# Patient Record
Sex: Female | Born: 1950
Health system: Southern US, Community
[De-identification: ages and names within clinical notes are randomized; demographics above are authoritative.]

## PROBLEM LIST (undated history)

## (undated) DIAGNOSIS — F329 Major depressive disorder, single episode, unspecified: Secondary | ICD-10-CM

## (undated) DIAGNOSIS — F32A Depression, unspecified: Secondary | ICD-10-CM

## (undated) DIAGNOSIS — M797 Fibromyalgia: Secondary | ICD-10-CM

## (undated) DIAGNOSIS — M1990 Unspecified osteoarthritis, unspecified site: Secondary | ICD-10-CM

## (undated) DIAGNOSIS — G479 Sleep disorder, unspecified: Secondary | ICD-10-CM

## (undated) DIAGNOSIS — G5600 Carpal tunnel syndrome, unspecified upper limb: Secondary | ICD-10-CM

## (undated) DIAGNOSIS — E785 Hyperlipidemia, unspecified: Secondary | ICD-10-CM

## (undated) DIAGNOSIS — B0229 Other postherpetic nervous system involvement: Secondary | ICD-10-CM

## (undated) DIAGNOSIS — Z720 Tobacco use: Secondary | ICD-10-CM

## (undated) HISTORY — PX: COLONOSCOPY: SHX174

## (undated) HISTORY — DX: Tobacco use: Z72.0

## (undated) HISTORY — PX: OTHER SURGICAL HISTORY: SHX169

## (undated) HISTORY — DX: Unspecified osteoarthritis, unspecified site: M19.90

## (undated) HISTORY — DX: Sleep disorder, unspecified: G47.9

## (undated) HISTORY — DX: Carpal tunnel syndrome, unspecified upper limb: G56.00

## (undated) HISTORY — DX: Fibromyalgia: M79.7

## (undated) HISTORY — DX: Other postherpetic nervous system involvement: B02.29

---

## 1990-03-03 HISTORY — PX: ABDOMINAL HYSTERECTOMY: SHX81

## 1990-03-03 HISTORY — PX: APPENDECTOMY: SHX54

## 1990-03-03 HISTORY — PX: OTHER SURGICAL HISTORY: SHX169

## 1999-06-02 ENCOUNTER — Emergency Department (HOSPITAL_COMMUNITY): Admission: EM | Admit: 1999-06-02 | Discharge: 1999-06-02 | Payer: Self-pay | Admitting: Emergency Medicine

## 1999-06-02 ENCOUNTER — Encounter: Payer: Self-pay | Admitting: Emergency Medicine

## 2001-11-25 ENCOUNTER — Encounter: Payer: Self-pay | Admitting: Family Medicine

## 2001-11-25 ENCOUNTER — Encounter: Admission: RE | Admit: 2001-11-25 | Discharge: 2001-11-25 | Payer: Self-pay | Admitting: Family Medicine

## 2002-07-06 ENCOUNTER — Other Ambulatory Visit: Admission: RE | Admit: 2002-07-06 | Discharge: 2002-07-06 | Payer: Self-pay | Admitting: Family Medicine

## 2002-07-06 ENCOUNTER — Encounter: Payer: Self-pay | Admitting: Family Medicine

## 2002-08-05 ENCOUNTER — Ambulatory Visit (HOSPITAL_COMMUNITY): Admission: RE | Admit: 2002-08-05 | Discharge: 2002-08-05 | Payer: Self-pay | Admitting: Gastroenterology

## 2004-02-22 ENCOUNTER — Ambulatory Visit: Payer: Self-pay | Admitting: Family Medicine

## 2004-06-03 ENCOUNTER — Ambulatory Visit: Payer: Self-pay | Admitting: Family Medicine

## 2004-11-26 ENCOUNTER — Ambulatory Visit: Payer: Self-pay | Admitting: Family Medicine

## 2005-01-27 ENCOUNTER — Ambulatory Visit: Payer: Self-pay | Admitting: Family Medicine

## 2005-08-01 ENCOUNTER — Ambulatory Visit: Payer: Self-pay | Admitting: Family Medicine

## 2006-01-27 ENCOUNTER — Ambulatory Visit: Payer: Self-pay | Admitting: Family Medicine

## 2006-08-24 ENCOUNTER — Ambulatory Visit: Payer: Self-pay | Admitting: Family Medicine

## 2006-12-09 ENCOUNTER — Telehealth: Payer: Self-pay | Admitting: Family Medicine

## 2006-12-15 ENCOUNTER — Encounter: Payer: Self-pay | Admitting: Family Medicine

## 2006-12-15 DIAGNOSIS — N809 Endometriosis, unspecified: Secondary | ICD-10-CM | POA: Insufficient documentation

## 2006-12-15 DIAGNOSIS — G5600 Carpal tunnel syndrome, unspecified upper limb: Secondary | ICD-10-CM | POA: Insufficient documentation

## 2006-12-15 DIAGNOSIS — F17200 Nicotine dependence, unspecified, uncomplicated: Secondary | ICD-10-CM | POA: Insufficient documentation

## 2006-12-15 DIAGNOSIS — M797 Fibromyalgia: Secondary | ICD-10-CM | POA: Insufficient documentation

## 2006-12-15 DIAGNOSIS — B0229 Other postherpetic nervous system involvement: Secondary | ICD-10-CM | POA: Insufficient documentation

## 2006-12-15 DIAGNOSIS — Z720 Tobacco use: Secondary | ICD-10-CM | POA: Insufficient documentation

## 2006-12-15 DIAGNOSIS — M1990 Unspecified osteoarthritis, unspecified site: Secondary | ICD-10-CM | POA: Insufficient documentation

## 2007-01-13 ENCOUNTER — Telehealth: Payer: Self-pay | Admitting: Family Medicine

## 2007-01-18 ENCOUNTER — Ambulatory Visit: Payer: Self-pay | Admitting: Family Medicine

## 2007-01-18 DIAGNOSIS — G479 Sleep disorder, unspecified: Secondary | ICD-10-CM | POA: Insufficient documentation

## 2007-01-29 ENCOUNTER — Ambulatory Visit: Payer: Self-pay | Admitting: Family Medicine

## 2007-02-01 ENCOUNTER — Ambulatory Visit: Payer: Self-pay | Admitting: Family Medicine

## 2007-02-02 LAB — CONVERTED CEMR LAB
ALT: 14 units/L (ref 0–35)
BUN: 11 mg/dL (ref 6–23)
Basophils Relative: 0.9 % (ref 0.0–1.0)
Bilirubin, Direct: 0.1 mg/dL (ref 0.0–0.3)
Creatinine, Ser: 0.9 mg/dL (ref 0.4–1.2)
Direct LDL: 130 mg/dL
Eosinophils Relative: 4.6 % (ref 0.0–5.0)
GFR calc Af Amer: 83 mL/min
HCT: 42.6 % (ref 36.0–46.0)
HDL: 77.3 mg/dL (ref >39.0)
Hemoglobin: 14.6 g/dL (ref 12.0–15.0)
Lymphocytes Relative: 40.5 % (ref 12.0–46.0)
MCHC: 34.2 g/dL (ref 30.0–36.0)
MCV: 89.4 fL (ref 78.0–100.0)
Neutrophils Relative %: 47.2 % (ref 43.0–77.0)
RBC: 4.76 M/uL (ref 3.87–5.11)
RDW: 13.1 % (ref 11.5–14.6)
VLDL: 12 mg/dL (ref 0–40)
WBC: 6.2 10*3/uL (ref 4.5–10.5)

## 2007-02-08 ENCOUNTER — Ambulatory Visit: Payer: Self-pay | Admitting: Family Medicine

## 2007-03-19 ENCOUNTER — Telehealth: Payer: Self-pay | Admitting: Family Medicine

## 2007-05-11 ENCOUNTER — Ambulatory Visit: Payer: Self-pay | Admitting: Family Medicine

## 2007-05-11 LAB — CONVERTED CEMR LAB
Nitrite: NEGATIVE
pH: 6.5

## 2007-05-12 ENCOUNTER — Telehealth: Payer: Self-pay | Admitting: Family Medicine

## 2007-05-18 ENCOUNTER — Telehealth: Payer: Self-pay | Admitting: Family Medicine

## 2007-07-13 ENCOUNTER — Ambulatory Visit: Payer: Self-pay | Admitting: Family Medicine

## 2007-07-13 DIAGNOSIS — L723 Sebaceous cyst: Secondary | ICD-10-CM | POA: Insufficient documentation

## 2007-09-10 ENCOUNTER — Telehealth (INDEPENDENT_AMBULATORY_CARE_PROVIDER_SITE_OTHER): Payer: Self-pay | Admitting: *Deleted

## 2007-10-01 ENCOUNTER — Telehealth (INDEPENDENT_AMBULATORY_CARE_PROVIDER_SITE_OTHER): Payer: Self-pay | Admitting: *Deleted

## 2007-12-24 ENCOUNTER — Telehealth: Payer: Self-pay | Admitting: Family Medicine

## 2008-01-25 ENCOUNTER — Telehealth: Payer: Self-pay | Admitting: Family Medicine

## 2008-04-19 ENCOUNTER — Ambulatory Visit: Payer: Self-pay | Admitting: Family Medicine

## 2008-05-30 ENCOUNTER — Telehealth: Payer: Self-pay | Admitting: Family Medicine

## 2008-07-07 ENCOUNTER — Telehealth: Payer: Self-pay | Admitting: Family Medicine

## 2008-12-08 ENCOUNTER — Telehealth: Payer: Self-pay | Admitting: Family Medicine

## 2009-01-05 ENCOUNTER — Telehealth: Payer: Self-pay | Admitting: Family Medicine

## 2009-05-03 ENCOUNTER — Telehealth: Payer: Self-pay | Admitting: Family Medicine

## 2009-07-03 ENCOUNTER — Encounter: Payer: Self-pay | Admitting: Family Medicine

## 2009-07-06 ENCOUNTER — Telehealth: Payer: Self-pay | Admitting: Family Medicine

## 2009-07-06 ENCOUNTER — Ambulatory Visit: Payer: Self-pay | Admitting: Family Medicine

## 2009-07-06 ENCOUNTER — Encounter: Admission: RE | Admit: 2009-07-06 | Discharge: 2009-07-06 | Payer: Self-pay | Admitting: Family Medicine

## 2009-07-06 DIAGNOSIS — IMO0002 Reserved for concepts with insufficient information to code with codable children: Secondary | ICD-10-CM | POA: Insufficient documentation

## 2009-07-06 DIAGNOSIS — M25539 Pain in unspecified wrist: Secondary | ICD-10-CM | POA: Insufficient documentation

## 2009-07-06 DIAGNOSIS — M159 Polyosteoarthritis, unspecified: Secondary | ICD-10-CM | POA: Insufficient documentation

## 2009-07-06 DIAGNOSIS — M25519 Pain in unspecified shoulder: Secondary | ICD-10-CM | POA: Insufficient documentation

## 2009-07-06 IMAGING — CR DG WRIST COMPLETE 3+V*L*
3 series · 3 of 3 positions shown · non-contrast
Comparison: [HOSPITAL] at [REDACTED] [HOSPITAL] left hand
radiographs [DATE].

CLINICAL DATA: Fall injury [DATE] with medial left wrist pain
and swelling.

LEFT WRIST - COMPLETE 3+ VIEW

[view not recorded (1 of 3)]
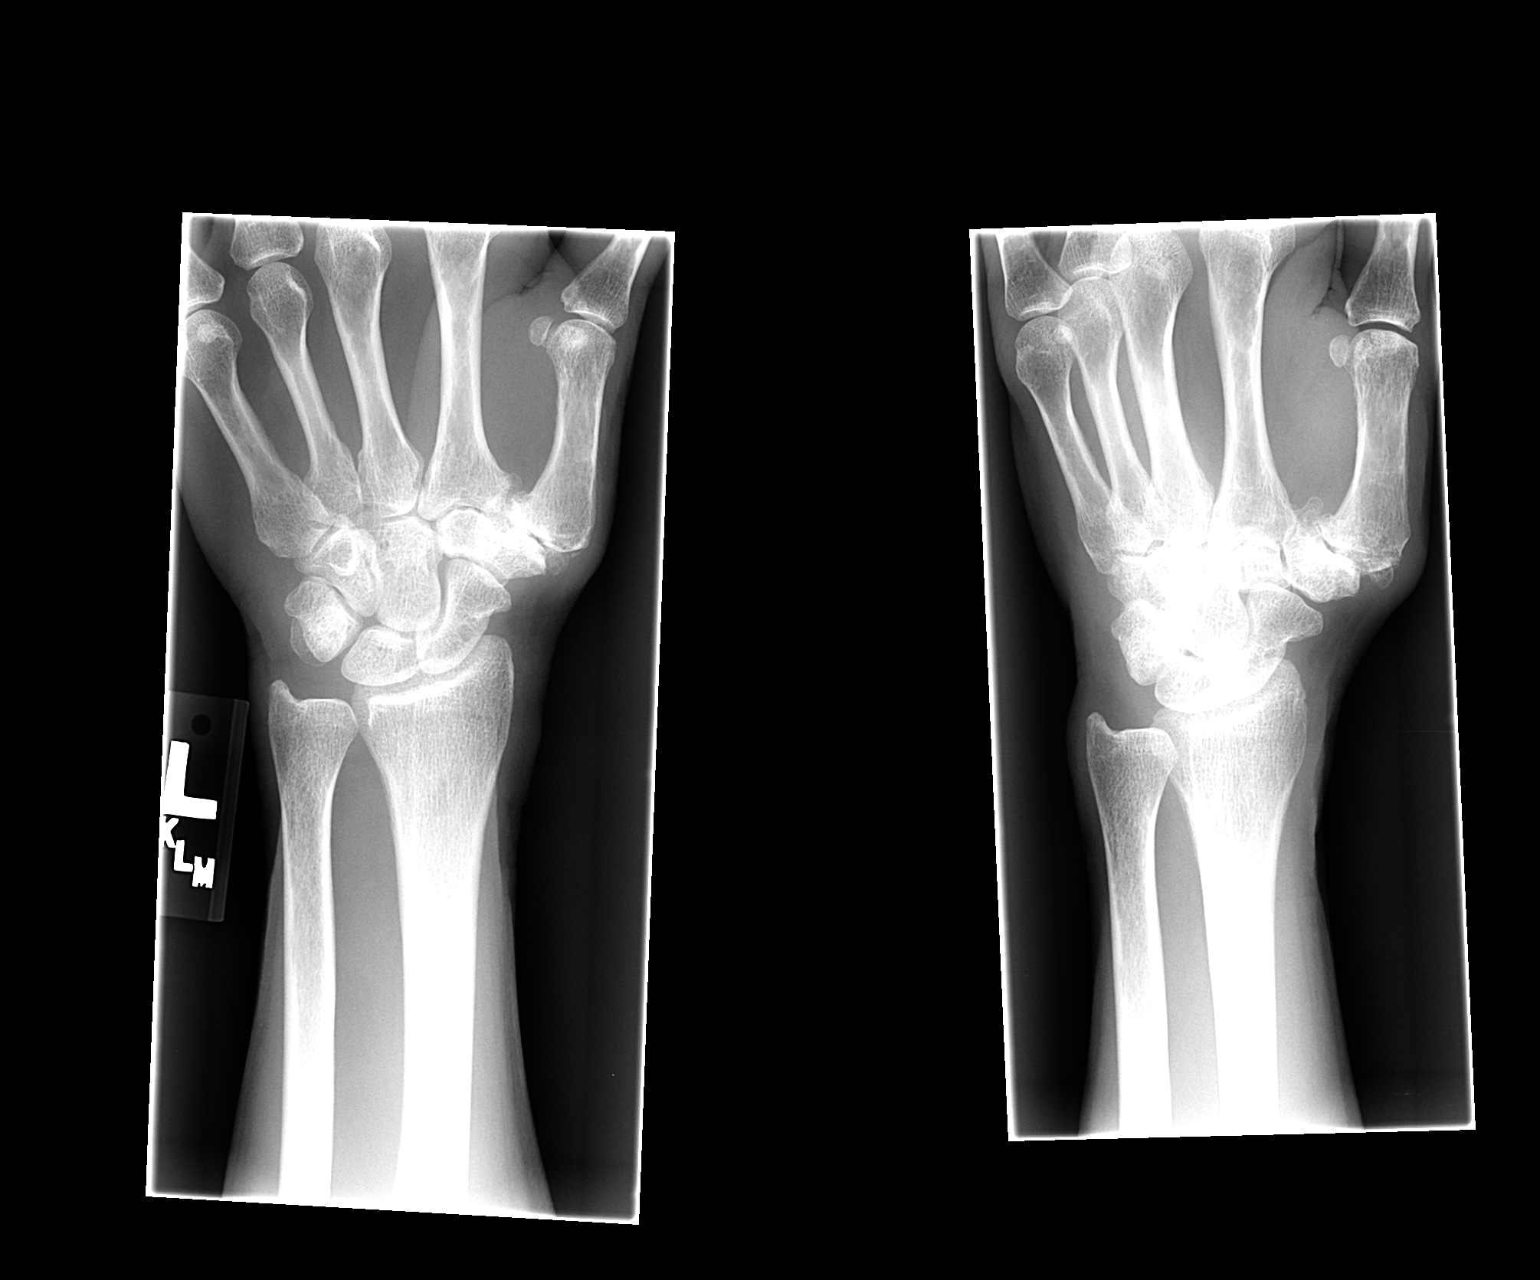

[view not recorded (2 of 3)]
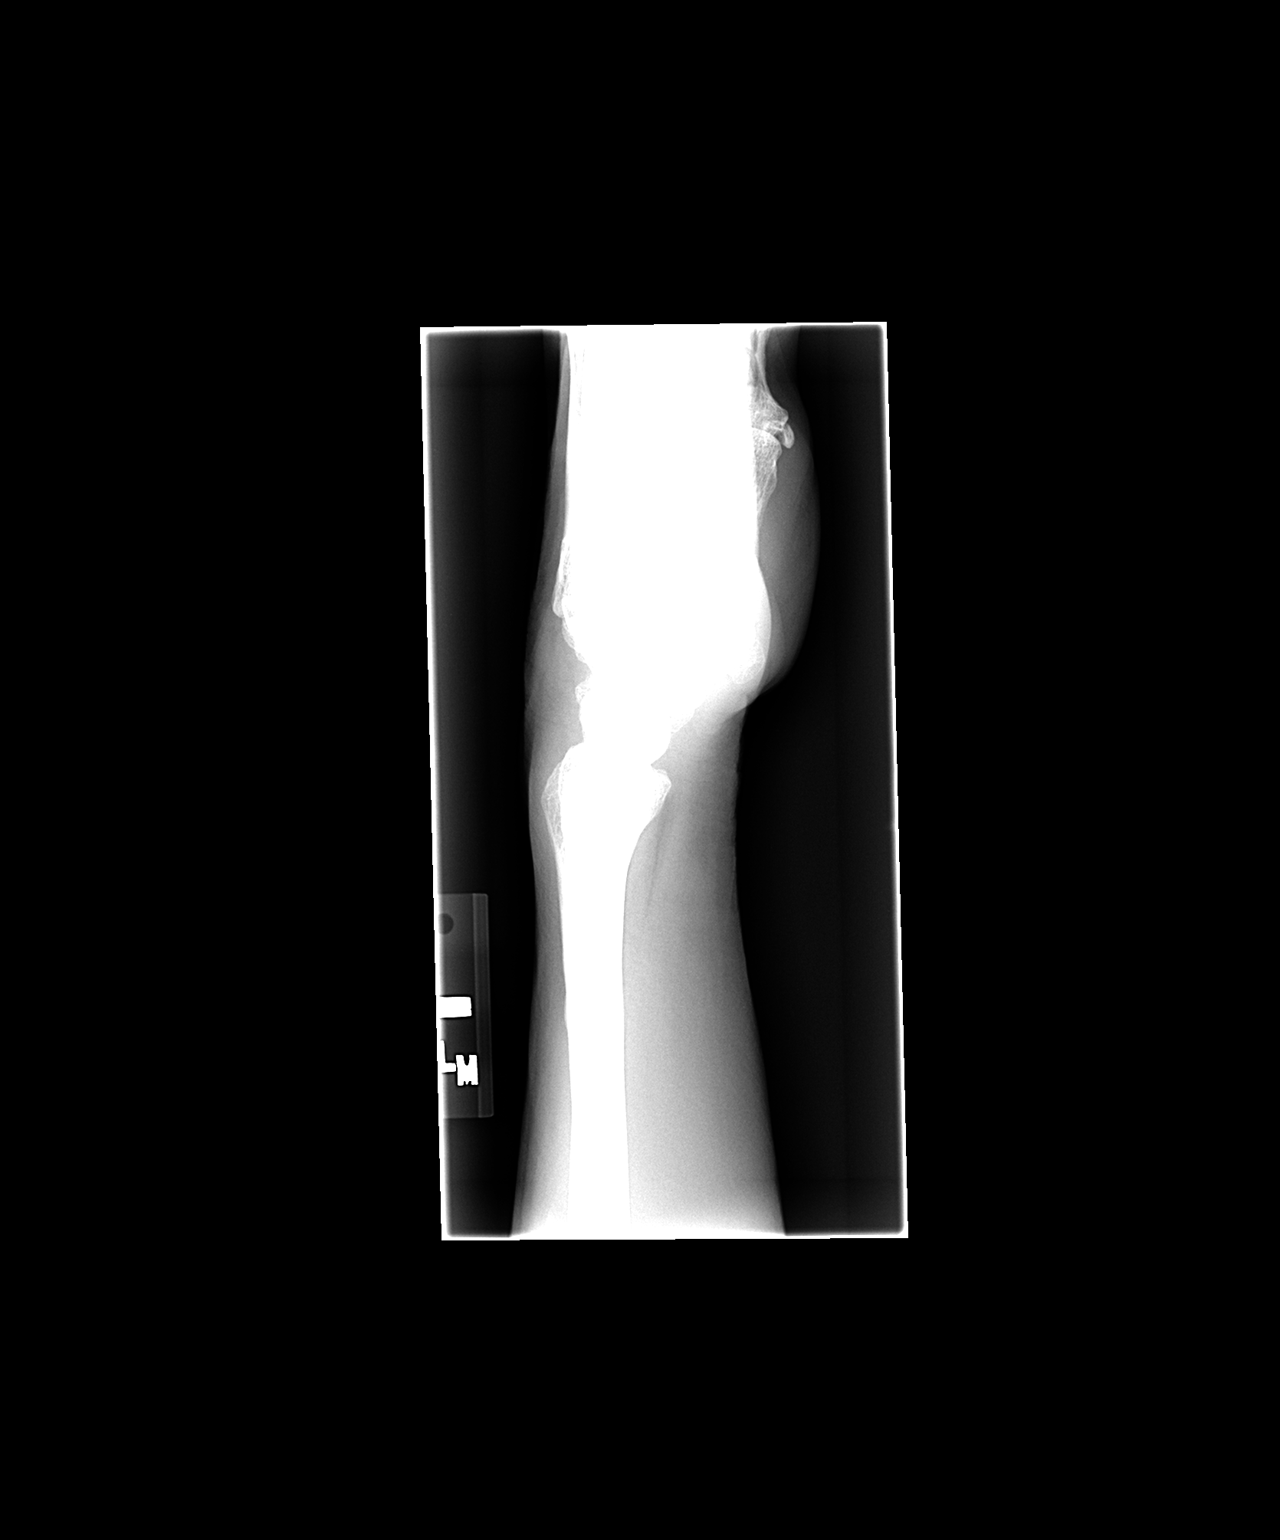

[view not recorded (3 of 3)]
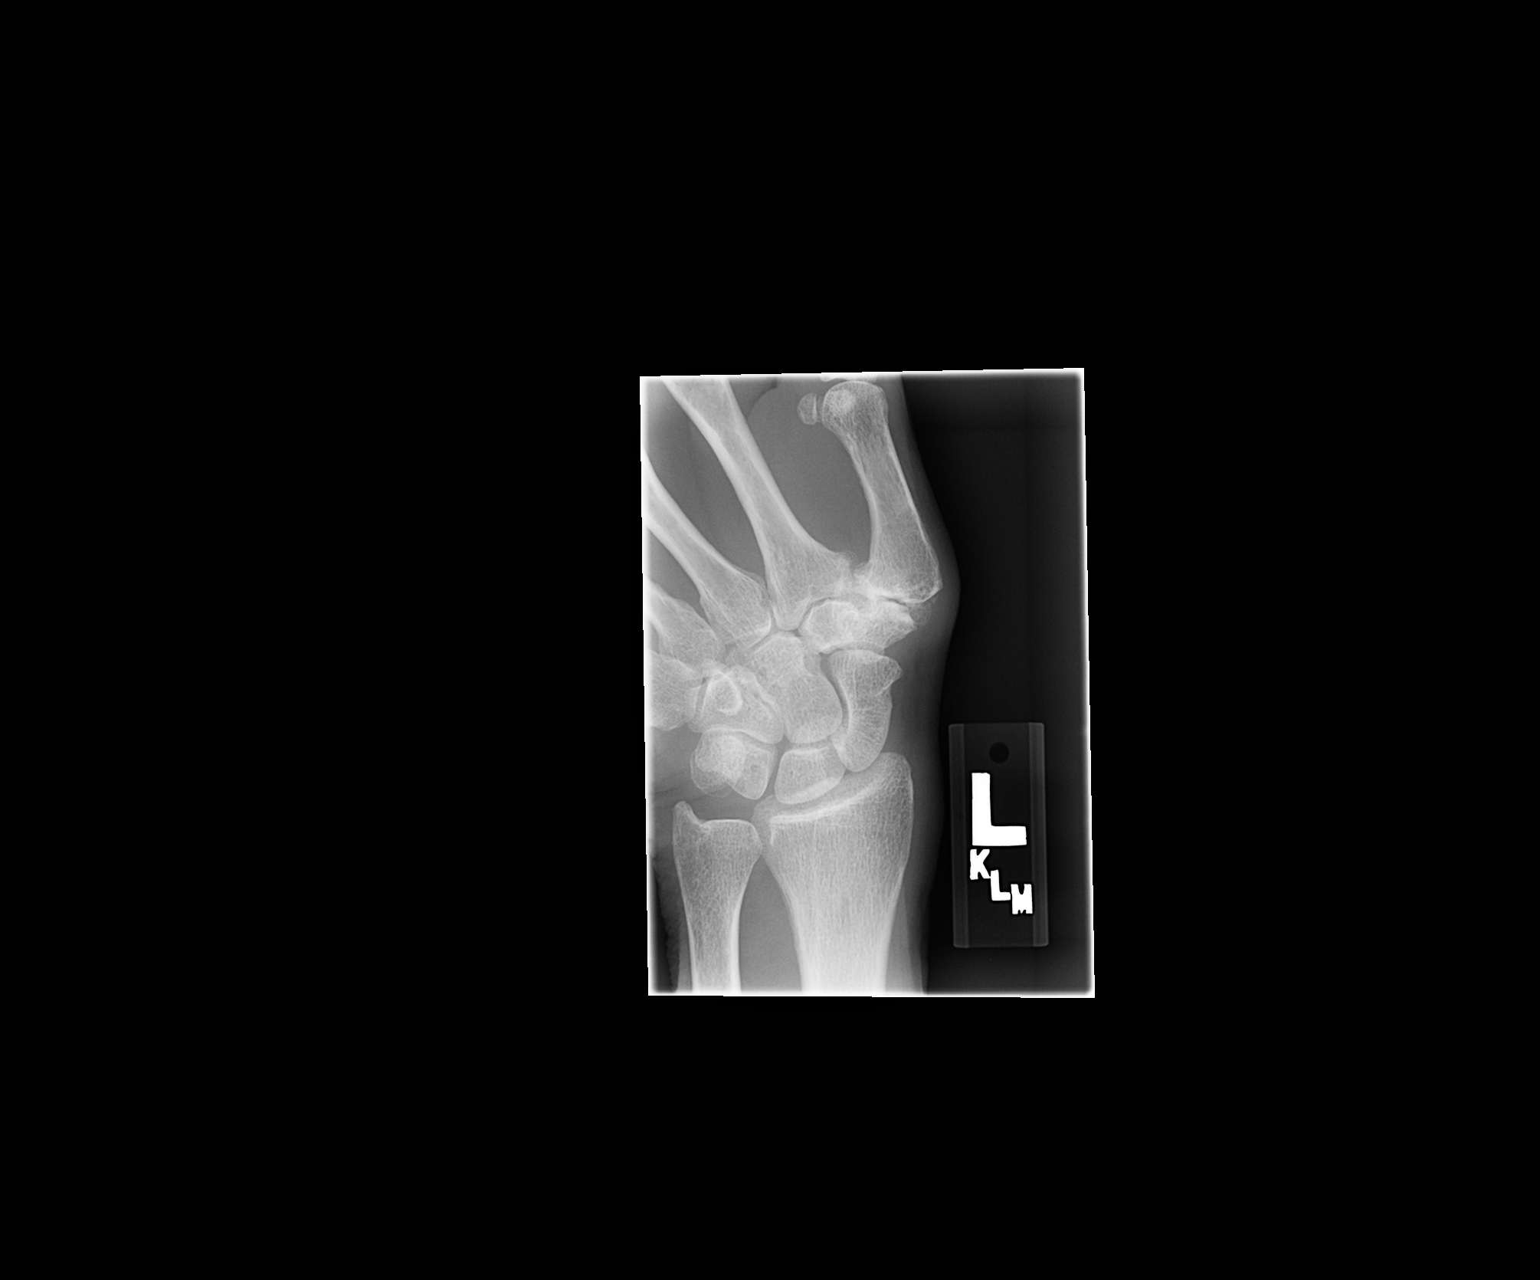

[3 of 3 positions shown; findings below may reference images not displayed]

FINDINGS: No acute fracture, subluxation, dislocation or radiopaque
foreign bodies seen.  Advanced left first metacarpal carpal
osteoarthritis findings noted.
IMPRESSION: 1.  No acute findings.
2.  Advanced left first metacarpal carpal osteoarthritis.

## 2009-08-06 ENCOUNTER — Telehealth: Payer: Self-pay | Admitting: Family Medicine

## 2009-11-21 ENCOUNTER — Ambulatory Visit: Payer: Self-pay | Admitting: Family Medicine

## 2009-11-21 DIAGNOSIS — R634 Abnormal weight loss: Secondary | ICD-10-CM | POA: Insufficient documentation

## 2010-01-18 ENCOUNTER — Telehealth: Payer: Self-pay | Admitting: Family Medicine

## 2010-02-18 ENCOUNTER — Telehealth: Payer: Self-pay | Admitting: Family Medicine

## 2010-03-12 ENCOUNTER — Telehealth: Payer: Self-pay | Admitting: Family Medicine

## 2010-03-22 ENCOUNTER — Ambulatory Visit: Admit: 2010-03-22 | Payer: Self-pay | Admitting: Family Medicine

## 2010-04-02 NOTE — Progress Notes (Signed)
Summary: refill request for ultram  Phone Note Refill Request Message from:  Fax from Pharmacy  Refills Requested: Medication #1:  ULTRAM ER 300 MG  TB24 Take 1 tablet by mouth once a day prn   Last Refilled: 12/20/2009 Faxed request from [facility].  Initial call taken by: Lowella Petties CMA, AAMA,  January 18, 2010 12:32 PM  Follow-up for Phone Call        px written on EMR for call in  Follow-up by: Judith Part MD,  January 18, 2010 1:02 PM  Additional Follow-up for Phone Call Additional follow up Details #1::        Medication phoned to [facility] pharmacy as instructed. [name] LPN  January 18, 2010 2:32 PM     New/Updated Medications: ULTRAM ER 300 MG  TB24 (TRAMADOL HCL) Take 1 tablet by mouth once a day prn Prescriptions: ULTRAM ER 300 MG  TB24 (TRAMADOL HCL) Take 1 tablet by mouth once a day prn  #30 x 5   Entered and Authorized by:   Judith Part MD   Signed by:   [name] LPN on 16/12/9602   Method used:   Telephoned to ...       [facility]* (retail)       [address]       [place], [place]  [address]       Ph: [phone]       Fax: [phone]   RxID:   [id number]

## 2010-04-02 NOTE — Assessment & Plan Note (Signed)
Summary: COLD OR ALLERGY??? CHEST IS HURTING??? COUGHING UP YELLOW MUC...   Vital Signs:  Patient profile:   60 year old female Height:      65 inches Weight:      126.50 pounds BMI:     21.13 Temp:     98.5 degrees F oral Pulse rate:   72 / minute Pulse rhythm:   regular BP sitting:   110 / 80  (left arm) Cuff size:   regular  Vitals Entered By: [name] CMA Duncan Dull) (November 21, 2009 2:01 PM) CC: ? cold, allergies, congestion   History of Present Illness: sick for about 2 weeks or more  started with flu like symptoms - but without fever (just achey)  then started coughing yellow phlegm up -- and now chest burns and hurts  taking mucinex day/ mucinex DM at night -- for 2 weeks some wheeze at times  real junky sounding cough   some stuffy nose throat is sore  ears are ok    is still smoking - but less than she used to 1/2 ppd    ? fibro kicking up - is sore all over   some nausea no vom or diarrhea   occ gets a sharp pain in R hand and it goes numb for a few seconds   lost 10 lb since june - thinks it is from stress - not eating as much  husb has prostate cancer - this is stable now in remission it had spread to his bladder   Allergies: 1)  ! Tylenol  Past History:  Past Medical History: Last updated: 07/06/2009 Osteoarthritis fibromyalgia endometriosis tab abuse  post herpetic neuralgia  sleep disorder carpal tunnel  Past Surgical History: Last updated: December 18, 2006 Hysterectomy- total, endometriosis (1992) Appendectomy (1992) Bladder tack Colonoscopy- anal polyps, melanosis coli  Family History: Last updated: 12-18-2006 Father: deceased- MI age 87 Mother: deceased- MI age 73 Siblings: 6 brothers- 1 with crippling arthritis, 4 sisters- 1 with crippling arthritis  Social History: Last updated: 11/21/2009 Marital Status: Married Children:  Occupation: [employer]--caf[employer]ia  3 and 1/2 hrs/d husb has prostate ca in remision    smokes 1/2ppd   Risk Factors: Smoking Status: current (18-Dec-2006) Packs/Day: 1 pack every 2-3d (04/19/2008)  Social History: Marital Status: Married Children:  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 Eyes:  Denies blurring and eye irritation. ENT:  Complains of nasal congestion, sinus pressure, and sore throat; denies earache. CV:  Denies chest pain or discomfort and palpitations. Resp:  Complains of cough, sputum productive, and wheezing. GI:  Denies diarrhea, nausea, and vomiting. Derm:  Denies itching, lesion(s), poor wound healing, and rash. Heme:  Denies abnormal bruising and bleeding.  Physical Exam  General:  Well-developed,well-nourished,in no acute distress; alert,appropriate and cooperative throughout examination Head:  normocephalic, atraumatic, and no abnormalities observed.  no sinus tenderness Eyes:  vision grossly intact, pupils equal, pupils round, pupils reactive to light, and no injection.   Ears:  R ear normal and L ear normal.   Nose:  nares are injected and congested bilaterally  Mouth:  pharynx pink and moist, no erythema, and no exudates.   Neck:  No deformities, masses, or tenderness noted. Chest Wall:  some mild ant chest wall tenderness  Lungs:  CTA with distant bs that are harsh at bases  scant occas rhonchi no wheeze  except on forced exp Heart:  Normal rate and regular rhythm. S1 and S2 normal without gallop, murmur, click, rub or other extra sounds. Abdomen:  Bowel sounds positive,abdomen soft and non-tender without masses, organomegaly or hernias noted. Skin:  Intact without suspicious lesions or rashes Cervical Nodes:  No lymphadenopathy noted Psych:  normal affect, talkative and pleasant    Impression & Recommendations:  Problem # 1:  BRONCHITIS, ACUTE (ICD-466.0) Assessment New for over 2 wk  with prod cough in smoker thankfully - reactive airways are mild cover with levaquin and get cxr  recommend sympt care- see pt instructions   Her updated medication list for this problem includes:    Levaquin 500 Mg Tabs (Levofloxacin) .Marland Kitchen... 1 by mouth once daily for 10 days    Guaifenesin-codeine 100-10 Mg/26ml Soln (Guaifenesin-codeine) .Marland Kitchen... 1-2 teaspoons by mouth up to every 4-6 hours as needed cough  watch for sedation    Ventolin Hfa 108 (90 Base) Mcg/act Aers (Albuterol sulfate) .Marland Kitchen... 2 puffs up to every 4 hours as needed wheeze  Orders: T-2 View CXR (71020TC)  Problem # 2:  WEIGHT LOSS, RECENT (ICD-783.21) per pt from stress in light of this and smoking- cxr today Orders: T-2 View CXR (71020TC)  Complete Medication List: 1)  Lyrica 75 Mg Caps (Pregabalin) .... Take 1 capsule by mouth once a day 2)  Remifemin  .... Take 1 tablet by mouth once a day 3)  Cymbalta 60 Mg Cpep (Duloxetine hcl) .... Take 1 tablet by mouth once a day 4)  Ultram Er 300 Mg Tb24 (Tramadol hcl) .... Take 1 tablet by mouth once a day prn 5)  Adult Aspirin Low Strength 81 Mg Tbdp (Aspirin) .... Take 1 tablet by mouth once a day 6)  Levaquin 500 Mg Tabs (Levofloxacin) .Marland Kitchen.. 1 by mouth once daily for 10 days 7)  Guaifenesin-codeine 100-10 Mg/91ml Soln (Guaifenesin-codeine) .Marland Kitchen.. 1-2 teaspoons by mouth up to every 4-6 hours as needed cough  watch for sedation 8)  Ventolin Hfa 108 (90 Base) Mcg/act Aers (Albuterol sulfate) .... 2 puffs up to every 4 hours as needed wheeze  Patient Instructions: 1)  chest x ray today  2)  work on quitting smoking  3)  take the levaquin for bronchitis 4)  cough syrup is sedating - use caution  5)  inhaler as needed 6)  please update me if worse or not improved next week  Prescriptions: VENTOLIN HFA 108 (90 BASE) MCG/ACT AERS (ALBUTEROL SULFATE) 2 puffs up to every 4 hours as needed wheeze  #1 mdi x 1   Entered and Authorized by:   Judith Part MD   Signed by:   Judith Part MD on 11/21/2009   Method used:   Print then Give to Patient   RxID:   [id number] GUAIFENESIN-CODEINE 100-10 MG/5ML SOLN (GUAIFENESIN-CODEINE) 1-2 teaspoons by mouth up to every 4-6 hours as needed cough  watch for sedation  #120cc x 0   Entered and Authorized by:   Judith Part MD   Signed by:   Judith Part MD on 11/21/2009   Method used:   Print then Give to Patient   RxID:   [id number] LEVAQUIN 500 MG TABS (LEVOFLOXACIN) 1 by mouth once daily for 10 days  #10 x 0   Entered and Authorized by:   Judith Part MD   Signed by:   Judith Part MD on 11/21/2009   Method used:   Print then Give to Patient  RxID:   [id number]   Current Allergies (reviewed today): ! TYLENOL

## 2010-04-02 NOTE — Progress Notes (Signed)
Summary: refill requests for lyrica, ultram  Phone Note Refill Request Message from:  Fax from Pharmacy  Refills Requested: Medication #1:  ULTRAM ER 300 MG  TB24 Take 1 tablet by mouth once a day prn   Last Refilled: 07/06/2009  Medication #2:  LYRICA 75 MG CAPS Take 1 capsule by mouth once a day   Last Refilled: 07/06/2009 Faxed requests from [facility], [phone].  Initial call taken by: Lowella Petties CMA,  August 06, 2009 2:20 PM  Follow-up for Phone Call        controlled substance. Dr. Milinda Antis back in a few days.  per our current plan of care with controlled substances, will forward to Dr. Milinda Antis.  Follow-up by: Hannah Beat MD,  August 06, 2009 2:29 PM  Additional Follow-up for Phone Call Additional follow up Details #1::        px written on EMR for call in  Medication phoned to pharmacy.   Additional Follow-up by: Delilah Shan CMA (AAMA),  August 07, 2009 11:11 AM    New/Updated Medications: LYRICA 75 MG CAPS (PREGABALIN) Take 1 capsule by mouth once a day ULTRAM ER 300 MG  TB24 (TRAMADOL HCL) Take 1 tablet by mouth once a day prn Prescriptions: ULTRAM ER 300 MG  TB24 (TRAMADOL HCL) Take 1 tablet by mouth once a day prn  #30 x 5   Entered by:   Delilah Shan CMA (AAMA)   Authorized by:   Judith Part MD   Signed by:   Delilah Shan CMA (AAMA) on 08/07/2009   Method used:   Telephoned to ...       [facility]* (retail)       [address]       [place], [place]  [address]       Ph: [phone]       Fax: [phone]   RxID:   [id number] LYRICA 75 MG CAPS (PREGABALIN) Take 1 capsule by mouth once a day  #30 x 5   Entered by:   Delilah Shan CMA (AAMA)   Authorized by:   Judith Part MD   Signed by:   Delilah Shan CMA (AAMA) on 08/07/2009   Method used:   Telephoned to ...       [facility]* (retail)       [address]       [place], [place]  [address]       Ph: [phone]       Fax: [phone]   RxID:   [id number] ULTRAM ER 300 MG  TB24  (TRAMADOL HCL) Take 1 tablet by mouth once a day prn  #30 x 5   Entered and Authorized by:   Judith Part MD   Signed by:   Judith Part MD on 08/07/2009   Method used:   Telephoned to ...       [facility]* (retail)       [address]       [place], [place]  [address]       Ph: [phone]       Fax: [phone]   RxID:   [id number] LYRICA 75 MG CAPS (PREGABALIN) Take 1 capsule by mouth once a day  #30 x 5   Entered and Authorized by:   Judith Part MD   Signed by:   Judith Part MD on 08/07/2009   Method used:   Telephoned to ...       [facility]* (retail)  [address] [place], [place]  [address]       Ph: [phone]       Fax: [phone]   RxID:   [id number]

## 2010-04-02 NOTE — Progress Notes (Signed)
Summary: Rx Ultram & Lyrica  Phone Note Refill Request Call back at [phone] Message from:  [facility] on May 03, 2009 2:46 PM  Refills Requested: Medication #1:  LYRICA 75 MG CAPS Take 1 capsule by mouth once a day   Last Refilled: 03/15/2009  Medication #2:  Janean Sark ER 300 MG  TB24 Take 1 tablet by mouth once a day prn   Last Refilled: 01/05/2009 Received faxed refill request, please advise   Method Requested: Telephone to Pharmacy Initial call taken by: [name] CMA Duncan Dull),  May 03, 2009 2:46 PM  Follow-up for Phone Call        please sched appt with me to check in this spring Follow-up by: Judith Part MD,  May 03, 2009 3:45 PM  Additional Follow-up for Phone Call Additional follow up Details #1::        Unable to reach pt by phone to schedule appt. Left message at [facility] for pt to call for appt.Medication phoned to [facility]  pharmacy as instructed. [name] LPN  May 04, 4538 3:53 PM     New/Updated Medications: LYRICA 75 MG CAPS (PREGABALIN) Take 1 capsule by mouth once a day Prescriptions: ULTRAM ER 300 MG  TB24 (TRAMADOL HCL) Take 1 tablet by mouth once a day prn  #30 x 2   Entered and Authorized by:   Judith Part MD   Signed by:   [name] LPN on 98/01/9146   Method used:   Telephoned to ...       [facility]* (retail)       [address]       [place], [place]  [address]       Ph: [phone]       Fax: [phone]   RxID:   [id number] LYRICA 75 MG CAPS (PREGABALIN) Take 1 capsule by mouth once a day  #30 x 2   Entered and Authorized by:   Judith Part MD   Signed by:   [name] LPN on 03/47/4259   Method used:   Telephoned to ...       [facility]* (retail)       [address]       [place], [place]  [address]       Ph: [phone]       Fax: [phone]   RxID:   [id number]   Appended Document: Rx Ultram & Lyrica [name] from [facility] called and said pt had been getting Lyrica #60 taking one capsule two times a day.   Our records indicate all refills have been #30 taking one daily. I spoke with pt and she said she takes one daily. I called [name] and she said it looked like she had been getting #60 and filling every 2 months. Rx was left as #30 taking one daily. This message is for your info and approval. Thank you.

## 2010-04-02 NOTE — Progress Notes (Signed)
Summary: cell phone number  Phone Note Call from Patient Call back at Home Phone [phone]   Caller: Patient Call For: Judith Part MD Summary of Call: Patient called asking about the x-rays that were done this morning. Please advise. Initial call taken by: Melody Comas,  Jul 06, 2009 11:39 AM  Follow-up for Phone Call        thankfully - no fractures seen on x rays but she does have some arthritis use some ice as we discussed over the weekend and update me next week if pain and swelling is not improved further  worst arthritis is in L wrist and hand -- so if stiffness starts to worsen may want to have her see a specialist in the future   Follow-up by: Judith Part MD,  Jul 06, 2009 4:07 PM  Additional Follow-up for Phone Call Additional follow up Details #1::        Patient Advised.  Additional Follow-up by: Delilah Shan CMA Duncan Dull),  Jul 06, 2009 5:05 PM

## 2010-04-02 NOTE — Assessment & Plan Note (Signed)
Summary: FELL AT WORK, CUT ON NOSE AND WRIST HURT/DLO   Vital Signs:  Patient profile:   60 year old female Height:      65 inches Weight:      136.25 pounds BMI:     22.76 Temp:     98.2 degrees F oral Pulse rate:   64 / minute Pulse rhythm:   regular BP sitting:   132 / 76  (left arm) Cuff size:   regular  Vitals Entered By: [name] LPN (Jul 06, 1608 9:39 AM) CC: fell at work on 07/03/09 Seen at urgent care on [address],abrasion to rt shoulder, nose and left wrist hurts and rt hand hurts and h/a since fall   History of Present Illness: was at work - taking recyclables out -- missed back step was holding something and could not catch herself well  waist up took the biggest bit of the fall   pain is around R 1st mcp  also L wrist - ulnar side   also R shoulder  abrasion on nose with pain too  went to UC immediately -- on [address]  that was [employer] comp  doctor looked her over  x rayed thenose -- and -- that was read by radiologist  no other x rays  no stiches  gave her a muscle relaxer -- flexeril and that did not help   is achey all over  took ES excedrin and that helps some  has a mild headache ever since (hitting bridge of nose )  pressure on her nose to bend over  no nosebleed    Allergies: 1)  ! 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 Eyes:  Denies blurring and eye irritation. CV:  Denies chest pain or discomfort, palpitations, and shortness of breath with exertion. Resp:  Denies cough. GI:  Denies abdominal pain, bloody stools, and nausea. GU:  Denies dysuria and hematuria. MS:  Complains of joint pain, joint swelling, and stiffness; denies joint redness and muscle weakness. Neuro:  Complains of falling down and headaches; denies numbness and tingling; headache is mild and improving. Psych:  mood is ok. Endo:  Denies cold intolerance, excessive thirst, excessive urination, and heat intolerance. Heme:  Denies bleeding.  Physical Exam  General:  well appearing with abrasions Head:  abrasions over nose - with tenderness no other bony or sinus tenderness Eyes:  vision grossly intact, pupils equal, pupils round, and pupils reactive to light.   Nose:  no nasal discharge.  - good air exch Mouth:  pharynx pink and moist.   tougue is not bitten Neck:  No deformities, masses, or tenderness noted. nl rom without bony tenderness Chest Wall:  No deformities, masses, or tenderness noted. Lungs:  diffusely distant bs  Heart:  Normal rate and regular rhythm. S1 and S2 normal without gallop, murmur,  click, rub or other extra sounds. Abdomen:  Bowel sounds positive,abdomen soft and non-tender without masses, organomegaly or hernias noted. Msk:  R shoulder - abrasion without swelling  pain to abd over 90deg  no apprehension  minimal acromion tenderness   R hand - tenderness and swelling over 1st mcp and lateral/ dorsal hand  nl rom of fingers   L wrist - tender over distal ulna without swelling  pain on movement of wrist in all directions -but rom is full  nl grip Pulses:  R and L carotid,radial,femoral,dorsalis pedis and  posterior tibial pulses are full and equal bilaterally Neurologic:  strength normal in all extremities, sensation intact to light touch, and DTRs symmetrical and normal.   Skin:  abrasion healing R shoulder with granulation tissue and scab 2 cm oval and no s/s/ of infection  well healing scabs on bridge of nose  Cervical Nodes:  No lymphadenopathy noted Inguinal Nodes:  No significant adenopathy Psych:  normal affect, talkative and pleasant - in good spirits despite injuries   Impression & Recommendations:  Problem # 1:  SHOULDER PAIN, RIGHT (ICD-719.41) Assessment New after a fall with limited abduction and abrasion  x ray  disc pain control ice and relative rest  update with x ray The following medications were removed from the medication list:    Vicodin 5-500 Mg Tabs (Hydrocodone-acetaminophen) .Marland Kitchen... 1 at bedtime as needed cough by mouth Her updated medication list for this problem includes:    Ultram Er 300 Mg Tb24 (Tramadol hcl) .Marland Kitchen... Take 1 tablet by mouth once a day prn    Adult Aspirin Low Strength 81 Mg Tbdp (Aspirin) .Marland Kitchen... Take 1 tablet by mouth once a day  Orders: Radiology Referral (Radiology)  Problem # 2:  HAND PAIN, BILATERAL (ICD-729.5) Assessment: New after trauma- some swelling and pain with grip  xrays and update  ice and relative rest  Orders: Radiology Referral (Radiology)  Problem # 3:  WRIST PAIN, BILATERAL (WJX-914.78) Assessment: New after fall - with swelling and somewhat limited rom/ some lat pt tenderness x rays and update  pain control is adequate disc ice and elevation Orders: Radiology Referral (Radiology)  Problem # 4:  ABRASION, FACE (ICD-910.0) Assessment: New healing/ as well as one on shoulder recommend sympt care- see pt instructions   update if signs of infx/ reviewed  Td updated   Complete Medication List: 1)  Lyrica 75 Mg Caps (Pregabalin) .... Take 1 capsule by mouth once a day 2)  Remifemin  .... Take 1 tablet by mouth  once a day 3)  Cymbalta 60 Mg Cpep (Duloxetine hcl) .... Take 1 tablet by mouth once a day 4)  Ultram Er 300 Mg Tb24 (Tramadol hcl) .... Take 1 tablet by mouth once a day prn 5)  Adult Aspirin Low Strength 81 Mg Tbdp (Aspirin) .... Take 1 tablet by mouth once a day  Patient Instructions: 1)  use triple antibiotic ointment or polysporin on abrasions- they look good  2)  use antibacterial soap and water for abrasions  3)  we will send you for x rays when you check out  4)  use ice on the sore areas  5)  continue current medicines 6)  let me know if headache worsens or vomiting or dizziness -- if that occurs -- go to ER over weekend  7)  will make plan when x rays return   Current Allergies (reviewed today): ! TYLENOL   Tetanus/Td Immunization History:    Tetanus/Td # 1:  Td (03/04/2007)

## 2010-04-02 NOTE — Progress Notes (Signed)
Summary: lyrica refill  Phone Note From Pharmacy   Caller: [facility] Call For: dr Lennon Boutwell  Summary of Call: faxed refill request for lyrica Initial call taken by: Lowella Petties,  May 18, 2007 8:17 AM  Follow-up for Phone Call        px written on EMR for call in  Follow-up by: Judith Part MD,  May 18, 2007 8:26 AM  Additional Follow-up for Phone Call Additional follow up Details #1::        called to [facility] Additional Follow-up by: Lowella Petties,  May 18, 2007 10:15 AM      Prescriptions: LYRICA 75 MG CAPS (PREGABALIN) Take 1 capsule by mouth once a day  #30 x 5   Entered and Authorized by:   Judith Part MD   Signed by:   Judith Part MD on 05/18/2007   Method used:   Telephoned to ...         RxID:   [id number]

## 2010-04-04 NOTE — Progress Notes (Signed)
Summary: Cymbalta 60mg   Phone Note Refill Request Call back at [phone] Message from:  [facility] on March 12, 2010 9:37 AM  Refills Requested: Medication #1:  CYMBALTA 60 MG  CPEP Take 1 tablet by mouth once a day   Last Refilled: 02/09/2010 [facility] faxed refill for Cymbalta. Last refill 02/09/10. I did not know if pt needed an appt. i did not see labs since 2008 unless I am in error. Please advise.    Method Requested: Electronic Initial call taken by: [name] LPN,  March 12, 2010 9:39 AM  Follow-up for Phone Call        please have her sched appt for fibromyalgia check this winter or spring- thanks px written on EMR for call in  Follow-up by: Judith Part MD,  March 12, 2010 10:34 AM  Additional Follow-up for Phone Call Additional follow up Details #1::        Patient notified as instructed by telephone. Pt scheduled appt with Dr Milinda Antis 03/22/10 at 9am. Med sent electronically to [facility] (unable to get thru via phone ; line remained busy.)[name] LPN  March 12, 2010 4:59 PM     New/Updated Medications: CYMBALTA 60 MG  CPEP (DULOXETINE HCL) Take 1 tablet by mouth once a day Prescriptions: CYMBALTA 60 MG  CPEP (DULOXETINE HCL) Take 1 tablet by mouth once a day  #30 x 5   Entered by:   [name] LPN   Authorized by:   Judith Part MD   Signed by:   [name] LPN on 96/29/5284   Method used:   Electronically to        [facility]* (retail)       [address]       [place], [place]  [address]       Ph: [phone]       Fax: [phone]   RxID:   [id number] CYMBALTA 60 MG  CPEP (DULOXETINE HCL) Take 1 tablet by mouth once a day  #30 x 5   Entered and Authorized by:   Judith Part MD   Signed by:   Judith Part MD on 03/12/2010   Method used:   Electronically to        [facility]* (retail)       [address]       [place], [place]  [address]       Ph: [phone]       Fax: [phone]   RxID:   [id number]

## 2010-04-04 NOTE — Progress Notes (Signed)
Summary: lyrica   Phone Note Refill Request Message from:  Fax from Pharmacy on February 18, 2010 9:51 AM  Refills Requested: Medication #1:  LYRICA 75 MG CAPS Take 1 capsule by mouth once a day   Last Refilled: 12/20/2009 Refill request from [facility]. [phone].   Initial call taken by: Melody Comas,  February 18, 2010 9:51 AM  Follow-up for Phone Call        px written on EMR for call in  Follow-up by: Judith Part MD,  February 18, 2010 1:19 PM  Additional Follow-up for Phone Call Additional follow up Details #1::        Rx called to pharmacy Additional Follow-up by: [name] CMA Duncan Dull),  February 18, 2010 2:25 PM    New/Updated Medications: LYRICA 75 MG CAPS (PREGABALIN) Take 1 capsule by mouth once a day Prescriptions: LYRICA 75 MG CAPS (PREGABALIN) Take 1 capsule by mouth once a day  #30 x 11   Entered and Authorized by:   Judith Part MD   Signed by:   [name] CMA (AAMA) on 02/18/2010   Method used:   Telephoned to ...       [facility]* (retail)       [address]       [place], [place]  [address]       Ph: [phone]       Fax: [phone]   RxID:   [id number]

## 2010-04-23 ENCOUNTER — Encounter: Payer: Self-pay | Admitting: Family Medicine

## 2010-04-23 ENCOUNTER — Ambulatory Visit (INDEPENDENT_AMBULATORY_CARE_PROVIDER_SITE_OTHER): Payer: Self-pay | Admitting: Family Medicine

## 2010-04-23 DIAGNOSIS — J209 Acute bronchitis, unspecified: Secondary | ICD-10-CM

## 2010-04-24 ENCOUNTER — Ambulatory Visit: Payer: Self-pay | Admitting: Family Medicine

## 2010-04-26 ENCOUNTER — Telehealth: Payer: Self-pay | Admitting: Family Medicine

## 2010-04-30 NOTE — Assessment & Plan Note (Signed)
Summary: COUGH, CHEST PAIN   Vital Signs:  Patient profile:   60 year old female Weight:      128 pounds O2 Sat:      95 % on Room air Temp:     98.3 degrees F oral Pulse rate:   86 / minute Pulse rhythm:   regular BP sitting:   136 / 82  (left arm) Cuff size:   regular  Vitals Entered By: Selena Batten Dance CMA (AAMA) (April 23, 2010 10:17 AM)  O2 Flow:  Room air CC: Cough and chest is painful from coughing Comments OTC meds no help   History of Present Illness: CC: cough  6d h/o coughing productive of yellow phlegm as well as chest soreness from coughing.  Also with HA, body aches, feeling poorly.  + ST.  Has tried nyquil, delsym, excedrin and mucinex.  No sinus congestion, nasal congestion, abd pain, n/v/d, rashes, myalgia/arthralgia, fevers/chills.  no ear pain, tooth pain.  h/o fibromyalgia.  No sick contacts at home.    Pt smoking about 1/3 ppd but down since feeling ill.  Current Medications (verified): 1)  Lyrica 75 Mg Caps (Pregabalin) .... Take 1 Capsule By Mouth Once A Day 2)  Remifemin .... Take 1 Tablet By Mouth Once A Day 3)  Cymbalta 60 Mg  Cpep (Duloxetine Hcl) .... Take 1 Tablet By Mouth Once A Day 4)  Ultram Er 300 Mg  Tb24 (Tramadol Hcl) .... Take 1 Tablet By Mouth Once A Day Prn 5)  Adult Aspirin Low Strength 81 Mg  Tbdp (Aspirin) .... Take 1 Tablet By Mouth Once A Day 6)  Ventolin Hfa 108 (90 Base) Mcg/act Aers (Albuterol Sulfate) .... 2 Puffs Up To Every 4 Hours As Needed Wheeze  Allergies: 1)  ! Tylenol  Past History:  Past Medical History: Last updated: 07/06/2009 Osteoarthritis fibromyalgia endometriosis tab abuse  post herpetic neuralgia  sleep disorder carpal tunnel  Social History: Last updated: 11/21/2009 Marital Status: Married Children:  Occupation: Theatre manager  3 and 1/2 hrs/d husb has prostate ca in remision  smokes 1/2ppd   Review of Systems       per HPI  Physical Exam  General:   Well-developed,well-nourished,in no acute distress; alert,appropriate and cooperative throughout examination Head:  normocephalic, atraumatic, and no abnormalities observed.  + frontal sinus tenderness Eyes:  vision grossly intact, pupils equal, pupils round, pupils reactive to light, and no injection.   Ears:  R ear normal and L ear normal.   Nose:  nares are injected and congested bilaterally  Mouth:  pharynx pink and moist, no erythema, and no exudates.   Neck:  No deformities, masses, or tenderness noted.  no LAD Lungs:  CTA with distant bs that are harsh at bases  scant occas rhonchi Heart:  Normal rate and regular rhythm. S1 and S2 normal without gallop, murmur, click, rub or other extra sounds. Pulses:  2+ rad pulses Extremities:  no pedal edema   Impression & Recommendations:  Problem # 1:  BRONCHITIS, ACUTE (ICD-466.0) early on.  discussed if not improving or worsening to call us for abx course.  cheratussin for cough at night.  discussed expected improvement as likely viral.  advised push fluids and plenty of rest.  rec cut back smoking, rec continued use albuterol.  The following medications were removed from the medication list:    Levaquin 500 Mg Tabs (Levofloxacin) .Marland Kitchen... 1 by mouth once daily for 10 days    Guaifenesin-codeine 100-10 Mg/47ml Soln (Guaifenesin-codeine) .Marland KitchenMarland KitchenMarland KitchenMarland Kitchen  1-2 teaspoons by mouth up to every 4-6 hours as needed cough  watch for sedation Her updated medication list for this problem includes:    Ventolin Hfa 108 (90 Base) Mcg/act Aers (Albuterol sulfate) .Marland Kitchen... 2 puffs up to every 4 hours as needed wheeze    Cheratussin Ac 100-10 Mg/83ml Syrp (Guaifenesin-codeine) .Marland Kitchen... Take one teaspoon three times a day prn cough, sedation precautions  Complete Medication List: 1)  Lyrica 75 Mg Caps (Pregabalin) .... Take 1 capsule by mouth once a day 2)  Remifemin  .... Take 1 tablet by mouth once a day 3)  Cymbalta 60 Mg Cpep (Duloxetine hcl) .... Take 1 tablet by mouth once  a day 4)  Ultram Er 300 Mg Tb24 (Tramadol hcl) .... Take 1 tablet by mouth once a day prn 5)  Adult Aspirin Low Strength 81 Mg Tbdp (Aspirin) .... Take 1 tablet by mouth once a day 6)  Ventolin Hfa 108 (90 Base) Mcg/act Aers (Albuterol sulfate) .... 2 puffs up to every 4 hours as needed wheeze 7)  Cheratussin Ac 100-10 Mg/58ml Syrp (Guaifenesin-codeine) .... Take one teaspoon three times a day prn cough, sedation precautions  Patient Instructions: 1)  Sounds like you have a bronchitis.  Likely viral. 2)  Antibiotics are not needed for this.  Viral infections usually take 7-10 days to resolve.  The cough can last 4 weeks to go away. 3)  Use medication as prescribed: cheratussin for cough at night.   4)  Push fluids and plenty of rest. 5)  Keep taking mucinex during the day. 6)  Please return if you are not improving as expected, or if you have high fevers (>101.5) or worsening breathing or coughing. 7)  Call us at end of week if not feeling better. 8)  Call clinic with questions.  Pleasure to see you today Prescriptions: CHERATUSSIN AC 100-10 MG/5ML SYRP (GUAIFENESIN-CODEINE) take one teaspoon three times a day prn cough, sedation precautions  #100cc x 0   Entered and Authorized by:   Eustaquio Boyden  MD   Signed by:   Eustaquio Boyden  MD on 04/23/2010   Method used:   Print then Give to Patient   RxID:   [id number]    Orders Added: 1)  Est. Patient Level III [14782]    Current Allergies (reviewed today): ! TYLENOL

## 2010-04-30 NOTE — Progress Notes (Signed)
Summary: wants antibiotic  Phone Note Call from Patient Call back at Home Phone [phone] [facility]     Caller: Patient Summary of Call: Pt was seen on 2/21 for bronchitis.  She is still coughing up yellow green mucous.  She is asking if she can have a z-pack called to [place].  No fever. Initial call taken by: Lowella Petties CMA, AAMA,  April 26, 2010 2:35 PM  Follow-up for Phone Call        may call in.  going on 10+ days, in smoker.  cover infection with zpack.  notify pt. Follow-up by: Eustaquio Boyden  MD,  April 26, 2010 2:45 PM  Additional Follow-up for Phone Call Additional follow up Details #1::        Advised pt that medicine has been called in. Additional Follow-up by: Lowella Petties CMA, AAMA,  April 26, 2010 3:01 PM    New/Updated Medications: ZITHROMAX Z-PAK 250 MG TABS (AZITHROMYCIN) take as directed Prescriptions: ZITHROMAX Z-PAK 250 MG TABS (AZITHROMYCIN) take as directed  #1 x 0   Entered and Authorized by:   Eustaquio Boyden  MD   Signed by:   Eustaquio Boyden  MD on 04/26/2010   Method used:   Electronically to        [facility]* (retail)       [address]       [place], [place]  [address]       Ph: [phone]       Fax: [phone]   RxID:   [id number]

## 2010-04-30 NOTE — Letter (Signed)
Summary: Out of Work  [employer] [employer] at [facility]  [address] [place], [place] [address]   Phone: [phone]  Fax: [phone]    April 23, 2010   Employee:  JINAN BIGGINS    To Whom It May Concern:   For Medical reasons, please excuse the above named employee from work for the following dates:  Start:  April 23, 2010   End:  April 23, 2010  If you need additional information, please feel free to contact our office.         Sincerely,    Eustaquio Boyden  MD

## 2010-06-14 ENCOUNTER — Emergency Department: Payer: Self-pay | Admitting: Emergency Medicine

## 2010-07-19 NOTE — Op Note (Signed)
   NAME:  [name] [name], [name] [name]                        ACCOUNT NO.:  [id number]   MEDICAL RECORD NO.:  [id number]                   PATIENT TYPE:  AMB   LOCATION:  ENDO                                 FACILITY:  [facility]   PHYSICIAN:  James L. Malon Kindle., M.D.          DATE OF BIRTH:  [birth year]   DATE OF PROCEDURE:  08/05/2002  DATE OF DISCHARGE:                                 OPERATIVE REPORT   PROCEDURE:  Colonoscopy.   MEDICATIONS:  Fentanyl 100 mcg, Versed 10 mg IV.   INSTRUMENT USED:  Olympus pediatric colonoscope.   INDICATIONS FOR PROCEDURE:  Heme positive stool in a 60 year old.   DESCRIPTION OF PROCEDURE:  The procedure had been explained to the patient  and consent obtained.  With the patient in the left lateral decubitus  position, the scope was inserted and advanced.  The patient had diffuse  melanosis coli with basically darkened colon throughout the entire colon.  Using abdominal pressure and position changes, we were finally able to reach  the cecum with the patient in the right lateral decubitus position. The  ileocecal valve was seen.  The appendiceal orifice was seen in the distance.  I could not intubate the tip of the cecum.  We got down to the level of the  ileocecal valve. The scope withdrawn and the ascending colon, transverse  colon, descending and sigmoid colon were seen well.  No diverticular disease  and diffuse melanosis coli throughout. The scope was withdrawn in the rectum  and the rectum was free of polyps.  In the retroflexed view the patient was  seen to have a 1 cm anal polyp in the proximal anal canal.  The scope was  withdrawn and the colon decompressed.   ASSESSMENT:  1. Heme positive stool.  2. Anal polyp may need removal.  3. Diffuse melanosis coli.   PLAN:  Will see back in the office in six weeks.  Will discuss further with  her whether or not to proceed with removal of this anal polyp.                                               James  L. Malon Kindle., M.D.    Waldron Session  D:  08/05/2002  T:  08/06/2002  Job:  [id number]   cc:   Marne A. Milinda Antis, M.D. [facility]

## 2010-08-30 ENCOUNTER — Other Ambulatory Visit: Payer: Self-pay | Admitting: *Deleted

## 2010-08-30 NOTE — Telephone Encounter (Signed)
Faxed requests from [facility], pt has not been in in a year.

## 2010-08-31 MED ORDER — PREGABALIN 75 MG PO CAPS: 75.0000 mg | ORAL_CAPSULE | Freq: Every day | ORAL | Status: DC

## 2010-08-31 MED ORDER — TRAMADOL HCL (ER BIPHASIC) 300 MG PO CP24: 300.0000 mg | ORAL_CAPSULE | Freq: Every day | ORAL | Status: DC

## 2010-08-31 NOTE — Telephone Encounter (Signed)
Please sched appt with me when able- thanks for the heads up  Will send px electronically

## 2010-09-02 NOTE — Telephone Encounter (Signed)
Unable to reach pt by phone. Medication Lyrica 75mg  phoned to [facility] pharmacy as instructed. April at [facility] will also add note to rx to please call office for appt.

## 2010-11-15 ENCOUNTER — Other Ambulatory Visit: Payer: Self-pay | Admitting: *Deleted

## 2010-11-15 MED ORDER — TRAMADOL HCL (ER BIPHASIC) 300 MG PO CP24: 300.0000 mg | ORAL_CAPSULE | Freq: Every day | ORAL | Status: DC

## 2010-11-15 MED ORDER — PREGABALIN 75 MG PO CAPS: 75.0000 mg | ORAL_CAPSULE | Freq: Every day | ORAL | Status: DC

## 2010-11-15 NOTE — Telephone Encounter (Signed)
Medication phoned to [facility] pharmacy as instructed.  

## 2010-11-15 NOTE — Telephone Encounter (Signed)
Px written for call in   

## 2010-11-25 ENCOUNTER — Other Ambulatory Visit: Payer: Self-pay | Admitting: *Deleted

## 2010-11-25 MED ORDER — PREGABALIN 75 MG PO CAPS: 75.0000 mg | ORAL_CAPSULE | Freq: Every day | ORAL | Status: DC

## 2010-11-25 MED ORDER — DULOXETINE HCL 60 MG PO CPEP: 60.0000 mg | ORAL_CAPSULE | Freq: Every day | ORAL | Status: DC

## 2010-11-25 MED ORDER — TRAMADOL HCL (ER BIPHASIC) 300 MG PO CP24: 300.0000 mg | ORAL_CAPSULE | Freq: Every day | ORAL | Status: DC

## 2010-11-25 NOTE — Telephone Encounter (Signed)
Thanks Px written for call in   

## 2010-11-25 NOTE — Telephone Encounter (Signed)
Pt was told she couldn't get these meds refilled unless she made appt.  Appointment made for 11/21, for physical

## 2010-11-26 ENCOUNTER — Telehealth: Payer: Self-pay | Admitting: *Deleted

## 2010-11-26 NOTE — Telephone Encounter (Signed)
Patient notified as instructed by telephone med had been called in to [facility].Medication phoned to [facility] pharmacy as instructed.

## 2010-11-26 NOTE — Telephone Encounter (Signed)
Call from [place] at [facility]. She called to verify scripts that were sent in yesterday for tramadol and lyrica.  Refills for these meds were sent in on 9/14.  I told Beth to disregard those refills, only fill for what was called in yesterday, since pt has appt on 11/21.

## 2010-12-04 NOTE — Telephone Encounter (Signed)
Yes, thank you - it does not look like I needed to take action on it

## 2010-12-04 NOTE — Telephone Encounter (Signed)
Dr. Tower, did you see this note? 

## 2011-01-21 ENCOUNTER — Encounter: Payer: Self-pay | Admitting: Family Medicine

## 2011-01-22 ENCOUNTER — Encounter: Payer: Self-pay | Admitting: Family Medicine

## 2011-01-22 ENCOUNTER — Ambulatory Visit (INDEPENDENT_AMBULATORY_CARE_PROVIDER_SITE_OTHER): Payer: PRIVATE HEALTH INSURANCE | Admitting: Family Medicine

## 2011-01-22 DIAGNOSIS — M25529 Pain in unspecified elbow: Secondary | ICD-10-CM

## 2011-01-22 DIAGNOSIS — M25522 Pain in left elbow: Secondary | ICD-10-CM | POA: Insufficient documentation

## 2011-01-22 DIAGNOSIS — Z23 Encounter for immunization: Secondary | ICD-10-CM

## 2011-01-22 DIAGNOSIS — IMO0001 Reserved for inherently not codable concepts without codable children: Secondary | ICD-10-CM

## 2011-01-22 DIAGNOSIS — F17200 Nicotine dependence, unspecified, uncomplicated: Secondary | ICD-10-CM

## 2011-01-22 DIAGNOSIS — Z0000 Encounter for general adult medical examination without abnormal findings: Secondary | ICD-10-CM

## 2011-01-22 LAB — CBC WITH DIFFERENTIAL/PLATELET
Basophils Absolute: 0 10*3/uL (ref 0.0–0.1)
Eosinophils Absolute: 0.3 10*3/uL (ref 0.0–0.7)
HCT: 40.3 % (ref 36.0–46.0)
Hemoglobin: 13.6 g/dL (ref 12.0–15.0)
Lymphocytes Relative: 42.6 % (ref 12.0–46.0)
MCHC: 33.9 g/dL (ref 30.0–36.0)
MCV: 91.5 fl (ref 78.0–100.0)
Monocytes Absolute: 0.4 10*3/uL (ref 0.1–1.0)
Neutro Abs: 2.5 10*3/uL (ref 1.4–7.7)
Neutrophils Relative %: 44.8 % (ref 43.0–77.0)
Platelets: 251 10*3/uL (ref 150.0–400.0)
RBC: 4.4 Mil/uL (ref 3.87–5.11)

## 2011-01-22 LAB — LIPID PANEL
Cholesterol: 216 mg/dL — ABNORMAL HIGH (ref 0–200)
HDL: 85.7 mg/dL (ref >39.00)
VLDL: 8.4 mg/dL (ref 0.0–40.0)

## 2011-01-22 LAB — COMPREHENSIVE METABOLIC PANEL
BUN: 9 mg/dL (ref 6–23)
Calcium: 9.6 mg/dL (ref 8.4–10.5)
Glucose, Bld: 90 mg/dL (ref 70–99)
Potassium: 4.6 mEq/L (ref 3.5–5.1)
Total Protein: 7.1 g/dL (ref 6.0–8.3)

## 2011-01-22 MED ORDER — TRAMADOL HCL (ER BIPHASIC) 300 MG PO CP24: 300.0000 mg | ORAL_CAPSULE | Freq: Every day | ORAL | Status: DC

## 2011-01-22 MED ORDER — PREGABALIN 75 MG PO CAPS: 75.0000 mg | ORAL_CAPSULE | Freq: Two times a day (BID) | ORAL | Status: DC

## 2011-01-22 MED ORDER — DULOXETINE HCL 60 MG PO CPEP: 60.0000 mg | ORAL_CAPSULE | Freq: Every day | ORAL | Status: DC

## 2011-01-22 NOTE — Progress Notes (Signed)
Subjective:    Patient ID: [name] [name], female    DOB: 1950/12/31, 60 y.o.   MRN: [id number]  HPI Here for wellness exam and to review chronic problems   bp is 122/80   Still has a lot of fibro symptoms  Pain is a lot to deal with  L elbow is bothering her -- very sensisive - ? Tendonitis or bursitis   Wt is up 2 lb with bmi of 21   Smoker- no interest in quitting or any couseling for that  No breathing problems or cough   hyst in 92 for endometriosis No longer sees gyn  Never had abn paps  No new partners  2 years ago last gyn exam - will wait one more year  No symptoms at all   Mammogram-- long time ago , has not done them as a rule  Will wait until next year - declines currently  Does want breast exam   Had her colonoscopy about 2 years ago -- with 31 y f/u   Has not had a flu shot - will get today  Will also get pneumovax  Td 09   Has had shingles  Interested in shot  Has to check with insurance   Patient Active Problem List  Diagnoses  . POSTHERPETIC NEURALGIA  . TOBACCO ABUSE  . CARPAL TUNNEL SYNDROME  . OSTEOARTHRITIS  . FIBROMYALGIA  . HAND PAIN, BILATERAL  . SLEEP DISORDER  . Routine general medical examination at a health care facility  . Left elbow pain   Past Medical History  Diagnosis Date  . Arthritis     OA  . Fibromyalgia   . Endometriosis   . Tobacco abuse   . Post herpetic neuralgia   . Sleep disorder   . Carpal tunnel syndrome    Past Surgical History  Procedure Date  . Abdominal hysterectomy 1992    total endometriosis  . Appendectomy 1992  . Bladder tack    History  Substance Use Topics  . Smoking status: Current Everyday Smoker -- 0.5 packs/day  . Smokeless tobacco: Not on file  . Alcohol Use:    Family History  Problem Relation Age of Onset  . Heart disease Mother     MI  . Heart disease Father     MI  . Arthritis Sister     crippling arthritis  . Arthritis Brother     crippling arthritis   Allergies    Allergen Reactions  . Acetaminophen     REACTION: gi upset   Current Outpatient Prescriptions on File Prior to Visit  Medication Sig Dispense Refill  . aspirin 81 MG tablet Take 81 mg by mouth daily.        Vedia Coffer Cohosh (REMIFEMIN PO) Take 1 tablet by mouth daily.             Review of Systems Review of Systems  Constitutional: Negative for fever, appetite change, fatigue and unexpected weight change.  Eyes: Negative for pain and visual disturbance.  Respiratory: Negative for cough and shortness of breath.   Cardiovascular: Negative for cp or palpitations    Gastrointestinal: Negative for nausea, diarrhea and constipation.  Genitourinary: Negative for urgency and frequency.  Skin: Negative for pallor or rash   MSK pos for chronic muscle and joint pain  Neurological: Negative for weakness, light-headedness, numbness and headaches.  Hematological: Negative for adenopathy. Does not bruise/bleed easily.  Psychiatric/Behavioral: Negative for dysphoric mood. The patient is not nervous/anxious.  Objective:   Physical Exam  Constitutional: She appears well-developed and well-nourished. No distress.  HENT:  Head: Normocephalic and atraumatic.  Right Ear: External ear normal.  Left Ear: External ear normal.  Nose: Nose normal.  Mouth/Throat: Oropharynx is clear and moist.  Eyes: Conjunctivae and EOM are normal. Pupils are equal, round, and reactive to light. No scleral icterus.  Neck: Normal range of motion. Neck supple. No JVD present. Carotid bruit is not present. No thyromegaly present.  Cardiovascular: Normal rate, regular rhythm, normal heart sounds and intact distal pulses.  Exam reveals no gallop.   Pulmonary/Chest: Effort normal and breath sounds normal. No respiratory distress. She has no wheezes. She exhibits no tenderness.  Abdominal: Soft. Bowel sounds are normal. She exhibits no distension, no abdominal bruit and no mass. There is no tenderness.   Genitourinary: No breast swelling, tenderness, discharge or bleeding.       No breast masses   Musculoskeletal: Normal range of motion. She exhibits no edema and no tenderness.       Myofascial trigger points noted   L elbow- full rom with pain on resisted flexion  Medial more than lateral epicondyle tenderness No swelling/ redness/ crepitice  Lymphadenopathy:    She has no cervical adenopathy.  Neurological: She is alert. She has normal reflexes. No cranial nerve deficit. She exhibits normal muscle tone. Coordination normal.  Skin: Skin is warm and dry. No rash noted. No erythema. No pallor.  Psychiatric: She has a normal mood and affect.          Assessment & Plan:

## 2011-01-22 NOTE — Assessment & Plan Note (Signed)
Suspect tendonitis Recommend ice and relative rest  Will f/u (make appt with Dr Patsy Lager) if not improved in the near future or if swollen or worse

## 2011-01-22 NOTE — Assessment & Plan Note (Signed)
Refilled meds for fibro/ chronic pain  Trigger points present  Recommend inc lyrica to bid if tolerated to see if this would better control pain  meds refilled for the year  Urged to consider water exercise for stiffness

## 2011-01-22 NOTE — Patient Instructions (Addendum)
Please send for last colonoscopy  Flu shot and pneumonia shot today  If you are interested in a shingles/zoster vaccine - call your insurance to check on coverage,( you should not get it within 1 month of other vaccines) , then call us for a prescription  for it to take to a pharmacy that gives the shot  Labs today  Stay as active as you can  If elbow gets worse call and schedule appt with Dr Patsy Lager here  Try to get 1200-1500 mg of calcium per day with at least 1000 iu of vitamin D - for bone health  Increase lyrica to twice daily - to see if this helps pain more

## 2011-01-22 NOTE — Assessment & Plan Note (Signed)
Reviewed health habits including diet and exercise and skin cancer prevention Also reviewed health mt list, fam hx and immunizations   Flu vaccine today  Pneumovax today  Recommended smoking cessation  Breast exam done - pt declined mammogram  Will do pelvic exam next year  Labs today for wellness Urged to stay as active as she can

## 2011-01-24 LAB — TSH: TSH: 2.78 u[IU]/mL (ref 0.35–5.50)

## 2011-01-27 NOTE — Assessment & Plan Note (Signed)
Disc in detail risks of smoking and possible outcomes including copd, vascular/ heart disease, cancer , respiratory and sinus infections  Pt voices understanding  Pt not interested in counseling or quitting today

## 2011-09-12 ENCOUNTER — Other Ambulatory Visit: Payer: Self-pay | Admitting: Family Medicine

## 2011-09-12 NOTE — Telephone Encounter (Signed)
Last OV was 01/13/11. Ok to refill?

## 2011-09-12 NOTE — Telephone Encounter (Signed)
Rx called in as directed.   

## 2011-09-12 NOTE — Telephone Encounter (Signed)
Px written for call in   

## 2011-10-20 ENCOUNTER — Encounter: Payer: Self-pay | Admitting: Family Medicine

## 2011-10-20 ENCOUNTER — Ambulatory Visit (INDEPENDENT_AMBULATORY_CARE_PROVIDER_SITE_OTHER): Payer: PRIVATE HEALTH INSURANCE | Admitting: Family Medicine

## 2011-10-20 VITALS — BP 132/74 | HR 71 | Temp 98.2°F | Ht 65.0 in | Wt 132.8 lb

## 2011-10-20 DIAGNOSIS — J019 Acute sinusitis, unspecified: Secondary | ICD-10-CM

## 2011-10-20 MED ORDER — AMOXICILLIN-POT CLAVULANATE 875-125 MG PO TABS: 1.0000 | ORAL_TABLET | Freq: Two times a day (BID) | ORAL | Status: AC

## 2011-10-20 NOTE — Progress Notes (Signed)
Subjective:    Patient ID: [name] [name], female    DOB: 29-Oct-1950, 61 y.o.   MRN: [id number]  HPI Here with upper resp symptoms This started on Saturday , then sneezed all day on Sunday, just did not feel well Has to sleep in the recliner  Throat is very sore  Headache Extremely congested at night  Took allegra 60 on sat and Sunday Today used zyrtec - a little better   Mucous is white to clear   Is still smoking - about the same   No fever   Coughing - makes her throat raw-non prod Chest is sore when she coughs too  Patient Active Problem List  Diagnosis  . POSTHERPETIC NEURALGIA  . TOBACCO ABUSE  . CARPAL TUNNEL SYNDROME  . OSTEOARTHRITIS  . FIBROMYALGIA  . HAND PAIN, BILATERAL  . SLEEP DISORDER  . Routine general medical examination at a health care facility  . Left elbow pain   Past Medical History  Diagnosis Date  . Arthritis     OA  . Fibromyalgia   . Endometriosis   . Tobacco abuse   . Post herpetic neuralgia   . Sleep disorder   . Carpal tunnel syndrome    Past Surgical History  Procedure Date  . Abdominal hysterectomy 1992    total endometriosis  . Appendectomy 1992  . Bladder tack    History  Substance Use Topics  . Smoking status: Current Everyday Smoker -- 0.5 packs/day  . Smokeless tobacco: Not on file  . Alcohol Use: No   Family History  Problem Relation Age of Onset  . Heart disease Mother     MI  . Heart disease Father     MI  . Arthritis Sister     crippling arthritis  . Arthritis Brother     crippling arthritis   Allergies  Allergen Reactions  . Acetaminophen     REACTION: gi upset   Current Outpatient Prescriptions on File Prior to Visit  Medication Sig Dispense Refill  . aspirin 81 MG tablet Take 81 mg by mouth daily.        . DULoxetine (CYMBALTA) 60 MG capsule Take 1 capsule (60 mg total) by mouth daily.  30 capsule  11  . LYRICA 75 MG capsule TAKE ONE (1) CAPSULE BY MOUTH 2 TIMES   DAILY  60 each  11  .  TraMADol HCl 300 MG CP24 Take 300 mg by mouth daily.  30 capsule  11  . Black Cohosh (REMIFEMIN PO) Take 1 tablet by mouth daily.             Review of Systems Review of Systems  Constitutional: Negative for fever, appetite change,  and unexpected weight change.  Eyes: Negative for pain and visual disturbance.  ENT pos for cong/ sinus pain/ st Respiratory: Negative for sob and wheeze  Cardiovascular: Negative for cp or palpitations    Gastrointestinal: Negative for nausea, diarrhea and constipation.  Genitourinary: Negative for urgency and frequency.  Skin: Negative for pallor or rash   Neurological: Negative for weakness, light-headedness, numbness and headaches.  Hematological: Negative for adenopathy. Does not bruise/bleed easily.  Psychiatric/Behavioral: Negative for dysphoric mood. The patient is not nervous/anxious.         Objective:   Physical Exam  Constitutional: She appears well-developed and well-nourished. No distress.  HENT:  Head: Normocephalic and atraumatic.  Right Ear: External ear normal.  Left Ear: External ear normal.  Mouth/Throat: Oropharynx is clear and moist.  Nares are injected and congested  bilat maxillary and frontal sinus tenderness  Eyes: Conjunctivae and EOM are normal. Pupils are equal, round, and reactive to light. No scleral icterus.  Neck: Normal range of motion. Neck supple. No JVD present. Carotid bruit is not present. No thyromegaly present.  Cardiovascular: Normal rate and regular rhythm.   Pulmonary/Chest: Effort normal and breath sounds normal. No respiratory distress. She has no wheezes. She has no rales.       Harsh bs at bases Diffusely distant bs   Lymphadenopathy:    She has no cervical adenopathy.  Skin: Skin is warm and dry. No rash noted.  Psychiatric: She has a normal mood and affect.          Assessment & Plan:

## 2011-10-20 NOTE — Patient Instructions (Addendum)
Drink lots of fluids and rest The augmentin should take care of the sinus infection  The cold (head and chest) needs to run its course Continue zyrtec Try aleve 1-2 pills with food up to twice daily for sinus pain and congestion You can also use mucinex if needed to thin out mucous I also like nasal saline spray

## 2011-10-20 NOTE — Assessment & Plan Note (Signed)
In smoker with uri symptoms  Disc symptomatic care - see instructions on AVS  Will cover with augmentin  Update if not starting to improve in a week or if worsening   Did counsel to quit smoking and she is not ready yet

## 2012-02-16 ENCOUNTER — Other Ambulatory Visit: Payer: Self-pay | Admitting: Family Medicine

## 2012-02-16 NOTE — Telephone Encounter (Signed)
Please schedule f/u in spring and refil until then, thanks

## 2012-02-16 NOTE — Telephone Encounter (Signed)
appt scheduled for April and meds refilled until then

## 2012-02-16 NOTE — Telephone Encounter (Signed)
Ok to refill 

## 2012-03-16 ENCOUNTER — Other Ambulatory Visit: Payer: Self-pay | Admitting: Family Medicine

## 2012-03-16 NOTE — Telephone Encounter (Signed)
Ok to refill 

## 2012-03-16 NOTE — Telephone Encounter (Signed)
Rx called in as prescribed 

## 2012-03-16 NOTE — Telephone Encounter (Signed)
Px written for call in   

## 2012-03-24 ENCOUNTER — Ambulatory Visit (INDEPENDENT_AMBULATORY_CARE_PROVIDER_SITE_OTHER): Payer: PRIVATE HEALTH INSURANCE | Admitting: Family Medicine

## 2012-03-24 ENCOUNTER — Encounter: Payer: Self-pay | Admitting: Family Medicine

## 2012-03-24 VITALS — BP 146/80 | HR 64 | Temp 97.8°F | Wt 136.0 lb

## 2012-03-24 DIAGNOSIS — Z23 Encounter for immunization: Secondary | ICD-10-CM

## 2012-03-24 DIAGNOSIS — F17200 Nicotine dependence, unspecified, uncomplicated: Secondary | ICD-10-CM

## 2012-03-24 DIAGNOSIS — M1990 Unspecified osteoarthritis, unspecified site: Secondary | ICD-10-CM

## 2012-03-24 DIAGNOSIS — T50905A Adverse effect of unspecified drugs, medicaments and biological substances, initial encounter: Secondary | ICD-10-CM | POA: Insufficient documentation

## 2012-03-24 DIAGNOSIS — T887XXA Unspecified adverse effect of drug or medicament, initial encounter: Secondary | ICD-10-CM

## 2012-03-24 DIAGNOSIS — IMO0001 Reserved for inherently not codable concepts without codable children: Secondary | ICD-10-CM

## 2012-03-24 LAB — COMPREHENSIVE METABOLIC PANEL
ALT: 17 U/L (ref 0–35)
AST: 20 U/L (ref 0–37)
Alkaline Phosphatase: 68 U/L (ref 39–117)
BUN: 7 mg/dL (ref 6–23)
Calcium: 9.2 mg/dL (ref 8.4–10.5)
Chloride: 102 mEq/L (ref 96–112)
Creatinine, Ser: 0.6 mg/dL (ref 0.4–1.2)
GFR: 111.63 mL/min (ref >60.00)
Potassium: 4.1 mEq/L (ref 3.5–5.1)
Total Protein: 6.7 g/dL (ref 6.0–8.3)

## 2012-03-24 MED ORDER — PREGABALIN 75 MG PO CAPS: 75.0000 mg | ORAL_CAPSULE | Freq: Two times a day (BID) | ORAL | Status: DC

## 2012-03-24 MED ORDER — DULOXETINE HCL 60 MG PO CPEP: 60.0000 mg | ORAL_CAPSULE | Freq: Every day | ORAL | Status: DC

## 2012-03-24 NOTE — Assessment & Plan Note (Signed)
Labs today -on cymbalta and ultram ER and lyrica

## 2012-03-24 NOTE — Progress Notes (Signed)
Subjective:    Patient ID: [name] [name], female    DOB: [birth year], 62 y.o.   MRN: [id number]  HPI Here for f/u of fibromyalgia   Her hands are giving her more problems - lifts heavy things at work all day- and they throb all the time  Easy to drop things due to pain  Also joints are getting bigger   Medicines are still working well overall  Is still going to work daily and fxn is intact Mood is great- happy go lucky in nature  Takes occasional walk when it is warm outside   Has not had a flu shot   Is a smoker -not ready to quit yet   Patient Active Problem List  Diagnosis  . POSTHERPETIC NEURALGIA  . TOBACCO ABUSE  . CARPAL TUNNEL SYNDROME  . OSTEOARTHRITIS  . FIBROMYALGIA  . HAND PAIN, BILATERAL  . SLEEP DISORDER  . Routine general medical examination at a health care facility  . Left elbow pain  . Acute bacterial sinusitis   Past Medical History  Diagnosis Date  . Arthritis     OA  . Fibromyalgia   . Endometriosis   . Tobacco abuse   . Post herpetic neuralgia   . Sleep disorder   . Carpal tunnel syndrome    Past Surgical History  Procedure Date  . Abdominal hysterectomy 1992    total endometriosis  . Appendectomy 1992  . Bladder tack    History  Substance Use Topics  . Smoking status: Current Every Day Smoker -- 0.3 packs/day  . Smokeless tobacco: Not on file  . Alcohol Use: No   Family History  Problem Relation Age of Onset  . Heart disease Mother     MI  . Heart disease Father     MI  . Arthritis Sister     crippling arthritis  . Arthritis Brother     crippling arthritis   Allergies  Allergen Reactions  . Acetaminophen     REACTION: gi upset   Current Outpatient Prescriptions on File Prior to Visit  Medication Sig Dispense Refill  . aspirin 81 MG tablet Take 81 mg by mouth daily.        . CYMBALTA 60 MG capsule TAKE ONE CAPSULE BY MOUTH DAILY  30 capsule  3  . LYRICA 75 MG capsule TAKE ONE (1) CAPSULE BY MOUTH 2 TIMES DAILY  60  each  5  . traMADol (ULTRAM-ER) 300 MG 24 hr tablet TAKE ONE (1) TABLET BY MOUTH EVERY      DAY  30 tablet  3      Review of Systems Review of Systems  Constitutional: Negative for fever, appetite change, and unexpected weight change. pos for intermittent fatigue Eyes: Negative for pain and visual disturbance.  Respiratory: Negative for cough and shortness of breath.   Cardiovascular: Negative for cp or palpitations    Gastrointestinal: Negative for nausea, diarrhea and constipation.  Genitourinary: Negative for urgency and frequency.  Skin: Negative for pallor or rash   MSK pos for joint pain and swelling in hands/ pos for myofascial pain intermittently Neurological: Negative for weakness, light-headedness, numbness and headaches.  Hematological: Negative for adenopathy. Does not bruise/bleed easily.  Psychiatric/Behavioral: Negative for dysphoric mood. The patient is not nervous/anxious.         Objective:   Physical Exam  Constitutional: She appears well-developed and well-nourished. No distress.  HENT:  Head: Normocephalic and atraumatic.  Mouth/Throat: Oropharynx is clear and moist.  Eyes: Conjunctivae  normal and EOM are normal. Pupils are equal, round, and reactive to light. No scleral icterus.  Neck: Normal range of motion. Neck supple. Carotid bruit is not present. No thyromegaly present.  Cardiovascular: Normal rate, regular rhythm, normal heart sounds and intact distal pulses.  Exam reveals no gallop.   Pulmonary/Chest: Effort normal and breath sounds normal. No respiratory distress. She has no wheezes. She has no rales.  Abdominal: Soft. Bowel sounds are normal. She exhibits no distension and no mass. There is no tenderness. There is no rebound.  Musculoskeletal: She exhibits tenderness. She exhibits no edema.       Tenderness in MCP joints bilat with some enlargement No myofacial trigger points today- pt is feeling quite well  Lymphadenopathy:    She has no cervical  adenopathy.  Neurological: She is alert. She has normal reflexes. No cranial nerve deficit. She exhibits normal muscle tone. Coordination normal.  Skin: Skin is warm and dry. No rash noted. No erythema. No pallor.          Assessment & Plan:

## 2012-03-24 NOTE — Assessment & Plan Note (Signed)
Doing well overall stable Disc plan for low impact exercise in light of studies showing effectiveness Flu vaccine today  Lab today

## 2012-03-24 NOTE — Assessment & Plan Note (Signed)
Worse in hands - but still working  Disc heat therapy and keeping hands moving Will continue to monitor

## 2012-03-24 NOTE — Assessment & Plan Note (Signed)
Disc in detail risks of smoking and possible outcomes including copd, vascular/ heart disease, cancer , respiratory and sinus infections  Pt voices understanding  

## 2012-03-24 NOTE — Patient Instructions (Addendum)
Stay active  Try parafin wax treatments for hands and keep hands moving  Lab today I sent px to the pharmacy Let us know when you change pharmacy

## 2012-03-25 ENCOUNTER — Encounter: Payer: Self-pay | Admitting: *Deleted

## 2012-06-07 ENCOUNTER — Ambulatory Visit: Payer: PRIVATE HEALTH INSURANCE | Admitting: Family Medicine

## 2012-06-24 ENCOUNTER — Other Ambulatory Visit: Payer: Self-pay | Admitting: Family Medicine

## 2012-06-24 NOTE — Telephone Encounter (Signed)
done

## 2012-06-24 NOTE — Telephone Encounter (Signed)
Ok to refill 

## 2012-06-24 NOTE — Telephone Encounter (Signed)
She can have 12 mo of refils

## 2012-07-12 ENCOUNTER — Other Ambulatory Visit: Payer: Self-pay | Admitting: Family Medicine

## 2012-07-13 NOTE — Telephone Encounter (Signed)
Ok to refill 

## 2012-07-13 NOTE — Telephone Encounter (Signed)
Please refil for 6 months  

## 2012-07-13 NOTE — Telephone Encounter (Signed)
done

## 2012-07-30 ENCOUNTER — Encounter: Payer: Self-pay | Admitting: Family Medicine

## 2012-07-30 ENCOUNTER — Ambulatory Visit (INDEPENDENT_AMBULATORY_CARE_PROVIDER_SITE_OTHER): Payer: BC Managed Care – PPO | Admitting: Family Medicine

## 2012-07-30 VITALS — BP 120/84 | HR 70 | Temp 98.3°F | Ht 65.0 in | Wt 133.0 lb

## 2012-07-30 DIAGNOSIS — S161XXA Strain of muscle, fascia and tendon at neck level, initial encounter: Secondary | ICD-10-CM | POA: Insufficient documentation

## 2012-07-30 DIAGNOSIS — S139XXA Sprain of joints and ligaments of unspecified parts of neck, initial encounter: Secondary | ICD-10-CM

## 2012-07-30 MED ORDER — DICLOFENAC SODIUM 75 MG PO TBEC: 75.0000 mg | DELAYED_RELEASE_TABLET | Freq: Two times a day (BID) | ORAL | Status: DC

## 2012-07-30 MED ORDER — CYCLOBENZAPRINE HCL 5 MG PO TABS: 5.0000 mg | ORAL_TABLET | Freq: Every evening | ORAL | Status: DC | PRN

## 2012-07-30 NOTE — Assessment & Plan Note (Signed)
Trapezius strain. Treat with heat, home pt, massage, nsaid and muscle relaxant.

## 2012-07-30 NOTE — Patient Instructions (Signed)
Start stretching exercises, heat on neck, massage. Anti-inflammatory diclofenac twice daily, muscle relaxant at bedtime.  Call if not improving as expected.

## 2012-07-30 NOTE — Progress Notes (Signed)
  Subjective:    Patient ID: [name] [name], female    DOB: 1950/10/25, 62 y.o.   MRN: [id number]  HPI  62 year old grade pt of Dr. Milinda Antis presents with new pain in left shoulder.. 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 Since then she has noted gradually worsening pain... worse at night. No specific injury. Pain in left posterior shoulder over trapezius. Radiates up and across neck. Increase in pain with abduction and ext rotation. Some pins and needle feeling in shoulder.  No numbness . No weakness in ext. No fever.  No history of neck or shoulder issues.  She took some advil, took hot showers.. Helped  Some temporarily until she starts using it again.  Has fibromyalgia.. Uses tramadol once daily, lyrica once daily, cymbalta once daily.  Review of Systems  Constitutional: Negative for fever and fatigue.  HENT: Negative for ear pain.   Eyes: Negative for pain.  Respiratory: Negative for chest tightness and shortness of breath.   Cardiovascular: Negative for chest pain, palpitations and leg swelling.  Gastrointestinal: Negative for abdominal pain.  Genitourinary: Negative for dysuria.       Objective:   Physical Exam  Constitutional: She appears well-developed and well-nourished.  HENT:  Head: Normocephalic.  Mouth/Throat: Oropharynx is clear and moist.  Neck: Normal range of motion. Neck supple. No thyromegaly present.  Cardiovascular: Normal rate and regular rhythm.   No murmur heard. Pulmonary/Chest: Effort normal and breath sounds normal. No respiratory distress. She has no wheezes. She has no rales. She exhibits no tenderness.  Musculoskeletal:       Left shoulder: She exhibits decreased range of motion, tenderness and pain. She exhibits no bony tenderness, no swelling and no effusion.       Cervical back: She exhibits decreased range of motion and tenderness. She exhibits no bony tenderness.           Assessment & Plan:

## 2012-10-05 ENCOUNTER — Ambulatory Visit (INDEPENDENT_AMBULATORY_CARE_PROVIDER_SITE_OTHER): Payer: BC Managed Care – PPO | Admitting: Family Medicine

## 2012-10-05 ENCOUNTER — Encounter: Payer: Self-pay | Admitting: Family Medicine

## 2012-10-05 VITALS — BP 110/70 | HR 64 | Temp 98.1°F | Ht 65.0 in | Wt 133.2 lb

## 2012-10-05 DIAGNOSIS — H3500 Unspecified background retinopathy: Secondary | ICD-10-CM | POA: Insufficient documentation

## 2012-10-05 DIAGNOSIS — E785 Hyperlipidemia, unspecified: Secondary | ICD-10-CM | POA: Insufficient documentation

## 2012-10-05 DIAGNOSIS — F17200 Nicotine dependence, unspecified, uncomplicated: Secondary | ICD-10-CM

## 2012-10-05 MED ORDER — ATORVASTATIN CALCIUM 10 MG PO TABS: 10.0000 mg | ORAL_TABLET | Freq: Every day | ORAL | Status: DC

## 2012-10-05 NOTE — Assessment & Plan Note (Signed)
Will strive to get LDL under 100 Rev last profile Rev last lipid prof and goals Will start lipitor and update if any side effects  Lab in 4-6 wk

## 2012-10-05 NOTE — Assessment & Plan Note (Signed)
Disc in detail risks of smoking and possible outcomes including copd, vascular/ heart disease, cancer , respiratory and sinus infections  Pt voices understanding Stressed importance of this given recent dx of retinal atherosclerotic change  Disc options- she may consider E cigarette

## 2012-10-05 NOTE — Patient Instructions (Addendum)
Give serious thought to quitting smoking - you have atherosclerosis in your eyes and have risk for it in other places as well Eat a healthy diet and stay active Start lipitor to cholesterol as low as possible Schedule fasting lab in 6-8 weeks for cholesterol and then follow up with me

## 2012-10-05 NOTE — Assessment & Plan Note (Signed)
Rev her opth note - and disc risk factors for atherosclerosis  Main one is smoking- counseled on cessation  Will also strive to get chol as low as possible- start low dose lipitor

## 2012-10-05 NOTE — Progress Notes (Signed)
  Subjective:    Patient ID: [name] [name], female    DOB: 10-Mar-1950, 62 y.o.   MRN: [id number]  HPI Here for f/u of retinopathy -suspected atherosclerotic (Dr Julaine Fusi OD at [facility])  No vision symptoms at all    Hx of smoking She is unsure if she can quit  She has tried to quit in the past  She tried patches in the past- and she could not do with nicotine alone -- she really needed the hit of having cig in hand    Wt is stable with bmi of 22  No HTN  BP Readings from Last 3 Encounters:  10/05/12 110/70  07/30/12 120/84  03/24/12 146/80    No hx of hyperlipidemia Lab Results  Component Value Date   CHOL 216* 01/22/2011   HDL 85.70 01/22/2011   LDLDIRECT 127.3 01/22/2011   TRIG 42.0 01/22/2011   CHOLHDL 3 01/22/2011      Review of Systems Review of Systems  Constitutional: Negative for fever, appetite change, fatigue and unexpected weight change.  Eyes: Negative for pain and visual disturbance.  Respiratory: Negative for cough and shortness of breath.   Cardiovascular: Negative for cp or palpitations    Gastrointestinal: Negative for nausea, diarrhea and constipation.  Genitourinary: Negative for urgency and frequency.  Skin: Negative for pallor or rash   Neurological: Negative for weakness, light-headedness, numbness and headaches.  Hematological: Negative for adenopathy. Does not bruise/bleed easily.  Psychiatric/Behavioral: Negative for dysphoric mood. The patient is not nervous/anxious.         Objective:   Physical Exam  Constitutional: She appears well-developed and well-nourished. No distress.  HENT:  Head: Normocephalic and atraumatic.  Mouth/Throat: Oropharynx is clear and moist.  Eyes: Conjunctivae and EOM are normal. Pupils are equal, round, and reactive to light. Right eye exhibits no discharge. Left eye exhibits no discharge. No scleral icterus.  Neck: Normal range of motion. Neck supple. No JVD present. Carotid bruit is not  present. No thyromegaly present.  Cardiovascular: Normal rate, regular rhythm, normal heart sounds and intact distal pulses.  Exam reveals no gallop.   Pulmonary/Chest: Effort normal and breath sounds normal. No respiratory distress. She has no wheezes. She has no rales.  Diffusely distant bs   Abdominal: Soft. Bowel sounds are normal. She exhibits no distension, no abdominal bruit and no mass. There is no tenderness.  Musculoskeletal: She exhibits no edema.  Lymphadenopathy:    She has no cervical adenopathy.  Neurological: She is alert. She has normal reflexes. No cranial nerve deficit. She exhibits normal muscle tone. Coordination normal.  Skin: Skin is warm and dry.  Psychiatric: She has a normal mood and affect.          Assessment & Plan:

## 2012-11-23 ENCOUNTER — Other Ambulatory Visit (INDEPENDENT_AMBULATORY_CARE_PROVIDER_SITE_OTHER): Payer: BC Managed Care – PPO

## 2012-11-23 DIAGNOSIS — E785 Hyperlipidemia, unspecified: Secondary | ICD-10-CM

## 2012-11-23 LAB — COMPREHENSIVE METABOLIC PANEL
Albumin: 3.7 g/dL (ref 3.5–5.2)
Alkaline Phosphatase: 71 U/L (ref 39–117)
GFR: 95.96 mL/min (ref >60.00)
Glucose, Bld: 82 mg/dL (ref 70–99)
Potassium: 5.2 mEq/L — ABNORMAL HIGH (ref 3.5–5.1)
Total Bilirubin: 0.7 mg/dL (ref 0.3–1.2)

## 2012-11-23 LAB — LIPID PANEL
LDL Cholesterol: 72 mg/dL (ref 0–99)
Total CHOL/HDL Ratio: 2
VLDL: 6.6 mg/dL (ref 0.0–40.0)

## 2012-11-30 ENCOUNTER — Ambulatory Visit (INDEPENDENT_AMBULATORY_CARE_PROVIDER_SITE_OTHER): Payer: BC Managed Care – PPO | Admitting: Family Medicine

## 2012-11-30 ENCOUNTER — Encounter: Payer: Self-pay | Admitting: Family Medicine

## 2012-11-30 VITALS — BP 104/68 | HR 66 | Temp 98.8°F | Ht 65.0 in | Wt 133.5 lb

## 2012-11-30 DIAGNOSIS — H3500 Unspecified background retinopathy: Secondary | ICD-10-CM

## 2012-11-30 DIAGNOSIS — F17200 Nicotine dependence, unspecified, uncomplicated: Secondary | ICD-10-CM

## 2012-11-30 DIAGNOSIS — E875 Hyperkalemia: Secondary | ICD-10-CM | POA: Insufficient documentation

## 2012-11-30 DIAGNOSIS — E785 Hyperlipidemia, unspecified: Secondary | ICD-10-CM

## 2012-11-30 DIAGNOSIS — Z23 Encounter for immunization: Secondary | ICD-10-CM

## 2012-11-30 NOTE — Progress Notes (Signed)
Subjective:    Patient ID: [name] [name], female    DOB: June 05, 1950, 62 y.o.   MRN: [id number]  HPI  Here for f/u of retinopathy and chronic health problems  Doing well   Last visit disc retinop found by opthy- and disc poss atherosclerosis Counseled strongly to quit smoking  Started on statin to lower lipids   bp good today  Lab Results  Component Value Date   CHOL 162 11/23/2012   CHOL 216* 01/22/2011   CHOL 225* 01/29/2007   Lab Results  Component Value Date   HDL 83.70 11/23/2012   HDL 78.29 01/22/2011   HDL 77.3 01/29/2007   Lab Results  Component Value Date   LDLCALC 72 11/23/2012   Lab Results  Component Value Date   TRIG 33.0 11/23/2012   TRIG 42.0 01/22/2011   TRIG 62 01/29/2007   Lab Results  Component Value Date   CHOLHDL 2 11/23/2012   CHOLHDL 3 01/22/2011   CHOLHDL 2.9 CALC 01/29/2007   Lab Results  Component Value Date   LDLDIRECT 127.3 01/22/2011   LDLDIRECT 130.0 01/29/2007   much improved ! No side effects from statin at all  Also eating a healthy diet   Smoking - she has cut back  Now able to go 6 hours without a cigarette at work  She smokes at home - it takes 3 d to get through a pack    Stress - FIL moved in with them and he has alzheimers  Also had high K    Chemistry      Component Value Date/Time   NA 141 11/23/2012 0732   K 5.2* 11/23/2012 0732   CL 109 11/23/2012 0732   CO2 29 11/23/2012 0732   BUN 8 11/23/2012 0732   CREATININE 0.7 11/23/2012 0732      Component Value Date/Time   CALCIUM 9.3 11/23/2012 0732   ALKPHOS 71 11/23/2012 0732   AST 13 11/23/2012 0732   ALT 10 11/23/2012 0732   BILITOT 0.7 11/23/2012 0732     she eats a lot of bananas - tries to eat one per day - sometimes more if they have them at school   No meds that cause high K   Patient Active Problem List   Diagnosis Date Noted  . Retinopathy, bilateral 10/05/2012  . Hyperlipidemia, mild 10/05/2012  . Neck strain 07/30/2012  . Adverse effects of  medication 03/24/2012  . Acute bacterial sinusitis 10/20/2011  . Routine general medical examination at a health care facility 01/22/2011  . Left elbow pain 01/22/2011  . HAND PAIN, BILATERAL 07/06/2009  . SLEEP DISORDER 01/18/2007  . POSTHERPETIC NEURALGIA 12/15/2006  . TOBACCO ABUSE 12/15/2006  . CARPAL TUNNEL SYNDROME 12/15/2006  . OSTEOARTHRITIS 12/15/2006  . FIBROMYALGIA 12/15/2006   Past Medical History  Diagnosis Date  . Arthritis     OA  . Fibromyalgia   . Endometriosis   . Tobacco abuse   . Post herpetic neuralgia   . Sleep disorder   . Carpal tunnel syndrome    Past Surgical History  Procedure Laterality Date  . Abdominal hysterectomy  1992    total endometriosis  . Appendectomy  1992  . Bladder tack     History  Substance Use Topics  . Smoking status: Current Every Day Smoker -- 0.30 packs/day  . Smokeless tobacco: Not on file  . Alcohol Use: No   Family History  Problem Relation Age of Onset  . Heart disease Mother  MI  . Heart disease Father     MI  . Arthritis Sister     crippling arthritis  . Arthritis Brother     crippling arthritis   Allergies  Allergen Reactions  . Acetaminophen     REACTION: gi upset   Current Outpatient Prescriptions on File Prior to Visit  Medication Sig Dispense Refill  . aspirin 81 MG tablet Take 81 mg by mouth daily.        Marland Kitchen atorvastatin (LIPITOR) 10 MG tablet Take 1 tablet (10 mg total) by mouth daily.  30 tablet  11  . DULoxetine (CYMBALTA) 60 MG capsule TAKE ONE CAPSULE BY MOUTH EVERY DAY  30 capsule  11  . pregabalin (LYRICA) 75 MG capsule Take 1 capsule (75 mg total) by mouth 2 (two) times daily.  60 capsule  11  . traMADol (ULTRAM-ER) 300 MG 24 hr tablet TAKE 1 TABLET BY MOUTH EVERY DAY  30 tablet  5  . vitamin C (ASCORBIC ACID) 500 MG tablet Take 500 mg by mouth daily.       No current facility-administered medications on file prior to visit.      Review of Systems Review of Systems  Constitutional:  Negative for fever, appetite change, fatigue and unexpected weight change.  Eyes: Negative for pain and visual disturbance. neg for blurred vision Respiratory: Negative for cough and shortness of breath.  neg for wheeze Cardiovascular: Negative for cp or palpitations    Gastrointestinal: Negative for nausea, diarrhea and constipation.  Genitourinary: Negative for urgency and frequency.  Skin: Negative for pallor or rash   Neurological: Negative for weakness, light-headedness, numbness and headaches.  Hematological: Negative for adenopathy. Does not bruise/bleed easily.  Psychiatric/Behavioral: Negative for dysphoric mood. The patient is not nervous/anxious.         Objective:   Physical Exam  Constitutional: She appears well-developed and well-nourished. No distress.  HENT:  Head: Normocephalic.  Mouth/Throat: Oropharynx is clear and moist.  Eyes: Conjunctivae and EOM are normal. Pupils are equal, round, and reactive to light. No scleral icterus.  Neck: Normal range of motion. Neck supple. No JVD present. Carotid bruit is not present. No thyromegaly present.  Cardiovascular: Normal rate, regular rhythm, normal heart sounds and intact distal pulses.  Exam reveals no gallop.   Pulmonary/Chest: Effort normal and breath sounds normal. No respiratory distress. She has no wheezes. She has no rales.  Diffusely distant bs   Abdominal: She exhibits no abdominal bruit.  Lymphadenopathy:    She has no cervical adenopathy.  Neurological: She is alert. She has normal reflexes.  Skin: Skin is warm and dry. No pallor.  Psychiatric: She has a normal mood and affect.          Assessment & Plan:

## 2012-11-30 NOTE — Patient Instructions (Addendum)
Continue lipitor  Work hard on quitting smoking! Schedule annual exam with labs prior in about 6 months

## 2012-12-01 NOTE — Assessment & Plan Note (Signed)
Mild Pt will cut back on banana intake  Re check at next visit

## 2012-12-01 NOTE — Assessment & Plan Note (Signed)
Will try to dec risk factors for this - now on statin with very low cholesterol  Quitting smoking will be most important factor

## 2012-12-01 NOTE — Assessment & Plan Note (Signed)
Disc in detail risks of smoking and possible outcomes including copd, vascular/ heart disease, cancer , respiratory and sinus infections  Pt voices understanding Disc role of smoking in atherosclerosis that is likely causing her retinopathy Made plan to consider medication or smoke free cigarettes to begin quitting She has cut down but not quit yet

## 2012-12-01 NOTE — Assessment & Plan Note (Signed)
Much improved with low dose lipitor  No side eff Disc goals for lipids and reasons to control them Rev labs with pt Rev low sat fat diet in detail  Will continue it

## 2012-12-21 ENCOUNTER — Telehealth: Payer: Self-pay | Admitting: Family Medicine

## 2012-12-21 MED ORDER — TRAMADOL HCL 50 MG PO TABS: 50.0000 mg | ORAL_TABLET | Freq: Three times a day (TID) | ORAL | Status: DC | PRN

## 2012-12-21 NOTE — Telephone Encounter (Signed)
Will try tramadol 50 mg and take up to tid as needed  Please call in  Careful of sedation and take only as much as she needs

## 2012-12-21 NOTE — Telephone Encounter (Signed)
Amy from [facility] called.  Pt will not have insurance coverage until the first of the year.  Her RX for Tramadol ER would cost $160 and pt cannot afford it.  Pharmacy wants to know if Dr. Milinda Antis will write new RX for Tramadol regular release and dose more frequently as this would only cost pt $26/month.  RX can be faxed to [phone].

## 2012-12-22 NOTE — Telephone Encounter (Signed)
Rx called in as prescribed and advise pharmacy to cancel Tramadol ER Rx

## 2013-02-21 ENCOUNTER — Other Ambulatory Visit: Payer: Self-pay | Admitting: Family Medicine

## 2013-02-21 NOTE — Telephone Encounter (Signed)
Px written for call in   

## 2013-02-21 NOTE — Telephone Encounter (Signed)
Electronic refill request, please advise  

## 2013-02-22 NOTE — Telephone Encounter (Signed)
Rx called in as prescribed 

## 2013-04-22 ENCOUNTER — Other Ambulatory Visit: Payer: Self-pay | Admitting: Family Medicine

## 2013-04-22 NOTE — Telephone Encounter (Signed)
Electronic refill request, please advise  

## 2013-04-22 NOTE — Telephone Encounter (Signed)
She has an upcoming appt  Please refill for 3 months

## 2013-04-22 NOTE — Telephone Encounter (Signed)
done

## 2013-05-23 ENCOUNTER — Telehealth: Payer: Self-pay | Admitting: Family Medicine

## 2013-05-23 DIAGNOSIS — Z0000 Encounter for general adult medical examination without abnormal findings: Secondary | ICD-10-CM

## 2013-05-23 NOTE — Telephone Encounter (Signed)
Message copied by Abner Greenspan on Mon May 23, 2013  6:01 PM ------      Message from: Ellamae Sia      Created: Mon May 16, 2013 11:40 AM      Regarding: Lab orders for Tuesday, 3.24.15       Patient is scheduled for CPX labs, please order future labs, Thanks , Terri       ------

## 2013-05-24 ENCOUNTER — Other Ambulatory Visit: Payer: BC Managed Care – PPO

## 2013-05-27 ENCOUNTER — Other Ambulatory Visit (INDEPENDENT_AMBULATORY_CARE_PROVIDER_SITE_OTHER): Payer: BC Managed Care – PPO

## 2013-05-27 DIAGNOSIS — Z0000 Encounter for general adult medical examination without abnormal findings: Secondary | ICD-10-CM

## 2013-05-27 LAB — COMPREHENSIVE METABOLIC PANEL
ALK PHOS: 75 U/L (ref 39–117)
ALT: 13 U/L (ref 0–35)
AST: 16 U/L (ref 0–37)
Albumin: 4.1 g/dL (ref 3.5–5.2)
BILIRUBIN TOTAL: 0.5 mg/dL (ref 0.3–1.2)
BUN: 12 mg/dL (ref 6–23)
CO2: 28 mEq/L (ref 19–32)
CREATININE: 0.8 mg/dL (ref 0.4–1.2)
Calcium: 9.8 mg/dL (ref 8.4–10.5)
Chloride: 105 mEq/L (ref 96–112)
GFR: 81.41 mL/min (ref >60.00)
Glucose, Bld: 93 mg/dL (ref 70–99)
Potassium: 4.9 mEq/L (ref 3.5–5.1)
Sodium: 139 mEq/L (ref 135–145)
Total Protein: 6.8 g/dL (ref 6.0–8.3)

## 2013-05-27 LAB — CBC WITH DIFFERENTIAL/PLATELET
BASOS PCT: 0.2 % (ref 0.0–3.0)
Basophils Absolute: 0 10*3/uL (ref 0.0–0.1)
EOS ABS: 0.3 10*3/uL (ref 0.0–0.7)
Eosinophils Relative: 5.4 % — ABNORMAL HIGH (ref 0.0–5.0)
HEMATOCRIT: 40.5 % (ref 36.0–46.0)
HEMOGLOBIN: 13.7 g/dL (ref 12.0–15.0)
LYMPHS ABS: 2.3 10*3/uL (ref 0.7–4.0)
Lymphocytes Relative: 45.7 % (ref 12.0–46.0)
MCHC: 33.9 g/dL (ref 30.0–36.0)
MCV: 91 fl (ref 78.0–100.0)
MONO ABS: 0.3 10*3/uL (ref 0.1–1.0)
Monocytes Relative: 6.8 % (ref 3.0–12.0)
NEUTROS ABS: 2.1 10*3/uL (ref 1.4–7.7)
NEUTROS PCT: 41.9 % — AB (ref 43.0–77.0)
Platelets: 265 10*3/uL (ref 150.0–400.0)
RBC: 4.45 Mil/uL (ref 3.87–5.11)
RDW: 13.7 % (ref 11.5–14.6)
WBC: 5.1 10*3/uL (ref 4.5–10.5)

## 2013-05-27 LAB — LIPID PANEL
CHOL/HDL RATIO: 2
CHOLESTEROL: 197 mg/dL (ref 0–200)
HDL: 98.9 mg/dL (ref >39.00)
LDL Cholesterol: 90 mg/dL (ref 0–99)
Triglycerides: 42 mg/dL (ref 0.0–149.0)
VLDL: 8.4 mg/dL (ref 0.0–40.0)

## 2013-05-27 LAB — TSH: TSH: 1.92 u[IU]/mL (ref 0.35–5.50)

## 2013-05-31 ENCOUNTER — Ambulatory Visit (INDEPENDENT_AMBULATORY_CARE_PROVIDER_SITE_OTHER): Payer: BC Managed Care – PPO | Admitting: Family Medicine

## 2013-05-31 ENCOUNTER — Encounter: Payer: Self-pay | Admitting: Family Medicine

## 2013-05-31 ENCOUNTER — Telehealth: Payer: Self-pay | Admitting: Family Medicine

## 2013-05-31 VITALS — BP 138/86 | HR 71 | Temp 98.2°F | Ht 64.5 in | Wt 137.0 lb

## 2013-05-31 DIAGNOSIS — Z0000 Encounter for general adult medical examination without abnormal findings: Secondary | ICD-10-CM

## 2013-05-31 DIAGNOSIS — Z1211 Encounter for screening for malignant neoplasm of colon: Secondary | ICD-10-CM

## 2013-05-31 DIAGNOSIS — E785 Hyperlipidemia, unspecified: Secondary | ICD-10-CM

## 2013-05-31 DIAGNOSIS — F17200 Nicotine dependence, unspecified, uncomplicated: Secondary | ICD-10-CM

## 2013-05-31 MED ORDER — DULOXETINE HCL 60 MG PO CPEP: ORAL_CAPSULE | ORAL | Status: DC

## 2013-05-31 MED ORDER — PREGABALIN 75 MG PO CAPS
ORAL_CAPSULE | ORAL | Status: DC
Start: 2013-05-31 — End: 2014-01-11

## 2013-05-31 NOTE — Progress Notes (Signed)
Subjective:    Patient ID: [name] [name], female    DOB: 06/27/1950, 63 y.o.   MRN: [id number]  HPI Here for health maintenance exam and to review chronic medical problems    Doing pretty well overall  OA hands and fingers hurt more with time  Fibromyalgia bothers her a lot   Wt is up 4 lb with bmi of 23 Appetite is good   Colon cancer screen- declines a colonoscopy She wants to do the IFOB card   Zoster vaccine -is interested and will call her ins about coverage   Pap 11/10- had a hysterectomy No gyn problems or symptoms at all   Mammogram-declined - still declines them and understands risks  Self exam-no lumps or changes   Flu vaccine 9/14  Td 1/09  Smoking status-she has been working harder on cutting down to eventually quit Not ready to totally quit yet  1 pack per week maximum - doing much better   Hyperlipidemia On lipitor and diet  Lab Results  Component Value Date   CHOL 197 05/27/2013   CHOL 162 11/23/2012   CHOL 216* 01/22/2011   Lab Results  Component Value Date   HDL 98.90 05/27/2013   HDL 83.70 11/23/2012   HDL 85.70 01/22/2011   Lab Results  Component Value Date   LDLCALC 90 05/27/2013   LDLCALC 72 11/23/2012   Lab Results  Component Value Date   TRIG 42.0 05/27/2013   TRIG 33.0 11/23/2012   TRIG 42.0 01/22/2011   Lab Results  Component Value Date   CHOLHDL 2 05/27/2013   CHOLHDL 2 11/23/2012   CHOLHDL 3 01/22/2011   Lab Results  Component Value Date   LDLDIRECT 127.3 01/22/2011   LDLDIRECT 130.0 01/29/2007    Really good numbers Tolerates the statin  Also eating a healthy diet  Not as much exercise in winter- will start walking daily now in the evenings   Results for orders placed in visit on 05/27/13  CBC WITH DIFFERENTIAL      Result Value Ref Range   WBC 5.1  4.5 - 10.5 K/uL   RBC 4.45  3.87 - 5.11 Mil/uL   Hemoglobin 13.7  12.0 - 15.0 g/dL   HCT 40.5  36.0 - 46.0 %   MCV 91.0  78.0 - 100.0 fl   MCHC 33.9  30.0 - 36.0 g/dL    RDW 13.7  11.5 - 14.6 %   Platelets 265.0  150.0 - 400.0 K/uL   Neutrophils Relative % 41.9 (*) 43.0 - 77.0 %   Lymphocytes Relative 45.7  12.0 - 46.0 %   Monocytes Relative 6.8  3.0 - 12.0 %   Eosinophils Relative 5.4 (*) 0.0 - 5.0 %   Basophils Relative 0.2  0.0 - 3.0 %   Neutro Abs 2.1  1.4 - 7.7 K/uL   Lymphs Abs 2.3  0.7 - 4.0 K/uL   Monocytes Absolute 0.3  0.1 - 1.0 K/uL   Eosinophils Absolute 0.3  0.0 - 0.7 K/uL   Basophils Absolute 0.0  0.0 - 0.1 K/uL  COMPREHENSIVE METABOLIC PANEL      Result Value Ref Range   Sodium 139  135 - 145 mEq/L   Potassium 4.9  3.5 - 5.1 mEq/L   Chloride 105  96 - 112 mEq/L   CO2 28  19 - 32 mEq/L   Glucose, Bld 93  70 - 99 mg/dL   BUN 12  6 - 23 mg/dL   Creatinine, Ser 0.8  0.4 - 1.2 mg/dL   Total Bilirubin 0.5  0.3 - 1.2 mg/dL   Alkaline Phosphatase 75  39 - 117 U/L   AST 16  0 - 37 U/L   ALT 13  0 - 35 U/L   Total Protein 6.8  6.0 - 8.3 g/dL   Albumin 4.1  3.5 - 5.2 g/dL   Calcium 9.8  8.4 - 10.5 mg/dL   GFR 81.41  >60.00 mL/min  LIPID PANEL      Result Value Ref Range   Cholesterol 197  0 - 200 mg/dL   Triglycerides 42.0  0.0 - 149.0 mg/dL   HDL 98.90  >39.00 mg/dL   VLDL 8.4  0.0 - 40.0 mg/dL   LDL Cholesterol 90  0 - 99 mg/dL   Total CHOL/HDL Ratio 2    TSH      Result Value Ref Range   TSH 1.92  0.35 - 5.50 uIU/mL     Patient Active Problem List   Diagnosis Date Noted  . Hyperkalemia 11/30/2012  . Retinopathy, bilateral 10/05/2012  . Hyperlipidemia, mild 10/05/2012  . Neck strain 07/30/2012  . Adverse effects of medication 03/24/2012  . Routine general medical examination at a health care facility 01/22/2011  . Left elbow pain 01/22/2011  . HAND PAIN, BILATERAL 07/06/2009  . SLEEP DISORDER 01/18/2007  . POSTHERPETIC NEURALGIA 12/15/2006  . TOBACCO ABUSE 12/15/2006  . CARPAL TUNNEL SYNDROME 12/15/2006  . OSTEOARTHRITIS 12/15/2006  . FIBROMYALGIA 12/15/2006   Past Medical History  Diagnosis Date  . Arthritis      OA  . Fibromyalgia   . Endometriosis   . Tobacco abuse   . Post herpetic neuralgia   . Sleep disorder   . Carpal tunnel syndrome    Past Surgical History  Procedure Laterality Date  . Abdominal hysterectomy  1992    total endometriosis  . Appendectomy  1992  . Bladder tack     History  Substance Use Topics  . Smoking status: Current Every Day Smoker -- 0.30 packs/day  . Smokeless tobacco: Not on file  . Alcohol Use: No   Family History  Problem Relation Age of Onset  . Heart disease Mother     MI  . Heart disease Father     MI  . Arthritis Sister     crippling arthritis  . Arthritis Brother     crippling arthritis   Allergies  Allergen Reactions  . Acetaminophen     REACTION: gi upset   Current Outpatient Prescriptions on File Prior to Visit  Medication Sig Dispense Refill  . aspirin 81 MG tablet Take 81 mg by mouth daily.        Marland Kitchen atorvastatin (LIPITOR) 10 MG tablet Take 1 tablet (10 mg total) by mouth daily.  30 tablet  11  . DULoxetine (CYMBALTA) 60 MG capsule TAKE ONE CAPSULE BY MOUTH DAILY  30 capsule  2  . LYRICA 75 MG capsule TAKE ONE (1) CAPSULE BY MOUTH 2 TIMES DAILY  60 capsule  5  . traMADol (ULTRAM) 50 MG tablet Take 1 tablet (50 mg total) by mouth every 8 (eight) hours as needed for pain.  90 tablet  3  . vitamin C (ASCORBIC ACID) 500 MG tablet Take 500 mg by mouth daily.       No current facility-administered medications on file prior to visit.    Review of Systems Review of Systems  Constitutional: Negative for fever, appetite change, fatigue and unexpected weight change.  Eyes:  Negative for pain and visual disturbance. (no change in vision) Respiratory: Negative for cough and shortness of breath.   Cardiovascular: Negative for cp or palpitations    Gastrointestinal: Negative for nausea, diarrhea and constipation.  Genitourinary: Negative for urgency and frequency.  Skin: Negative for pallor or rash   MSK pos for arthritis pain in hands and  myofasical pain from fibro  Neurological: Negative for weakness, light-headedness, numbness and headaches.  Hematological: Negative for adenopathy. Does not bruise/bleed easily.  Psychiatric/Behavioral: Negative for dysphoric mood. The patient is not nervous/anxious.         Objective:   Physical Exam  Constitutional: She appears well-developed and well-nourished. No distress.  HENT:  Head: Normocephalic and atraumatic.  Right Ear: External ear normal.  Left Ear: External ear normal.  Mouth/Throat: Oropharynx is clear and moist.  Eyes: Conjunctivae and EOM are normal. Pupils are equal, round, and reactive to light. No scleral icterus.  Neck: Normal range of motion. Neck supple. No JVD present. Carotid bruit is not present. No thyromegaly present.  Cardiovascular: Normal rate, regular rhythm, normal heart sounds and intact distal pulses.  Exam reveals no gallop.   Pulmonary/Chest: Effort normal and breath sounds normal. No respiratory distress. She has no wheezes. She exhibits no tenderness.  Abdominal: Soft. Bowel sounds are normal. She exhibits no distension, no abdominal bruit and no mass. There is no tenderness.  Genitourinary: No breast swelling, tenderness, discharge or bleeding.  Breast exam: No mass, nodules, thickening, tenderness, bulging, retraction, inflamation, nipple discharge or skin changes noted.  No axillary or clavicular LA.      Musculoskeletal: Normal range of motion. She exhibits no edema and no tenderness.  Changes of OA in fingers   Lymphadenopathy:    She has no cervical adenopathy.  Neurological: She is alert. She has normal reflexes. No cranial nerve deficit. She exhibits normal muscle tone. Coordination normal.  Skin: Skin is warm and dry. No rash noted. No erythema. No pallor.  Psychiatric: She has a normal mood and affect.          Assessment & Plan:

## 2013-05-31 NOTE — Patient Instructions (Signed)
Please do stool card for colon cancer screening  If you are interested in a shingles/zoster vaccine - call your insurance to check on coverage,( you should not get it within 1 month of other vaccines) , then call us for a prescription  for it to take to a pharmacy that gives the shot , or make a nurse visit to get it here depending on your coverage   If you change your mind about a mammogram- you can schedule that yourself  Eat a healthy diet Get back to walking  Keep working on quitting smoking

## 2013-05-31 NOTE — Progress Notes (Signed)
Pre visit review using our clinic review tool, if applicable. No additional management support is needed unless otherwise documented below in the visit note. 

## 2013-05-31 NOTE — Telephone Encounter (Signed)
Relevant patient education mailed to patient.  

## 2013-06-01 NOTE — Assessment & Plan Note (Signed)
D/w patient [id number] for colon cancer screening, including IFOB vs. colonoscopy.  Risks and benefits of both were discussed and patient voiced understanding.  Pt elects for: IFOB Declines colonosc at this time Given ifob card

## 2013-06-01 NOTE — Assessment & Plan Note (Signed)
Disc in detail risks of smoking and possible outcomes including copd, vascular/ heart disease, cancer , respiratory and sinus infections  Pt voices understanding Urged her to continue cutting down  She is not ready to quit

## 2013-06-01 NOTE — Assessment & Plan Note (Signed)
Reviewed health habits including diet and exercise and skin cancer prevention Reviewed appropriate screening tests for age  Also reviewed health mt list, fam hx and immunization status , as well as social and family history    See HPI Lab rev Pt declines mammogram-disc risks of this and she voiced understanding She will check on coverage of shingles vaccine

## 2013-06-01 NOTE — Assessment & Plan Note (Signed)
Disc goals for lipids and reasons to control them Rev labs with pt Rev low sat fat diet in detail  Doing well with lipitor

## 2013-06-09 ENCOUNTER — Other Ambulatory Visit (INDEPENDENT_AMBULATORY_CARE_PROVIDER_SITE_OTHER): Payer: BC Managed Care – PPO

## 2013-06-09 DIAGNOSIS — Z1211 Encounter for screening for malignant neoplasm of colon: Secondary | ICD-10-CM

## 2013-06-09 LAB — FECAL OCCULT BLOOD, IMMUNOCHEMICAL: Fecal Occult Bld: NEGATIVE

## 2013-06-13 ENCOUNTER — Encounter: Payer: Self-pay | Admitting: *Deleted

## 2013-08-29 ENCOUNTER — Other Ambulatory Visit: Payer: Self-pay | Admitting: Family Medicine

## 2013-08-29 NOTE — Telephone Encounter (Signed)
Please call in.  Thanks.   

## 2013-08-29 NOTE — Telephone Encounter (Signed)
Last filled 12/21/12

## 2013-08-30 NOTE — Telephone Encounter (Signed)
Rx called in to pharmacy. 

## 2013-10-17 ENCOUNTER — Other Ambulatory Visit: Payer: Self-pay | Admitting: Family Medicine

## 2013-11-23 ENCOUNTER — Other Ambulatory Visit: Payer: Self-pay | Admitting: Family Medicine

## 2013-11-23 NOTE — Telephone Encounter (Signed)
Rx called in as prescribed 

## 2013-11-23 NOTE — Telephone Encounter (Signed)
Last filled 08/29/2013

## 2013-11-23 NOTE — Telephone Encounter (Signed)
Px written for call in   

## 2014-01-11 ENCOUNTER — Ambulatory Visit (INDEPENDENT_AMBULATORY_CARE_PROVIDER_SITE_OTHER): Payer: BC Managed Care – PPO | Admitting: Internal Medicine

## 2014-01-11 ENCOUNTER — Other Ambulatory Visit: Payer: Self-pay | Admitting: Family Medicine

## 2014-01-11 ENCOUNTER — Other Ambulatory Visit: Payer: Self-pay | Admitting: *Deleted

## 2014-01-11 ENCOUNTER — Encounter: Payer: Self-pay | Admitting: Internal Medicine

## 2014-01-11 VITALS — BP 134/80 | HR 88 | Temp 98.3°F | Wt 141.5 lb

## 2014-01-11 DIAGNOSIS — J069 Acute upper respiratory infection, unspecified: Secondary | ICD-10-CM

## 2014-01-11 MED ORDER — HYDROCODONE-HOMATROPINE 5-1.5 MG/5ML PO SYRP: 5.0000 mL | ORAL_SOLUTION | Freq: Three times a day (TID) | ORAL | Status: DC | PRN

## 2014-01-11 MED ORDER — AZITHROMYCIN 250 MG PO TABS: ORAL_TABLET | ORAL | Status: DC

## 2014-01-11 MED ORDER — PREGABALIN 75 MG PO CAPS: ORAL_CAPSULE | ORAL | Status: DC

## 2014-01-11 NOTE — Progress Notes (Signed)
Subjective:    Patient ID: [name] [name], female    DOB: 1950/08/28, 63 y.o.   MRN: [id number]  HPI Pt is a 63 yr old female that presents with a cough.  Pt states that she began having a cough 2 weeks ago.  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  Pt denies any facial pressure or runny nose and her cough is productive with yellow sputum.  Pt has tried OTC Mucinex with minimal relief.  Pt does have headaches associated with the coughing and is also having some left sided chest and back pain associated with the coughing.  PT denies fever, N/V or SOB.  Pt does smoke, 1 pack will last her 2-3 days.  Review of Systems  Past Medical History  Diagnosis Date  . Arthritis     OA  . Fibromyalgia   . Endometriosis   . Tobacco abuse   . Post herpetic neuralgia   . Sleep disorder   . Carpal tunnel syndrome     Current Outpatient Prescriptions  Medication Sig Dispense Refill  . aspirin 81 MG tablet Take 81 mg by mouth daily.      Marland Kitchen atorvastatin (LIPITOR) 10 MG tablet TAKE 1 TABLET BY MOUTH DAILY 30 tablet 2  . DULoxetine (CYMBALTA) 60 MG capsule TAKE ONE CAPSULE BY MOUTH DAILY 30 capsule 11  . pregabalin (LYRICA) 75 MG capsule TAKE ONE (1) CAPSULE BY MOUTH 2 TIMES DAILY 60 capsule 11  . traMADol (ULTRAM) 50 MG tablet TAKE 1 TABLET BY MOUTH EVERY 8 HOURS AS NEEDED FOR PAIN. 90 tablet 1  . vitamin C (ASCORBIC ACID) 500 MG tablet Take 500 mg by mouth daily.    Marland Kitchen azithromycin (ZITHROMAX) 250 MG tablet Take 2 tabs today, then 1 tab daily x 4 days 6 tablet 0  . HYDROcodone-homatropine (HYCODAN) 5-1.5 MG/5ML syrup Take 5 mLs by mouth every 8 (eight) hours as needed for cough. 120 mL 0   No current facility-administered medications for this visit.    Allergies  Allergen Reactions  . Acetaminophen     REACTION: gi upset    Family History  Problem Relation Age of Onset  . Heart disease Mother     MI  . Heart  disease Father     MI  . Arthritis Sister     crippling arthritis  . Arthritis Brother     crippling arthritis    History   Social History  . Marital Status: Married    Spouse Name: N/A    Number of Children: N/A  . Years of Education: N/A   Occupational History  . Not on file.   Social History Main Topics  . Smoking status: Current Every Day Smoker -- 0.30 packs/day  . Smokeless tobacco: Not on file  . Alcohol Use: No  . Drug Use: No  . Sexual Activity: Not on file   Other Topics Concern  . Not on file   Social History Narrative     Constitutional: Positive Headache.  Denies fever, malaise, fatigue or abrupt weight changes.  HEENT: Denies eye pain, eye redness, ear pain, ringing in the ears, wax buildup, runny nose, nasal congestion, bloody nose, or sore throat. Respiratory: Positive cough and sputum production.  Denies difficulty breathing, shortness of breath   Cardiovascular: Positive left sided chest pain associated with coughing.  Denies chest tightness, palpitations or swelling in the hands or feet.  Gastrointestinal: Denies  abdominal pain, bloating, constipation, diarrhea or blood in the stool.   No other specific complaints in a complete review of systems (except as listed in HPI above).     Objective:   Physical Exam BP 134/80 mmHg  Pulse 88  Temp(Src) 98.3 F (36.8 C) (Oral)  Wt 141 lb 8 oz (64.184 kg)  SpO2 97% Wt Readings from Last 3 Encounters:  01/11/14 141 lb 8 oz (64.184 kg)  05/31/13 137 lb (62.143 kg)  11/30/12 133 lb 8 oz (60.555 kg)    General: Appears her stated age, well developed, well nourished in NAD. Skin: Warm, dry and intact. No rashes, lesions or ulcerations noted. HEENT: Head: normal shape and size; Eyes: sclera white, no icterus, conjunctiva pink, PERRLA and EOMs intact; Ears: Tm's gray and intact, normal light reflex; Nose: mucosa pink and moist, septum midline; Throat/Mouth: Teeth present, mucosa pink and moist, no exudate,  lesions or ulcerations noted.  Neck: Normal range of motion. Neck supple, trachea midline. No massses, lumps or thyromegaly present. No JVD or cervical lymphadenopathy noted. Cardiovascular: Normal rate and rhythm. S1,S2 noted.  No murmur, rubs or gallops noted. Pulmonary/Chest: Normal effort with occasional expiratory wheeze and coarse breathe sounds throughout. No respiratory distress. No rales or ronchi noted.   EKG:  BMET    Component Value Date/Time   NA 139 05/27/2013 0900   K 4.9 05/27/2013 0900   CL 105 05/27/2013 0900   CO2 28 05/27/2013 0900   GLUCOSE 93 05/27/2013 0900   BUN 12 05/27/2013 0900   CREATININE 0.8 05/27/2013 0900   CALCIUM 9.8 05/27/2013 0900   GFRNONAA 69 01/29/2007 0930   GFRAA 83 01/29/2007 0930    Lipid Panel     Component Value Date/Time   CHOL 197 05/27/2013 0900   TRIG 42.0 05/27/2013 0900   HDL 98.90 05/27/2013 0900   CHOLHDL 2 05/27/2013 0900   VLDL 8.4 05/27/2013 0900   LDLCALC 90 05/27/2013 0900    CBC    Component Value Date/Time   WBC 5.1 05/27/2013 0900   RBC 4.45 05/27/2013 0900   HGB 13.7 05/27/2013 0900   HCT 40.5 05/27/2013 0900   PLT 265.0 05/27/2013 0900   MCV 91.0 05/27/2013 0900   MCHC 33.9 05/27/2013 0900   RDW 13.7 05/27/2013 0900   LYMPHSABS 2.3 05/27/2013 0900   MONOABS 0.3 05/27/2013 0900   EOSABS 0.3 05/27/2013 0900   BASOSABS 0.0 05/27/2013 0900    Hgb A1C No results found for: HGBA1C     Assessment & Plan:  Upper Respiratory Infection -Due to symptoms being present x 2 weeks, will prescribe z-pak -Hycodan for cough relief -Continue to rest and my use salt water gargles for relief of scratchy throat. -RTC if symptoms worsen or if not better in 7 days.

## 2014-01-11 NOTE — Progress Notes (Signed)
Pre visit review using our clinic review tool, if applicable. No additional management support is needed unless otherwise documented below in the visit note. 

## 2014-01-11 NOTE — Patient Instructions (Signed)
Cough, Adult  A cough is a reflex that helps clear your throat and airways. It can help heal the body or may be a reaction to an irritated airway. A cough may only last 2 or 3 weeks (acute) or may last more than 8 weeks (chronic).  CAUSES Acute cough:  Viral or bacterial infections. Chronic cough:  Infections.  Allergies.  Asthma.  Post-nasal drip.  Smoking.  Heartburn or acid reflux.  Some medicines.  Chronic lung problems (COPD).  Cancer. SYMPTOMS   Cough.  Fever.  Chest pain.  Increased breathing rate.  High-pitched whistling sound when breathing (wheezing).  Colored mucus that you cough up (sputum). TREATMENT   A bacterial cough may be treated with antibiotic medicine.  A viral cough must run its course and will not respond to antibiotics.  Your caregiver may recommend other treatments if you have a chronic cough. HOME CARE INSTRUCTIONS   Only take over-the-counter or prescription medicines for pain, discomfort, or fever as directed by your caregiver. Use cough suppressants only as directed by your caregiver.  Use a cold steam vaporizer or humidifier in your bedroom or home to help loosen secretions.  Sleep in a semi-upright position if your cough is worse at night.  Rest as needed.  Stop smoking if you smoke. SEEK IMMEDIATE MEDICAL CARE IF:   You have pus in your sputum.  Your cough starts to worsen.  You cannot control your cough with suppressants and are losing sleep.  You begin coughing up blood.  You have difficulty breathing.  You develop pain which is getting worse or is uncontrolled with medicine.  You have a fever. MAKE SURE YOU:   Understand these instructions.  Will watch your condition.  Will get help right away if you are not doing well or get worse. Document Released: 08/16/2010 Document Revised: 05/12/2011 Document Reviewed: 08/16/2010 [employer] Patient Information 2015 [employer]. This information is not intended  to replace advice given to you by your health care provider. Make sure you discuss any questions you have with your health care provider.  

## 2014-01-11 NOTE — Progress Notes (Signed)
HPI  Pt presents to the clinic today with c/o cough and chest congestion. She reports this started 2 weeks ago. The cough is productive of thick yellow mucous. She denies other URI symptoms. She has noticed some pain in her left upper back. She has tried Mucinex without much relief. She is a current smoker. She has had sick contacts with similar symptoms.  Review of Systems      Past Medical History  Diagnosis Date  . Arthritis     OA  . Fibromyalgia   . Endometriosis   . Tobacco abuse   . Post herpetic neuralgia   . Sleep disorder   . Carpal tunnel syndrome     Family History  Problem Relation Age of Onset  . Heart disease Mother     MI  . Heart disease Father     MI  . Arthritis Sister     crippling arthritis  . Arthritis Brother     crippling arthritis    History   Social History  . Marital Status: Married    Spouse Name: N/A    Number of Children: N/A  . Years of Education: N/A   Occupational History  . Not on file.   Social History Main Topics  . Smoking status: Current Every Day Smoker -- 0.30 packs/day  . Smokeless tobacco: Not on file  . Alcohol Use: No  . Drug Use: No  . Sexual Activity: Not on file   Other Topics Concern  . Not on file   Social History Narrative    Allergies  Allergen Reactions  . Acetaminophen     REACTION: gi upset     Constitutional: Positive headache. Denies fatigue, fever or abrupt weight changes.  HEENT:  Positive sore throat. Denies eye redness, eye pain, pressure behind the eyes, facial pain, nasal congestion, ear pain, ringing in the ears, wax buildup, runny nose or bloody nose. Respiratory: Positive cough. Denies difficulty breathing or shortness of breath.  Cardiovascular: Denies chest pain, chest tightness, palpitations or swelling in the hands or feet.   No other specific complaints in a complete review of systems (except as listed in HPI above).  Objective:  BP 134/80 mmHg  Pulse 88  Temp(Src) 98.3 F  (36.8 C) (Oral)  Wt 141 lb 8 oz (64.184 kg)  SpO2 97%  Wt Readings from Last 3 Encounters:  01/11/14 141 lb 8 oz (64.184 kg)  05/31/13 137 lb (62.143 kg)  11/30/12 133 lb 8 oz (60.555 kg)     General: Appears her stated age, well developed, well nourished in NAD. HEENT: Head: normal shape and size; Ears: Tm's gray and intact, normal light reflex; Nose: mucosa pink and moist, septum midline; Throat/Mouth: Teeth present, mucosa erythematous and moist, no exudate noted, no lesions or ulcerations noted.  Cardiovascular: Normal rate and rhythm. S1,S2 noted.  No murmur, rubs or gallops noted.  Pulmonary/Chest: Normal effort and intermittent expiratory wheeze noted. No respiratory distress. No rales or rhonchi noted.      Assessment & Plan:   Upper Respiratory Infection with cough and congestion:  Get some rest and drink plenty of water Do salt water gargles for the sore throat eRx for Azithromax x 5 days eRx for Hycodan cough syrup  RTC as needed or if symptoms persist.

## 2014-01-11 NOTE — Telephone Encounter (Signed)
Rob at [facility] advise me that Rx is only good for 6 months since it's a controlled sub so the remaining refills from march are voided

## 2014-01-11 NOTE — Telephone Encounter (Signed)
Called to [facility]. 

## 2014-01-11 NOTE — Telephone Encounter (Signed)
Px written for call in   

## 2014-01-30 ENCOUNTER — Encounter: Payer: Self-pay | Admitting: Internal Medicine

## 2014-01-30 ENCOUNTER — Ambulatory Visit (INDEPENDENT_AMBULATORY_CARE_PROVIDER_SITE_OTHER): Payer: BC Managed Care – PPO | Admitting: Internal Medicine

## 2014-01-30 VITALS — BP 122/84 | HR 73 | Temp 98.3°F | Wt 141.0 lb

## 2014-01-30 DIAGNOSIS — T148XXA Other injury of unspecified body region, initial encounter: Secondary | ICD-10-CM

## 2014-01-30 DIAGNOSIS — T148 Other injury of unspecified body region: Secondary | ICD-10-CM

## 2014-01-30 MED ORDER — KETOROLAC TROMETHAMINE 30 MG/ML IJ SOLN
30.0000 mg | Freq: Once | INTRAMUSCULAR | Status: AC
  Administered 2014-01-30: 30 mg via INTRAMUSCULAR

## 2014-01-30 NOTE — Progress Notes (Signed)
Subjective:    Patient ID: [name] [name], female    DOB: Jan 30, 1951, 63 y.o.   MRN: [id number]  HPI   Pt presents to the clinic today with c/o lower back pain. She was getting in her car on thanksgiving day and felt a "pulling" sensation.  She denies hearing any popping noise. The pain is in her b/l lower back with radiation into her buttocks. She is experiencing numbness with sudden movement but it quickly goes away after a few seconds. She has experienced a muscle strain before and states this feels similar.  Pt has tried heat and ice as well as ibuprofen and tramadol with minimal relief.  Pt denies any other injury, urinary or bowel incontinence.  Review of Systems  Hematological: Does not bruise/bleed easily.    Past Medical History  Diagnosis Date  . Arthritis     OA  . Fibromyalgia   . Endometriosis   . Tobacco abuse   . Post herpetic neuralgia   . Sleep disorder   . Carpal tunnel syndrome     Current Outpatient Prescriptions  Medication Sig Dispense Refill  . pregabalin (LYRICA) 75 MG capsule Take 75 mg by mouth 2 (two) times daily.    Marland Kitchen aspirin 81 MG tablet Take 81 mg by mouth daily.      Marland Kitchen atorvastatin (LIPITOR) 10 MG tablet TAKE 1 TABLET BY MOUTH DAILY 30 tablet 2  . DULoxetine (CYMBALTA) 60 MG capsule TAKE ONE CAPSULE BY MOUTH DAILY 30 capsule 11  . traMADol (ULTRAM) 50 MG tablet TAKE 1 TABLET BY MOUTH EVERY 8 HOURS AS NEEDED FOR PAIN. 90 tablet 1  . vitamin C (ASCORBIC ACID) 500 MG tablet Take 500 mg by mouth daily.     No current facility-administered medications for this visit.    Allergies  Allergen Reactions  . Acetaminophen     REACTION: gi upset    Family History  Problem Relation Age of Onset  . Heart disease Mother     MI  . Heart disease Father     MI  . Arthritis Sister     crippling arthritis  . Arthritis Brother     crippling arthritis    History   Social History  . Marital Status: Married    Spouse Name: N/A    Number of  Children: N/A  . Years of Education: N/A   Occupational History  . Not on file.   Social History Main Topics  . Smoking status: Current Every Day Smoker -- 0.30 packs/day  . Smokeless tobacco: Not on file  . Alcohol Use: No  . Drug Use: No  . Sexual Activity: Not on file   Other Topics Concern  . Not on file   Social History Narrative     GU: Denies urgency, frequency, pain with urination, burning sensation, blood in urine, odor or discharge. Musculoskeletal: Positive decrease in range of motion, difficulty with gait, muscle pain or joint pain.  Denies swelling  Neurological: Positive numbness in legs.  Denies dizziness, difficulty with memory, problems with balance and coordination.   No other specific complaints in a complete review of systems (except as listed in HPI above).     Objective:   Physical Exam   BP 122/84 mmHg  Pulse 73  Temp(Src) 98.3 F (36.8 C) (Oral)  Wt 141 lb (63.957 kg)  SpO2 98% Wt Readings from Last 3 Encounters:  01/30/14 141 lb (63.957 kg)  01/11/14 141 lb 8 oz (64.184 kg)  05/31/13  137 lb (62.143 kg)    General: Appears her stated age, well developed, well nourished in NAD. Musculoskeletal: Decreases flexion or extension of spine.  Tenderness noted over lumbar paraspinal muscles, bilaterally.  No tenderness over spinal processes noted. No signs of joint swelling. Strength 5/5 on lower left extremity and 4/5 on lower right extremity, limited by pain. Neurological: Alert and oriented.    BMET    Component Value Date/Time   NA 139 05/27/2013 0900   K 4.9 05/27/2013 0900   CL 105 05/27/2013 0900   CO2 28 05/27/2013 0900   GLUCOSE 93 05/27/2013 0900   BUN 12 05/27/2013 0900   CREATININE 0.8 05/27/2013 0900   CALCIUM 9.8 05/27/2013 0900   GFRNONAA 69 01/29/2007 0930   GFRAA 83 01/29/2007 0930    Lipid Panel     Component Value Date/Time   CHOL 197 05/27/2013 0900   TRIG 42.0 05/27/2013 0900   HDL 98.90 05/27/2013 0900   CHOLHDL  2 05/27/2013 0900   VLDL 8.4 05/27/2013 0900   LDLCALC 90 05/27/2013 0900    CBC    Component Value Date/Time   WBC 5.1 05/27/2013 0900   RBC 4.45 05/27/2013 0900   HGB 13.7 05/27/2013 0900   HCT 40.5 05/27/2013 0900   PLT 265.0 05/27/2013 0900   MCV 91.0 05/27/2013 0900   MCHC 33.9 05/27/2013 0900   RDW 13.7 05/27/2013 0900   LYMPHSABS 2.3 05/27/2013 0900   MONOABS 0.3 05/27/2013 0900   EOSABS 0.3 05/27/2013 0900   BASOSABS 0.0 05/27/2013 0900    Hgb A1C No results found for: HGBA1C    Assessment & Plan:   Muscle Strain of left lower back:  -Toradol 30 mg IM -Continue Tramadol at home -Told pt to continue to use heating pad and ice. -Gave pt stretches to complete at home  RTC if symptoms worsen or if pt has not have any relief of symptoms within 5 days.

## 2014-01-30 NOTE — Patient Instructions (Signed)
Back Exercises These exercises may help you when beginning to rehabilitate your injury. Your symptoms may resolve with or without further involvement from your physician, physical therapist or athletic trainer. While completing these exercises, remember:   Restoring tissue flexibility helps normal motion to return to the joints. This allows healthier, less painful movement and activity.  An effective stretch should be held for at least 30 seconds.  A stretch should never be painful. You should only feel a gentle lengthening or release in the stretched tissue. STRETCH - Extension, Prone on Elbows   Lie on your stomach on the floor, a bed will be too soft. Place your palms about shoulder width apart and at the height of your head.  Place your elbows under your shoulders. If this is too painful, stack pillows under your chest.  Allow your body to relax so that your hips drop lower and make contact more completely with the floor.  Hold this position for __________ seconds.  Slowly return to lying flat on the floor. Repeat __________ times. Complete this exercise __________ times per day.  RANGE OF MOTION - Extension, Prone Press Ups   Lie on your stomach on the floor, a bed will be too soft. Place your palms about shoulder width apart and at the height of your head.  Keeping your back as relaxed as possible, slowly straighten your elbows while keeping your hips on the floor. You may adjust the placement of your hands to maximize your comfort. As you gain motion, your hands will come more underneath your shoulders.  Hold this position __________ seconds.  Slowly return to lying flat on the floor. Repeat __________ times. Complete this exercise __________ times per day.  RANGE OF MOTION- Quadruped, Neutral Spine   Assume a hands and knees position on a firm surface. Keep your hands under your shoulders and your knees under your hips. You may place padding under your knees for  comfort.  Drop your head and point your tail bone toward the ground below you. This will round out your low back like an angry cat. Hold this position for __________ seconds.  Slowly lift your head and release your tail bone so that your back sags into a large arch, like an old horse.  Hold this position for __________ seconds.  Repeat this until you feel limber in your low back.  Now, find your "sweet spot." This will be the most comfortable position somewhere between the two previous positions. This is your neutral spine. Once you have found this position, tense your stomach muscles to support your low back.  Hold this position for __________ seconds. Repeat __________ times. Complete this exercise __________ times per day.  STRETCH - Flexion, Single Knee to Chest   Lie on a firm bed or floor with both legs extended in front of you.  Keeping one leg in contact with the floor, bring your opposite knee to your chest. Hold your leg in place by either grabbing behind your thigh or at your knee.  Pull until you feel a gentle stretch in your low back. Hold __________ seconds.  Slowly release your grasp and repeat the exercise with the opposite side. Repeat __________ times. Complete this exercise __________ times per day.  STRETCH - Hamstrings, Standing  Stand or sit and extend your right / left leg, placing your foot on a chair or foot stool  Keeping a slight arch in your low back and your hips straight forward.  Lead with your chest and   lean forward at the waist until you feel a gentle stretch in the back of your right / left knee or thigh. (When done correctly, this exercise requires leaning only a small distance.)  Hold this position for __________ seconds. Repeat __________ times. Complete this stretch __________ times per day. STRENGTHENING - Deep Abdominals, Pelvic Tilt   Lie on a firm bed or floor. Keeping your legs in front of you, bend your knees so they are both pointed  toward the ceiling and your feet are flat on the floor.  Tense your lower abdominal muscles to press your low back into the floor. This motion will rotate your pelvis so that your tail bone is scooping upwards rather than pointing at your feet or into the floor.  With a gentle tension and even breathing, hold this position for __________ seconds. Repeat __________ times. Complete this exercise __________ times per day.  STRENGTHENING - Abdominals, Crunches   Lie on a firm bed or floor. Keeping your legs in front of you, bend your knees so they are both pointed toward the ceiling and your feet are flat on the floor. Cross your arms over your chest.  Slightly tip your chin down without bending your neck.  Tense your abdominals and slowly lift your trunk high enough to just clear your shoulder blades. Lifting higher can put excessive stress on the low back and does not further strengthen your abdominal muscles.  Control your return to the starting position. Repeat __________ times. Complete this exercise __________ times per day.  STRENGTHENING - Quadruped, Opposite UE/LE Lift   Assume a hands and knees position on a firm surface. Keep your hands under your shoulders and your knees under your hips. You may place padding under your knees for comfort.  Find your neutral spine and gently tense your abdominal muscles so that you can maintain this position. Your shoulders and hips should form a rectangle that is parallel with the floor and is not twisted.  Keeping your trunk steady, lift your right hand no higher than your shoulder and then your left leg no higher than your hip. Make sure you are not holding your breath. Hold this position __________ seconds.  Continuing to keep your abdominal muscles tense and your back steady, slowly return to your starting position. Repeat with the opposite arm and leg. Repeat __________ times. Complete this exercise __________ times per day. Document Released:  03/07/2005 Document Revised: 05/12/2011 Document Reviewed: 06/01/2008 [employer] Patient Information 2015 [employer]. This information is not intended to replace advice given to you by your health care provider. Make sure you discuss any questions you have with your health care provider.  

## 2014-01-30 NOTE — Progress Notes (Signed)
Pre visit review using our clinic review tool, if applicable. No additional management support is needed unless otherwise documented below in the visit note. 

## 2014-02-01 ENCOUNTER — Other Ambulatory Visit: Payer: Self-pay | Admitting: Family Medicine

## 2014-02-01 ENCOUNTER — Telehealth: Payer: Self-pay

## 2014-02-01 ENCOUNTER — Other Ambulatory Visit: Payer: Self-pay | Admitting: Internal Medicine

## 2014-02-01 MED ORDER — HYDROCODONE-ACETAMINOPHEN 5-325 MG PO TABS: 1.0000 | ORAL_TABLET | Freq: Four times a day (QID) | ORAL | Status: DC | PRN

## 2014-02-01 NOTE — Telephone Encounter (Signed)
Work not and Rx left in front office for pt's husband to pick up

## 2014-02-01 NOTE — Telephone Encounter (Signed)
Electronic refill request, no recent/future CPE or f/u, pt has had 2 recent acute appts. with [name], NP, please advise

## 2014-02-01 NOTE — Telephone Encounter (Signed)
Pt left v/m; pt seen 01/30/14 with back pain. Pt has been doing back exercises and alternating heat and ice with no real improvement in pain. Tramadol is not helping the pain and pt said she was to cb for different med if pain did not improve. Pt request cb. [place].

## 2014-02-01 NOTE — Telephone Encounter (Signed)
Will give RX for Norco, but she will have to pick up RX

## 2014-02-01 NOTE — Telephone Encounter (Signed)
Pt is aware--however pt states she does not know if she would be ready to go back to work and is requesting you to have her return to work on Monday 02/06/2014--please advise

## 2014-02-01 NOTE — Telephone Encounter (Signed)
Please refill for 6 mo 

## 2014-02-01 NOTE — Telephone Encounter (Signed)
[place] for work note out til 12/7

## 2014-06-20 ENCOUNTER — Other Ambulatory Visit: Payer: Self-pay | Admitting: Family Medicine

## 2014-06-20 NOTE — Telephone Encounter (Signed)
Last OV 01/30/14 - acute.  Last refilled tramadol #90 x1 RF and Cymbalta #30 x11 RF.  No upcoming appointments.

## 2014-06-20 NOTE — Telephone Encounter (Addendum)
rx called into pharmacy Left message on VM to call for CPX appt

## 2014-06-20 NOTE — Telephone Encounter (Signed)
Please schedule f/u  Please call in tramadol I sent the other px

## 2014-06-22 ENCOUNTER — Telehealth: Payer: Self-pay | Admitting: Family Medicine

## 2014-06-22 NOTE — Telephone Encounter (Signed)
Pt scheduled cpe  For Nov 1.  Pt wants to know if you can refill her meds until then. Please call pt back on the home phone. Thanks.

## 2014-06-22 NOTE — Telephone Encounter (Signed)
Spoke with pt and she didn't need any new refills now, she just wanted to make sure when she runs out that we will refill it since she has a CPE scheduled, I advise pt that when she needs a refill just let the pharmacy know and they will send [place] the refill request, pt verbalized understanding

## 2014-08-01 ENCOUNTER — Other Ambulatory Visit: Payer: Self-pay | Admitting: Family Medicine

## 2014-08-01 NOTE — Telephone Encounter (Signed)
Please refill to get by until her PE Thanks

## 2014-08-01 NOTE — Telephone Encounter (Signed)
Rx called in as prescribed with enough refills until appt

## 2014-08-01 NOTE — Telephone Encounter (Signed)
Last office visit 01/30/2014 with [name].  CPE scheduled 01/02/2015.  Ok to refill?

## 2014-09-05 ENCOUNTER — Other Ambulatory Visit: Payer: Self-pay | Admitting: Family Medicine

## 2014-09-05 NOTE — Telephone Encounter (Signed)
Electronic refill request, pt has CPE scheduled on 01/02/15, last refilled 06/20/14 #30 with 1 additional refill, please advise

## 2014-09-05 NOTE — Telephone Encounter (Signed)
Please refill until next appt Thanks

## 2014-09-05 NOTE — Telephone Encounter (Signed)
done

## 2014-10-24 ENCOUNTER — Other Ambulatory Visit: Payer: Self-pay | Admitting: Family Medicine

## 2014-10-24 NOTE — Telephone Encounter (Signed)
rx called into pharmacy

## 2014-10-24 NOTE — Telephone Encounter (Signed)
Pt has not had any recent f/u. Acute only; CPE 05/2013

## 2014-10-24 NOTE — Telephone Encounter (Signed)
Please schedule PE for winter  Px written for call in

## 2014-12-06 ENCOUNTER — Ambulatory Visit (INDEPENDENT_AMBULATORY_CARE_PROVIDER_SITE_OTHER): Payer: Medicare Other

## 2014-12-06 DIAGNOSIS — Z23 Encounter for immunization: Secondary | ICD-10-CM

## 2014-12-25 ENCOUNTER — Telehealth: Payer: Self-pay | Admitting: Family Medicine

## 2014-12-25 DIAGNOSIS — E875 Hyperkalemia: Secondary | ICD-10-CM

## 2014-12-25 DIAGNOSIS — E785 Hyperlipidemia, unspecified: Secondary | ICD-10-CM

## 2014-12-25 DIAGNOSIS — Z0000 Encounter for general adult medical examination without abnormal findings: Secondary | ICD-10-CM

## 2014-12-25 NOTE — Telephone Encounter (Signed)
-----   Message from Ellamae Sia sent at 12/19/2014  3:07 PM EDT ----- Regarding: Lab orders for Tuesday, 10.25.16 Patient is scheduled for CPX labs, please order future labs, Thanks , Karna Christmas

## 2014-12-26 ENCOUNTER — Other Ambulatory Visit (INDEPENDENT_AMBULATORY_CARE_PROVIDER_SITE_OTHER): Payer: Medicare Other

## 2014-12-26 DIAGNOSIS — E785 Hyperlipidemia, unspecified: Secondary | ICD-10-CM

## 2014-12-26 DIAGNOSIS — Z0000 Encounter for general adult medical examination without abnormal findings: Secondary | ICD-10-CM

## 2014-12-26 LAB — CBC WITH DIFFERENTIAL/PLATELET
BASOS ABS: 0 10*3/uL (ref 0.0–0.1)
Basophils Relative: 0.5 % (ref 0.0–3.0)
Eosinophils Absolute: 0.4 10*3/uL (ref 0.0–0.7)
Eosinophils Relative: 5.5 % — ABNORMAL HIGH (ref 0.0–5.0)
HCT: 41.3 % (ref 36.0–46.0)
Hemoglobin: 13.9 g/dL (ref 12.0–15.0)
LYMPHS ABS: 3.2 10*3/uL (ref 0.7–4.0)
Lymphocytes Relative: 48.1 % — ABNORMAL HIGH (ref 12.0–46.0)
MCHC: 33.7 g/dL (ref 30.0–36.0)
MCV: 91.5 fl (ref 78.0–100.0)
MONOS PCT: 5.9 % (ref 3.0–12.0)
Monocytes Absolute: 0.4 10*3/uL (ref 0.1–1.0)
NEUTROS ABS: 2.7 10*3/uL (ref 1.4–7.7)
NEUTROS PCT: 40 % — AB (ref 43.0–77.0)
PLATELETS: 256 10*3/uL (ref 150.0–400.0)
RBC: 4.52 Mil/uL (ref 3.87–5.11)
RDW: 14 % (ref 11.5–15.5)
WBC: 6.6 10*3/uL (ref 4.0–10.5)

## 2014-12-26 LAB — COMPREHENSIVE METABOLIC PANEL
ALBUMIN: 4 g/dL (ref 3.5–5.2)
ALK PHOS: 77 U/L (ref 39–117)
ALT: 11 U/L (ref 0–35)
AST: 16 U/L (ref 0–37)
BILIRUBIN TOTAL: 0.5 mg/dL (ref 0.2–1.2)
BUN: 8 mg/dL (ref 6–23)
CALCIUM: 9.9 mg/dL (ref 8.4–10.5)
CHLORIDE: 103 meq/L (ref 96–112)
CO2: 29 mEq/L (ref 19–32)
CREATININE: 0.66 mg/dL (ref 0.40–1.20)
GFR: 95.33 mL/min (ref >60.00)
Glucose, Bld: 88 mg/dL (ref 70–99)
Potassium: 4.2 mEq/L (ref 3.5–5.1)
Sodium: 138 mEq/L (ref 135–145)
Total Protein: 6.8 g/dL (ref 6.0–8.3)

## 2014-12-26 LAB — LIPID PANEL
CHOLESTEROL: 187 mg/dL (ref 0–200)
HDL: 79.6 mg/dL (ref >39.00)
LDL Cholesterol: 93 mg/dL (ref 0–99)
NonHDL: 107.4
TRIGLYCERIDES: 74 mg/dL (ref 0.0–149.0)
Total CHOL/HDL Ratio: 2
VLDL: 14.8 mg/dL (ref 0.0–40.0)

## 2014-12-26 LAB — TSH: TSH: 5.59 u[IU]/mL — AB (ref 0.35–4.50)

## 2015-01-02 ENCOUNTER — Ambulatory Visit (INDEPENDENT_AMBULATORY_CARE_PROVIDER_SITE_OTHER): Payer: Medicare Other | Admitting: Family Medicine

## 2015-01-02 ENCOUNTER — Encounter: Payer: Self-pay | Admitting: Family Medicine

## 2015-01-02 ENCOUNTER — Encounter: Payer: Self-pay | Admitting: *Deleted

## 2015-01-02 VITALS — BP 138/86 | HR 62 | Temp 98.1°F | Ht 65.0 in | Wt 142.5 lb

## 2015-01-02 DIAGNOSIS — Z1211 Encounter for screening for malignant neoplasm of colon: Secondary | ICD-10-CM

## 2015-01-02 DIAGNOSIS — Z0000 Encounter for general adult medical examination without abnormal findings: Secondary | ICD-10-CM | POA: Insufficient documentation

## 2015-01-02 DIAGNOSIS — Z23 Encounter for immunization: Secondary | ICD-10-CM

## 2015-01-02 DIAGNOSIS — F17200 Nicotine dependence, unspecified, uncomplicated: Secondary | ICD-10-CM

## 2015-01-02 DIAGNOSIS — E785 Hyperlipidemia, unspecified: Secondary | ICD-10-CM

## 2015-01-02 DIAGNOSIS — R7989 Other specified abnormal findings of blood chemistry: Secondary | ICD-10-CM

## 2015-01-02 NOTE — Patient Instructions (Signed)
IFOB stool kit -please do for colon cancer screening  prevnar vaccine today  If you are interested in a shingles/zoster vaccine - call your insurance to check on coverage,( you should not get it within 1 month of other vaccines) , then call us for a prescription  for it to take to a pharmacy that gives the shot , or make a nurse visit to get it here depending on your coverage  Please think about quitting smoking   Take care of yourself

## 2015-01-02 NOTE — Progress Notes (Signed)
Subjective:    Patient ID: [name] [name], female    DOB: Nov 30, 1950, 64 y.o.   MRN: [id number]  HPI Here for annual medicare wellness visit as well as chronic/acute medical problems as well as annual preventative exam  I have personally reviewed the Medicare Annual Wellness questionnaire and have noted 1. The patient's medical and social history 2. Their use of alcohol, tobacco or illicit drugs 3. Their current medications and supplements 4. The patient's functional ability including ADL's, fall risks, home safety risks and hearing or visual             impairment. 5. Diet and physical activities 6. Evidence for depression or mood disorders  The patients weight, height, BMI have been recorded in the chart and visual acuity is per eye clinic.  I have made referrals, counseling and provided education to the patient based review of the above and I have provided the pt with a written personalized care plan for preventive services. Reviewed and updated provider list, see scanned forms.  Doing pretty well overall    See scanned forms.  Routine anticipatory guidance given to patient.  See health maintenance. Colon cancer screening - has not had a colonoscopy in years- declines, will do stool kit  Not interested in Hep C or HIV screen  Breast cancer screening - has not had one in years - wants to get one at [facility] (has gone there before) - she will schedule her own  Self breast exam-no lumps  Flu vaccine 10/16 Tetanus vaccine 1/09  Pneumovax - due for the prevnar  Zoster vaccine- ? If insurance pays for one -she will find out  dexa - has not had / is not interested , is a smoker, no falls or fractures  Advance directive- has a living will and POA  Cognitive function addressed- see scanned forms- and if abnormal then additional documentation follows.  No concerns about memory   PMH and SH reviewed  Meds, vitals, and allergies reviewed.   ROS: See HPI.  Otherwise negative.      smoker -about the same  No intention to quit  Not interested in lung cancer screening program for [place]   Cholesterol Lab Results  Component Value Date   CHOL 187 12/26/2014   HDL 79.60 12/26/2014   LDLCALC 93 12/26/2014   LDLDIRECT 127.3 01/22/2011   TRIG 74.0 12/26/2014   CHOLHDL 2 12/26/2014    Very good cholesterol profile   Results for orders placed or performed in visit on 12/26/14  CBC with Differential/Platelet  Result Value Ref Range   WBC 6.6 4.0 - 10.5 K/uL   RBC 4.52 3.87 - 5.11 Mil/uL   Hemoglobin 13.9 12.0 - 15.0 g/dL   HCT 41.3 36.0 - 46.0 %   MCV 91.5 78.0 - 100.0 fl   MCHC 33.7 30.0 - 36.0 g/dL   RDW 14.0 11.5 - 15.5 %   Platelets 256.0 150.0 - 400.0 K/uL   Neutrophils Relative % 40.0 (L) 43.0 - 77.0 %   Lymphocytes Relative 48.1 (H) 12.0 - 46.0 %   Monocytes Relative 5.9 3.0 - 12.0 %   Eosinophils Relative 5.5 (H) 0.0 - 5.0 %   Basophils Relative 0.5 0.0 - 3.0 %   Neutro Abs 2.7 1.4 - 7.7 K/uL   Lymphs Abs 3.2 0.7 - 4.0 K/uL   Monocytes Absolute 0.4 0.1 - 1.0 K/uL   Eosinophils Absolute 0.4 0.0 - 0.7 K/uL   Basophils Absolute 0.0 0.0 - 0.1 K/uL  Comprehensive metabolic panel  Result Value Ref Range   Sodium 138 135 - 145 mEq/L   Potassium 4.2 3.5 - 5.1 mEq/L   Chloride 103 96 - 112 mEq/L   CO2 29 19 - 32 mEq/L   Glucose, Bld 88 70 - 99 mg/dL   BUN 8 6 - 23 mg/dL   Creatinine, Ser 0.66 0.40 - 1.20 mg/dL   Total Bilirubin 0.5 0.2 - 1.2 mg/dL   Alkaline Phosphatase 77 39 - 117 U/L   AST 16 0 - 37 U/L   ALT 11 0 - 35 U/L   Total Protein 6.8 6.0 - 8.3 g/dL   Albumin 4.0 3.5 - 5.2 g/dL   Calcium 9.9 8.4 - 10.5 mg/dL   GFR 95.33 >60.00 mL/min  Lipid panel  Result Value Ref Range   Cholesterol 187 0 - 200 mg/dL   Triglycerides 74.0 0.0 - 149.0 mg/dL   HDL 79.60 >39.00 mg/dL   VLDL 14.8 0.0 - 40.0 mg/dL   LDL Cholesterol 93 0 - 99 mg/dL   Total CHOL/HDL Ratio 2    NonHDL 107.40   TSH  Result Value Ref Range   TSH 5.59 (H) 0.35 - 4.50 uIU/mL      tsh is high  Daughter has thyroid problems  She tends to stay cold  Also loosing hair - in the past year     Patient Active Problem List   Diagnosis Date Noted  . Medicare annual wellness visit, initial 01/02/2015  . Colon cancer screening 05/31/2013  . Hyperkalemia 11/30/2012  . Retinopathy, bilateral 10/05/2012  . Hyperlipidemia, mild 10/05/2012  . Adverse effects of medication 03/24/2012  . Routine general medical examination at a health care facility 01/22/2011  . Left elbow pain 01/22/2011  . HAND PAIN, BILATERAL 07/06/2009  . SLEEP DISORDER 01/18/2007  . POSTHERPETIC NEURALGIA 12/15/2006  . TOBACCO ABUSE 12/15/2006  . CARPAL TUNNEL SYNDROME 12/15/2006  . OSTEOARTHRITIS 12/15/2006  . FIBROMYALGIA 12/15/2006   Past Medical History  Diagnosis Date  . Arthritis     OA  . Fibromyalgia   . Endometriosis   . Tobacco abuse   . Post herpetic neuralgia   . Sleep disorder   . Carpal tunnel syndrome    Past Surgical History  Procedure Laterality Date  . Abdominal hysterectomy  1992    total endometriosis  . Appendectomy  1992  . Bladder tack     Social History  Substance Use Topics  . Smoking status: Current Every Day Smoker -- 0.30 packs/day  . Smokeless tobacco: None  . Alcohol Use: No   Family History  Problem Relation Age of Onset  . Heart disease Mother     MI  . Heart disease Father     MI  . Arthritis Sister     crippling arthritis  . Arthritis Brother     crippling arthritis   Allergies  Allergen Reactions  . Acetaminophen     REACTION: gi upset   Current Outpatient Prescriptions on File Prior to Visit  Medication Sig Dispense Refill  . aspirin 81 MG tablet Take 81 mg by mouth daily.      Marland Kitchen atorvastatin (LIPITOR) 10 MG tablet TAKE 1 TABLET BY MOUTH DAILY 30 tablet 3  . DULoxetine (CYMBALTA) 60 MG capsule TAKE ONE (1) CAPSULE BY MOUTH EACH DAY 30 capsule 3  . HYDROcodone-acetaminophen (NORCO/VICODIN) 5-325 MG per tablet Take 1 tablet by mouth  every 6 (six) hours as needed for moderate pain. 30 tablet 0  .  LYRICA 75 MG capsule TAKE ONE (1) CAPSULE BY MOUTH 2 TIMES DAILY 60 capsule 4  . traMADol (ULTRAM) 50 MG tablet TAKE 1 TABLET BY MOUTH EVERY 8 HOURS AS NEEDED FOR PAIN. 90 tablet 3  . vitamin C (ASCORBIC ACID) 500 MG tablet Take 500 mg by mouth daily.     No current facility-administered medications on file prior to visit.    Review of Systems Review of Systems  Constitutional: Negative for fever, appetite change, fatigue and unexpected weight change.  Eyes: Negative for pain and visual disturbance.  Respiratory: Negative for cough and shortness of breath.   Cardiovascular: Negative for cp or palpitations    Gastrointestinal: Negative for nausea, diarrhea and constipation.  Genitourinary: Negative for urgency and frequency.  Skin: Negative for pallor or rash   MSK pos for baseline myofascial pain  Neurological: Negative for weakness, light-headedness, numbness and headaches.  Hematological: Negative for adenopathy. Does not bruise/bleed easily.  Psychiatric/Behavioral: Negative for dysphoric mood. The patient is not nervous/anxious.         Objective:   Physical Exam  Constitutional: She appears well-developed and well-nourished. No distress.  Well appearing   HENT:  Head: Normocephalic and atraumatic.  Right Ear: External ear normal.  Left Ear: External ear normal.  Mouth/Throat: Oropharynx is clear and moist.  Eyes: Conjunctivae and EOM are normal. Pupils are equal, round, and reactive to light. No scleral icterus.  Neck: Normal range of motion. Neck supple. No JVD present. Carotid bruit is not present. No thyromegaly present.  Cardiovascular: Normal rate, regular rhythm, normal heart sounds and intact distal pulses.  Exam reveals no gallop.   Pulmonary/Chest: Effort normal and breath sounds normal. No respiratory distress. She has no wheezes. She exhibits no tenderness.  Diffusely distant bs   Abdominal: Soft.  Bowel sounds are normal. She exhibits no distension, no abdominal bruit and no mass. There is no tenderness.  Genitourinary: No breast swelling, tenderness, discharge or bleeding.  Breast exam: No mass, nodules, thickening, tenderness, bulging, retraction, inflamation, nipple discharge or skin changes noted.  No axillary or clavicular LA.      Musculoskeletal: Normal range of motion. She exhibits no edema or tenderness.  Lymphadenopathy:    She has no cervical adenopathy.  Neurological: She is alert. She has normal reflexes. No cranial nerve deficit. She exhibits normal muscle tone. Coordination normal.  Skin: Skin is warm and dry. No rash noted. No erythema. No pallor.  Psychiatric: She has a normal mood and affect.          Assessment & Plan:   Problem List Items Addressed This Visit      Other   Abnormal TSH    Mildly high  Some cold intolerance/not new Re check tsh and free T4 in 1 mo         Colon cancer screening    Pt declines colonosopy Given ifob stool kit for screening       Relevant Orders   Fecal occult blood, imunochemical   Hyperlipidemia, mild    Disc goals for lipids and reasons to control them Rev labs with pt Rev low sat fat diet in detail  Overall fairly well controlled       Medicare annual wellness visit, initial - Primary    Reviewed health habits including diet and exercise and skin cancer prevention Reviewed appropriate screening tests for age  Also reviewed health mt list, fam hx and immunization status , as well as social and family history  See HPI Labs reviewed IFOB stool kit -please do for colon cancer screening  prevnar vaccine today  If you are interested in a shingles/zoster vaccine - call your insurance to check on coverage,( you should not get it within 1 month of other vaccines) , then call us for a prescription  for it to take to a pharmacy that gives the shot , or make a nurse visit to get it here depending on your  coverage  Please think about quitting smoking   Take care of yourself       Routine general medical examination at a health care facility    Reviewed health habits including diet and exercise and skin cancer prevention Reviewed appropriate screening tests for age  Also reviewed health mt list, fam hx and immunization status , as well as social and family history   See HPI Labs reviewed IFOB stool kit -please do for colon cancer screening  prevnar vaccine today  If you are interested in a shingles/zoster vaccine - call your insurance to check on coverage,( you should not get it within 1 month of other vaccines) , then call us for a prescription  for it to take to a pharmacy that gives the shot , or make a nurse visit to get it here depending on your coverage  Please think about quitting smoking   Take care of yourself        TOBACCO ABUSE    Disc in detail risks of smoking and possible outcomes including copd, vascular/ heart disease, cancer , respiratory and sinus infections  Pt voices understanding Pt is not interested in quitting  She declines low dose CT for screening for lung cancer        Other Visit Diagnoses    Need for vaccination with 13-polyvalent pneumococcal conjugate vaccine        Relevant Orders    Pneumococcal conjugate vaccine 13-valent (Completed)

## 2015-01-02 NOTE — Progress Notes (Signed)
Pre visit review using our clinic review tool, if applicable. No additional management support is needed unless otherwise documented below in the visit note. 

## 2015-01-03 NOTE — Assessment & Plan Note (Signed)
Disc in detail risks of smoking and possible outcomes including copd, vascular/ heart disease, cancer , respiratory and sinus infections  Pt voices understanding Pt is not interested in quitting  She declines low dose CT for screening for lung cancer

## 2015-01-03 NOTE — Assessment & Plan Note (Signed)
Reviewed health habits including diet and exercise and skin cancer prevention Reviewed appropriate screening tests for age  Also reviewed health mt list, fam hx and immunization status , as well as social and family history   See HPI Labs reviewed IFOB stool kit -please do for colon cancer screening  prevnar vaccine today  If you are interested in a shingles/zoster vaccine - call your insurance to check on coverage,( you should not get it within 1 month of other vaccines) , then call us for a prescription  for it to take to a pharmacy that gives the shot , or make a nurse visit to get it here depending on your coverage  Please think about quitting smoking   Take care of yourself

## 2015-01-03 NOTE — Assessment & Plan Note (Addendum)
Mildly high  Some cold intolerance/not new Re check tsh and free T4 in 1 mo

## 2015-01-03 NOTE — Assessment & Plan Note (Signed)
Reviewed health habits including diet and exercise and skin cancer prevention Reviewed appropriate screening tests for age  Also reviewed health mt list, fam hx and immunization status , as well as social and family history   See HPI Labs reviewed IFOB stool kit -please do for colon cancer screening  prevnar vaccine today  If you are interested in a shingles/zoster vaccine - call your insurance to check on coverage,( you should not get it within 1 month of other vaccines) , then call us for a prescription  for it to take to a pharmacy that gives the shot , or make a nurse visit to get it here depending on your coverage  Please think about quitting smoking   Take care of yourself  

## 2015-01-03 NOTE — Assessment & Plan Note (Signed)
Pt declines colonosopy Given ifob stool kit for screening

## 2015-01-03 NOTE — Assessment & Plan Note (Signed)
Disc goals for lipids and reasons to control them Rev labs with pt Rev low sat fat diet in detail  Overall fairly well controlled

## 2015-01-18 ENCOUNTER — Encounter: Payer: Self-pay | Admitting: Family Medicine

## 2015-01-31 ENCOUNTER — Other Ambulatory Visit (INDEPENDENT_AMBULATORY_CARE_PROVIDER_SITE_OTHER): Payer: Medicare Other

## 2015-01-31 DIAGNOSIS — R7989 Other specified abnormal findings of blood chemistry: Secondary | ICD-10-CM | POA: Diagnosis not present

## 2015-01-31 LAB — T4, FREE: Free T4: 0.6 ng/dL (ref 0.60–1.60)

## 2015-01-31 LAB — TSH: TSH: 3.07 u[IU]/mL (ref 0.35–4.50)

## 2015-02-01 ENCOUNTER — Encounter: Payer: Self-pay | Admitting: *Deleted

## 2015-03-02 ENCOUNTER — Other Ambulatory Visit: Payer: Self-pay | Admitting: Family Medicine

## 2015-03-02 NOTE — Telephone Encounter (Signed)
Px written for call in   

## 2015-03-02 NOTE — Telephone Encounter (Signed)
Rx called in as prescribed 

## 2015-03-02 NOTE — Telephone Encounter (Signed)
Pt had CPE on 01/01/05, Tramadol last refilled on 10/24/14 #90 with 3 additional refills, Lyrica last refilled on 08/01/14 #60 with 4 additional refills, Cymbalta last refilled on 09/05/14 #30 with 3 refills, (lipitor is also due for refill), please advise

## 2015-06-11 DIAGNOSIS — M1009 Idiopathic gout, multiple sites: Secondary | ICD-10-CM | POA: Diagnosis not present

## 2015-08-01 ENCOUNTER — Telehealth: Payer: Self-pay | Admitting: Family Medicine

## 2015-08-01 NOTE — Telephone Encounter (Signed)
LM for pt to sch AWV, CPE, mn

## 2015-08-07 ENCOUNTER — Other Ambulatory Visit: Payer: Self-pay | Admitting: *Deleted

## 2015-08-07 MED ORDER — TRAMADOL HCL 50 MG PO TABS: ORAL_TABLET | ORAL | Status: DC

## 2015-08-07 MED ORDER — PREGABALIN 75 MG PO CAPS: ORAL_CAPSULE | ORAL | Status: DC

## 2015-08-07 NOTE — Telephone Encounter (Signed)
Fax refill request, tramadol last filled on 03/02/15 #90 with 3 additional refills, lyrica last filled on 03/02/15 #60 with 5 additional refills, please advise

## 2015-08-07 NOTE — Telephone Encounter (Signed)
Px written for call in   

## 2015-08-08 NOTE — Telephone Encounter (Signed)
Rxs called in as prescribed  

## 2015-11-22 ENCOUNTER — Ambulatory Visit (INDEPENDENT_AMBULATORY_CARE_PROVIDER_SITE_OTHER): Payer: PPO | Admitting: *Deleted

## 2015-11-22 DIAGNOSIS — Z23 Encounter for immunization: Secondary | ICD-10-CM

## 2016-03-03 ENCOUNTER — Other Ambulatory Visit: Payer: Self-pay | Admitting: Family Medicine

## 2016-03-04 NOTE — Telephone Encounter (Signed)
All meds are due for a refill, pt has CPE scheduled 04/28/16, please advise

## 2016-03-04 NOTE — Telephone Encounter (Signed)
Rxs called in as prescribed  

## 2016-03-04 NOTE — Telephone Encounter (Signed)
Px written for call in   

## 2016-03-07 ENCOUNTER — Ambulatory Visit (INDEPENDENT_AMBULATORY_CARE_PROVIDER_SITE_OTHER): Payer: PPO

## 2016-03-07 ENCOUNTER — Encounter: Payer: Self-pay | Admitting: Podiatry

## 2016-03-07 ENCOUNTER — Ambulatory Visit (INDEPENDENT_AMBULATORY_CARE_PROVIDER_SITE_OTHER): Payer: PPO | Admitting: Podiatry

## 2016-03-07 VITALS — Ht 65.0 in | Wt 140.0 lb

## 2016-03-07 DIAGNOSIS — M2011 Hallux valgus (acquired), right foot: Secondary | ICD-10-CM

## 2016-03-07 DIAGNOSIS — T8484XA Pain due to internal orthopedic prosthetic devices, implants and grafts, initial encounter: Secondary | ICD-10-CM

## 2016-03-07 DIAGNOSIS — M79671 Pain in right foot: Secondary | ICD-10-CM

## 2016-03-07 DIAGNOSIS — M2041 Other hammer toe(s) (acquired), right foot: Secondary | ICD-10-CM | POA: Diagnosis not present

## 2016-03-07 DIAGNOSIS — M21629 Bunionette of unspecified foot: Secondary | ICD-10-CM | POA: Diagnosis not present

## 2016-03-07 DIAGNOSIS — M21611 Bunion of right foot: Secondary | ICD-10-CM | POA: Diagnosis not present

## 2016-03-07 DIAGNOSIS — R52 Pain, unspecified: Secondary | ICD-10-CM

## 2016-03-07 NOTE — Patient Instructions (Signed)
Pre-Operative Instructions  Congratulations, you have decided to take an important step to improving your quality of life.  You can be assured that the doctors of [facility] will be with you every step of the way.  1. Plan to be at the surgery center/hospital at least 1 (one) hour prior to your scheduled time unless otherwise directed by the surgical center/hospital staff.  You must have a responsible adult accompany you, remain during the surgery and drive you home.  Make sure you have directions to the surgical center/hospital and know how to get there on time. 2. For hospital based surgery you will need to obtain a history and physical form from your family physician within 1 month prior to the date of surgery- we will give you a form for you primary physician.  3. We make every effort to accommodate the date you request for surgery.  There are however, times where surgery dates or times have to be moved.  We will contact you as soon as possible if a change in schedule is required.   4. No Aspirin/Ibuprofen for one week before surgery.  If you are on aspirin, any non-steroidal anti-inflammatory medications (Mobic, Aleve, Ibuprofen) you should stop taking it 7 days prior to your surgery.  You make take Tylenol  For pain prior to surgery.  5. Medications- If you are taking daily heart and blood pressure medications, seizure, reflux, allergy, asthma, anxiety, pain or diabetes medications, make sure the surgery center/hospital is aware before the day of surgery so they may notify you which medications to take or avoid the day of surgery. 6. No food or drink after midnight the night before surgery unless directed otherwise by surgical center/hospital staff. 7. No alcoholic beverages 24 hours prior to surgery.  No smoking 24 hours prior to or 24 hours after surgery. 8. Wear loose pants or shorts- loose enough to fit over bandages, boots, and casts. 9. No slip on shoes, sneakers are best. 10. Bring  your boot with you to the surgery center/hospital.  Also bring crutches or a walker if your physician has prescribed it for you.  If you do not have this equipment, it will be provided for you after surgery. 11. If you have not been contracted by the surgery center/hospital by the day before your surgery, call to confirm the date and time of your surgery. 12. Leave-time from work may vary depending on the type of surgery you have.  Appropriate arrangements should be made prior to surgery with your employer. 13. Prescriptions will be provided immediately following surgery by your doctor.  Have these filled as soon as possible after surgery and take the medication as directed. 14. Remove nail polish on the operative foot. 15. Wash the night before surgery.  The night before surgery wash the foot and leg well with the antibacterial soap provided and water paying special attention to beneath the toenails and in between the toes.  Rinse thoroughly with water and dry well with a towel.  Perform this wash unless told not to do so by your physician.  Enclosed: 1 Ice pack (please put in freezer the night before surgery)   1 Hibiclens skin cleaner   Pre-op Instructions  If you have any questions regarding the instructions, do not hesitate to call our office.  [place]: [address] [place], [place] [address] [phone]  [place]: [address] [place], [place] [address] [phone]  [place]: [address]  [place], [place] [address] [phone]   Dr.   Norman Regal DPM, Dr. Matthew Wagoner DPM, Dr. M. Todd Hyatt DPM, Dr. Titorya Stover DPM 

## 2016-03-07 NOTE — Progress Notes (Signed)
Subjective:  Patient presents today for complaint of pain and tenderness to the right foot. Patient states that she underwent bunionectomy surgery by Raliegh Ip approximately 15 years ago to the right foot. Patient states that over the past 6 months she is noticed a lesion developing over the first metatarsal the right foot. Patient states that is very painful with shoe gear and rubbing. Patient also complains of symptomatic hammertoe deformities and tailor's bunion deformities with bother her on occasion. Patient presents today for further treatment and evaluation    Objective/Physical Exam General: The patient is alert and oriented x3 in no acute distress.  Dermatology: Small localized discoloration with callus formation noted overlying the head of the first metatarsal of the right foot consistent with radiographic findings of a internal fixation K wire which is backing out. Skin is warm, dry and supple bilateral lower extremities. Negative for open lesions or macerations.  Vascular: Palpable pedal pulses bilaterally. No edema or erythema noted. Capillary refill within normal limits.  Neurological: Epicritic and protective threshold grossly intact bilaterally.   Musculoskeletal Exam: Range of motion within normal limits to all pedal and ankle joints bilateral. Muscle strength 5/5 in all groups bilateral.  Symptomatic, recurrent bunion deformity noted to the right foot. Rigid hammertoe contracture digits 2-3 right foot. Symptomatic tailor's bunionette deformity right foot with enlargement of the head of the fifth metatarsal.  Radiographic Exam:  Internal fixation K wire noted to the head of the first metatarsal right foot. The wire appears to be prominent at the dorsal aspect of the first metatarsal head consistent with the presenting symptoms of a painful palpable bump. Hallux abductovalgus recurrence deformity noted to the right lower extremity was an intermetatarsal angle greater than  15. Tailor's bunion deformity also noted with a prominent lateral aspect of the head of the fifth metatarsal and an increased intermetatarsal angle between the fourth interspace greater than 8 right foot.  Assessment: #1 painful orthopedic hardware right foot #2 hallux abductovalgus with bunion deformity right foot #3 rigid hammertoe contracture deformity digits 2-3 right foot #4 tailor's bunionette deformity right foot #5 pain in right foot   Plan of Care:  #1 Patient was evaluated. #2 today I did instructed the patient that she will likely need the K wire removed from her right foot since it is symptomatic and backing out.  #3 today we also discussed the conservative versus surgical management of the recurrent bunion deformity as well as hammertoe contractures and tailor's bunion all of the right foot. Since we are going to surgery to remove the painful K wire, the patient would like to go ahead and proceed with surgical correction of the recurrent bunion deformity as well as hammertoe contracture and tailor's bunion to the right foot. All details and possible complications of the surgery were explained. No guarantees were expressed or implied. #4 surgery will consist of: -Removal painful hardware 1 right foot -Bunionectomy first metatarsal osteotomy right foot -Hammertoe correction digits 2-3 right foot -MPJ capsulotomy 2-3 right foot -Tailor's bunionectomy right foot #5 authorization for surgery initiated today #6 return to clinic 1 week postop   Edrick Kins, DPM [facility]  Dr. Edrick Kins, [address]                                        [place], [place] [address]  Office [phone]  Fax [phone]

## 2016-03-13 ENCOUNTER — Telehealth: Payer: Self-pay | Admitting: *Deleted

## 2016-03-13 NOTE — Telephone Encounter (Signed)
"  I want to set up an appointment for my surgery."

## 2016-03-14 NOTE — Telephone Encounter (Signed)
"  I am calling to schedule my surgery."  When would you like to schedule?  "I would like to do it on January 25."  He does not have anything available on January 25 but he can do it February 1.  "That date will be fine."  I will get it scheduled.  You can go ahead and register with the surgical center on-line, instructions are in the brochure in your bag.  Someone from the surgical center will call you with the arrival time.

## 2016-03-21 NOTE — Telephone Encounter (Signed)
"  There's a Virga Haltiwanger that is supposed to be on the schedule.  Can you send me her stuff."

## 2016-04-03 HISTORY — PX: FOOT SURGERY: SHX648

## 2016-04-07 ENCOUNTER — Encounter: Payer: Medicare Other | Admitting: Family Medicine

## 2016-04-07 ENCOUNTER — Encounter: Payer: Self-pay | Admitting: Podiatry

## 2016-04-07 DIAGNOSIS — M2041 Other hammer toe(s) (acquired), right foot: Secondary | ICD-10-CM | POA: Diagnosis not present

## 2016-04-07 DIAGNOSIS — E7800 Pure hypercholesterolemia, unspecified: Secondary | ICD-10-CM | POA: Diagnosis not present

## 2016-04-07 DIAGNOSIS — M2011 Hallux valgus (acquired), right foot: Secondary | ICD-10-CM | POA: Diagnosis not present

## 2016-04-07 DIAGNOSIS — M21621 Bunionette of right foot: Secondary | ICD-10-CM | POA: Diagnosis not present

## 2016-04-07 DIAGNOSIS — M7751 Other enthesopathy of right foot: Secondary | ICD-10-CM | POA: Diagnosis not present

## 2016-04-07 DIAGNOSIS — M2021 Hallux rigidus, right foot: Secondary | ICD-10-CM | POA: Diagnosis not present

## 2016-04-07 DIAGNOSIS — M25571 Pain in right ankle and joints of right foot: Secondary | ICD-10-CM | POA: Diagnosis not present

## 2016-04-07 DIAGNOSIS — M21611 Bunion of right foot: Secondary | ICD-10-CM | POA: Diagnosis not present

## 2016-04-11 ENCOUNTER — Encounter: Payer: PPO | Admitting: Podiatry

## 2016-04-17 ENCOUNTER — Encounter: Payer: PPO | Admitting: Podiatry

## 2016-04-18 ENCOUNTER — Ambulatory Visit (INDEPENDENT_AMBULATORY_CARE_PROVIDER_SITE_OTHER): Payer: Self-pay | Admitting: Podiatry

## 2016-04-18 ENCOUNTER — Encounter: Payer: PPO | Admitting: Podiatry

## 2016-04-18 ENCOUNTER — Ambulatory Visit (INDEPENDENT_AMBULATORY_CARE_PROVIDER_SITE_OTHER): Payer: PPO

## 2016-04-18 VITALS — BP 107/61 | HR 67 | Temp 98.5°F | Resp 16

## 2016-04-18 DIAGNOSIS — M21611 Bunion of right foot: Secondary | ICD-10-CM | POA: Diagnosis not present

## 2016-04-18 DIAGNOSIS — M2011 Hallux valgus (acquired), right foot: Secondary | ICD-10-CM | POA: Diagnosis not present

## 2016-04-18 DIAGNOSIS — Z9889 Other specified postprocedural states: Secondary | ICD-10-CM

## 2016-04-18 DIAGNOSIS — M21629 Bunionette of unspecified foot: Secondary | ICD-10-CM | POA: Diagnosis not present

## 2016-04-18 DIAGNOSIS — M2041 Other hammer toe(s) (acquired), right foot: Secondary | ICD-10-CM

## 2016-04-18 NOTE — Progress Notes (Signed)
Subjective: Patient presents today status post surgery to the right foot. Patient is doing well. Patient denies fever chills or pain. Date of surgery 04/07/2016.  Objective: Skin incisions well coapted with sutures intact. Moderate edema noted. No signs of infectious process noted. Negative for significant drainage.  Assessment: Status post surgery right foot. Date of surgery 04/07/2016. Next  Plan of care: Today dressings were changed patient was evaluated. Return to clinic in 1 week. Keep dressings clean dry and intact. Weightbearing as tolerated in postoperative shoe.

## 2016-04-24 ENCOUNTER — Encounter: Payer: PPO | Admitting: Podiatry

## 2016-04-25 ENCOUNTER — Ambulatory Visit (INDEPENDENT_AMBULATORY_CARE_PROVIDER_SITE_OTHER): Payer: Self-pay | Admitting: Podiatry

## 2016-04-25 DIAGNOSIS — Z9889 Other specified postprocedural states: Secondary | ICD-10-CM

## 2016-04-25 MED ORDER — MUPIROCIN CALCIUM 2 % EX CREA: TOPICAL_CREAM | Freq: Two times a day (BID) | CUTANEOUS | Status: DC

## 2016-04-28 ENCOUNTER — Encounter: Payer: Self-pay | Admitting: Family Medicine

## 2016-04-28 ENCOUNTER — Ambulatory Visit (INDEPENDENT_AMBULATORY_CARE_PROVIDER_SITE_OTHER): Payer: PPO | Admitting: Family Medicine

## 2016-04-28 VITALS — BP 106/64 | HR 65 | Temp 97.9°F | Wt 144.0 lb

## 2016-04-28 DIAGNOSIS — E785 Hyperlipidemia, unspecified: Secondary | ICD-10-CM | POA: Diagnosis not present

## 2016-04-28 DIAGNOSIS — Z0000 Encounter for general adult medical examination without abnormal findings: Secondary | ICD-10-CM | POA: Diagnosis not present

## 2016-04-28 DIAGNOSIS — E2839 Other primary ovarian failure: Secondary | ICD-10-CM | POA: Diagnosis not present

## 2016-04-28 DIAGNOSIS — F17200 Nicotine dependence, unspecified, uncomplicated: Secondary | ICD-10-CM | POA: Diagnosis not present

## 2016-04-28 DIAGNOSIS — Z1231 Encounter for screening mammogram for malignant neoplasm of breast: Secondary | ICD-10-CM

## 2016-04-28 LAB — CBC WITH DIFFERENTIAL/PLATELET
BASOS ABS: 0 10*3/uL (ref 0.0–0.1)
Basophils Relative: 0.8 % (ref 0.0–3.0)
EOS PCT: 5.9 % — AB (ref 0.0–5.0)
Eosinophils Absolute: 0.3 10*3/uL (ref 0.0–0.7)
HEMATOCRIT: 41.8 % (ref 36.0–46.0)
Hemoglobin: 14 g/dL (ref 12.0–15.0)
LYMPHS PCT: 39.7 % (ref 12.0–46.0)
Lymphs Abs: 2.2 10*3/uL (ref 0.7–4.0)
MCHC: 33.6 g/dL (ref 30.0–36.0)
MCV: 91.5 fl (ref 78.0–100.0)
MONOS PCT: 6.7 % (ref 3.0–12.0)
Monocytes Absolute: 0.4 10*3/uL (ref 0.1–1.0)
Neutro Abs: 2.6 10*3/uL (ref 1.4–7.7)
Neutrophils Relative %: 46.9 % (ref 43.0–77.0)
Platelets: 288 10*3/uL (ref 150.0–400.0)
RBC: 4.57 Mil/uL (ref 3.87–5.11)
RDW: 14.3 % (ref 11.5–15.5)
WBC: 5.6 10*3/uL (ref 4.0–10.5)

## 2016-04-28 LAB — COMPREHENSIVE METABOLIC PANEL
ALBUMIN: 4 g/dL (ref 3.5–5.2)
ALK PHOS: 76 U/L (ref 39–117)
ALT: 13 U/L (ref 0–35)
AST: 15 U/L (ref 0–37)
BILIRUBIN TOTAL: 0.4 mg/dL (ref 0.2–1.2)
BUN: 10 mg/dL (ref 6–23)
CALCIUM: 9.8 mg/dL (ref 8.4–10.5)
CO2: 28 mEq/L (ref 19–32)
Chloride: 105 mEq/L (ref 96–112)
Creatinine, Ser: 0.67 mg/dL (ref 0.40–1.20)
GFR: 93.31 mL/min (ref >60.00)
Glucose, Bld: 99 mg/dL (ref 70–99)
Potassium: 4.8 mEq/L (ref 3.5–5.1)
Sodium: 136 mEq/L (ref 135–145)
TOTAL PROTEIN: 6.7 g/dL (ref 6.0–8.3)

## 2016-04-28 LAB — LIPID PANEL
CHOLESTEROL: 224 mg/dL — AB (ref 0–200)
HDL: 89.8 mg/dL (ref >39.00)
LDL Cholesterol: 123 mg/dL — ABNORMAL HIGH (ref 0–99)
NONHDL: 134.09
TRIGLYCERIDES: 55 mg/dL (ref 0.0–149.0)
Total CHOL/HDL Ratio: 2
VLDL: 11 mg/dL (ref 0.0–40.0)

## 2016-04-28 LAB — TSH: TSH: 3.77 u[IU]/mL (ref 0.35–4.50)

## 2016-04-28 MED ORDER — DULOXETINE HCL 60 MG PO CPEP: 60.0000 mg | ORAL_CAPSULE | Freq: Every day | ORAL | 3 refills | Status: DC

## 2016-04-28 MED ORDER — ATORVASTATIN CALCIUM 10 MG PO TABS: 10.0000 mg | ORAL_TABLET | Freq: Every day | ORAL | 3 refills | Status: DC

## 2016-04-28 MED ORDER — PREGABALIN 75 MG PO CAPS: ORAL_CAPSULE | ORAL | 3 refills | Status: DC

## 2016-04-28 NOTE — Progress Notes (Signed)
Subjective:    Patient ID: [name] [name], female    DOB: [birth year], 66 y.o.   MRN: [id number]  HPI  Here for health maintenance exam and to review chronic medical problems   Had foot surgery on the R - had to have a pin removed from big toe Still wearing a post op shoe  Also straightened 2 other toes and fixed a bunion  The new pins are in for at least 2 more weeks    Has been doing fine otherwise   Wt Readings from Last 3 Encounters:  04/28/16 144 lb (65.3 kg)  03/07/16 140 lb (63.5 kg)  01/02/15 142 lb 8 oz (64.6 kg)   bmi is 23.9  Hep C screen declined /low risk   Mammogram-wants to go ahead and do one this year  Self breast exam -no lumps   Pap/gyn care : nl pap 04 (tot hyst in the past)  No symptoms or problems at all   Zoster vaccine - would be interested if ins covers  Has had shingles once before  Flu vaccine 9/17 Tetanus vaccine 1/09 PNA due for ppsv23  Colonoscopy/ screening : IFOB 4/15 neg Not interested in a colonoscopy  Will do cologuard screen   dexa - is interested  No falls  No fractures Is a smoker   Smoking status -smokes 1/2 ppd  Not ready to quit at all   Hx of mild hyperlipidemia  Lab Results  Component Value Date   CHOL 187 12/26/2014   HDL 79.60 12/26/2014   Mitchell 93 12/26/2014   LDLDIRECT 127.3 01/22/2011   TRIG 74.0 12/26/2014   CHOLHDL 2 12/26/2014   On lipitor and diet  Due for labs  Watches her diet for cholesterol  Exercises as much as she can   Patient Active Problem List   Diagnosis Date Noted  . Screening mammogram, encounter for 04/28/2016  . Estrogen deficiency 04/28/2016  . Medicare annual wellness visit, initial 01/02/2015  . Colon cancer screening 05/31/2013  . Hyperkalemia 11/30/2012  . Retinopathy, bilateral 10/05/2012  . Hyperlipidemia, mild 10/05/2012  . Adverse effects of medication 03/24/2012  . Routine general medical examination at a health care facility 01/22/2011  . Left elbow pain  01/22/2011  . HAND PAIN, BILATERAL 07/06/2009  . SLEEP DISORDER 01/18/2007  . POSTHERPETIC NEURALGIA 12/15/2006  . TOBACCO ABUSE 12/15/2006  . CARPAL TUNNEL SYNDROME 12/15/2006  . OSTEOARTHRITIS 12/15/2006  . FIBROMYALGIA 12/15/2006   Past Medical History:  Diagnosis Date  . Arthritis    OA  . Carpal tunnel syndrome   . Endometriosis   . Fibromyalgia   . Post herpetic neuralgia   . Sleep disorder   . Tobacco abuse    Past Surgical History:  Procedure Laterality Date  . ABDOMINAL HYSTERECTOMY  1992   total endometriosis  . APPENDECTOMY  1992  . bladder tack     Social History  Substance Use Topics  . Smoking status: Current Every Day Smoker    Packs/day: 0.30  . Smokeless tobacco: Never Used  . Alcohol use No   Family History  Problem Relation Age of Onset  . Heart disease Mother     MI  . Heart disease Father     MI  . Arthritis Sister     crippling arthritis  . Arthritis Brother     crippling arthritis   Allergies  Allergen Reactions  . Acetaminophen     REACTION: gi upset   Current Outpatient Prescriptions on File Prior  to Visit  Medication Sig Dispense Refill  . aspirin 81 MG tablet Take 81 mg by mouth daily.      . traMADol (ULTRAM) 50 MG tablet TAKE ONE TABLET BY MOUTH EVERY EIGHT HOURS 90 tablet 1  . vitamin C (ASCORBIC ACID) 500 MG tablet Take 500 mg by mouth daily.     Current Facility-Administered Medications on File Prior to Visit  Medication Dose Route Frequency Provider Last Rate Last Dose  . mupirocin cream (BACTROBAN) 2 %   Topical BID Edrick Kins, DPM        Review of Systems    Review of Systems  Constitutional: Negative for fever, appetite change, fatigue and unexpected weight change.  Eyes: Negative for pain and visual disturbance.  Respiratory: Negative for cough and shortness of breath.   Cardiovascular: Negative for cp or palpitations    Gastrointestinal: Negative for nausea, diarrhea and constipation.  Genitourinary: Negative  for urgency and frequency.  Skin: Negative for pallor or rash   MSK pos for chronic myofascial pain , pos for r post op foot pain that is improving  Neurological: Negative for weakness, light-headedness, numbness and headaches.  Hematological: Negative for adenopathy. Does not bruise/bleed easily.  Psychiatric/Behavioral: Negative for dysphoric mood. The patient is not nervous/anxious.      Objective:   Physical Exam  Constitutional: She appears well-developed and well-nourished. No distress.  Well appearing   HENT:  Head: Normocephalic and atraumatic.  Right Ear: External ear normal.  Left Ear: External ear normal.  Mouth/Throat: Oropharynx is clear and moist.  Eyes: Conjunctivae and EOM are normal. Pupils are equal, round, and reactive to light. No scleral icterus.  Neck: Normal range of motion. Neck supple. No JVD present. Carotid bruit is not present. No thyromegaly present.  Cardiovascular: Normal rate, regular rhythm, normal heart sounds and intact distal pulses.  Exam reveals no gallop.   Pulmonary/Chest: Effort normal and breath sounds normal. No respiratory distress. She has no wheezes. She exhibits no tenderness.  Diffusely distant bs  No wheeze  Abdominal: Soft. Bowel sounds are normal. She exhibits no distension, no abdominal bruit and no mass. There is no tenderness.  Genitourinary: No breast swelling, tenderness, discharge or bleeding.  Genitourinary Comments: Breast exam: No mass, nodules, thickening, tenderness, bulging, retraction, inflamation, nipple discharge or skin changes noted.  No axillary or clavicular LA.      Musculoskeletal: Normal range of motion. She exhibits no edema or tenderness.  No kyphosis   Myofascial sensitivity  Lymphadenopathy:    She has no cervical adenopathy.  Neurological: She is alert. She has normal reflexes. No cranial nerve deficit. She exhibits normal muscle tone. Coordination normal.  Skin: Skin is warm and dry. No rash noted. No  erythema. No pallor.  Solar lentigines diffusely  Psychiatric: She has a normal mood and affect.          Assessment & Plan:   Problem List Items Addressed This Visit      Other   Estrogen deficiency    Ref for dexa       Relevant Orders   DG Bone Density   Hyperlipidemia, mild    Due for labs On atorvastatin and diet  Disc goals       Relevant Medications   atorvastatin (LIPITOR) 10 MG tablet   Routine general medical examination at a health care facility - Primary    Reviewed health habits including diet and exercise and skin cancer prevention Reviewed appropriate screening tests for age  Also reviewed health mt list, fam hx and immunization status , as well as social and family history   See HPI Labs ordered Ref for mammogram  No gyn c/o  Will look into zoster vaccine if she has ins coverage Due for pna 23 Declines colonoscopy -ordered cologuard screening program  dexa ordered-high risk of op due to smoking  Counseled on smoking cessation-not interested  Will schedule AMW visit         Relevant Orders   CBC with Differential/Platelet (Completed)   Comprehensive metabolic panel (Completed)   Lipid panel (Completed)   TSH (Completed)   Screening mammogram, encounter for    Scheduled annual screening mammogram Nl breast exam today  Encouraged monthly self exams        Relevant Orders   MM DIGITAL SCREENING BILATERAL   TOBACCO ABUSE    Disc in detail risks of smoking and possible outcomes including copd, vascular/ heart disease, cancer , respiratory and sinus infections  Pt voices understanding Pt is not interested in quitting No respiratory c/o

## 2016-04-28 NOTE — Assessment & Plan Note (Signed)
Disc in detail risks of smoking and possible outcomes including copd, vascular/ heart disease, cancer , respiratory and sinus infections  Pt voices understanding Pt is not interested in quitting No respiratory c/o

## 2016-04-28 NOTE — Progress Notes (Signed)
Pre visit review using our clinic review tool, if applicable. No additional management support is needed unless otherwise documented below in the visit note. 

## 2016-04-28 NOTE — Assessment & Plan Note (Signed)
Scheduled annual screening mammogram Nl breast exam today  Encouraged monthly self exams   

## 2016-04-28 NOTE — Patient Instructions (Addendum)
Stop at check out for ref for mammogram and bone density test  If you are interested in a shingles/zoster vaccine - call your insurance to check on coverage,( you should not get it within 1 month of other vaccines) , then call us for a prescription  for it to take to a pharmacy that gives the shot , or make a nurse visit to get it here depending on your coverage We will sign you up for the cologuard program  Try to get 1200-1500 mg of calcium per day with at least 1000 iu of vitamin D - for bone health  Labs today  Let us know when you are ready to quit smoking-think about it

## 2016-04-28 NOTE — Assessment & Plan Note (Signed)
Due for labs On atorvastatin and diet  Disc goals

## 2016-04-28 NOTE — Assessment & Plan Note (Signed)
Ref for dexa 

## 2016-04-28 NOTE — Assessment & Plan Note (Addendum)
Reviewed health habits including diet and exercise and skin cancer prevention Reviewed appropriate screening tests for age  Also reviewed health mt list, fam hx and immunization status , as well as social and family history   See HPI Labs ordered Ref for mammogram  No gyn c/o  Will look into zoster vaccine if she has ins coverage Due for pna 23 Declines colonoscopy -ordered cologuard screening program  dexa ordered-high risk of op due to smoking  Counseled on smoking cessation-not interested  Will schedule AMW visit

## 2016-04-29 NOTE — Progress Notes (Signed)
Subjective: Patient presents today status post surgery to the right foot. Patient is doing well. Patient denies fever chills or pain. Date of surgery 04/07/2016.  Objective: Skin incisions well coapted with sutures intact. Moderate edema noted. No signs of infectious process noted. Negative for significant drainage.  Assessment: Status post surgery right foot. Date of surgery 04/07/2016.   Plan of care: Today the patient was evaluated. Status still skin staples were removed. Dry sterile dressing applied. Return to clinic in 2 weeks for pin removal. We will initiate physical therapy after her cutaneous pin removal.

## 2016-04-30 NOTE — Progress Notes (Signed)
DOS 02.05.2018 Removal of painful hardware right foot. Bunionectomy right foot. Hammertoe repair digits two, three right . Tailors bunionectomy right.

## 2016-05-06 ENCOUNTER — Ambulatory Visit (INDEPENDENT_AMBULATORY_CARE_PROVIDER_SITE_OTHER): Payer: Self-pay | Admitting: Podiatry

## 2016-05-06 ENCOUNTER — Encounter: Payer: Self-pay | Admitting: Podiatry

## 2016-05-06 DIAGNOSIS — Z9889 Other specified postprocedural states: Secondary | ICD-10-CM

## 2016-05-20 NOTE — Progress Notes (Signed)
Subjective: Patient presents today status post surgery to the right foot. Patient is doing well. Patient denies fever chills or pain. Date of surgery 04/07/2016.  Objective: Skin incisions well coapted. Moderate edema noted. No signs of infectious process noted. Negative for significant drainage.  Assessment: Status post surgery right foot. Date of surgery 04/07/2016.   Plan of care: Today the patient was evaluated. Percutaneous fixation pins were removed today. Patient will initiate physical therapy at home to increase range of motion to the metatarsophalangeal joint. Return to clinic in 4 weeks

## 2016-06-03 ENCOUNTER — Ambulatory Visit (INDEPENDENT_AMBULATORY_CARE_PROVIDER_SITE_OTHER): Payer: PPO

## 2016-06-03 ENCOUNTER — Ambulatory Visit (INDEPENDENT_AMBULATORY_CARE_PROVIDER_SITE_OTHER): Payer: PPO | Admitting: Podiatry

## 2016-06-03 DIAGNOSIS — M2041 Other hammer toe(s) (acquired), right foot: Secondary | ICD-10-CM | POA: Diagnosis not present

## 2016-06-03 DIAGNOSIS — M21611 Bunion of right foot: Secondary | ICD-10-CM | POA: Diagnosis not present

## 2016-06-03 DIAGNOSIS — M21629 Bunionette of unspecified foot: Secondary | ICD-10-CM

## 2016-06-03 DIAGNOSIS — M2011 Hallux valgus (acquired), right foot: Secondary | ICD-10-CM

## 2016-06-03 DIAGNOSIS — Z9889 Other specified postprocedural states: Secondary | ICD-10-CM

## 2016-06-04 NOTE — Progress Notes (Signed)
Subjective: Patient presents today status post surgery to the right foot. Patient states that she's doing well with wearing shoes. She still has some slight pain and swelling to the foot. Date of surgery 04/07/2016.  Objective: Skin incisions well coapted. Moderate edema noted. No signs of infectious process noted. Negative for significant drainage.  Assessment: Status post surgery right foot. Date of surgery 04/07/2016.   Plan of care: Patient was evaluated today. We're going to consider the patient healed today. Compression anklet was dispensed today. Return to clinic when necessary  Edrick Kins, DPM [facility]  Dr. Edrick Kins, [address]                                        [place], [place] [address]                Office [phone]  Fax [phone]

## 2016-06-12 ENCOUNTER — Ambulatory Visit
Admission: RE | Admit: 2016-06-12 | Discharge: 2016-06-12 | Disposition: A | Discharge status: Home / Self Care | Payer: PPO | Source: Ambulatory Visit | Attending: Family Medicine | Admitting: Family Medicine

## 2016-06-12 DIAGNOSIS — E2839 Other primary ovarian failure: Secondary | ICD-10-CM | POA: Insufficient documentation

## 2016-06-12 DIAGNOSIS — Z1231 Encounter for screening mammogram for malignant neoplasm of breast: Secondary | ICD-10-CM | POA: Diagnosis not present

## 2016-06-12 DIAGNOSIS — M85852 Other specified disorders of bone density and structure, left thigh: Secondary | ICD-10-CM | POA: Insufficient documentation

## 2016-06-13 ENCOUNTER — Encounter: Payer: Self-pay | Admitting: *Deleted

## 2016-06-15 ENCOUNTER — Encounter: Payer: Self-pay | Admitting: Family Medicine

## 2016-06-15 DIAGNOSIS — M8580 Other specified disorders of bone density and structure, unspecified site: Secondary | ICD-10-CM | POA: Insufficient documentation

## 2016-08-27 ENCOUNTER — Other Ambulatory Visit: Payer: Self-pay | Admitting: Family Medicine

## 2016-08-28 NOTE — Telephone Encounter (Signed)
Px written for call in   

## 2016-08-28 NOTE — Telephone Encounter (Signed)
CPE was on 04/28/16, last filled on 04/28/16 #180 caps with 3 refills, but spoke to [place] at [facility] and pt does have refills on file but due to it being a controlled sub the Rx is only good for 6 months and then they can't use it anymore and need a new refill sent in, please advise

## 2016-08-29 ENCOUNTER — Other Ambulatory Visit: Payer: Self-pay | Admitting: Family Medicine

## 2016-08-29 NOTE — Telephone Encounter (Signed)
Rx called in as prescribed 

## 2016-09-04 NOTE — Progress Notes (Signed)
Subjective:   [name] [name] is a 66 y.o. female who presents for Medicare Annual (Subsequent) preventive examination.  Review of Systems:  No ROS.  Medicare Wellness Visit. Additional risk factors are reflected in the social history.  Cardiac Risk Factors include: advanced age (>56men, >53 women)     Objective:     Vitals: BP 110/64   Pulse 64   Ht 5' 4.5" (1.638 m)   Wt 145 lb 4 oz (65.9 kg)   SpO2 98%   BMI 24.55 kg/m   Body mass index is 24.55 kg/m.   Tobacco History  Smoking Status  . Current Every Day Smoker  . Packs/day: 0.30  Smokeless Tobacco  . Never Used     Ready to quit: No Counseling given: Not Answered   Past Medical History:  Diagnosis Date  . Arthritis    OA  . Carpal tunnel syndrome   . Endometriosis   . Fibromyalgia   . Post herpetic neuralgia   . Sleep disorder   . Tobacco abuse    Past Surgical History:  Procedure Laterality Date  . ABDOMINAL HYSTERECTOMY  1992   total endometriosis  . APPENDECTOMY  1992  . bladder tack    . FOOT SURGERY Right 04/2016   [facility] in [place]   Family History  Problem Relation Age of Onset  . Heart disease Mother        MI  . Heart disease Father        MI  . Arthritis Sister        crippling arthritis  . Arthritis Brother        crippling arthritis   History  Sexual Activity  . Sexual activity: Not Currently    Outpatient Encounter Prescriptions as of 09/12/2016  Medication Sig  . aspirin 81 MG tablet Take 81 mg by mouth daily.    Marland Kitchen atorvastatin (LIPITOR) 10 MG tablet Take 1 tablet (10 mg total) by mouth daily.  . DULoxetine (CYMBALTA) 60 MG capsule Take 1 capsule (60 mg total) by mouth daily.  Marland Kitchen LYRICA 75 MG capsule TAKE ONE CAPSULE BY MOUTH TWICE A DAY  . meloxicam (MOBIC) 15 MG tablet Take 15 mg by mouth daily.  Marland Kitchen oxyCODONE-acetaminophen (PERCOCET/ROXICET) 5-325 MG tablet Take 1 tablet by mouth every 4 (four) hours as needed for severe pain.  . traMADol (ULTRAM) 50  MG tablet TAKE ONE TABLET BY MOUTH EVERY EIGHT HOURS  . vitamin C (ASCORBIC ACID) 500 MG tablet Take 500 mg by mouth daily.  . [DISCONTINUED] mupirocin cream (BACTROBAN) 2 %    No facility-administered encounter medications on file as of 09/12/2016.     Activities of Daily Living In your present state of health, do you have any difficulty performing the following activities: 09/12/2016  Hearing? N  Vision? N  Difficulty concentrating or making decisions? N  Walking or climbing stairs? N  Dressing or bathing? N  Doing errands, shopping? N  Preparing Food and eating ? N  Using the Toilet? N  In the past six months, have you accidently leaked urine? N  Do you have problems with loss of bowel control? N  Managing your Medications? N  Managing your Finances? N  Housekeeping or managing your Housekeeping? N  Some recent data might be hidden    Patient Care Team: Tower, [name], MD as PCP - General    Assessment:    Physical assessment deferred to PCP.  Exercise Activities and Dietary recommendations Current Exercise  Habits: Home exercise routine, Type of exercise: walking, Time (Minutes): 30, Frequency (Times/Week): 7, Weekly Exercise (Minutes/Week): 210  Goals    . Increase physical activity          Starting 09/10/2016, I will continue walking 1 mile daily.      Fall Risk Fall Risk  09/12/2016 01/02/2015 05/31/2013  Falls in the past year? No No No   Depression Screen PHQ 2/9 Scores 09/12/2016 01/02/2015 05/31/2013  PHQ - 2 Score 0 0 0     Cognitive Function MMSE - Mini Mental State Exam 09/12/2016  Orientation to time 5  Orientation to Place 5  Registration 3  Attention/ Calculation 0  Recall 3  Language- name 2 objects 0  Language- repeat 1  Language- follow 3 step command 3  Language- read & follow direction 0  Write a sentence 0  Copy design 0  Total score 20        Immunization History  Administered Date(s) Administered  . Influenza Split 01/22/2011,  03/24/2012  . Influenza,inj,Quad PF,36+ Mos 11/30/2012, 12/06/2014, 11/22/2015  . Pneumococcal Conjugate-13 01/02/2015  . Pneumococcal Polysaccharide-23 01/22/2011, 09/12/2016  . Td 05/01/1998, 03/04/2007   Screening Tests Health Maintenance  Topic Date Due  . Fecal DNA (Cologuard)  04/30/1999  . COLON CANCER SCREENING ANNUAL FOBT  06/10/2014  . Hepatitis C Screening  04/04/2020 (Originally 05/18/1950)  . INFLUENZA VACCINE  10/01/2016  . TETANUS/TDAP  03/03/2017  . MAMMOGRAM  06/13/2018  . DEXA SCAN  Completed  . PNA vac Low Risk Adult  Completed      Plan:    Follow-up w/ PCP as scheduled.   I have personally reviewed and noted the following in the patient's chart:   . Medical and social history . Use of alcohol, tobacco or illicit drugs  . Current medications and supplements . Functional ability and status . Nutritional status . Physical activity . Advanced directives . List of other physicians . Vitals . Screenings to include cognitive, depression, and falls . Referrals and appointments  In addition, I have reviewed and discussed with patient certain preventive protocols, quality metrics, and best practice recommendations. A written personalized care plan for preventive services as well as general preventive health recommendations were provided to patient.     Dorrene German, RN  09/12/2016

## 2016-09-12 ENCOUNTER — Ambulatory Visit (INDEPENDENT_AMBULATORY_CARE_PROVIDER_SITE_OTHER): Payer: PPO

## 2016-09-12 VITALS — BP 110/64 | HR 64 | Ht 64.5 in | Wt 145.2 lb

## 2016-09-12 DIAGNOSIS — Z0000 Encounter for general adult medical examination without abnormal findings: Secondary | ICD-10-CM | POA: Diagnosis not present

## 2016-09-12 DIAGNOSIS — Z23 Encounter for immunization: Secondary | ICD-10-CM

## 2016-09-12 NOTE — Patient Instructions (Addendum)
Ms. Corprew , Thank you for taking time to come for your Medicare Wellness Visit. I appreciate your ongoing commitment to your health goals. Please review the following plan we discussed and let me know if I can assist you in the future.   Bring a copy of your advance directives to your next office visit.  These are the goals we discussed: Goals    . Increase physical activity          Starting 09/10/2016, I will continue walking 1 mile daily.       This is a list of the screening recommended for you and due dates:  Health Maintenance  Topic Date Due  . Cologuard (Stool DNA test)  04/30/1999  . Stool Blood Test  06/10/2014  .  Hepatitis C: One time screening is recommended by Center for Disease Control  (CDC) for  adults born from [birth year] through 1966.   04/04/2020*  . Flu Shot  10/01/2016  . Tetanus Vaccine  03/03/2017  . Mammogram  06/13/2018  . DEXA scan (bone density measurement)  Completed  . Pneumonia vaccines  Completed  *Topic was postponed. The date shown is not the original due date.   Preventive Care for Adults  A healthy lifestyle and preventive care can promote health and wellness. Preventive health guidelines for adults include the following key practices.  . A routine yearly physical is a good way to check with your health care provider about your health and preventive screening. It is a chance to share any concerns and updates on your health and to receive a thorough exam.  . Visit your dentist for a routine exam and preventive care every 6 months. Brush your teeth twice a day and floss once a day. Good oral hygiene prevents tooth decay and gum disease.  . The frequency of eye exams is based on your age, health, family medical history, use  of contact lenses, and other factors. Follow your health care provider's ecommendations for frequency of eye exams.  . Eat a healthy diet. Foods like vegetables, fruits, whole grains, low-fat dairy products, and lean protein  foods contain the nutrients you need without too many calories. Decrease your intake of foods high in solid fats, added sugars, and salt. Eat the right amount of calories for you. Get information about a proper diet from your health care provider, if necessary.  . Regular physical exercise is one of the most important things you can do for your health. Most adults should get at least 150 minutes of moderate-intensity exercise (any activity that increases your heart rate and causes you to sweat) each week. In addition, most adults need muscle-strengthening exercises on 2 or more days a week.  Silver Sneakers may be a benefit available to you. To determine eligibility, you may visit the website: www.silversneakers.com or contact program at 205-338-0567 Mon-Fri between 8AM-8PM.   . Maintain a healthy weight. The body mass index (BMI) is a screening tool to identify possible weight problems. It provides an estimate of body fat based on height and weight. Your health care provider can find your BMI and can help you achieve or maintain a healthy weight.   For adults 20 years and older: ? A BMI below 18.5 is considered underweight. ? A BMI of 18.5 to 24.9 is normal. ? A BMI of 25 to 29.9 is considered overweight. ? A BMI of 30 and above is considered obese.   . Maintain normal blood lipids and cholesterol levels by exercising and  minimizing your intake of saturated fat. Eat a balanced diet with plenty of fruit and vegetables. Blood tests for lipids and cholesterol should begin at age 53 and be repeated every 5 years. If your lipid or cholesterol levels are high, you are over 50, or you are at high risk for heart disease, you may need your cholesterol levels checked more frequently. Ongoing high lipid and cholesterol levels should be treated with medicines if diet and exercise are not working.  . If you smoke, find out from your health care provider how to quit. If you do not use tobacco, please do not  start.  . If you choose to drink alcohol, please do not consume more than 2 drinks per day. One drink is considered to be 12 ounces (355 mL) of beer, 5 ounces (148 mL) of wine, or 1.5 ounces (44 mL) of liquor.  . If you are 57-72 years old, ask your health care provider if you should take aspirin to prevent strokes.  . Use sunscreen. Apply sunscreen liberally and repeatedly throughout the day. You should seek shade when your shadow is shorter than you. Protect yourself by wearing long sleeves, pants, a wide-brimmed hat, and sunglasses year round, whenever you are outdoors.  . Once a month, do a whole body skin exam, using a mirror to look at the skin on your back. Tell your health care provider of new moles, moles that have irregular borders, moles that are larger than a pencil eraser, or moles that have changed in shape or color.

## 2016-09-12 NOTE — Progress Notes (Signed)
PCP notes:   Health maintenance: Colon cancer screening due- She attempted Cologuard in March 2018, but sample could not be processed. She declines to repeat testing at this time.  PPSV-23 given today.  Abnormal screenings:  Vision-recommended she schedule a routine eye exam with her eye doctor.   Patient concerns: Worsening pain of hands and knees. Her siblings had 'crippling arthritis' and she is concerned she is now developing similar symptoms. Appointment scheduled w/ PCP to discuss.   Nurse concerns: None   Next PCP appt: 09/16/2016 @ 2:00pm-acute visit for hand pain  I reviewed health advisor's note, was available for consultation, and agree with documentation and plan. Loura Pardon MD

## 2016-09-16 ENCOUNTER — Other Ambulatory Visit: Payer: Self-pay | Admitting: Family Medicine

## 2016-09-16 ENCOUNTER — Ambulatory Visit (INDEPENDENT_AMBULATORY_CARE_PROVIDER_SITE_OTHER)
Admission: RE | Admit: 2016-09-16 | Discharge: 2016-09-16 | Disposition: A | Discharge status: Home / Self Care | Payer: PPO | Source: Ambulatory Visit | Attending: Family Medicine | Admitting: Family Medicine

## 2016-09-16 ENCOUNTER — Encounter: Payer: Self-pay | Admitting: Family Medicine

## 2016-09-16 ENCOUNTER — Ambulatory Visit (INDEPENDENT_AMBULATORY_CARE_PROVIDER_SITE_OTHER): Payer: PPO | Admitting: Family Medicine

## 2016-09-16 VITALS — BP 122/64 | HR 65 | Temp 98.1°F | Ht 64.5 in | Wt 143.0 lb

## 2016-09-16 DIAGNOSIS — M79609 Pain in unspecified limb: Secondary | ICD-10-CM | POA: Diagnosis not present

## 2016-09-16 DIAGNOSIS — M2550 Pain in unspecified joint: Secondary | ICD-10-CM | POA: Diagnosis not present

## 2016-09-16 DIAGNOSIS — M25561 Pain in right knee: Secondary | ICD-10-CM | POA: Diagnosis not present

## 2016-09-16 DIAGNOSIS — W57XXXA Bitten or stung by nonvenomous insect and other nonvenomous arthropods, initial encounter: Secondary | ICD-10-CM | POA: Diagnosis not present

## 2016-09-16 DIAGNOSIS — M79643 Pain in unspecified hand: Secondary | ICD-10-CM | POA: Diagnosis not present

## 2016-09-16 LAB — CBC WITH DIFFERENTIAL/PLATELET
BASOS ABS: 0.1 10*3/uL (ref 0.0–0.1)
BASOS PCT: 0.9 % (ref 0.0–3.0)
EOS ABS: 0.3 10*3/uL (ref 0.0–0.7)
Eosinophils Relative: 5.7 % — ABNORMAL HIGH (ref 0.0–5.0)
HCT: 40.8 % (ref 36.0–46.0)
Hemoglobin: 13.8 g/dL (ref 12.0–15.0)
LYMPHS ABS: 2.2 10*3/uL (ref 0.7–4.0)
Lymphocytes Relative: 41.5 % (ref 12.0–46.0)
MCHC: 33.9 g/dL (ref 30.0–36.0)
MCV: 91.5 fl (ref 78.0–100.0)
MONO ABS: 0.5 10*3/uL (ref 0.1–1.0)
Monocytes Relative: 8.7 % (ref 3.0–12.0)
NEUTROS ABS: 2.3 10*3/uL (ref 1.4–7.7)
NEUTROS PCT: 43.2 % (ref 43.0–77.0)
PLATELETS: 264 10*3/uL (ref 150.0–400.0)
RBC: 4.46 Mil/uL (ref 3.87–5.11)
RDW: 14.4 % (ref 11.5–15.5)
WBC: 5.4 10*3/uL (ref 4.0–10.5)

## 2016-09-16 LAB — SEDIMENTATION RATE: Sed Rate: 10 mm/hr (ref 0–30)

## 2016-09-16 NOTE — Patient Instructions (Signed)
Labs for auto immune joint problems today  Xray of hands and knee  Use ice on knee when it bothers you  Heat on hands  We will make a plan with results   Use cortisone cream on the bug bite- alert [place] if it gets worse

## 2016-09-16 NOTE — Assessment & Plan Note (Signed)
In setting of fibromyalgia Labs for autoimmune joint dz today  xr hands and knee  Plan to follow

## 2016-09-16 NOTE — Assessment & Plan Note (Signed)
With itching and scant redness Scab debrided/no tick present  Enc cleansing with soap and water  otc cortisone cream prn Update if not starting to improve in a week or if worsening

## 2016-09-16 NOTE — Assessment & Plan Note (Signed)
Medial pain worse with walking Suspect OA Xray today  Disc use of knee support and ice prn

## 2016-09-16 NOTE — Assessment & Plan Note (Signed)
All joints Some change of OA in distal phalanges xr and autoimmune labs today  Plan to follow

## 2016-09-16 NOTE — Progress Notes (Signed)
Subjective:    Patient ID: [name] [name], female    DOB: 1950-12-05, 66 y.o.   MRN: [id number]  HPI  Here for bilateral hand pain and R knee pain in the setting of known fibromyalgia   Last hand films showed some OA in 2011 (as well as L wrist)  Sharp pains shooting through hands  Has to pick up objects carefully (pain makes her weak and drop things)  Hurts a lot to make a fist  In the am and pm joints swell  Her proximal pip joints hurt the most  Her thumb MCP joint hurts   Sister (she thinks) has OA  Brother was wheelchair bound with auto immune arthritis   Has locking in L index finger and thumb   She is R hand dominant   No numbness or tingling  No rash or feer    R  knee hurts to walk   (for 2 months) No injury  Walks 1 mile per day- for 2 weks  When up and down stairs feels unstable  Knee is perhaps a little swelling   Patient Active Problem List   Diagnosis Date Noted  . Right knee pain 09/16/2016  . Multiple joint pain 09/16/2016  . Insect bite 09/16/2016  . Osteopenia 06/15/2016  . Screening mammogram, encounter for 04/28/2016  . Estrogen deficiency 04/28/2016  . Medicare annual wellness visit, initial 01/02/2015  . Colon cancer screening 05/31/2013  . Hyperkalemia 11/30/2012  . Retinopathy, bilateral 10/05/2012  . Hyperlipidemia, mild 10/05/2012  . Adverse effects of medication 03/24/2012  . Routine general medical examination at a health care facility 01/22/2011  . Left elbow pain 01/22/2011  . HAND PAIN, BILATERAL 07/06/2009  . SLEEP DISORDER 01/18/2007  . POSTHERPETIC NEURALGIA 12/15/2006  . TOBACCO ABUSE 12/15/2006  . CARPAL TUNNEL SYNDROME 12/15/2006  . OSTEOARTHRITIS 12/15/2006  . FIBROMYALGIA 12/15/2006   Past Medical History:  Diagnosis Date  . Arthritis    OA  . Carpal tunnel syndrome   . Endometriosis   . Fibromyalgia   . Post herpetic neuralgia   . Sleep disorder   . Tobacco abuse    Past Surgical History:  Procedure  Laterality Date  . ABDOMINAL HYSTERECTOMY  1992   total endometriosis  . APPENDECTOMY  1992  . bladder tack    . FOOT SURGERY Right 04/2016   [place] in [place] History  Substance Use Topics  . Smoking status: Current Every Day Smoker    Packs/day: 0.30  . Smokeless tobacco: Never Used  . Alcohol use No   Family History  Problem Relation Age of Onset  . Heart disease Mother        MI  . Heart disease Father        MI  . Arthritis Sister        crippling arthritis  . Arthritis Brother        crippling arthritis   Allergies  Allergen Reactions  . Acetaminophen     REACTION: gi upset   Current Outpatient Prescriptions on File Prior to Visit  Medication Sig Dispense Refill  . aspirin 81 MG tablet Take 81 mg by mouth daily.      Marland Kitchen atorvastatin (LIPITOR) 10 MG tablet Take 1 tablet (10 mg total) by mouth daily. 90 tablet 3  . DULoxetine (CYMBALTA) 60 MG capsule Take 1 capsule (60 mg total) by mouth daily. 90 capsule 3  . LYRICA 75 MG capsule TAKE ONE CAPSULE BY MOUTH  TWICE A DAY 180 capsule 1  . meloxicam (MOBIC) 15 MG tablet Take 15 mg by mouth daily.    Marland Kitchen oxyCODONE-acetaminophen (PERCOCET/ROXICET) 5-325 MG tablet Take 1 tablet by mouth every 4 (four) hours as needed for severe pain.    . traMADol (ULTRAM) 50 MG tablet TAKE ONE TABLET BY MOUTH EVERY EIGHT HOURS 90 tablet 1  . vitamin C (ASCORBIC ACID) 500 MG tablet Take 500 mg by mouth daily.     No current facility-administered medications on file prior to visit.     Review of Systems Review of Systems  Constitutional: Negative for fever, appetite change, fatigue and unexpected weight change.  Eyes: Negative for pain and visual disturbance.  Respiratory: Negative for cough and shortness of breath.   Cardiovascular: Negative for cp or palpitations    Gastrointestinal: Negative for nausea, diarrhea and constipation.  Genitourinary: Negative for urgency and frequency.  Skin: Negative for pallor or  rash  Pos for itchy insect bite on back  msk pos for myofascial and joint pain with some joint swelling Neurological: Negative for weakness, light-headedness, numbness and headaches.  Hematological: Negative for adenopathy. Does not bruise/bleed easily.  Psychiatric/Behavioral: Negative for dysphoric mood. The patient is not nervous/anxious.         Objective:   Physical Exam  Constitutional: She appears well-developed and well-nourished. No distress.  Well appearing   HENT:  Head: Normocephalic and atraumatic.  Mouth/Throat: Oropharynx is clear and moist.  Eyes: Pupils are equal, round, and reactive to light. Conjunctivae and EOM are normal.  Neck: Normal range of motion. Neck supple. No JVD present. Carotid bruit is not present. No thyromegaly present.  Cardiovascular: Normal rate, regular rhythm, normal heart sounds and intact distal pulses.  Exam reveals no gallop.   Pulmonary/Chest: Effort normal and breath sounds normal. No respiratory distress. She has no wheezes. She has no rales.  No crackles  Abdominal: Soft. Bowel sounds are normal. She exhibits no distension, no abdominal bruit and no mass. There is no tenderness.  Musculoskeletal: She exhibits no edema.       Right knee: She exhibits decreased range of motion. She exhibits no swelling, no effusion, no ecchymosis, no deformity, no erythema, no LCL laxity, no bony tenderness and normal meniscus. Tenderness found. Medial joint line tenderness noted.  Tenderness of all hand joints/wrists and phalanges Distal changes of OA Nl perf and innervation   R knee- tender over medial joint line No swelling/eff Mild crepitus No redness or heat Nl gait  Nl drawer/lachman tests for stability   Lymphadenopathy:    She has no cervical adenopathy.  Neurological: She is alert. She has normal reflexes. She displays no atrophy. No sensory deficit. She exhibits normal muscle tone.  Pos tinel tests both wrists   Skin: Skin is warm and dry.  No rash noted.  Small insect bite with scab and erythema on R back  Scab cleaned and debrided-no tick parts found  No streaking or vesicles   Psychiatric: She has a normal mood and affect.          Assessment & Plan:   Problem List Items Addressed This Visit      Other   HAND PAIN, BILATERAL - Primary    All joints Some change of OA in distal phalanges xr and autoimmune labs today  Plan to follow       Relevant Orders   DG HANDS 1 VIEW BILAT BALLCATCHERS (Completed)   Insect bite    With itching and  scant redness Scab debrided/no tick present  Enc cleansing with soap and water  otc cortisone cream prn Update if not starting to improve in a week or if worsening        Multiple joint pain    In setting of fibromyalgia Labs for autoimmune joint dz today  xr hands and knee  Plan to follow       Relevant Orders   ANA   Rheumatoid factor   Sedimentation Rate (Completed)   CBC with Differential/Platelet (Completed)   Right knee pain    Medial pain worse with walking Suspect OA Xray today  Disc use of knee support and ice prn

## 2016-09-17 LAB — RHEUMATOID FACTOR

## 2016-09-17 LAB — ANA: ANA: NEGATIVE

## 2016-09-22 ENCOUNTER — Telehealth: Payer: Self-pay | Admitting: Family Medicine

## 2016-09-22 DIAGNOSIS — M79609 Pain in unspecified limb: Secondary | ICD-10-CM

## 2016-09-22 DIAGNOSIS — M159 Polyosteoarthritis, unspecified: Secondary | ICD-10-CM

## 2016-09-22 NOTE — Telephone Encounter (Signed)
Patient returned Shapale's call about her lab work.

## 2016-09-22 NOTE — Telephone Encounter (Signed)
Pt notified of lab results and Dr. Marliss Coots comments. Pt agrees with referral to Ortho, she would like to see someone in [place] if possible. Please put referral in and I advise pt our [facility] will call to schedule appt

## 2016-09-22 NOTE — Telephone Encounter (Signed)
-----   Message from Abner Greenspan, MD sent at 09/18/2016  1:30 AM EDT ----- Labs are ok including autoimmune labs I think her hand pain is from osteoarthritis  I would like to refer her to orthopedic hand specialist if agreeable, please let me know

## 2016-09-22 NOTE — Telephone Encounter (Signed)
Ref done  Will route to [facility] 

## 2016-09-23 NOTE — Telephone Encounter (Signed)
Appt made and patient aware.  

## 2016-10-06 DIAGNOSIS — M19041 Primary osteoarthritis, right hand: Secondary | ICD-10-CM | POA: Diagnosis not present

## 2016-10-06 DIAGNOSIS — R200 Anesthesia of skin: Secondary | ICD-10-CM | POA: Diagnosis not present

## 2016-10-06 DIAGNOSIS — M19042 Primary osteoarthritis, left hand: Secondary | ICD-10-CM | POA: Diagnosis not present

## 2016-10-16 ENCOUNTER — Telehealth: Payer: Self-pay | Admitting: Family Medicine

## 2016-10-16 NOTE — Telephone Encounter (Signed)
Patient went to appointment with Dr.David Terrial Rhodes, [place].  Patient said Dr.Thomason was very rude and told her he didn't know why she was sent to him because he's sports medicine and he doesn't treat arthritis. Dr.Thomason said patient should be referred to a rheumatologist.  Patient prefers an appointment in [place] or Dr Marliss Coots preference.  Patient said she can go anytime except 10/20/16. Patient is in pain and would like the appointment as soon as possible.

## 2016-10-16 NOTE — Telephone Encounter (Signed)
She has hand pain with osteoarthritis of the hands and I referred her to ortho (not sport med)  Her autoimmune labs for joint disease were negative so I do not think rheumatology would take her  Any ideas ?  Thanks

## 2016-10-17 NOTE — Telephone Encounter (Signed)
Appt made at [facility] with P.A. Gertie Fey for 10/24/16 at 1:15, patient aware.

## 2016-10-24 DIAGNOSIS — M19042 Primary osteoarthritis, left hand: Secondary | ICD-10-CM | POA: Diagnosis not present

## 2016-10-24 DIAGNOSIS — M19041 Primary osteoarthritis, right hand: Secondary | ICD-10-CM | POA: Diagnosis not present

## 2016-11-07 ENCOUNTER — Ambulatory Visit (INDEPENDENT_AMBULATORY_CARE_PROVIDER_SITE_OTHER): Payer: PPO

## 2016-11-07 DIAGNOSIS — Z23 Encounter for immunization: Secondary | ICD-10-CM | POA: Diagnosis not present

## 2016-11-27 ENCOUNTER — Other Ambulatory Visit: Payer: Self-pay | Admitting: Family Medicine

## 2016-11-27 NOTE — Telephone Encounter (Signed)
Pt has f/u appt scheduled on 12/02/16, last filled on 03/04/16 #90 tabs with 1 additional refill please advise

## 2016-11-27 NOTE — Telephone Encounter (Signed)
Px written for call in   

## 2016-11-27 NOTE — Telephone Encounter (Signed)
Rx called in as prescribed 

## 2016-12-02 ENCOUNTER — Encounter: Payer: Self-pay | Admitting: Family Medicine

## 2016-12-02 ENCOUNTER — Ambulatory Visit (INDEPENDENT_AMBULATORY_CARE_PROVIDER_SITE_OTHER): Payer: PPO | Admitting: Family Medicine

## 2016-12-02 VITALS — BP 136/80 | HR 68 | Temp 98.1°F | Ht 64.5 in | Wt 141.5 lb

## 2016-12-02 DIAGNOSIS — M25561 Pain in right knee: Secondary | ICD-10-CM

## 2016-12-02 DIAGNOSIS — M159 Polyosteoarthritis, unspecified: Secondary | ICD-10-CM

## 2016-12-02 NOTE — Patient Instructions (Signed)
Take meloxicam 15 mg daily with food (not aleve) Use ice on knee whenever you can for 10 minutes  We will refer you to physical therapy   Get a neoprene knee sleeve (with hole) -and use when exercising or doing a lot of walking   We can consider a sport medicine consult later if you need it

## 2016-12-02 NOTE — Progress Notes (Signed)
Subjective:    Patient ID: [name] [name], female    DOB: 02/26/1951, 66 y.o.   MRN: [id number]  HPI Here to discuss orthopedic problems   Sent to orthopedics Dr Grandville Silos Per pt told her "he did not deal with arthritis problems"   Then went to a female doctor  Was told joints are too small for injection  Briefly discussed replacements  Still hurting a lot in her hands -all the time   R knee still bothers her  It does swell at times Hurts if on it a lot and if it is damp or cool outside  No arthritis on the R knee   Takes tramadol 50 mg tid  meloxicam once per day  Does not wear a knee brace  Has never done any physical therapy  Hurts worst in both joint lines  No hx of effusion  It pops at times/ makes noise   Walking for exercise - 30 minutes per day  Knee hurts during the walk - maybe it warms up a little - that night will start to ache Does not use ice or heat (does use bio freeze)   XRAY Study Result   CLINICAL DATA:  Knee pain for 2 months, no known injury, initial encounter  EXAM: RIGHT KNEE - COMPLETE 4+ VIEW  COMPARISON:  None.  FINDINGS: No acute fracture or dislocation is noted. No soft tissue abnormality is seen. No joint effusion noted.  IMPRESSION: No acute abnormality noted.   Electronically Signed   By: Inez Catalina M.D.    Patient Active Problem List   Diagnosis Date Noted  . Right knee pain 09/16/2016  . Multiple joint pain 09/16/2016  . Insect bite 09/16/2016  . Osteopenia 06/15/2016  . Screening mammogram, encounter for 04/28/2016  . Estrogen deficiency 04/28/2016  . Medicare annual wellness visit, initial 01/02/2015  . Colon cancer screening 05/31/2013  . Hyperkalemia 11/30/2012  . Retinopathy, bilateral 10/05/2012  . Hyperlipidemia, mild 10/05/2012  . Adverse effects of medication 03/24/2012  . Routine general medical examination at a health care facility 01/22/2011  . Left elbow pain 01/22/2011  . Generalized  osteoarthritis of hand 07/06/2009  . SLEEP DISORDER 01/18/2007  . POSTHERPETIC NEURALGIA 12/15/2006  . TOBACCO ABUSE 12/15/2006  . CARPAL TUNNEL SYNDROME 12/15/2006  . Osteoarthritis 12/15/2006  . FIBROMYALGIA 12/15/2006   Past Medical History:  Diagnosis Date  . Arthritis    OA  . Carpal tunnel syndrome   . Endometriosis   . Fibromyalgia   . Post herpetic neuralgia   . Sleep disorder   . Tobacco abuse    Past Surgical History:  Procedure Laterality Date  . ABDOMINAL HYSTERECTOMY  1992   total endometriosis  . APPENDECTOMY  1992  . bladder tack    . FOOT SURGERY Right 04/2016   [place] in [place] History  Substance Use Topics  . Smoking status: Current Every Day Smoker    Packs/day: 0.30  . Smokeless tobacco: Never Used  . Alcohol use No   Family History  Problem Relation Age of Onset  . Heart disease Mother        MI  . Heart disease Father        MI  . Arthritis Sister        crippling arthritis  . Arthritis Brother        crippling arthritis   Allergies  Allergen Reactions  . Acetaminophen     REACTION: gi upset  Current Outpatient Prescriptions on File Prior to Visit  Medication Sig Dispense Refill  . aspirin 81 MG tablet Take 81 mg by mouth daily.      Marland Kitchen atorvastatin (LIPITOR) 10 MG tablet Take 1 tablet (10 mg total) by mouth daily. 90 tablet 3  . DULoxetine (CYMBALTA) 60 MG capsule Take 1 capsule (60 mg total) by mouth daily. 90 capsule 3  . LYRICA 75 MG capsule TAKE ONE CAPSULE BY MOUTH TWICE A DAY 180 capsule 1  . meloxicam (MOBIC) 15 MG tablet Take 15 mg by mouth daily.    Marland Kitchen oxyCODONE-acetaminophen (PERCOCET/ROXICET) 5-325 MG tablet Take 1 tablet by mouth every 4 (four) hours as needed for severe pain.    . traMADol (ULTRAM) 50 MG tablet TAKE ONE TABLET BY MOUTH EVERY 8 HOURS. 90 tablet 1  . vitamin C (ASCORBIC ACID) 500 MG tablet Take 500 mg by mouth daily.     No current facility-administered medications on file prior to  visit.     Review of Systems  Constitutional: Positive for fatigue. Negative for activity change, appetite change, fever and unexpected weight change.  HENT: Negative for congestion, ear pain, rhinorrhea, sinus pressure and sore throat.   Eyes: Negative for pain, redness and visual disturbance.  Respiratory: Negative for cough, shortness of breath and wheezing.   Cardiovascular: Negative for chest pain and palpitations.  Gastrointestinal: Negative for abdominal pain, blood in stool, constipation and diarrhea.  Endocrine: Negative for polydipsia and polyuria.  Genitourinary: Negative for dysuria, frequency and urgency.  Musculoskeletal: Positive for arthralgias, back pain, joint swelling and myalgias.  Skin: Negative for pallor and rash.  Allergic/Immunologic: Negative for environmental allergies.  Neurological: Negative for dizziness, syncope and headaches.  Hematological: Negative for adenopathy. Does not bruise/bleed easily.  Psychiatric/Behavioral: Negative for decreased concentration and dysphoric mood. The patient is not nervous/anxious.        Objective:   Physical Exam  Constitutional: She appears well-developed and well-nourished. No distress.  HENT:  Head: Normocephalic and atraumatic.  Eyes: Pupils are equal, round, and reactive to light. Conjunctivae and EOM are normal. No scleral icterus.  Neck: Normal range of motion. Neck supple.  Cardiovascular: Normal rate and regular rhythm.   Musculoskeletal: She exhibits tenderness and deformity. She exhibits no edema.  Hands- some deformity of OA in distal phalanges  Some tenderness of MCP No triggering today   Knee  Right: No swelling or effusion  No warmth to the touch  Mild crepitus  ROM:  Flex 100 deg  Ext full mcmurray pos for pain Bounce test neg  Stability: Anterior drawer nl Lachman exam nl  Tenderness mild over patellar tendon and also both joint lines   Gait slt labored /favors L     Lymphadenopathy:      She has no cervical adenopathy.  Skin: Skin is warm and dry. No rash noted. No erythema. No pallor.  Psychiatric: She has a normal mood and affect.          Assessment & Plan:   Problem List Items Addressed This Visit      Musculoskeletal and Integument   Generalized osteoarthritis of hand    Hand orthopedic specialists had nothing extra to offer  Pt also has fibromyalgia  Neg auto immune joint tests  Disc warming up after inactivity and staying active  Continue meloxicam  Also tramadol prn  Continue to follow         Other   Right knee pain - Primary    Rev  xray-no OA Suspect mechanical with some patellar tendon and lateral pain /stable on exam  Adv to wear neoprene knee sleeve Ice for 10 min whenever able  Avoid high impact activities Continue meloxicam  Ref to PT for eval and tx  Consider sport med eval if no imp       Relevant Orders   Ambulatory referral to Physical Therapy

## 2016-12-03 NOTE — Assessment & Plan Note (Signed)
Hand orthopedic specialists had nothing extra to offer  Pt also has fibromyalgia  Neg auto immune joint tests  Disc warming up after inactivity and staying active  Continue meloxicam  Also tramadol prn  Continue to follow

## 2016-12-03 NOTE — Assessment & Plan Note (Signed)
Rev xray-no OA Suspect mechanical with some patellar tendon and lateral pain /stable on exam  Adv to wear neoprene knee sleeve Ice for 10 min whenever able  Avoid high impact activities Continue meloxicam  Ref to PT for eval and tx  Consider sport med eval if no imp

## 2016-12-04 ENCOUNTER — Ambulatory Visit: Payer: PPO

## 2016-12-29 ENCOUNTER — Ambulatory Visit: Payer: PPO

## 2017-04-28 ENCOUNTER — Encounter: Payer: Self-pay | Admitting: Family Medicine

## 2017-04-28 ENCOUNTER — Ambulatory Visit (INDEPENDENT_AMBULATORY_CARE_PROVIDER_SITE_OTHER): Payer: PPO | Admitting: Family Medicine

## 2017-04-28 VITALS — BP 116/58 | HR 99 | Temp 98.1°F | Ht 64.5 in | Wt 143.5 lb

## 2017-04-28 DIAGNOSIS — J069 Acute upper respiratory infection, unspecified: Secondary | ICD-10-CM

## 2017-04-28 DIAGNOSIS — F17200 Nicotine dependence, unspecified, uncomplicated: Secondary | ICD-10-CM | POA: Diagnosis not present

## 2017-04-28 DIAGNOSIS — J0100 Acute maxillary sinusitis, unspecified: Secondary | ICD-10-CM | POA: Diagnosis not present

## 2017-04-28 DIAGNOSIS — J019 Acute sinusitis, unspecified: Secondary | ICD-10-CM | POA: Insufficient documentation

## 2017-04-28 MED ORDER — AMOXICILLIN-POT CLAVULANATE 875-125 MG PO TABS: 1.0000 | ORAL_TABLET | Freq: Two times a day (BID) | ORAL | 0 refills | Status: DC

## 2017-04-28 MED ORDER — PREDNISONE 10 MG PO TABS: ORAL_TABLET | ORAL | 0 refills | Status: DC

## 2017-04-28 MED ORDER — BENZONATATE 200 MG PO CAPS: 200.0000 mg | ORAL_CAPSULE | Freq: Three times a day (TID) | ORAL | 1 refills | Status: DC | PRN

## 2017-04-28 NOTE — Assessment & Plan Note (Signed)
Over a week in a smoker  Now developing sinus infection (see a/p) and poss bronchitis Prednisone taper px for reactive airways  Tessalon for cough  Update if not starting to improve in a week or if worsening   Disc symptomatic care - see instructions on AVS

## 2017-04-28 NOTE — Assessment & Plan Note (Signed)
S/p 8 d of uri in smoker with sinus pain  Cover with augmentin  (prednisone and tessalon for cough and reactive airways)  Supportive care  Update if not starting to improve in a week or if worsening

## 2017-04-28 NOTE — Assessment & Plan Note (Signed)
Pt has not smoked in a few days due to illness Stressed that this is the best time to quit Disc in detail risks of smoking and possible outcomes including copd, vascular/ heart disease, cancer , respiratory and sinus infections  Pt voices understanding  She is not motivated to quit at this time

## 2017-04-28 NOTE — Patient Instructions (Signed)
Drink lots of fluids  Try to rest  Ibuprofen for fever or aches  Take the prednisone for wheezing and chest tightness as directed augmentin for sinus infection (and bronchitis)  Tessalon for cough as directed   This is the best time to quit smoking -since you are sick/so think about it

## 2017-04-28 NOTE — Progress Notes (Signed)
Subjective:    Patient ID: [name] [name], female    DOB: 1950/07/03, 67 y.o.   MRN: [id number]  HPI Here for flu like symptoms with cough and nasal congestion   Temp: 98.1 F (36.7 C)   Smoking status   Sick for 8 days  Headache- both sides of forehead  Very congestion - nasal d/c is clear / no blood  Cough - at times phlegm is yellow  Aching all over - (cold and hot at times - has not taken temp)  Very tired  Throat is very sore  Unsure if pnd   otc : took alka selzer plus cold tabs  mucinex dm  Dimetapp for cough   Some wheezing at times  Does not have an inhaler    Patient Active Problem List   Diagnosis Date Noted  . Right knee pain 09/16/2016  . Multiple joint pain 09/16/2016  . Insect bite 09/16/2016  . Osteopenia 06/15/2016  . Screening mammogram, encounter for 04/28/2016  . Estrogen deficiency 04/28/2016  . Medicare annual wellness visit, initial 01/02/2015  . Colon cancer screening 05/31/2013  . Hyperkalemia 11/30/2012  . Retinopathy, bilateral 10/05/2012  . Hyperlipidemia, mild 10/05/2012  . Adverse effects of medication 03/24/2012  . Routine general medical examination at a health care facility 01/22/2011  . Left elbow pain 01/22/2011  . Generalized osteoarthritis of hand 07/06/2009  . SLEEP DISORDER 01/18/2007  . POSTHERPETIC NEURALGIA 12/15/2006  . TOBACCO ABUSE 12/15/2006  . CARPAL TUNNEL SYNDROME 12/15/2006  . Osteoarthritis 12/15/2006  . FIBROMYALGIA 12/15/2006   Past Medical History:  Diagnosis Date  . Arthritis    OA  . Carpal tunnel syndrome   . Endometriosis   . Fibromyalgia   . Post herpetic neuralgia   . Sleep disorder   . Tobacco abuse    Past Surgical History:  Procedure Laterality Date  . ABDOMINAL HYSTERECTOMY  1992   total endometriosis  . APPENDECTOMY  1992  . bladder tack    . FOOT SURGERY Right 04/2016   [facility] in [place] History   Tobacco Use  . Smoking status: Current Every Day  Smoker    Packs/day: 0.30  . Smokeless tobacco: Never Used  Substance Use Topics  . Alcohol use: No    Alcohol/week: 0.0 oz  . Drug use: No   Family History  Problem Relation Age of Onset  . Heart disease Mother        MI  . Heart disease Father        MI  . Arthritis Sister        crippling arthritis  . Arthritis Brother        crippling arthritis   Allergies  Allergen Reactions  . Acetaminophen     REACTION: gi upset   Current Outpatient Medications on File Prior to Visit  Medication Sig Dispense Refill  . aspirin 81 MG tablet Take 81 mg by mouth daily.      Marland Kitchen atorvastatin (LIPITOR) 10 MG tablet Take 1 tablet (10 mg total) by mouth daily. 90 tablet 3  . DULoxetine (CYMBALTA) 60 MG capsule Take 1 capsule (60 mg total) by mouth daily. 90 capsule 3  . LYRICA 75 MG capsule TAKE ONE CAPSULE BY MOUTH TWICE A DAY 180 capsule 1  . meloxicam (MOBIC) 15 MG tablet Take 15 mg by mouth daily.    Marland Kitchen oxyCODONE-acetaminophen (PERCOCET/ROXICET) 5-325 MG tablet Take 1 tablet by mouth every 4 (four) hours as needed for  severe pain.    . traMADol (ULTRAM) 50 MG tablet TAKE ONE TABLET BY MOUTH EVERY 8 HOURS. 90 tablet 1  . vitamin C (ASCORBIC ACID) 500 MG tablet Take 500 mg by mouth daily.     No current facility-administered medications on file prior to visit.     Review of Systems  Constitutional: Positive for appetite change. Negative for fatigue and fever.       Pos for occ body aches/? If fever  HENT: Positive for congestion, ear pain, postnasal drip, rhinorrhea, sinus pressure and sore throat. Negative for nosebleeds.   Eyes: Negative for pain, redness and itching.  Respiratory: Positive for cough and wheezing. Negative for shortness of breath.   Cardiovascular: Negative for chest pain.  Gastrointestinal: Negative for abdominal pain, diarrhea, nausea and vomiting.  Endocrine: Negative for polyuria.  Genitourinary: Negative for dysuria, frequency and urgency.  Musculoskeletal:  Negative for arthralgias and myalgias.  Allergic/Immunologic: Negative for immunocompromised state.  Neurological: Positive for headaches. Negative for dizziness, tremors, syncope, weakness and numbness.  Hematological: Negative for adenopathy. Does not bruise/bleed easily.  Psychiatric/Behavioral: Negative for dysphoric mood. The patient is not nervous/anxious.        Objective:   Physical Exam  Constitutional: She appears well-developed and well-nourished. No distress.  Well but fatigued appearing   HENT:  Head: Normocephalic and atraumatic.  Right Ear: External ear normal.  Left Ear: External ear normal.  Mouth/Throat: Oropharynx is clear and moist. No oropharyngeal exudate.  Nares are injected and congested  Bilateral maxillary sinus tenderness  Post nasal drip   Eyes: Conjunctivae and EOM are normal. Pupils are equal, round, and reactive to light. Right eye exhibits no discharge. Left eye exhibits no discharge. No scleral icterus.  Neck: Normal range of motion. Neck supple.  Cardiovascular: Normal rate and regular rhythm.  Pulmonary/Chest: Effort normal and breath sounds normal. No respiratory distress. She has no wheezes. She has no rales.  Diffusely distant bs  Scattered wheezes noted  No prolonged exp phase occ rhonchi No rales    Lymphadenopathy:    She has no cervical adenopathy.  Neurological: She is alert. No cranial nerve deficit.  Skin: Skin is warm and dry. No rash noted. No pallor.  Psychiatric: She has a normal mood and affect.          Assessment & Plan:   Problem List Items Addressed This Visit      Respiratory   Acute sinusitis - Primary    S/p 8 d of uri in smoker with sinus pain  Cover with augmentin  (prednisone and tessalon for cough and reactive airways)  Supportive care  Update if not starting to improve in a week or if worsening         Relevant Medications   amoxicillin-clavulanate (AUGMENTIN) 875-125 MG tablet   predniSONE  (DELTASONE) 10 MG tablet   benzonatate (TESSALON) 200 MG capsule   URI with cough and congestion    Over a week in a smoker  Now developing sinus infection (see a/p) and poss bronchitis Prednisone taper px for reactive airways  Tessalon for cough  Update if not starting to improve in a week or if worsening   Disc symptomatic care - see instructions on AVS         Other   TOBACCO ABUSE    Pt has not smoked in a few days due to illness Stressed that this is the best time to quit Disc in detail risks of smoking and possible outcomes  including copd, vascular/ heart disease, cancer , respiratory and sinus infections  Pt voices understanding  She is not motivated to quit at this time

## 2017-06-05 ENCOUNTER — Other Ambulatory Visit: Payer: Self-pay | Admitting: *Deleted

## 2017-06-05 MED ORDER — DULOXETINE HCL 60 MG PO CPEP: 60.0000 mg | ORAL_CAPSULE | Freq: Every day | ORAL | 1 refills | Status: DC

## 2017-06-05 MED ORDER — TRAMADOL HCL 50 MG PO TABS
50.0000 mg | ORAL_TABLET | Freq: Three times a day (TID) | ORAL | 0 refills | Status: DC
Start: 2017-06-05 — End: 2017-08-28

## 2017-06-05 MED ORDER — ATORVASTATIN CALCIUM 10 MG PO TABS: 10.0000 mg | ORAL_TABLET | Freq: Every day | ORAL | 1 refills | Status: DC

## 2017-06-05 MED ORDER — PREGABALIN 75 MG PO CAPS: 75.0000 mg | ORAL_CAPSULE | Freq: Two times a day (BID) | ORAL | 0 refills | Status: DC

## 2017-06-05 NOTE — Telephone Encounter (Signed)
Please schedule a summer f/u

## 2017-06-05 NOTE — Telephone Encounter (Signed)
Pt left note at front desk requesting refill of meds. AWV was 09/12/16 and pt has a few acute appts since then. Please advise, controlled sub info printed from online database and placed in your inbox for review

## 2017-06-05 NOTE — Telephone Encounter (Signed)
[name] [name] will call pt to get appt scheduled

## 2017-08-28 ENCOUNTER — Other Ambulatory Visit: Payer: Self-pay | Admitting: *Deleted

## 2017-08-28 NOTE — Telephone Encounter (Signed)
Name of Medication: Tramadol  Name of Pharmacy: [facility] or Written Date and Quantity: 06/05/17 #90 with 0 refills  Last Office Visit and Type: 04/28/17 (Acute:?URI) Next Office Visit and Type: 10/06/17 follow-up Last Controlled Substance Agreement Date: 01/02/15 (? If we stopped for Tramadol) Last UDS: 01/02/15

## 2017-08-30 MED ORDER — TRAMADOL HCL 50 MG PO TABS: 50.0000 mg | ORAL_TABLET | Freq: Three times a day (TID) | ORAL | 0 refills | Status: DC | PRN

## 2017-10-06 ENCOUNTER — Ambulatory Visit: Payer: PPO | Admitting: Family Medicine

## 2017-11-24 ENCOUNTER — Ambulatory Visit (INDEPENDENT_AMBULATORY_CARE_PROVIDER_SITE_OTHER): Payer: PPO

## 2017-11-24 ENCOUNTER — Encounter: Payer: Self-pay | Admitting: Family Medicine

## 2017-11-24 ENCOUNTER — Ambulatory Visit (INDEPENDENT_AMBULATORY_CARE_PROVIDER_SITE_OTHER): Payer: PPO | Admitting: Family Medicine

## 2017-11-24 VITALS — BP 104/62 | HR 68 | Temp 98.4°F | Ht 64.75 in | Wt 131.5 lb

## 2017-11-24 DIAGNOSIS — Z23 Encounter for immunization: Secondary | ICD-10-CM

## 2017-11-24 DIAGNOSIS — Z1211 Encounter for screening for malignant neoplasm of colon: Secondary | ICD-10-CM | POA: Diagnosis not present

## 2017-11-24 DIAGNOSIS — F4321 Adjustment disorder with depressed mood: Secondary | ICD-10-CM | POA: Diagnosis not present

## 2017-11-24 DIAGNOSIS — M797 Fibromyalgia: Secondary | ICD-10-CM | POA: Diagnosis not present

## 2017-11-24 DIAGNOSIS — Z0000 Encounter for general adult medical examination without abnormal findings: Secondary | ICD-10-CM

## 2017-11-24 DIAGNOSIS — E785 Hyperlipidemia, unspecified: Secondary | ICD-10-CM | POA: Diagnosis not present

## 2017-11-24 DIAGNOSIS — Z1159 Encounter for screening for other viral diseases: Secondary | ICD-10-CM

## 2017-11-24 DIAGNOSIS — Z1231 Encounter for screening mammogram for malignant neoplasm of breast: Secondary | ICD-10-CM | POA: Diagnosis not present

## 2017-11-24 DIAGNOSIS — M8589 Other specified disorders of bone density and structure, multiple sites: Secondary | ICD-10-CM | POA: Diagnosis not present

## 2017-11-24 DIAGNOSIS — F17200 Nicotine dependence, unspecified, uncomplicated: Secondary | ICD-10-CM

## 2017-11-24 DIAGNOSIS — F4320 Adjustment disorder, unspecified: Secondary | ICD-10-CM | POA: Insufficient documentation

## 2017-11-24 MED ORDER — ATORVASTATIN CALCIUM 10 MG PO TABS: 10.0000 mg | ORAL_TABLET | Freq: Every day | ORAL | 3 refills | Status: DC

## 2017-11-24 MED ORDER — TRAMADOL HCL 50 MG PO TABS: 50.0000 mg | ORAL_TABLET | Freq: Three times a day (TID) | ORAL | 0 refills | Status: DC | PRN

## 2017-11-24 MED ORDER — DULOXETINE HCL 60 MG PO CPEP: 60.0000 mg | ORAL_CAPSULE | Freq: Every day | ORAL | 3 refills | Status: DC

## 2017-11-24 MED ORDER — PREGABALIN 75 MG PO CAPS: 75.0000 mg | ORAL_CAPSULE | Freq: Two times a day (BID) | ORAL | 1 refills | Status: DC

## 2017-11-24 MED ORDER — BUSPIRONE HCL 15 MG PO TABS: 7.5000 mg | ORAL_TABLET | Freq: Two times a day (BID) | ORAL | 11 refills | Status: DC

## 2017-11-24 NOTE — Progress Notes (Signed)
Subjective:   [name] [name] is a 67 y.o. female who presents for Medicare Annual (Subsequent) preventive examination.  Review of Systems:  N/A Cardiac Risk Factors include: advanced age (>79men, >49 women)     Objective:     Vitals: BP 104/62 (BP Location: Left Arm, Patient Position: Sitting, Cuff Size: Normal)   Pulse 68   Temp 98.4 F (36.9 C) (Oral)   Ht 5' 4.75" (1.645 m)   Wt 131 lb 8 oz (59.6 kg)   SpO2 96%   BMI 22.05 kg/m   Body mass index is 22.05 kg/m.  Advanced Directives 11/24/2017 09/12/2016  Does Patient Have a Medical Advance Directive? Yes Yes  Type of Paramedic of [place];Living will [place];Living will  Copy of [place] in Chart? No - copy requested No - copy requested    Tobacco Social History   Tobacco Use  Smoking Status Current Every Day Smoker  . Packs/day: 0.30  Smokeless Tobacco Never Used     Ready to quit: No Counseling given: No   Clinical Intake:  Pre-visit preparation completed: Yes  Pain : No/denies pain Pain Score: 0-No pain     Nutritional Status: BMI of 19-24  Normal Nutritional Risks: None Diabetes: No  How often do you need to have someone help you when you read instructions, pamphlets, or other written materials from your doctor or pharmacy?: 1 - Never What is the last grade level you completed in school?: 11th grade  Interpreter Needed?: No  Comments: pt is a widow and lives alone Information entered by :: LPinson, LPN  Past Medical History:  Diagnosis Date  . Arthritis    OA  . Carpal tunnel syndrome   . Endometriosis   . Fibromyalgia   . Post herpetic neuralgia   . Sleep disorder   . Tobacco abuse    Past Surgical History:  Procedure Laterality Date  . ABDOMINAL HYSTERECTOMY  1992   total endometriosis  . APPENDECTOMY  1992  . bladder tack    . FOOT SURGERY Right 04/2016   [facility] in [place]   Family History    Problem Relation Age of Onset  . Heart disease Mother        MI  . Heart disease Father        MI  . Arthritis Sister        crippling arthritis  . Arthritis Brother        crippling arthritis   Social History   Socioeconomic History  . Marital status: Widowed    Spouse name: Not on file  . Number of children: Not on file  . Years of education: Not on file  . Highest education level: Not on file  Occupational History  . Not on file  Social Needs  . Financial resource strain: Not on file  . Food insecurity:    Worry: Not on file    Inability: Not on file  . Transportation needs:    Medical: Not on file    Non-medical: Not on file  Tobacco Use  . Smoking status: Current Every Day Smoker    Packs/day: 0.30  . Smokeless tobacco: Never Used  Substance and Sexual Activity  . Alcohol use: No    Alcohol/week: 0.0 standard drinks  . Drug use: No  . Sexual activity: Not Currently  Lifestyle  . Physical activity:    Days per week: Not on file    Minutes per  session: Not on file  . Stress: Not on file  Relationships  . Social connections:    Talks on phone: Not on file    Gets together: Not on file    Attends religious service: Not on file    Active member of club or organization: Not on file    Attends meetings of clubs or organizations: Not on file    Relationship status: Not on file  Other Topics Concern  . Not on file  Social History Narrative  . Not on file    Outpatient Encounter Medications as of 11/24/2017  Medication Sig  . aspirin 81 MG tablet Take 81 mg by mouth daily.    Marland Kitchen atorvastatin (LIPITOR) 10 MG tablet Take 1 tablet (10 mg total) by mouth daily.  . busPIRone (BUSPAR) 15 MG tablet Take 0.5 tablets (7.5 mg total) by mouth 2 (two) times daily.  . DULoxetine (CYMBALTA) 60 MG capsule Take 1 capsule (60 mg total) by mouth daily.  . meloxicam (MOBIC) 15 MG tablet Take 15 mg by mouth daily.  Marland Kitchen oxyCODONE-acetaminophen (PERCOCET/ROXICET) 5-325 MG tablet  Take 1 tablet by mouth every 4 (four) hours as needed for severe pain.  . pregabalin (LYRICA) 75 MG capsule Take 1 capsule (75 mg total) by mouth 2 (two) times daily.  . traMADol (ULTRAM) 50 MG tablet Take 1 tablet (50 mg total) by mouth every 8 (eight) hours as needed.  . vitamin C (ASCORBIC ACID) 500 MG tablet Take 500 mg by mouth daily.  . [DISCONTINUED] atorvastatin (LIPITOR) 10 MG tablet Take 1 tablet (10 mg total) by mouth daily.  . [DISCONTINUED] DULoxetine (CYMBALTA) 60 MG capsule Take 1 capsule (60 mg total) by mouth daily.  . [DISCONTINUED] pregabalin (LYRICA) 75 MG capsule Take 1 capsule (75 mg total) by mouth 2 (two) times daily.  . [DISCONTINUED] traMADol (ULTRAM) 50 MG tablet Take 1 tablet (50 mg total) by mouth every 8 (eight) hours as needed.   No facility-administered encounter medications on file as of 11/24/2017.     Activities of Daily Living In your present state of health, do you have any difficulty performing the following activities: 11/24/2017  Hearing? N  Vision? Y  Difficulty concentrating or making decisions? N  Walking or climbing stairs? N  Dressing or bathing? N  Doing errands, shopping? N  Preparing Food and eating ? N  Using the Toilet? N  In the past six months, have you accidently leaked urine? N  Do you have problems with loss of bowel control? N  Managing your Medications? N  Managing your Finances? N  Housekeeping or managing your Housekeeping? N  Some recent data might be hidden    Patient Care Team: Tower, [name], MD as PCP - General    Assessment:   This is a routine wellness examination for [name].   Hearing Screening   125Hz  250Hz  500Hz  1000Hz  2000Hz  3000Hz  4000Hz  6000Hz  8000Hz   Right ear:   0 40 40  40    Left ear:   40 40 40  40      Visual Acuity Screening   Right eye Left eye Both eyes  Without correction: 20/50 20/40-1 20/40  With correction:       Exercise Activities and Dietary recommendations Current Exercise Habits:  The patient does not participate in regular exercise at present, Exercise limited by: None identified  Goals    . Patient Stated     Starting 11/24/2017, I will continue to take medications as prescribed.  Fall Risk Fall Risk  11/24/2017 09/12/2016 01/02/2015 05/31/2013  Falls in the past year? No No No No   Depression Screen PHQ 2/9 Scores 11/24/2017 09/12/2016 01/02/2015 05/31/2013  PHQ - 2 Score 2 0 0 0  PHQ- 9 Score 8 - - -     Cognitive Function MMSE - Mini Mental State Exam 11/24/2017 09/12/2016  Orientation to time 5 5  Orientation to Place 5 5  Registration 3 3  Attention/ Calculation 0 0  Recall 3 3  Language- name 2 objects 0 0  Language- repeat 1 1  Language- follow 3 step command 3 3  Language- read & follow direction 0 0  Write a sentence 0 0  Copy design 0 0  Total score 20 20       PLEASE NOTE: A Mini-Cog screen was completed. Maximum score is 20. A value of 0 denotes this part of [place] MMSE was not completed or the patient failed this part of the Mini-Cog screening.   Mini-Cog Screening Orientation to Time - Max 5 pts Orientation to Place - Max 5 pts Registration - Max 3 pts Recall - Max 3 pts Language Repeat - Max 1 pts Language Follow 3 Step Command - Max 3 pts   Immunization History  Administered Date(s) Administered  . Influenza Split 01/22/2011, 03/24/2012  . Influenza, High Dose Seasonal PF 11/07/2016  . Influenza,inj,Quad PF,6+ Mos 11/30/2012, 12/06/2014, 11/22/2015, 11/24/2017  . Pneumococcal Conjugate-13 01/02/2015  . Pneumococcal Polysaccharide-23 01/22/2011, 09/12/2016  . Td 05/01/1998, 03/04/2007    Screening Tests Health Maintenance  Topic Date Due  . TETANUS/TDAP  11/02/2018 (Originally 03/03/2017)  . MAMMOGRAM  06/13/2018  . Fecal DNA (Cologuard)  12/02/2019  . INFLUENZA VACCINE  Completed  . DEXA SCAN  Completed  . Hepatitis C Screening  Completed  . PNA vac Low Risk Adult  Completed        Plan:     I have personally  reviewed, addressed, and noted the following in the patient's chart:  A. Medical and social history B. Use of alcohol, tobacco or illicit drugs  C. Current medications and supplements D. Functional ability and status E.  Nutritional status F.  Physical activity G. Advance directives H. List of other physicians I.  Hospitalizations, surgeries, and ER visits in previous 12 months J.  [place] to include hearing, vision, cognitive, depression L. Referrals and appointments - none  In addition, I have reviewed and discussed with patient certain preventive protocols, quality metrics, and best practice recommendations. A written personalized care plan for preventive services as well as general preventive health recommendations were provided to patient.  See attached scanned questionnaire for additional information.   Signed,   Lindell Noe, MHA, BS, LPN Health Coach

## 2017-11-24 NOTE — Progress Notes (Signed)
Subjective:    Patient ID: [name] [name], female    DOB: Jun 14, 1950, 67 y.o.   MRN: [id number]  HPI Here for health maintenance exam and to review chronic medical problems   Husband passed away in Oct 05, 2022  He was sick -prostate cancer  Is tearful  Goes oct 8th for counseling  Not sleeping well  Feels anxious  Has daughters - also grieving (good support)   She takes melatonin -not helpful  Has tried benadryl and unisom- don't help   On cymbalta already   Has amw planned after this appt  Flu shot given today   Wt Readings from Last 3 Encounters:  11/24/17 131 lb 8 oz (59.6 kg)  04/28/17 143 lb 8 oz (65.1 kg)  12/02/16 141 lb 8 oz (64.2 kg)  not eating well with grief  Trying to eat regularly  22.05 kg/m   Colon cancer screening - she did the cologuard -and sample could not be processed Declines repeating that for now  No symptoms and no family history  Declines colonoscopy   Mammogram 4/18 - due for that /will set up  Self breast exam - no lumps   dexa 4/18 osteopenia  No falls or fx this year  Taking vitamin D   Zoster status - no vaccine /interested in shingrix  Had shingles once   Smoking status - about the same 1/2 ppd /has not inc with grief Not ready to quit No lung problems   BP BP Readings from Last 3 Encounters:  11/24/17 104/62  04/28/17 (!) 116/58  12/02/16 136/80   Pulse Readings from Last 3 Encounters:  11/24/17 68  04/28/17 99  12/02/16 68    Hyperlipidemia  Lab Results  Component Value Date   CHOL 224 (H) 04/28/2016   HDL 89.80 04/28/2016   LDLCALC 123 (H) 04/28/2016   LDLDIRECT 127.3 01/22/2011   TRIG 55.0 04/28/2016   CHOLHDL 2 04/28/2016   Due for labs   Patient Active Problem List   Diagnosis Date Noted  . Grief reaction 11/24/2017  . Right knee pain 09/16/2016  . Multiple joint pain 09/16/2016  . Insect bite 09/16/2016  . Osteopenia 06/15/2016  . Screening mammogram, encounter for 04/28/2016  . Estrogen deficiency  04/28/2016  . Medicare annual wellness visit, initial 01/02/2015  . Colon cancer screening 05/31/2013  . Hyperkalemia 11/30/2012  . Retinopathy, bilateral 10/05/2012  . Hyperlipidemia, mild 10/05/2012  . Adverse effects of medication 03/24/2012  . Routine general medical examination at a health care facility 01/22/2011  . Left elbow pain 01/22/2011  . Generalized osteoarthritis of hand 07/06/2009  . SLEEP DISORDER 01/18/2007  . POSTHERPETIC NEURALGIA 12/15/2006  . TOBACCO ABUSE 12/15/2006  . CARPAL TUNNEL SYNDROME 12/15/2006  . Osteoarthritis 12/15/2006  . Fibromyalgia syndrome 12/15/2006   Past Medical History:  Diagnosis Date  . Arthritis    OA  . Carpal tunnel syndrome   . Endometriosis   . Fibromyalgia   . Post herpetic neuralgia   . Sleep disorder   . Tobacco abuse    Past Surgical History:  Procedure Laterality Date  . ABDOMINAL HYSTERECTOMY  1992   total endometriosis  . APPENDECTOMY  1992  . bladder tack    . FOOT SURGERY Right 04/2016   [facility] in [place] History   Tobacco Use  . Smoking status: Current Every Day Smoker    Packs/day: 0.30  . Smokeless tobacco: Never Used  Substance Use Topics  . Alcohol use: No  Alcohol/week: 0.0 standard drinks  . Drug use: No   Family History  Problem Relation Age of Onset  . Heart disease Mother        MI  . Heart disease Father        MI  . Arthritis Sister        crippling arthritis  . Arthritis Brother        crippling arthritis   Allergies  Allergen Reactions  . Acetaminophen     REACTION: gi upset   Current Outpatient Medications on File Prior to Visit  Medication Sig Dispense Refill  . aspirin 81 MG tablet Take 81 mg by mouth daily.      . meloxicam (MOBIC) 15 MG tablet Take 15 mg by mouth daily.    Marland Kitchen oxyCODONE-acetaminophen (PERCOCET/ROXICET) 5-325 MG tablet Take 1 tablet by mouth every 4 (four) hours as needed for severe pain.    . vitamin C (ASCORBIC ACID) 500 MG tablet  Take 500 mg by mouth daily.     No current facility-administered medications on file prior to visit.       Review of Systems  Constitutional: Positive for appetite change. Negative for activity change, fatigue, fever and unexpected weight change.  HENT: Negative for congestion, ear pain, rhinorrhea, sinus pressure and sore throat.   Eyes: Negative for pain, redness and visual disturbance.  Respiratory: Negative for cough, shortness of breath and wheezing.   Cardiovascular: Negative for chest pain and palpitations.  Gastrointestinal: Negative for abdominal pain, blood in stool, constipation and diarrhea.  Endocrine: Negative for polydipsia and polyuria.  Genitourinary: Negative for dysuria, frequency and urgency.  Musculoskeletal: Negative for arthralgias, back pain and myalgias.  Skin: Negative for pallor and rash.  Allergic/Immunologic: Negative for environmental allergies.  Neurological: Negative for dizziness, syncope and headaches.  Hematological: Negative for adenopathy. Does not bruise/bleed easily.  Psychiatric/Behavioral: Positive for dysphoric mood and sleep disturbance. Negative for decreased concentration. The patient is not nervous/anxious.        Grief reaction from loss of her husband        Objective:   Physical Exam  Constitutional: She appears well-developed and well-nourished. No distress.  Well appearing   HENT:  Head: Normocephalic and atraumatic.  Right Ear: External ear normal.  Left Ear: External ear normal.  Mouth/Throat: Oropharynx is clear and moist.  Eyes: Pupils are equal, round, and reactive to light. Conjunctivae and EOM are normal. No scleral icterus.  Neck: Normal range of motion. Neck supple. No JVD present. Carotid bruit is not present. No thyromegaly present.  Cardiovascular: Normal rate, regular rhythm, normal heart sounds and intact distal pulses. Exam reveals no gallop.  Pulmonary/Chest: Effort normal and breath sounds normal. No respiratory  distress. She has no wheezes. She exhibits no tenderness. No breast tenderness, discharge or bleeding.  Diffusely distant bs   Abdominal: Soft. Bowel sounds are normal. She exhibits no distension, no abdominal bruit and no mass. There is no tenderness.  Genitourinary: No breast tenderness, discharge or bleeding.  Genitourinary Comments: Breast exam: No mass, nodules, thickening, tenderness, bulging, retraction, inflamation, nipple discharge or skin changes noted.  No axillary or clavicular LA.      Musculoskeletal: Normal range of motion. She exhibits no edema or tenderness.  Lymphadenopathy:    She has no cervical adenopathy.  Neurological: She is alert. She has normal reflexes. She displays normal reflexes. No cranial nerve deficit. She exhibits normal muscle tone. Coordination normal.  Skin: Skin is warm and dry. No rash noted.  No erythema. No pallor.  Solar lentigines diffusely    Psychiatric: Her mood appears anxious. She exhibits a depressed mood.  Tearful with grief today           Assessment & Plan:   Problem List Items Addressed This Visit      Musculoskeletal and Integument   Osteopenia    dexa 4/18 osteopenia  Taking vit D No falls or fx   Vitamin D level is therapeutic with current supplementation Disc importance of this to bone and overall health         Other   Colon cancer screening    cologuard sample could not be processed last year She does not want to do it again  Declines colonoscopy as well or other screening at this time      Fibromyalgia syndrome   Grief reaction    After loosing husband this year (in July)  Starting counseling early next month  Reviewed stressors/ coping techniques/symptoms/ support sources/ tx options and side effects in detail today Good support  Using melatonin for sleep Taking cymbalta buspar 7.5 mg added for anxiety  Discussed expectations of this medication including time to effectiveness and mechanism of action, also  poss of side effects (early and late)- including mental fuzziness, weight or appetite change, nausea and poss of worse dep or anxiety (even suicidal thoughts)  Pt voiced understanding and will stop med and update if this occurs          Hyperlipidemia, mild    Disc goals for lipids and reasons to control them Rev last labs with pt Rev low sat fat diet in detail       Relevant Medications   atorvastatin (LIPITOR) 10 MG tablet   Routine general medical examination at a health care facility - Primary    Reviewed health habits including diet and exercise and skin cancer prevention Reviewed appropriate screening tests for age  Also reviewed health mt list, fam hx and immunization status , as well as social and family history   See HPI For amw visit today  Disc grief/ plan for tx and px buspar today  Enc self care  Rev last labs  Enc smoking cessation -she is not ready  Mammogram ref done  Pt declines any colon cancer screening       Screening mammogram, encounter for    Scheduled annual screening mammogram Nl breast exam today  Encouraged monthly self exams        Relevant Orders   MM 3D SCREEN BREAST BILATERAL   TOBACCO ABUSE    Disc in detail risks of smoking and possible outcomes including copd, vascular/ heart disease, cancer , respiratory and sinus infections  Pt voices understanding  Pt states she is not ready to consider at this time        Other Visit Diagnoses    Need for influenza vaccination       Relevant Orders   Flu Vaccine QUAD 6+ mos PF IM (Fluarix Quad PF) (Completed)

## 2017-11-24 NOTE — Patient Instructions (Addendum)
There are some apps for your smart phone for meditation - (Calm, Breethe, Smiling mind, Headspace)  Check those out for help with sleep   Try the buspar 1/2 pill twice daily for anxiety   Please make an effort to eat regular meals with protein  Do a shake or ensure at night if needed    We will schedule mammogram on your way out   If you are interested in the new shingles vaccine (Shingrix) - call your local pharmacy to check on coverage and availability  If affordable - get on a waiting list

## 2017-11-24 NOTE — Patient Instructions (Addendum)
Ms. Brumm , Thank you for taking time to come for your Medicare Wellness Visit. I appreciate your ongoing commitment to your health goals. Please review the following plan we discussed and let me know if I can assist you in the future.   These are the goals we discussed: Goals    . Patient Stated     Starting 11/24/2017, I will continue to take medications as prescribed.        This is a list of the screening recommended for you and due dates:  Health Maintenance  Topic Date Due  . Tetanus Vaccine  11/02/2018*  . Mammogram  06/13/2018  . Cologuard (Stool DNA test)  12/02/2019  . Flu Shot  Completed  . DEXA scan (bone density measurement)  Completed  .  Hepatitis C: One time screening is recommended by Center for Disease Control  (CDC) for  adults born from [birth year] through 1966.   Completed  . Pneumonia vaccines  Completed  *Topic was postponed. The date shown is not the original due date.   Preventive Care for Adults  A healthy lifestyle and preventive care can promote health and wellness. Preventive health guidelines for adults include the following key practices.  . A routine yearly physical is a good way to check with your health care provider about your health and preventive screening. It is a chance to share any concerns and updates on your health and to receive a thorough exam.  . Visit your dentist for a routine exam and preventive care every 6 months. Brush your teeth twice a day and floss once a day. Good oral hygiene prevents tooth decay and gum disease.  . The frequency of eye exams is based on your age, health, family medical history, use  of contact lenses, and other factors. Follow your health care provider's recommendations for frequency of eye exams.  . Eat a healthy diet. Foods like vegetables, fruits, whole grains, low-fat dairy products, and lean protein foods contain the nutrients you need without too many calories. Decrease your intake of foods high in solid  fats, added sugars, and salt. Eat the right amount of calories for you. Get information about a proper diet from your health care provider, if necessary.  . Regular physical exercise is one of the most important things you can do for your health. Most adults should get at least 150 minutes of moderate-intensity exercise (any activity that increases your heart rate and causes you to sweat) each week. In addition, most adults need muscle-strengthening exercises on 2 or more days a week.  Silver Sneakers may be a benefit available to you. To determine eligibility, you may visit the website: www.silversneakers.com or contact program at (475) 653-2273 Mon-Fri between 8AM-8PM.   . Maintain a healthy weight. The body mass index (BMI) is a screening tool to identify possible weight problems. It provides an estimate of body fat based on height and weight. Your health care provider can find your BMI and can help you achieve or maintain a healthy weight.   For adults 20 years and older: ? A BMI below 18.5 is considered underweight. ? A BMI of 18.5 to 24.9 is normal. ? A BMI of 25 to 29.9 is considered overweight. ? A BMI of 30 and above is considered obese.   . Maintain normal blood lipids and cholesterol levels by exercising and minimizing your intake of saturated fat. Eat a balanced diet with plenty of fruit and vegetables. Blood tests for lipids and cholesterol should begin  at age 21 and be repeated every 5 years. If your lipid or cholesterol levels are high, you are over 50, or you are at high risk for heart disease, you may need your cholesterol levels checked more frequently. Ongoing high lipid and cholesterol levels should be treated with medicines if diet and exercise are not working.  . If you smoke, find out from your health care provider how to quit. If you do not use tobacco, please do not start.  . If you choose to drink alcohol, please do not consume more than 2 drinks per day. One drink is  considered to be 12 ounces (355 mL) of beer, 5 ounces (148 mL) of wine, or 1.5 ounces (44 mL) of liquor.  . If you are 78-55 years old, ask your health care provider if you should take aspirin to prevent strokes.  . Use sunscreen. Apply sunscreen liberally and repeatedly throughout the day. You should seek shade when your shadow is shorter than you. Protect yourself by wearing long sleeves, pants, a wide-brimmed hat, and sunglasses year round, whenever you are outdoors.  . Once a month, do a whole body skin exam, using a mirror to look at the skin on your back. Tell your health care provider of new moles, moles that have irregular borders, moles that are larger than a pencil eraser, or moles that have changed in shape or color.

## 2017-11-24 NOTE — Progress Notes (Signed)
PCP notes:   Health maintenance:  Flu vaccine - administered Hep C screening - completed  Abnormal screenings:   Hearing - failed  Hearing Screening   125Hz  250Hz  500Hz  1000Hz  2000Hz  3000Hz  4000Hz  6000Hz  8000Hz   Right ear:   0 40 40  40    Left ear:   40 40 40  40      Depression score: 8 Depression screen [facility] 2/9 11/24/2017 09/12/2016 01/02/2015 05/31/2013  Decreased Interest 2 0 0 0  Down, Depressed, Hopeless 0 0 0 0  PHQ - 2 Score 2 0 0 0  Altered sleeping 1 - - -  Tired, decreased energy 2 - - -  Change in appetite 0 - - -  Feeling bad or failure about yourself  3 - - -  Trouble concentrating 0 - - -  Moving slowly or fidgety/restless 0 - - -  Suicidal thoughts 0 - - -  PHQ-9 Score 8 - - -  Difficult doing work/chores Not difficult at all - - -   Patient concerns:   Patient is still grieving loss of spouse. PCP is aware.  Nurse concerns:  None  Next PCP appt:   N/A; CPE prior to appt  I reviewed health advisor's note, was available for consultation, and agree with documentation and plan. Loura Pardon MD

## 2017-11-25 LAB — HEPATITIS C ANTIBODY
Hepatitis C Ab: NONREACTIVE
SIGNAL TO CUT-OFF: 0.02

## 2017-11-26 NOTE — Assessment & Plan Note (Signed)
Reviewed health habits including diet and exercise and skin cancer prevention Reviewed appropriate screening tests for age  Also reviewed health mt list, fam hx and immunization status , as well as social and family history   See HPI For amw visit today  Disc grief/ plan for tx and px buspar today  Enc self care  Rev last labs  Enc smoking cessation -she is not ready  Mammogram ref done  Pt declines any colon cancer screening

## 2017-11-26 NOTE — Assessment & Plan Note (Signed)
Disc goals for lipids and reasons to control them Rev last labs with pt Rev low sat fat diet in detail  

## 2017-11-26 NOTE — Assessment & Plan Note (Signed)
dexa 4/18 osteopenia  Taking vit D No falls or fx   Vitamin D level is therapeutic with current supplementation Disc importance of this to bone and overall health

## 2017-11-26 NOTE — Assessment & Plan Note (Signed)
After loosing husband this year (in July)  Starting counseling early next month  Reviewed stressors/ coping techniques/symptoms/ support sources/ tx options and side effects in detail today Good support  Using melatonin for sleep Taking cymbalta buspar 7.5 mg added for anxiety  Discussed expectations of this medication including time to effectiveness and mechanism of action, also poss of side effects (early and late)- including mental fuzziness, weight or appetite change, nausea and poss of worse dep or anxiety (even suicidal thoughts)  Pt voiced understanding and will stop med and update if this occurs

## 2017-11-26 NOTE — Assessment & Plan Note (Signed)
Disc in detail risks of smoking and possible outcomes including copd, vascular/ heart disease, cancer , respiratory and sinus infections  Pt voices understanding  Pt states she is not ready to consider at this time

## 2017-11-26 NOTE — Assessment & Plan Note (Signed)
Scheduled annual screening mammogram Nl breast exam today  Encouraged monthly self exams   

## 2017-11-26 NOTE — Assessment & Plan Note (Signed)
cologuard sample could not be processed last year She does not want to do it again  Declines colonoscopy as well or other screening at this time

## 2017-12-02 ENCOUNTER — Ambulatory Visit
Admission: RE | Admit: 2017-12-02 | Discharge: 2017-12-02 | Disposition: A | Discharge status: Home / Self Care | Payer: PPO | Source: Ambulatory Visit | Attending: Family Medicine | Admitting: Family Medicine

## 2017-12-02 DIAGNOSIS — Z1231 Encounter for screening mammogram for malignant neoplasm of breast: Secondary | ICD-10-CM | POA: Diagnosis not present

## 2017-12-25 DIAGNOSIS — H2513 Age-related nuclear cataract, bilateral: Secondary | ICD-10-CM | POA: Diagnosis not present

## 2018-03-04 ENCOUNTER — Other Ambulatory Visit: Payer: Self-pay | Admitting: *Deleted

## 2018-03-04 MED ORDER — TRAMADOL HCL 50 MG PO TABS: 50.0000 mg | ORAL_TABLET | Freq: Three times a day (TID) | ORAL | 0 refills | Status: DC | PRN

## 2018-03-04 NOTE — Telephone Encounter (Signed)
Name of Medication: Tramadol Name of Pharmacy: [facility] or Written Date and Quantity: 11/24/17 #90 tabs with 0 refills Last Office Visit and Type: CPE/AWV on 11/24/17 Next Office Visit and Type: CPE/AWV on 11/30/2018 Last Controlled Substance Agreement Date: 01/02/15 Last UDS:01/02/15

## 2018-03-09 DIAGNOSIS — H25011 Cortical age-related cataract, right eye: Secondary | ICD-10-CM | POA: Diagnosis not present

## 2018-03-16 ENCOUNTER — Encounter: Payer: Self-pay | Admitting: *Deleted

## 2018-03-25 ENCOUNTER — Ambulatory Visit: Payer: PPO | Admitting: Anesthesiology

## 2018-03-25 ENCOUNTER — Encounter
Admission: RE | Disposition: A | Discharge status: Home / Self Care | Payer: Self-pay | Source: Ambulatory Visit | Attending: Ophthalmology

## 2018-03-25 ENCOUNTER — Encounter: Payer: Self-pay | Admitting: Anesthesiology

## 2018-03-25 ENCOUNTER — Ambulatory Visit
Admission: RE | Admit: 2018-03-25 | Discharge: 2018-03-25 | Disposition: A | Discharge status: Home / Self Care | Payer: PPO | Source: Ambulatory Visit | Attending: Ophthalmology | Admitting: Ophthalmology

## 2018-03-25 ENCOUNTER — Other Ambulatory Visit: Payer: Self-pay

## 2018-03-25 DIAGNOSIS — H2511 Age-related nuclear cataract, right eye: Secondary | ICD-10-CM | POA: Diagnosis not present

## 2018-03-25 DIAGNOSIS — Z79899 Other long term (current) drug therapy: Secondary | ICD-10-CM | POA: Insufficient documentation

## 2018-03-25 DIAGNOSIS — H25011 Cortical age-related cataract, right eye: Secondary | ICD-10-CM | POA: Diagnosis not present

## 2018-03-25 DIAGNOSIS — E785 Hyperlipidemia, unspecified: Secondary | ICD-10-CM | POA: Diagnosis not present

## 2018-03-25 DIAGNOSIS — Z7982 Long term (current) use of aspirin: Secondary | ICD-10-CM | POA: Diagnosis not present

## 2018-03-25 DIAGNOSIS — F329 Major depressive disorder, single episode, unspecified: Secondary | ICD-10-CM | POA: Diagnosis not present

## 2018-03-25 DIAGNOSIS — M797 Fibromyalgia: Secondary | ICD-10-CM | POA: Insufficient documentation

## 2018-03-25 DIAGNOSIS — F17200 Nicotine dependence, unspecified, uncomplicated: Secondary | ICD-10-CM | POA: Diagnosis not present

## 2018-03-25 HISTORY — DX: Depression, unspecified: F32.A

## 2018-03-25 HISTORY — PX: CATARACT EXTRACTION W/PHACO: SHX586

## 2018-03-25 HISTORY — DX: Major depressive disorder, single episode, unspecified: F32.9

## 2018-03-25 SURGERY — PHACOEMULSIFICATION, CATARACT, WITH IOL INSERTION
Anesthesia: Monitor Anesthesia Care | Site: Eye | Laterality: Right

## 2018-03-25 MED ORDER — ARMC OPHTHALMIC DILATING DROPS
OPHTHALMIC | Status: AC
  Filled 2018-03-25: qty 0.5

## 2018-03-25 MED ORDER — MIDAZOLAM HCL 2 MG/2ML IJ SOLN
INTRAMUSCULAR | Status: DC | PRN
  Administered 2018-03-25: 2 mg via INTRAVENOUS

## 2018-03-25 MED ORDER — EPINEPHRINE PF 1 MG/ML IJ SOLN
INTRAMUSCULAR | Status: AC
  Filled 2018-03-25: qty 1

## 2018-03-25 MED ORDER — LIDOCAINE HCL (PF) 4 % IJ SOLN
INTRAMUSCULAR | Status: AC
  Filled 2018-03-25: qty 5

## 2018-03-25 MED ORDER — EPINEPHRINE PF 1 MG/ML IJ SOLN
INTRAOCULAR | Status: DC | PRN
  Administered 2018-03-25: 1 mL via OPHTHALMIC

## 2018-03-25 MED ORDER — MOXIFLOXACIN HCL 0.5 % OP SOLN: 1.0000 [drp] | OPHTHALMIC | Status: DC | PRN

## 2018-03-25 MED ORDER — POVIDONE-IODINE 5 % OP SOLN
OPHTHALMIC | Status: DC | PRN
  Administered 2018-03-25: 1 via OPHTHALMIC

## 2018-03-25 MED ORDER — SODIUM HYALURONATE 23 MG/ML IO SOLN: INTRAOCULAR | Status: DC | PRN

## 2018-03-25 MED ORDER — TRYPAN BLUE 0.06 % OP SOLN
OPHTHALMIC | Status: AC
  Filled 2018-03-25: qty 0.5

## 2018-03-25 MED ORDER — LIDOCAINE HCL (PF) 4 % IJ SOLN
INTRAOCULAR | Status: DC | PRN
  Administered 2018-03-25: 2 mL via OPHTHALMIC

## 2018-03-25 MED ORDER — MOXIFLOXACIN HCL 0.5 % OP SOLN
OPHTHALMIC | Status: DC | PRN
  Administered 2018-03-25: .2 mL via OPHTHALMIC

## 2018-03-25 MED ORDER — FENTANYL CITRATE (PF) 100 MCG/2ML IJ SOLN
INTRAMUSCULAR | Status: AC
  Filled 2018-03-25: qty 2

## 2018-03-25 MED ORDER — TETRACAINE HCL 0.5 % OP SOLN
1.0000 [drp] | OPHTHALMIC | Status: AC | PRN
  Administered 2018-03-25 (×3): 1 [drp] via OPHTHALMIC

## 2018-03-25 MED ORDER — TRYPAN BLUE 0.06 % OP SOLN
OPHTHALMIC | Status: DC | PRN
  Administered 2018-03-25: .5 mL via INTRAOCULAR

## 2018-03-25 MED ORDER — MIDAZOLAM HCL 2 MG/2ML IJ SOLN
INTRAMUSCULAR | Status: AC
  Filled 2018-03-25: qty 2

## 2018-03-25 MED ORDER — POVIDONE-IODINE 5 % OP SOLN
OPHTHALMIC | Status: AC
  Filled 2018-03-25: qty 60

## 2018-03-25 MED ORDER — NA HYALUR & NA CHOND-NA HYALUR 0.55-0.5 ML IO KIT
PACK | INTRAOCULAR | Status: DC | PRN
  Administered 2018-03-25: 1 via OPHTHALMIC

## 2018-03-25 MED ORDER — FENTANYL CITRATE (PF) 100 MCG/2ML IJ SOLN
INTRAMUSCULAR | Status: DC | PRN
  Administered 2018-03-25: 25 ug via INTRAVENOUS

## 2018-03-25 MED ORDER — SODIUM HYALURONATE 23 MG/ML IO SOLN
INTRAOCULAR | Status: AC
  Filled 2018-03-25: qty 0.6

## 2018-03-25 MED ORDER — NA HYALUR & NA CHOND-NA HYALUR 0.55-0.5 ML IO KIT
PACK | INTRAOCULAR | Status: AC
  Filled 2018-03-25: qty 1.05

## 2018-03-25 MED ORDER — SODIUM CHLORIDE 0.9 % IV SOLN
INTRAVENOUS | Status: DC
  Administered 2018-03-25: 09:00:00 via INTRAVENOUS

## 2018-03-25 MED ORDER — ONDANSETRON HCL 4 MG/2ML IJ SOLN
INTRAMUSCULAR | Status: DC | PRN
  Administered 2018-03-25: 4 mg via INTRAVENOUS

## 2018-03-25 MED ORDER — ARMC OPHTHALMIC DILATING DROPS
1.0000 "application " | OPHTHALMIC | Status: AC
  Administered 2018-03-25 (×3): 1 via OPHTHALMIC

## 2018-03-25 MED ORDER — CARBACHOL 0.01 % IO SOLN
INTRAOCULAR | Status: DC | PRN
  Administered 2018-03-25: .5 mL via INTRAOCULAR

## 2018-03-25 MED ORDER — MOXIFLOXACIN HCL 0.5 % OP SOLN
OPHTHALMIC | Status: AC
  Filled 2018-03-25: qty 3

## 2018-03-25 MED ORDER — TETRACAINE HCL 0.5 % OP SOLN
OPHTHALMIC | Status: AC
  Filled 2018-03-25: qty 4

## 2018-03-25 SURGICAL SUPPLY — 17 items
DISSECTOR HYDRO NUCLEUS 50X22 (MISCELLANEOUS) ×8 IMPLANT
GLOVE BIOGEL M 6.5 STRL (GLOVE) ×2 IMPLANT
GOWN STRL REUS W/ TWL LRG LVL3 (GOWN DISPOSABLE) ×1 IMPLANT
GOWN STRL REUS W/ TWL XL LVL3 (GOWN DISPOSABLE) ×1 IMPLANT
GOWN STRL REUS W/TWL LRG LVL3 (GOWN DISPOSABLE) ×2
GOWN STRL REUS W/TWL XL LVL3 (GOWN DISPOSABLE) ×2
KNIFE 45D UP 2.3 (MISCELLANEOUS) ×2 IMPLANT
LABEL CATARACT MEDS ST (LABEL) ×2 IMPLANT
LENS IOL ACRYSOF IQ 22.0 (Intraocular Lens) ×2 IMPLANT
PACK CATARACT (MISCELLANEOUS) ×2 IMPLANT
PACK CATARACT KING (MISCELLANEOUS) ×2 IMPLANT
PACK EYE AFTER SURG (MISCELLANEOUS) ×2 IMPLANT
SOL BSS BAG (MISCELLANEOUS) ×2
SOLUTION BSS BAG (MISCELLANEOUS) ×1 IMPLANT
SYR 5ML LL (SYRINGE) ×2 IMPLANT
WATER STERILE IRR 250ML POUR (IV SOLUTION) ×2 IMPLANT
WIPE NON LINTING 3.25X3.25 (MISCELLANEOUS) ×2 IMPLANT

## 2018-03-25 NOTE — Op Note (Signed)
  PREOPERATIVE DIAGNOSIS:  Nuclear sclerotic cataract of the RIGHT eye.   POSTOPERATIVE DIAGNOSIS:  Nuclear sclerotic cataract of the RIGHT eye.   OPERATIVE PROCEDURE: Cataract surgery OD   SURGEON:  Marchia Meiers, MD.   ANESTHESIA:  Anesthesiologist: Gunnar Bulla, MD CRNA: Kelton Pillar, CRNA  1.      Managed anesthesia care. 2.     0.38ml of Shugarcaine was instilled following the paracentesis   COMPLICATIONS:  None.   TECHNIQUE:   Divide and conquer   DESCRIPTION OF PROCEDURE:  The patient was examined and consented in the preoperative holding area where the aforementioned topical anesthesia was applied to the RIGHT eye and then brought back to the Operating Room where the left eye was prepped and draped in the usual sterile ophthalmic fashion and a lid speculum was placed. A paracentesis was created with the side port blade, the anterior chamber was washed out with trypan blue to stain the anterior capsule, and the anterior chamber was filled with viscoelastic. A near clear corneal incision was performed with the steel keratome. A continuous curvilinear capsulorrhexis was performed with a cystotome followed by the capsulorrhexis forceps. Hydrodissection and hydrodelineation were carried out with BSS on a blunt cannula. The lens was removed in a divide and conquer  technique and the remaining cortical material was removed with the irrigation-aspiration handpiece. The capsular bag was inflated with viscoelastic and the lens was placed in the capsular bag without complication. The remaining viscoelastic was removed from the eye with the irrigation-aspiration handpiece. The wounds were hydrated. The anterior chamber was flushed and the eye was inflated to physiologic pressure. 0.47ml Vigamox was placed in the anterior chamber. The wounds were found to be water tight. The eye was dressed with Vigamox. The patient was given protective glasses to wear throughout the day and a shield with  which to sleep tonight. The patient was also given drops with which to begin a drop regimen today and will follow-up with me in one day. Implant Name Type Inv. Item Serial No. Manufacturer Lot No. LRB No. Used  LENS IOL ACRYSOF IQ 22.0 - [id number] Intraocular Lens LENS IOL ACRYSOF IQ 22.0 [id number] ALCON  Right 1    Procedure(s) with comments: CATARACT EXTRACTION PHACO AND INTRAOCULAR LENS PLACEMENT (IOC) RIGHT (Right) - [place]  01:11 CDE 8.02 Fluid pack lot # [id number]  Electronically signed: Areatha Kalata 03/25/2018 11:12 AM

## 2018-03-25 NOTE — Anesthesia Post-op Follow-up Note (Signed)
Anesthesia QCDR form completed.        

## 2018-03-25 NOTE — Transfer of Care (Signed)
Immediate Anesthesia Transfer of Care Note  Patient: [name] [name]  Procedure(s) Performed: CATARACT EXTRACTION PHACO AND INTRAOCULAR LENS PLACEMENT ([facility]) RIGHT (Right Eye)  Patient Location: PACU  Anesthesia Type:MAC  Level of Consciousness: awake, alert , oriented and patient cooperative  Airway & Oxygen Therapy: Patient Spontanous Breathing  Post-op Assessment: Report given to RN and Post -op Vital signs reviewed and stable  Post vital signs: Reviewed and stable  Last Vitals:  Vitals Value Taken Time  BP 135/71 03/25/2018 11:14 AM  Temp    Pulse 57 03/25/2018 11:14 AM  Resp 16 03/25/2018 11:14 AM  SpO2 100 % 03/25/2018 11:14 AM    Last Pain:  Vitals:   03/25/18 1114  TempSrc:   PainSc: 0-No pain         Complications: No apparent anesthesia complications

## 2018-03-25 NOTE — Discharge Instructions (Signed)
Eye Surgery Discharge Instructions    Expect mild scratchy sensation or mild soreness. DO NOT RUB YOUR EYE!  The day of surgery:  Minimal physical activity, but bed rest is not required  No reading, computer work, or close hand work  No bending, lifting, or straining.  May watch TV  For 24 hours:  No driving, legal decisions, or alcoholic beverages  Safety precautions  Eat anything you prefer: It is better to start with liquids, then soup then solid foods.  _____ Eye patch should be worn until postoperative exam tomorrow.  ____ Solar shield eyeglasses should be worn for comfort in the sunlight/patch while sleeping  Resume all regular medications including aspirin or Coumadin if these were discontinued prior to surgery. You may shower, bathe, shave, or wash your hair. Tylenol may be taken for mild discomfort.  Call your doctor if you experience significant pain, nausea, or vomiting, fever > 101 or other signs of infection. [phone] or [phone] Specific instructions:  Follow-up Information    Marchia Meiers, MD Follow up on 03/26/2018.   Specialty:  Ophthalmology Why:  appointment time @ 9:30 AM Contact information: [place] [address] [phone]

## 2018-03-25 NOTE — Anesthesia Preprocedure Evaluation (Addendum)
Anesthesia Evaluation  Patient identified by MRN, date of birth, ID band Patient awake    Reviewed: Allergy & Precautions, NPO status , Patient's Chart, lab work & pertinent test results, reviewed documented beta blocker date and time   Airway Mallampati: II  TM Distance: >3 FB     Dental  (+) Chipped   Pulmonary Current Smoker,           Cardiovascular      Neuro/Psych Depression  Neuromuscular disease    GI/Hepatic   Endo/Other    Renal/GU      Musculoskeletal  (+) Arthritis , Fibromyalgia -  Abdominal   Peds  Hematology   Anesthesia Other Findings   Reproductive/Obstetrics                             Anesthesia Physical Anesthesia Plan  ASA: III  Anesthesia Plan: MAC   Post-op Pain Management:    Induction:   PONV Risk Score and Plan:   Airway Management Planned:   Additional Equipment:   Intra-op Plan:   Post-operative Plan:   Informed Consent: I have reviewed the patients History and Physical, chart, labs and discussed the procedure including the risks, benefits and alternatives for the proposed anesthesia with the patient or authorized representative who has indicated his/her understanding and acceptance.       Plan Discussed with: CRNA  Anesthesia Plan Comments:         Anesthesia Quick Evaluation

## 2018-03-25 NOTE — Anesthesia Postprocedure Evaluation (Signed)
Anesthesia Post Note  Patient: [name] [name]  Procedure(s) Performed: CATARACT EXTRACTION PHACO AND INTRAOCULAR LENS PLACEMENT (I[facility]) RIGHT (Right Eye)  Patient location during evaluation: PACU Anesthesia Type: MAC Level of consciousness: awake and alert Pain management: pain level controlled Vital Signs Assessment: post-procedure vital signs reviewed and stable Respiratory status: spontaneous breathing, nonlabored ventilation, respiratory function stable and patient connected to nasal cannula oxygen Cardiovascular status: stable and blood pressure returned to baseline Postop Assessment: no apparent nausea or vomiting Anesthetic complications: no     Last Vitals:  Vitals:   03/25/18 0831 03/25/18 1114  BP: 124/68 135/71  Pulse: 62 (!) 57  Resp: 16 16  Temp: 36.6 C   SpO2: 97% 100%    Last Pain:  Vitals:   03/25/18 1114  TempSrc:   PainSc: 0-No pain                 Zannie Locastro S

## 2018-03-25 NOTE — H&P (Signed)
   I have reviewed the patient's H&P and agree with its findings. There have been no interval changes.  Alika Saladin MD Ophthalmology 

## 2018-04-15 DIAGNOSIS — H2512 Age-related nuclear cataract, left eye: Secondary | ICD-10-CM | POA: Diagnosis not present

## 2018-04-19 ENCOUNTER — Encounter: Payer: Self-pay | Admitting: *Deleted

## 2018-04-22 ENCOUNTER — Ambulatory Visit: Payer: PPO | Admitting: Anesthesiology

## 2018-04-22 ENCOUNTER — Other Ambulatory Visit: Payer: Self-pay

## 2018-04-22 ENCOUNTER — Ambulatory Visit
Admission: RE | Admit: 2018-04-22 | Discharge: 2018-04-22 | Disposition: A | Discharge status: Home / Self Care | Payer: PPO | Attending: Ophthalmology | Admitting: Ophthalmology

## 2018-04-22 ENCOUNTER — Encounter
Admission: RE | Disposition: A | Discharge status: Home / Self Care | Payer: Self-pay | Source: Home / Self Care | Attending: Ophthalmology

## 2018-04-22 ENCOUNTER — Encounter: Payer: Self-pay | Admitting: *Deleted

## 2018-04-22 DIAGNOSIS — H2512 Age-related nuclear cataract, left eye: Secondary | ICD-10-CM | POA: Insufficient documentation

## 2018-04-22 DIAGNOSIS — Z7982 Long term (current) use of aspirin: Secondary | ICD-10-CM | POA: Insufficient documentation

## 2018-04-22 DIAGNOSIS — F329 Major depressive disorder, single episode, unspecified: Secondary | ICD-10-CM | POA: Insufficient documentation

## 2018-04-22 DIAGNOSIS — M1990 Unspecified osteoarthritis, unspecified site: Secondary | ICD-10-CM | POA: Diagnosis not present

## 2018-04-22 DIAGNOSIS — M797 Fibromyalgia: Secondary | ICD-10-CM | POA: Insufficient documentation

## 2018-04-22 DIAGNOSIS — F17200 Nicotine dependence, unspecified, uncomplicated: Secondary | ICD-10-CM | POA: Diagnosis not present

## 2018-04-22 DIAGNOSIS — Z79899 Other long term (current) drug therapy: Secondary | ICD-10-CM | POA: Diagnosis not present

## 2018-04-22 HISTORY — PX: CATARACT EXTRACTION W/PHACO: SHX586

## 2018-04-22 SURGERY — PHACOEMULSIFICATION, CATARACT, WITH IOL INSERTION
Anesthesia: Monitor Anesthesia Care | Site: Eye | Laterality: Left

## 2018-04-22 MED ORDER — POVIDONE-IODINE 5 % OP SOLN
OPHTHALMIC | Status: AC
  Filled 2018-04-22: qty 60

## 2018-04-22 MED ORDER — MOXIFLOXACIN HCL 0.5 % OP SOLN: 1.0000 [drp] | OPHTHALMIC | Status: DC | PRN

## 2018-04-22 MED ORDER — EPINEPHRINE PF 1 MG/ML IJ SOLN
INTRAOCULAR | Status: DC | PRN
  Administered 2018-04-22: 1 mL via OPHTHALMIC

## 2018-04-22 MED ORDER — SODIUM CHLORIDE 0.9 % IV SOLN
INTRAVENOUS | Status: DC
  Administered 2018-04-22: 08:00:00 via INTRAVENOUS

## 2018-04-22 MED ORDER — NA HYALUR & NA CHOND-NA HYALUR 0.55-0.5 ML IO KIT
PACK | INTRAOCULAR | Status: AC
  Filled 2018-04-22: qty 1.05

## 2018-04-22 MED ORDER — NA HYALUR & NA CHOND-NA HYALUR 0.55-0.5 ML IO KIT
PACK | INTRAOCULAR | Status: DC | PRN
  Administered 2018-04-22: 1 via OPHTHALMIC

## 2018-04-22 MED ORDER — FENTANYL CITRATE (PF) 100 MCG/2ML IJ SOLN
INTRAMUSCULAR | Status: DC | PRN
  Administered 2018-04-22 (×4): 25 ug via INTRAVENOUS

## 2018-04-22 MED ORDER — TETRACAINE HCL 0.5 % OP SOLN
1.0000 [drp] | OPHTHALMIC | Status: AC | PRN
  Administered 2018-04-22 (×3): 1 [drp] via OPHTHALMIC

## 2018-04-22 MED ORDER — LIDOCAINE HCL (PF) 4 % IJ SOLN
INTRAOCULAR | Status: DC | PRN
  Administered 2018-04-22: 2 mL via OPHTHALMIC

## 2018-04-22 MED ORDER — ONDANSETRON HCL 4 MG/2ML IJ SOLN
INTRAMUSCULAR | Status: DC | PRN
  Administered 2018-04-22: 4 mg via INTRAVENOUS

## 2018-04-22 MED ORDER — ARMC OPHTHALMIC DILATING DROPS
OPHTHALMIC | Status: AC
  Administered 2018-04-22: 1 via OPHTHALMIC
  Filled 2018-04-22: qty 0.5

## 2018-04-22 MED ORDER — ARMC OPHTHALMIC DILATING DROPS
1.0000 "application " | OPHTHALMIC | Status: AC
  Administered 2018-04-22 (×3): 1 via OPHTHALMIC

## 2018-04-22 MED ORDER — CARBACHOL 0.01 % IO SOLN
INTRAOCULAR | Status: DC | PRN
  Administered 2018-04-22: .5 mL via INTRAOCULAR

## 2018-04-22 MED ORDER — LIDOCAINE HCL (PF) 4 % IJ SOLN
INTRAMUSCULAR | Status: AC
  Filled 2018-04-22: qty 5

## 2018-04-22 MED ORDER — ONDANSETRON HCL 4 MG/2ML IJ SOLN
INTRAMUSCULAR | Status: AC
  Filled 2018-04-22: qty 2

## 2018-04-22 MED ORDER — NA CHONDROIT SULF-NA HYALURON 40-17 MG/ML IO SOLN
INTRAOCULAR | Status: DC | PRN
  Administered 2018-04-22: 1 mL via INTRAOCULAR

## 2018-04-22 MED ORDER — MOXIFLOXACIN HCL 0.5 % OP SOLN
OPHTHALMIC | Status: AC
  Filled 2018-04-22: qty 3

## 2018-04-22 MED ORDER — EPINEPHRINE PF 1 MG/ML IJ SOLN
INTRAMUSCULAR | Status: AC
  Filled 2018-04-22: qty 1

## 2018-04-22 MED ORDER — TETRACAINE HCL 0.5 % OP SOLN
OPHTHALMIC | Status: AC
  Administered 2018-04-22: 1 [drp] via OPHTHALMIC
  Filled 2018-04-22: qty 4

## 2018-04-22 MED ORDER — FENTANYL CITRATE (PF) 100 MCG/2ML IJ SOLN
INTRAMUSCULAR | Status: AC
  Filled 2018-04-22: qty 2

## 2018-04-22 MED ORDER — MIDAZOLAM HCL 2 MG/2ML IJ SOLN
INTRAMUSCULAR | Status: DC | PRN
  Administered 2018-04-22: 2 mg via INTRAVENOUS

## 2018-04-22 MED ORDER — TRYPAN BLUE 0.06 % OP SOLN
OPHTHALMIC | Status: AC
  Filled 2018-04-22: qty 0.5

## 2018-04-22 MED ORDER — MIDAZOLAM HCL 2 MG/2ML IJ SOLN
INTRAMUSCULAR | Status: AC
  Filled 2018-04-22: qty 2

## 2018-04-22 MED ORDER — POVIDONE-IODINE 5 % OP SOLN
OPHTHALMIC | Status: DC | PRN
  Administered 2018-04-22: 1 via OPHTHALMIC

## 2018-04-22 MED ORDER — MOXIFLOXACIN HCL 0.5 % OP SOLN
OPHTHALMIC | Status: DC | PRN
  Administered 2018-04-22: .2 mL via OPHTHALMIC

## 2018-04-22 MED ORDER — TRYPAN BLUE 0.06 % OP SOLN
OPHTHALMIC | Status: DC | PRN
  Administered 2018-04-22: .5 mL via INTRAOCULAR

## 2018-04-22 MED ORDER — NA CHONDROIT SULF-NA HYALURON 40-17 MG/ML IO SOLN
INTRAOCULAR | Status: AC
  Filled 2018-04-22: qty 1

## 2018-04-22 SURGICAL SUPPLY — 17 items
DISSECTOR HYDRO NUCLEUS 50X22 (MISCELLANEOUS) ×8 IMPLANT
GLOVE BIOGEL M 6.5 STRL (GLOVE) ×2 IMPLANT
GOWN STRL REUS W/ TWL LRG LVL3 (GOWN DISPOSABLE) ×1 IMPLANT
GOWN STRL REUS W/ TWL XL LVL3 (GOWN DISPOSABLE) ×1 IMPLANT
GOWN STRL REUS W/TWL LRG LVL3 (GOWN DISPOSABLE) ×1
GOWN STRL REUS W/TWL XL LVL3 (GOWN DISPOSABLE) ×1
KNIFE 45D UP 2.3 (MISCELLANEOUS) ×2 IMPLANT
LABEL CATARACT MEDS ST (LABEL) ×2 IMPLANT
LENS IOL ACRYSOF IQ 21.5 (Intraocular Lens) ×2 IMPLANT
PACK CATARACT (MISCELLANEOUS) ×2 IMPLANT
PACK CATARACT KING (MISCELLANEOUS) ×2 IMPLANT
PACK EYE AFTER SURG (MISCELLANEOUS) ×2 IMPLANT
SOL BSS BAG (MISCELLANEOUS) ×2
SOLUTION BSS BAG (MISCELLANEOUS) ×1 IMPLANT
SYR 5ML LL (SYRINGE) ×2 IMPLANT
WATER STERILE IRR 250ML POUR (IV SOLUTION) ×2 IMPLANT
WIPE NON LINTING 3.25X3.25 (MISCELLANEOUS) ×2 IMPLANT

## 2018-04-22 NOTE — Discharge Instructions (Addendum)
Eye Surgery Discharge Instructions  Expect mild scratchy sensation or mild soreness. DO NOT RUB YOUR EYE!  The day of surgery:  Minimal physical activity, but bed rest is not required  No reading, computer work, or close hand work  No bending, lifting, or straining.  May watch TV  For 24 hours:  No driving, legal decisions, or alcoholic beverages  Safety precautions  Eat anything you prefer: It is better to start with liquids, then soup then solid foods. *     Solar shield eyeglasses should be worn for comfort in the sunlight/patch while sleeping  Resume all regular medications including aspirin or Coumadin if these were discontinued prior to surgery. You may shower, bathe, shave, or wash your hair. Tylenol may be taken for mild discomfort. Follow Dr. Sharene Butters eye drop instruction sheet as reviewed.  Call your doctor if you experience significant pain, nausea, or vomiting, fever > 101 or other signs of infection. [phone] or [phone] Specific instructions:  Follow-up Information    Marchia Meiers, MD Follow up.   Specialty:  Ophthalmology Why:  04/23/18 @ 9:30 am Contact information: [place] [address] [phone]

## 2018-04-22 NOTE — Op Note (Signed)
  PREOPERATIVE DIAGNOSIS:  Nuclear sclerotic cataract of the LEFT eye.   POSTOPERATIVE DIAGNOSIS:  Nuclear sclerotic cataract of the LEFT eye.   OPERATIVE PROCEDURE: Cataract surgery OS   SURGEON:  Marchia Meiers, MD.   ANESTHESIA:  Anesthesiologist: Emmie Niemann, MD CRNA: Lavone Orn, CRNA  1.      Managed anesthesia care. 2.     0.109ml of Shugarcaine was instilled following the paracentesis   COMPLICATIONS:  None.   TECHNIQUE:   Divide and conquer   DESCRIPTION OF PROCEDURE:  The patient was examined and consented in the preoperative holding area where the aforementioned topical anesthesia was applied to the LEFT eye and then brought back to the Operating Room where the left eye was prepped and draped in the usual sterile ophthalmic fashion and a lid speculum was placed. A paracentesis was created with the side port blade, the anterior chamber was washed out with trypan blue to stain the anterior capsule, and the anterior chamber was filled with viscoelastic. A near clear corneal incision was performed with the steel keratome. A continuous curvilinear capsulorrhexis was performed with a cystotome followed by the capsulorrhexis forceps. Hydrodissection and hydrodelineation were carried out with BSS on a blunt cannula. The lens was removed in a divide and conquer  technique and the remaining cortical material was removed with the irrigation-aspiration handpiece. The capsular bag was inflated with viscoelastic and the lens was placed in the capsular bag without complication. The remaining viscoelastic was removed from the eye with the irrigation-aspiration handpiece. The wounds were hydrated. The anterior chamber was flushed and the eye was inflated to physiologic pressure. 0.2ml Vigamox was placed in the anterior chamber. The wounds were found to be water tight. The eye was dressed with Vigamox. The patient was given protective glasses to wear throughout the day and a shield with  which to sleep tonight. The patient was also given drops with which to begin a drop regimen today and will follow-up with me in one day. Implant Name Type Inv. Item Serial No. Manufacturer Lot No. LRB No. Used  LENS IOL ACRYSOF IQ 21.5 - [id number] Intraocular Lens LENS IOL ACRYSOF IQ 21.5 [id number] [employer]CON  Left 1    Procedure(s) with comments: CATARACT EXTRACTION PHACO AND INTRAOCULAR LENS PLACEMENT (IOC) Left Eye (Left) - [place]  01:02 CDE 8.11 Fluid pack lot # [id number]  Electronically signed: Marchia Meiers 04/22/2018 10:24 AM

## 2018-04-22 NOTE — Anesthesia Postprocedure Evaluation (Signed)
Anesthesia Post Note  Patient: [name] [name]  Procedure(s) Performed: CATARACT EXTRACTION PHACO AND INTRAOCULAR LENS PLACEMENT ([place]) Left Eye (Left Eye)  Patient location during evaluation: Phase II Anesthesia Type: MAC Level of consciousness: awake Pain management: pain level controlled Vital Signs Assessment: post-procedure vital signs reviewed and stable Respiratory status: spontaneous breathing Cardiovascular status: blood pressure returned to baseline Postop Assessment: no apparent nausea or vomiting and adequate PO intake Anesthetic complications: no     Last Vitals:  Vitals:   04/22/18 0817 04/22/18 1026  BP: 127/75 134/76  Pulse: 66 67  Resp: 16 16  Temp: 36.6 C (!) 36.2 C  SpO2: 94% 97%    Last Pain:  Vitals:   04/22/18 1026  TempSrc: Temporal  PainSc: 0-No pain                 Lavone Orn

## 2018-04-22 NOTE — Anesthesia Post-op Follow-up Note (Signed)
Anesthesia QCDR form completed.        

## 2018-04-22 NOTE — Transfer of Care (Signed)
Immediate Anesthesia Transfer of Care Note  Patient: [name] [name]  Procedure(s) Performed: CATARACT EXTRACTION PHACO AND INTRAOCULAR LENS PLACEMENT ([place]) Left Eye (Left Eye)  Patient Location: PACU  Anesthesia Type:MAC  Level of Consciousness: awake  Airway & Oxygen Therapy: Patient Spontanous Breathing  Post-op Assessment: Report given to RN  Post vital signs: stable  Last Vitals:  Vitals Value Taken Time  BP    Temp    Pulse    Resp    SpO2      Last Pain:  Vitals:   04/22/18 0817  TempSrc: Oral  PainSc: 0-No pain         Complications: No apparent anesthesia complications

## 2018-04-22 NOTE — H&P (Signed)
   I have reviewed the patient's H&P and agree with its findings. There have been no interval changes.  Jayshawn Colston MD Ophthalmology 

## 2018-04-22 NOTE — Anesthesia Preprocedure Evaluation (Signed)
Anesthesia Evaluation  Patient identified by MRN, date of birth, ID band Patient awake    Reviewed: Allergy & Precautions, NPO status , Patient's Chart, lab work & pertinent test results  History of Anesthesia Complications Negative for: history of anesthetic complications  Airway Mallampati: II  TM Distance: >3 FB Neck ROM: Full    Dental  (+) Upper Dentures, Lower Dentures   Pulmonary neg sleep apnea, neg COPD, Current Smoker,    breath sounds clear to auscultation- rhonchi (-) wheezing      Cardiovascular Exercise Tolerance: Good (-) hypertension(-) CAD, (-) Past MI, (-) Cardiac Stents and (-) CABG  Rhythm:Regular Rate:Normal - Systolic murmurs and - Diastolic murmurs    Neuro/Psych neg Seizures PSYCHIATRIC DISORDERS Depression negative neurological ROS     GI/Hepatic negative GI ROS, Neg liver ROS,   Endo/Other  negative endocrine ROSneg diabetes  Renal/GU negative Renal ROS     Musculoskeletal  (+) Arthritis , Fibromyalgia -  Abdominal (+) - obese,   Peds  Hematology negative hematology ROS (+)   Anesthesia Other Findings Past Medical History: No date: Arthritis     Comment:  OA No date: Carpal tunnel syndrome No date: Depression No date: Endometriosis No date: Fibromyalgia No date: Post herpetic neuralgia No date: Sleep disorder No date: Tobacco abuse   Reproductive/Obstetrics                             Anesthesia Physical Anesthesia Plan  ASA: II  Anesthesia Plan: MAC   Post-op Pain Management:    Induction: Intravenous  PONV Risk Score and Plan: 2 and Midazolam  Airway Management Planned: Natural Airway  Additional Equipment:   Intra-op Plan:   Post-operative Plan:   Informed Consent: I have reviewed the patients History and Physical, chart, labs and discussed the procedure including the risks, benefits and alternatives for the proposed anesthesia with the  patient or authorized representative who has indicated his/her understanding and acceptance.       Plan Discussed with: CRNA and Anesthesiologist  Anesthesia Plan Comments:         Anesthesia Quick Evaluation

## 2018-04-23 ENCOUNTER — Encounter: Payer: Self-pay | Admitting: Ophthalmology

## 2018-06-01 ENCOUNTER — Other Ambulatory Visit: Payer: Self-pay | Admitting: Family Medicine

## 2018-06-02 NOTE — Telephone Encounter (Signed)
Name of Medication: Tramadol/ Lyrica Name of Pharmacy: [facility] or Written Date and Quantity: Tramadol: 03/04/18 #90 tabs 0 refills/ Lyrica: 11/24/17 #180 caps with 1 refills Last Office Visit and Type: CPE/AWV on 11/24/17 Next Office Visit and Type: CPE/AWV on 11/30/2018 Last Controlled Substance Agreement Date: 01/02/15 Last UDS:01/02/15

## 2018-11-16 DIAGNOSIS — D2310 Other benign neoplasm of skin of unspecified eyelid, including canthus: Secondary | ICD-10-CM | POA: Diagnosis not present

## 2018-11-24 ENCOUNTER — Ambulatory Visit (INDEPENDENT_AMBULATORY_CARE_PROVIDER_SITE_OTHER): Payer: PPO

## 2018-11-24 ENCOUNTER — Other Ambulatory Visit: Payer: Self-pay | Admitting: *Deleted

## 2018-11-24 DIAGNOSIS — Z0000 Encounter for general adult medical examination without abnormal findings: Secondary | ICD-10-CM | POA: Diagnosis not present

## 2018-11-24 MED ORDER — BUSPIRONE HCL 15 MG PO TABS: 7.5000 mg | ORAL_TABLET | Freq: Two times a day (BID) | ORAL | 0 refills | Status: DC

## 2018-11-24 MED ORDER — ATORVASTATIN CALCIUM 10 MG PO TABS: 10.0000 mg | ORAL_TABLET | Freq: Every day | ORAL | 0 refills | Status: DC

## 2018-11-24 MED ORDER — DULOXETINE HCL 60 MG PO CPEP: 60.0000 mg | ORAL_CAPSULE | Freq: Every day | ORAL | 0 refills | Status: DC

## 2018-11-24 NOTE — Progress Notes (Signed)
Subjective:   [name] [name] is a 68 y.o. female who presents for Medicare Annual (Subsequent) preventive examination.  Review of Systems:    This visit is being conducted through telemedicine due to the COVID-19 pandemic. This patient has given me verbal consent via doximity to conduct this visit, patient states they are participating from their home address. Some vital signs may be absent or patient reported.    Patient identification: identified by name, DOB, and current address  Cardiac Risk Factors include: advanced age (>51men, >75 women);dyslipidemia;smoking/ tobacco exposure     Objective:     Vitals: There were no vitals taken for this visit.  There is no height or weight on file to calculate BMI.  Advanced Directives 11/24/2018 03/25/2018 11/24/2017 09/12/2016  Does Patient Have a Medical Advance Directive? Yes Yes Yes Yes  Type of Paramedic of [place];Living will [place];Living will [place];Living will [place];Living will  Does patient want to make changes to medical advance directive? - No - Patient declined - -  Copy of [place] in Chart? No - copy requested No - copy requested No - copy requested No - copy requested    Tobacco Social History   Tobacco Use  Smoking Status Current Every Day Smoker  . Packs/day: 0.30  Smokeless Tobacco Never Used  Tobacco Comment   less than 1/2 a pack     Ready to quit: No Counseling given: Not Answered Comment: less than 1/2 a pack   Clinical Intake:  Pre-visit preparation completed: Yes  Pain : No/denies pain     Nutritional Risks: Nausea/ vomitting/ diarrhea Diabetes: No  How often do you need to have someone help you when you read instructions, pamphlets, or other written materials from your doctor or pharmacy?: 1 - Never What is the last grade level you completed in school?: 11th  Interpreter Needed?: No   Information entered by :: CJohnson, LPN  Past Medical History:  Diagnosis Date  . Arthritis    OA  . Carpal tunnel syndrome   . Depression   . Endometriosis   . Fibromyalgia   . Post herpetic neuralgia   . Sleep disorder   . Tobacco abuse    Past Surgical History:  Procedure Laterality Date  . ABDOMINAL HYSTERECTOMY  1992   total endometriosis  . APPENDECTOMY  1992  . bladder tack    . CATARACT EXTRACTION W/PHACO Right 03/25/2018   Procedure: CATARACT EXTRACTION PHACO AND INTRAOCULAR LENS PLACEMENT ([place]) RIGHT;  Surgeon: Marchia Meiers, MD;  Location: [facility] ORS;  Service: Ophthalmology;  Laterality: Right;  [place]  01:11 CDE 8.02 Fluid pack lot # [id number]  . CATARACT EXTRACTION W/PHACO Left 04/22/2018   Procedure: CATARACT EXTRACTION PHACO AND INTRAOCULAR LENS PLACEMENT ([place]) Left Eye;  Surgeon: Marchia Meiers, MD;  Location: [facility] ORS;  Service: Ophthalmology;  Laterality: Left;  [place]  01:02 CDE 8.11 Fluid pack lot # [id number]  . FOOT SURGERY Right 04/2016   [facility] in [place]   Family History  Problem Relation Age of Onset  . Heart disease Mother        MI  . Heart disease Father        MI  . Arthritis Sister        crippling arthritis  . Arthritis Brother        crippling arthritis   Social History   Socioeconomic History  . Marital status: Widowed  Spouse name: Not on file  . Number of children: Not on file  . Years of education: Not on file  . Highest education level: Not on file  Occupational History  . Not on file  Social Needs  . Financial resource strain: Not hard at all  . Food insecurity    Worry: Never true    Inability: Never true  . Transportation needs    Medical: No    Non-medical: No  Tobacco Use  . Smoking status: Current Every Day Smoker    Packs/day: 0.30  . Smokeless tobacco: Never Used  . Tobacco comment: less than 1/2 a pack  Substance and Sexual Activity  . Alcohol use: No    Alcohol/week: 0.0 standard drinks  . Drug  use: No  . Sexual activity: Not Currently  Lifestyle  . Physical activity    Days per week: 0 days    Minutes per session: 0 min  . Stress: Only a little  Relationships  . Social Herbalist on phone: Not on file    Gets together: Not on file    Attends religious service: Not on file    Active member of club or organization: Not on file    Attends meetings of clubs or organizations: Not on file    Relationship status: Not on file  Other Topics Concern  . Not on file  Social History Narrative  . Not on file    Outpatient Encounter Medications as of 11/24/2018  Medication Sig  . aspirin 81 MG tablet Take 81 mg by mouth daily.    Marland Kitchen atorvastatin (LIPITOR) 10 MG tablet Take 1 tablet (10 mg total) by mouth daily.  . busPIRone (BUSPAR) 15 MG tablet Take 0.5 tablets (7.5 mg total) by mouth 2 (two) times daily.  . DULoxetine (CYMBALTA) 60 MG capsule Take 1 capsule (60 mg total) by mouth daily.  Marland Kitchen LYRICA 75 MG capsule TAKE 1 CAPSULE BY MOUTH TWICE A DAY  . Melatonin 10 MG CAPS Take 20 mg by mouth at bedtime.  . naproxen sodium (ALEVE) 220 MG tablet Take 440 mg by mouth daily as needed (headaches).  . traMADol (ULTRAM) 50 MG tablet TAKE 1 TABLET (50 MG TOTAL) BY MOUTH EVERY 8 (EIGHT) HOURS AS NEEDED.   No facility-administered encounter medications on file as of 11/24/2018.     Activities of Daily Living In your present state of health, do you have any difficulty performing the following activities: 11/24/2018 03/25/2018  Hearing? N N  Vision? N N  Difficulty concentrating or making decisions? N N  Walking or climbing stairs? Y N  Comment has some right knee pain -  Dressing or bathing? N N  Doing errands, shopping? N -  Preparing Food and eating ? N -  Using the Toilet? N -  In the past six months, have you accidently leaked urine? N -  Do you have problems with loss of bowel control? N -  Managing your Medications? N -  Managing your Finances? N -  Housekeeping or  managing your Housekeeping? N -  Some recent data might be hidden    Patient Care Team: Tower, [name], MD as PCP - General    Assessment:   This is a routine wellness examination for [place].  Exercise Activities and Dietary recommendations Current Exercise Habits: The patient does not participate in regular exercise at present, Exercise limited by: None identified  Goals    . Patient Stated  Starting 11/24/2017, I will continue to take medications as prescribed.     . Patient Stated     Starting 11/24/2018, Patient wants to improve her mental health and anxiety issues.        Fall Risk Fall Risk  11/24/2018 11/24/2017 09/12/2016 01/02/2015 05/31/2013  Falls in the past year? 0 No No No No  Number falls in past yr: 0 - - - -  Risk for fall due to : Medication side effect - - - -  Follow up Falls evaluation completed;Falls prevention discussed - - - -   Is the patient's home free of loose throw rugs in walkways, pet beds, electrical cords, etc?   yes      Grab bars in the bathroom? yes      Handrails on the stairs?   yes      Adequate lighting?   yes  Timed Get Up and Go performed: n/a  Depression Screen PHQ 2/9 Scores 11/24/2018 11/24/2017 09/12/2016 01/02/2015  PHQ - 2 Score 0 2 0 0  PHQ- 9 Score 0 8 - -     Cognitive Function MMSE - Mini Mental State Exam 11/24/2018 11/24/2017 09/12/2016  Orientation to time 5 5 5   Orientation to Place 5 5 5   Registration 3 3 3   Attention/ Calculation 0 0 0  Recall 3 3 3   Language- name 2 objects - 0 0  Language- repeat 1 1 1   Language- follow 3 step command - 3 3  Language- read & follow direction - 0 0  Write a sentence - 0 0  Copy design - 0 0  Total score - 20 20     Mini Cog  Mini-Cog screen was completed. Maximum score is 22. A value of 0 denotes this part of the MMSE was not completed or the patient failed this part of the Mini-Cog screening.    Immunization History  Administered Date(s) Administered  . Influenza Split  01/22/2011, 03/24/2012  . Influenza, High Dose Seasonal PF 11/07/2016  . Influenza,inj,Quad PF,6+ Mos 11/30/2012, 12/06/2014, 11/22/2015, 11/24/2017  . Pneumococcal Conjugate-13 01/02/2015  . Pneumococcal Polysaccharide-23 01/22/2011, 09/12/2016  . Td 05/01/1998, 03/04/2007    Qualifies for Shingles Vaccine? yes  Screening Tests Health Maintenance  Topic Date Due  . INFLUENZA VACCINE  10/02/2018  . TETANUS/TDAP  11/24/2019 (Originally 03/03/2017)  . MAMMOGRAM  12/03/2018  . Fecal DNA (Cologuard)  12/02/2019  . DEXA SCAN  Completed  . Hepatitis C Screening  Completed  . PNA vac Low Risk Adult  Completed    Cancer Screenings: Lung: Low Dose CT Chest recommended if Age 73-80 years, 30 pack-year currently smoking OR have quit w/in 15 years. Patient does qualify. Patient wants to discuss this with provider at upcoming physical.   Breast:  Up to date on Mammogram? Yes, completed 12/02/2017   Up to date of Bone Density/Dexa? Yes, completed 06/12/2016 Colorectal: Cologuard completed 12/01/2016  Additional Screenings: : Hepatitis C Screening: 11/24/2017     Plan:    Patient wants to improve on her anxiety and mental health issues.  I have personally reviewed and noted the following in the patient's chart:   . Medical and social history . Use of alcohol, tobacco or illicit drugs  . Current medications and supplements . Functional ability and status . Nutritional status . Physical activity . Advanced directives . List of other physicians . Hospitalizations, surgeries, and ER visits in previous 12 months . Vitals . Screenings to include cognitive, depression, and falls .  Referrals and appointments  In addition, I have reviewed and discussed with patient certain preventive protocols, quality metrics, and best practice recommendations. A written personalized care plan for preventive services as well as general preventive health recommendations were provided to patient.     Andrez Grime, LPN  03/30/1186

## 2018-11-24 NOTE — Telephone Encounter (Signed)
CPE scheduled 11/30/18, all meds were refilled on 11/24/17 with a years worth of refills given then, please advised  [facility]

## 2018-11-24 NOTE — Progress Notes (Signed)
PCP notes: none  Health Maintenance: Patient wants flu vaccine at upcoming physical. Patient declined Tdap and SHingrix. Patient wants to discuss having the Low Dose CT Lung Cancer Screening scan completed.    Abnormal Screenings: none    Patient concerns: Patient states that she currently takes Melatonin to help her sleep daily and it is not working. She would like to discuss other options to help with her insomnia.     Nurse concerns: none    Next PCP appt.: 11/30/2018 @ 10:45 AM

## 2018-11-24 NOTE — Patient Instructions (Signed)
[name] [name] , Thank you for taking time to come for your Medicare Wellness Visit. I appreciate your ongoing commitment to your health goals. Please review the following plan we discussed and let me know if I can assist you in the future.   Screening recommendations/referrals: Colonoscopy: up to date, Cologuard completed 12/01/2016 Mammogram: up to date, completed 12/02/2017 Bone Density: up to date, completed 06/12/2016 Recommended yearly ophthalmology/optometry visit for glaucoma screening and checkup Recommended yearly dental visit for hygiene and checkup  Vaccinations: Influenza vaccine: will get at next office visit  Pneumococcal vaccine: series completed Tdap vaccine: declined Shingles vaccine: declined    Advanced directives: Please bring a copy of your POA (Power of Attorney) and/or Living Will to your next appointment.   Conditions/risks identified: hyperlipidemia  Next appointment: 11/30/2018 @ 10:45 am    Preventive Care 69 Years and Older, Female Preventive care refers to lifestyle choices and visits with your health care provider that can promote health and wellness. What does preventive care include?  A yearly physical exam. This is also called an annual well check.  Dental exams once or twice a year.  Routine eye exams. Ask your health care provider how often you should have your eyes checked.  Personal lifestyle choices, including:  Daily care of your teeth and gums.  Regular physical activity.  Eating a healthy diet.  Avoiding tobacco and drug use.  Limiting alcohol use.  Practicing safe sex.  Taking low-dose aspirin every day.  Taking vitamin and mineral supplements as recommended by your health care provider. What happens during an annual well check? The services and screenings done by your health care provider during your annual well check will depend on your age, overall health, lifestyle risk factors, and family history of disease. Counseling   Your health care provider may ask you questions about your:  Alcohol use.  Tobacco use.  Drug use.  Emotional well-being.  Home and relationship well-being.  Sexual activity.  Eating habits.  History of falls.  Memory and ability to understand (cognition).  Work and work Statistician.  Reproductive health. Screening  You may have the following tests or measurements:  Height, weight, and BMI.  Blood pressure.  Lipid and cholesterol levels. These may be checked every 5 years, or more frequently if you are over 71 years old.  Skin check.  Lung cancer screening. You may have this screening every year starting at age 72 if you have a 30-pack-year history of smoking and currently smoke or have quit within the past 15 years.  Fecal occult blood test (FOBT) of the stool. You may have this test every year starting at age 62.  Flexible sigmoidoscopy or colonoscopy. You may have a sigmoidoscopy every 5 years or a colonoscopy every 10 years starting at age 48.  Hepatitis C blood test.  Hepatitis B blood test.  Sexually transmitted disease (STD) testing.  Diabetes screening. This is done by checking your blood sugar (glucose) after you have not eaten for a while (fasting). You may have this done every 1-3 years.  Bone density scan. This is done to screen for osteoporosis. You may have this done starting at age 75.  Mammogram. This may be done every 1-2 years. Talk to your health care provider about how often you should have regular mammograms. Talk with your health care provider about your test results, treatment options, and if necessary, the need for more tests. Vaccines  Your health care provider may recommend certain vaccines, such as:  Influenza  vaccine. This is recommended every year.  Tetanus, diphtheria, and acellular pertussis (Tdap, Td) vaccine. You may need a Td booster every 10 years.  Zoster vaccine. You may need this after age 53.  Pneumococcal 13-valent  conjugate (PCV13) vaccine. One dose is recommended after age 8.  Pneumococcal polysaccharide (PPSV23) vaccine. One dose is recommended after age 48. Talk to your health care provider about which screenings and vaccines you need and how often you need them. This information is not intended to replace advice given to you by your health care provider. Make sure you discuss any questions you have with your health care provider. Document Released: 03/16/2015 Document Revised: 11/07/2015 Document Reviewed: 12/19/2014 Elsevier Interactive Patient Education  2017 [place] Prevention in the Home Falls can cause injuries. They can happen to people of all ages. There are many things you can do to make your home safe and to help prevent falls. What can I do on the outside of my home?  Regularly fix the edges of walkways and driveways and fix any cracks.  Remove anything that might make you trip as you walk through a door, such as a raised step or threshold.  Trim any bushes or trees on the path to your home.  Use bright outdoor lighting.  Clear any walking paths of anything that might make someone trip, such as rocks or tools.  Regularly check to see if handrails are loose or broken. Make sure that both sides of any steps have handrails.  Any raised decks and porches should have guardrails on the edges.  Have any leaves, snow, or ice cleared regularly.  Use sand or salt on walking paths during winter.  Clean up any spills in your garage right away. This includes oil or grease spills. What can I do in the bathroom?  Use night lights.  Install grab bars by the toilet and in the tub and shower. Do not use towel bars as grab bars.  Use non-skid mats or decals in the tub or shower.  If you need to sit down in the shower, use a plastic, non-slip stool.  Keep the floor dry. Clean up any water that spills on the floor as soon as it happens.  Remove soap buildup in the tub or  shower regularly.  Attach bath mats securely with double-sided non-slip rug tape.  Do not have throw rugs and other things on the floor that can make you trip. What can I do in the bedroom?  Use night lights.  Make sure that you have a light by your bed that is easy to reach.  Do not use any sheets or blankets that are too big for your bed. They should not hang down onto the floor.  Have a firm chair that has side arms. You can use this for support while you get dressed.  Do not have throw rugs and other things on the floor that can make you trip. What can I do in the kitchen?  Clean up any spills right away.  Avoid walking on wet floors.  Keep items that you use a lot in easy-to-reach places.  If you need to reach something above you, use a strong step stool that has a grab bar.  Keep electrical cords out of the way.  Do not use floor polish or wax that makes floors slippery. If you must use wax, use non-skid floor wax.  Do not have throw rugs and other things on the floor that can  make you trip. What can I do with my stairs?  Do not leave any items on the stairs.  Make sure that there are handrails on both sides of the stairs and use them. Fix handrails that are broken or loose. Make sure that handrails are as long as the stairways.  Check any carpeting to make sure that it is firmly attached to the stairs. Fix any carpet that is loose or worn.  Avoid having throw rugs at the top or bottom of the stairs. If you do have throw rugs, attach them to the floor with carpet tape.  Make sure that you have a light switch at the top of the stairs and the bottom of the stairs. If you do not have them, ask someone to add them for you. What else can I do to help prevent falls?  Wear shoes that:  Do not have high heels.  Have rubber bottoms.  Are comfortable and fit you well.  Are closed at the toe. Do not wear sandals.  If you use a stepladder:  Make sure that it is fully  opened. Do not climb a closed stepladder.  Make sure that both sides of the stepladder are locked into place.  Ask someone to hold it for you, if possible.  Clearly mark and make sure that you can see:  Any grab bars or handrails.  First and last steps.  Where the edge of each step is.  Use tools that help you move around (mobility aids) if they are needed. These include:  Canes.  Walkers.  Scooters.  Crutches.  Turn on the lights when you go into a dark area. Replace any light bulbs as soon as they burn out.  Set up your furniture so you have a clear path. Avoid moving your furniture around.  If any of your floors are uneven, fix them.  If there are any pets around you, be aware of where they are.  Review your medicines with your doctor. Some medicines can make you feel dizzy. This can increase your chance of falling. Ask your doctor what other things that you can do to help prevent falls. This information is not intended to replace advice given to you by your health care provider. Make sure you discuss any questions you have with your health care provider. Document Released: 12/14/2008 Document Revised: 07/26/2015 Document Reviewed: 03/24/2014 Elsevier Interactive Patient Education  2017 [employer].

## 2018-11-30 ENCOUNTER — Encounter: Payer: Self-pay | Admitting: Family Medicine

## 2018-11-30 ENCOUNTER — Other Ambulatory Visit: Payer: Self-pay | Admitting: Family Medicine

## 2018-11-30 ENCOUNTER — Other Ambulatory Visit: Payer: Self-pay

## 2018-11-30 ENCOUNTER — Ambulatory Visit: Payer: PPO

## 2018-11-30 ENCOUNTER — Ambulatory Visit (INDEPENDENT_AMBULATORY_CARE_PROVIDER_SITE_OTHER): Payer: PPO | Admitting: Family Medicine

## 2018-11-30 VITALS — BP 118/68 | HR 72 | Temp 97.8°F | Ht 64.75 in | Wt 151.5 lb

## 2018-11-30 DIAGNOSIS — M8589 Other specified disorders of bone density and structure, multiple sites: Secondary | ICD-10-CM

## 2018-11-30 DIAGNOSIS — Z0000 Encounter for general adult medical examination without abnormal findings: Secondary | ICD-10-CM | POA: Diagnosis not present

## 2018-11-30 DIAGNOSIS — F17200 Nicotine dependence, unspecified, uncomplicated: Secondary | ICD-10-CM | POA: Diagnosis not present

## 2018-11-30 DIAGNOSIS — M159 Polyosteoarthritis, unspecified: Secondary | ICD-10-CM

## 2018-11-30 DIAGNOSIS — F4321 Adjustment disorder with depressed mood: Secondary | ICD-10-CM | POA: Diagnosis not present

## 2018-11-30 DIAGNOSIS — E2839 Other primary ovarian failure: Secondary | ICD-10-CM

## 2018-11-30 DIAGNOSIS — E785 Hyperlipidemia, unspecified: Secondary | ICD-10-CM

## 2018-11-30 DIAGNOSIS — Z23 Encounter for immunization: Secondary | ICD-10-CM

## 2018-11-30 DIAGNOSIS — M797 Fibromyalgia: Secondary | ICD-10-CM | POA: Diagnosis not present

## 2018-11-30 DIAGNOSIS — R7989 Other specified abnormal findings of blood chemistry: Secondary | ICD-10-CM | POA: Insufficient documentation

## 2018-11-30 LAB — COMPREHENSIVE METABOLIC PANEL
ALT: 8 U/L (ref 0–35)
AST: 11 U/L (ref 0–37)
Albumin: 4.2 g/dL (ref 3.5–5.2)
Alkaline Phosphatase: 85 U/L (ref 39–117)
BUN: 7 mg/dL (ref 6–23)
CO2: 27 mEq/L (ref 19–32)
Calcium: 10.1 mg/dL (ref 8.4–10.5)
Chloride: 100 mEq/L (ref 96–112)
Creatinine, Ser: 0.66 mg/dL (ref 0.40–1.20)
GFR: 88.65 mL/min (ref >60.00)
Glucose, Bld: 106 mg/dL — ABNORMAL HIGH (ref 70–99)
Potassium: 5.3 mEq/L — ABNORMAL HIGH (ref 3.5–5.1)
Sodium: 134 mEq/L — ABNORMAL LOW (ref 135–145)
Total Bilirubin: 0.5 mg/dL (ref 0.2–1.2)
Total Protein: 6.6 g/dL (ref 6.0–8.3)

## 2018-11-30 LAB — CBC WITH DIFFERENTIAL/PLATELET
Basophils Absolute: 0 10*3/uL (ref 0.0–0.1)
Basophils Relative: 0.7 % (ref 0.0–3.0)
Eosinophils Absolute: 0.2 10*3/uL (ref 0.0–0.7)
Eosinophils Relative: 4.2 % (ref 0.0–5.0)
HCT: 40.4 % (ref 36.0–46.0)
Hemoglobin: 13.9 g/dL (ref 12.0–15.0)
Lymphocytes Relative: 41.5 % (ref 12.0–46.0)
Lymphs Abs: 2.3 10*3/uL (ref 0.7–4.0)
MCHC: 34.4 g/dL (ref 30.0–36.0)
MCV: 90.1 fl (ref 78.0–100.0)
Monocytes Absolute: 0.5 10*3/uL (ref 0.1–1.0)
Monocytes Relative: 8.6 % (ref 3.0–12.0)
Neutro Abs: 2.5 10*3/uL (ref 1.4–7.7)
Neutrophils Relative %: 45 % (ref 43.0–77.0)
Platelets: 272 10*3/uL (ref 150.0–400.0)
RBC: 4.49 Mil/uL (ref 3.87–5.11)
RDW: 14.1 % (ref 11.5–15.5)
WBC: 5.5 10*3/uL (ref 4.0–10.5)

## 2018-11-30 LAB — LIPID PANEL
Cholesterol: 178 mg/dL (ref 0–200)
HDL: 85 mg/dL (ref >39.00)
LDL Cholesterol: 83 mg/dL (ref 0–99)
NonHDL: 92.78
Total CHOL/HDL Ratio: 2
Triglycerides: 50 mg/dL (ref 0.0–149.0)
VLDL: 10 mg/dL (ref 0.0–40.0)

## 2018-11-30 LAB — TSH: TSH: 4.72 u[IU]/mL — ABNORMAL HIGH (ref 0.35–4.50)

## 2018-11-30 MED ORDER — BUSPIRONE HCL 15 MG PO TABS: 7.5000 mg | ORAL_TABLET | Freq: Two times a day (BID) | ORAL | 3 refills | Status: DC

## 2018-11-30 MED ORDER — PREGABALIN 75 MG PO CAPS: 75.0000 mg | ORAL_CAPSULE | Freq: Two times a day (BID) | ORAL | 1 refills | Status: DC

## 2018-11-30 MED ORDER — DULOXETINE HCL 60 MG PO CPEP: 60.0000 mg | ORAL_CAPSULE | Freq: Every day | ORAL | 3 refills | Status: DC

## 2018-11-30 MED ORDER — ATORVASTATIN CALCIUM 10 MG PO TABS: 10.0000 mg | ORAL_TABLET | Freq: Every day | ORAL | 3 refills | Status: DC

## 2018-11-30 NOTE — Assessment & Plan Note (Signed)
Disc in detail risks of smoking and possible outcomes including copd, vascular/ heart disease, cancer , respiratory and sinus infections  Pt voices understanding  

## 2018-11-30 NOTE — Assessment & Plan Note (Signed)
In a smoker  No falls or fractures  Taking D and ca  Does own yard work/active  Encouraged walking for exercise  Can do next dexa any time

## 2018-11-30 NOTE — Assessment & Plan Note (Signed)
Good and bad days-doing better with time  Continues buspar and cymbalta

## 2018-11-30 NOTE — Assessment & Plan Note (Signed)
Continues current medicines  Doing fairly well lately  OA of hands and knee is bothering her more lately -will watch this  No indication of autoimmune joint dz

## 2018-11-30 NOTE — Assessment & Plan Note (Signed)
Reviewed health habits including diet and exercise and skin cancer prevention Reviewed appropriate screening tests for age  Also reviewed health mt list, fam hx and immunization status , as well as social and family history   See HPI Labs ordered amw rev  Flu shot given  Pt declines shingrix  Interested in lung cancer screening-brochure given and will update [place] if she wants to proceed  Counseled on smoking cessation  Pt will schedule her mammogram for October and continue self breast exams Declines colon cancer screening

## 2018-11-30 NOTE — Assessment & Plan Note (Signed)
Continues to be bothersome

## 2018-11-30 NOTE — Telephone Encounter (Signed)
Name of Medication: Tramadol Name of [facility]acy: [facility] or Written Date and Quantity: 06/02/18 #90 tabs with 3 refills  Last Office Visit and Type: CPE Today (11/30/18) Next Office Visit and Type: none scheduled  Last Controlled Substance Agreement Date:01/02/15 Last UDS:01/02/15

## 2018-11-30 NOTE — Assessment & Plan Note (Signed)
Lipid panel today  Last LDL 123 Smoker  Taking atorvastatin  Disc goals for lipids and reasons to control them Rev last labs with pt Rev low sat fat diet in detail

## 2018-11-30 NOTE — Progress Notes (Signed)
Subjective:    Patient ID: [name] [name], female    DOB: [birth year], 68 y.o.   MRN: [id number]  HPI Here for health maintenance exam and to review chronic medical problems   Being very careful with covid-not getting out   Feeling ok overall   Had amw visit 9/23  Flu shot given today high dose   Declines Tdap and shingrix   She is interested in the low dose CT lung cancer screening program   Smoking status -smokes about 1/2 ppd  Not interested in quitting  No symptoms of copd   Mammogram 10/19 - goes to [facility]  Self breast exam - no lumps   cologuard done 10/18- could not get processed Declines colonosocpy or other screening   dexa 4/18 osteopenia Falls- none  Fractures -none  Supplements -takes vit D and ca  Exercise - is active /no extra exercise  Does her own yard work   [employer] from Last 3 Encounters:  11/30/18 151 lb 8 oz (68.7 kg)  04/22/18 130 lb (59 kg)  03/25/18 138 lb (62.6 kg)  eats healthy for the most part  Loves peanut butter  25.41 kg/m   BP Readings from Last 3 Encounters:  11/30/18 118/68  04/22/18 134/76  03/25/18 135/71   Pulse Readings from Last 3 Encounters:  11/30/18 72  04/22/18 67  03/25/18 (!) 57    Hyperlipidemia Lab Results  Component Value Date   CHOL 224 (H) 04/28/2016   HDL 89.80 04/28/2016   LDLCALC 123 (H) 04/28/2016   LDLDIRECT 127.3 01/22/2011   TRIG 55.0 04/28/2016   CHOLHDL 2 04/28/2016   Atorvastatin and diet  No red meat or fried foods   Myofascial pain syndrome  Doing pretty good overall  Has OA in hands and R knee   Mood -ups and down  Good and bad days   Patient Active Problem List   Diagnosis Date Noted  . Grief reaction 11/24/2017  . Multiple joint pain 09/16/2016  . Osteopenia 06/15/2016  . Screening mammogram, encounter for 04/28/2016  . Medicare annual wellness visit, initial 01/02/2015  . Colon cancer screening 05/31/2013  . Retinopathy, bilateral 10/05/2012  . Hyperlipidemia, mild  10/05/2012  . Adverse effects of medication 03/24/2012  . Routine general medical examination at a health care facility 01/22/2011  . Generalized osteoarthritis of hand 07/06/2009  . SLEEP DISORDER 01/18/2007  . POSTHERPETIC NEURALGIA 12/15/2006  . TOBACCO ABUSE 12/15/2006  . CARPAL TUNNEL SYNDROME 12/15/2006  . Osteoarthritis 12/15/2006  . Fibromyalgia 12/15/2006   Past Medical History:  Diagnosis Date  . Arthritis    OA  . Carpal tunnel syndrome   . Depression   . Endometriosis   . Fibromyalgia   . Post herpetic neuralgia   . Sleep disorder   . Tobacco abuse    Past Surgical History:  Procedure Laterality Date  . ABDOMINAL HYSTERECTOMY  1992   total endometriosis  . APPENDECTOMY  1992  . bladder tack    . CATARACT EXTRACTION W/PHACO Right 03/25/2018   Procedure: CATARACT EXTRACTION PHACO AND INTRAOCULAR LENS PLACEMENT ([place]) RIGHT;  Surgeon: Marchia Meiers, MD;  Location: [facility] ORS;  Service: Ophthalmology;  Laterality: Right;  [place]  01:11 CDE 8.02 Fluid pack lot # [id number]  . CATARACT EXTRACTION W/PHACO Left 04/22/2018   Procedure: CATARACT EXTRACTION PHACO AND INTRAOCULAR LENS PLACEMENT ([place]) Left Eye;  Surgeon: Marchia Meiers, MD;  Location: [facility] ORS;  Service: Ophthalmology;  Laterality: Left;  [place]  01:02 CDE 8.11 Fluid pack  lot # [id number]  . FOOT SURGERY Right 04/2016   [facility] in [place] History   Tobacco Use  . Smoking status: Current Every Day Smoker    Packs/day: 0.30  . Smokeless tobacco: Never Used  . Tobacco comment: less than 1/2 a pack  Substance Use Topics  . Alcohol use: No    Alcohol/week: 0.0 standard drinks  . Drug use: No   Family History  Problem Relation Age of Onset  . Heart disease Mother        MI  . Heart disease Father        MI  . Arthritis Sister        crippling arthritis  . Arthritis Brother        crippling arthritis   Allergies  Allergen Reactions  . Acetaminophen Other (See Comments)    GI upset    Current Outpatient Medications on File Prior to Visit  Medication Sig Dispense Refill  . aspirin 81 MG tablet Take 81 mg by mouth daily.      . Melatonin 10 MG CAPS Take 20 mg by mouth at bedtime.    . naproxen sodium (ALEVE) 220 MG tablet Take 440 mg by mouth daily as needed (headaches).     No current facility-administered medications on file prior to visit.      Review of Systems  Constitutional: Negative for activity change, appetite change, fatigue, fever and unexpected weight change.  HENT: Negative for congestion, ear pain, rhinorrhea, sinus pressure and sore throat.   Eyes: Negative for pain, redness and visual disturbance.  Respiratory: Negative for cough, shortness of breath and wheezing.   Cardiovascular: Negative for chest pain and palpitations.  Gastrointestinal: Negative for abdominal pain, blood in stool, constipation and diarrhea.  Endocrine: Negative for polydipsia and polyuria.  Genitourinary: Negative for dysuria, frequency and urgency.  Musculoskeletal: Positive for arthralgias and back pain. Negative for joint swelling and myalgias.  Skin: Negative for pallor and rash.  Allergic/Immunologic: Negative for environmental allergies.  Neurological: Negative for dizziness, syncope and headaches.  Hematological: Negative for adenopathy. Does not bruise/bleed easily.  Psychiatric/Behavioral: Negative for decreased concentration and dysphoric mood. The patient is nervous/anxious.        Objective:   Physical Exam Constitutional:      General: She is not in acute distress.    Appearance: Normal appearance. She is well-developed and normal weight. She is not ill-appearing or diaphoretic.  HENT:     Head: Normocephalic and atraumatic.     Right Ear: Tympanic membrane, ear canal and external ear normal.     Left Ear: Tympanic membrane, ear canal and external ear normal.     Nose: Nose normal. No congestion.     Mouth/Throat:     Mouth: Mucous membranes are moist.      Pharynx: Oropharynx is clear. No posterior oropharyngeal erythema.  Eyes:     General: No scleral icterus.    Extraocular Movements: Extraocular movements intact.     Conjunctiva/sclera: Conjunctivae normal.     Pupils: Pupils are equal, round, and reactive to light.  Neck:     Musculoskeletal: Normal range of motion and neck supple. No neck rigidity or muscular tenderness.     Thyroid: No thyromegaly.     Vascular: No carotid bruit or JVD.  Cardiovascular:     Rate and Rhythm: Normal rate and regular rhythm.     Pulses: Normal pulses.     Heart sounds: Normal heart sounds. No  gallop.   Pulmonary:     Effort: Pulmonary effort is normal. No respiratory distress.     Breath sounds: Normal breath sounds. No wheezing.     Comments: Diffusely distant bs  Chest:     Chest wall: No tenderness.  Abdominal:     General: Bowel sounds are normal. There is no distension or abdominal bruit.     Palpations: Abdomen is soft. There is no mass.     Tenderness: There is no abdominal tenderness.     Hernia: No hernia is present.  Genitourinary:    Comments: Breast exam: No mass, nodules, thickening, tenderness, bulging, retraction, inflamation, nipple discharge or skin changes noted.  No axillary or clavicular LA.     Musculoskeletal: Normal range of motion.        General: No tenderness.     Right lower leg: No edema.     Left lower leg: No edema.     Comments: Joint changes in distal pip of hands resembling OA  Lymphadenopathy:     Cervical: No cervical adenopathy.  Skin:    General: Skin is warm and dry.     Coloration: Skin is not pale.     Findings: No erythema or rash.     Comments: sks and lentigines diffusely  Neurological:     Mental Status: She is alert. Mental status is at baseline.     Cranial Nerves: No cranial nerve deficit.     Motor: No abnormal muscle tone.     Coordination: Coordination normal.     Gait: Gait normal.     Deep Tendon Reflexes: Reflexes are normal and  symmetric. Reflexes normal.  Psychiatric:        Mood and Affect: Mood normal.        Cognition and Memory: Cognition and memory normal.           Assessment & Plan:   Problem List Items Addressed This Visit      Musculoskeletal and Integument   Generalized osteoarthritis of hand    Continues to be bothersome        Osteopenia    In a smoker  No falls or fractures  Taking D and ca  Does own yard work/active  Encouraged walking for exercise  Can do next dexa any time        Other   TOBACCO ABUSE    Disc in detail risks of smoking and possible outcomes including copd, vascular/ heart disease, cancer , respiratory and sinus infections  Pt voices understanding       Fibromyalgia    Continues current medicines  Doing fairly well lately  OA of hands and knee is bothering her more lately -will watch this  No indication of autoimmune joint dz      Relevant Medications   pregabalin (LYRICA) 75 MG capsule   DULoxetine (CYMBALTA) 60 MG capsule   Routine general medical examination at a health care facility - Primary    Reviewed health habits including diet and exercise and skin cancer prevention Reviewed appropriate screening tests for age  Also reviewed health mt list, fam hx and immunization status , as well as social and family history   See HPI Labs ordered amw rev  Flu shot given  Pt declines shingrix  Interested in lung cancer screening-brochure given and will update [place] if she wants to proceed  Counseled on smoking cessation  Pt will schedule her mammogram for October and continue self breast exams Declines colon cancer  screening       Relevant Orders   CBC with Differential/Platelet   Comprehensive metabolic panel   TSH   Hyperlipidemia, mild    Lipid panel today  Last LDL 123 Smoker  Taking atorvastatin  Disc goals for lipids and reasons to control them Rev last labs with pt Rev low sat fat diet in detail       Relevant Medications    atorvastatin (LIPITOR) 10 MG tablet   Other Relevant Orders   Comprehensive metabolic panel   Lipid panel   Grief reaction    Good and bad days-doing better with time  Continues buspar and cymbalta      RESOLVED: Estrogen deficiency    Other Visit Diagnoses    Need for influenza vaccination       Relevant Orders   Flu Vaccine QUAD High Dose(Fluad) (Completed)

## 2018-11-30 NOTE — Patient Instructions (Addendum)
Take a look at the lung cancer screening program and let us know if you are interested   Don't forget to schedule your mammogram at [facility] today   Flu shot today

## 2018-12-02 DIAGNOSIS — D2310 Other benign neoplasm of skin of unspecified eyelid, including canthus: Secondary | ICD-10-CM | POA: Diagnosis not present

## 2018-12-30 ENCOUNTER — Telehealth: Payer: Self-pay | Admitting: Family Medicine

## 2018-12-30 DIAGNOSIS — R7989 Other specified abnormal findings of blood chemistry: Secondary | ICD-10-CM

## 2018-12-30 DIAGNOSIS — E875 Hyperkalemia: Secondary | ICD-10-CM

## 2018-12-30 NOTE — Telephone Encounter (Signed)
-----   Message from Cloyd Stagers, RT sent at 12/20/2018  1:54 PM EDT ----- Regarding: Lab Orders for Friday 10.30.2020 Please place lab orders for Friday 10.30.2020, appt notes state "TSH, free T4, BMET" Thank you, Dyke Maes RT(R)

## 2018-12-31 ENCOUNTER — Other Ambulatory Visit (INDEPENDENT_AMBULATORY_CARE_PROVIDER_SITE_OTHER): Payer: PPO

## 2018-12-31 DIAGNOSIS — E875 Hyperkalemia: Secondary | ICD-10-CM

## 2018-12-31 DIAGNOSIS — R7989 Other specified abnormal findings of blood chemistry: Secondary | ICD-10-CM

## 2018-12-31 LAB — TSH: TSH: 4.37 u[IU]/mL (ref 0.35–4.50)

## 2018-12-31 LAB — BASIC METABOLIC PANEL
BUN: 9 mg/dL (ref 6–23)
CO2: 25 mEq/L (ref 19–32)
Calcium: 9.5 mg/dL (ref 8.4–10.5)
Chloride: 100 mEq/L (ref 96–112)
Creatinine, Ser: 0.69 mg/dL (ref 0.40–1.20)
GFR: 84.19 mL/min (ref >60.00)
Glucose, Bld: 118 mg/dL — ABNORMAL HIGH (ref 70–99)
Potassium: 4.4 mEq/L (ref 3.5–5.1)
Sodium: 132 mEq/L — ABNORMAL LOW (ref 135–145)

## 2018-12-31 LAB — T4, FREE: Free T4: 0.73 ng/dL (ref 0.60–1.60)

## 2019-01-02 ENCOUNTER — Encounter: Payer: Self-pay | Admitting: Family Medicine

## 2019-01-02 DIAGNOSIS — R7309 Other abnormal glucose: Secondary | ICD-10-CM | POA: Insufficient documentation

## 2019-01-03 ENCOUNTER — Encounter: Payer: Self-pay | Admitting: *Deleted

## 2019-04-05 ENCOUNTER — Encounter: Payer: Self-pay | Admitting: Family Medicine

## 2019-04-05 ENCOUNTER — Ambulatory Visit (INDEPENDENT_AMBULATORY_CARE_PROVIDER_SITE_OTHER): Payer: Medicare Other | Admitting: Family Medicine

## 2019-04-05 ENCOUNTER — Other Ambulatory Visit: Payer: Self-pay

## 2019-04-05 VITALS — BP 128/80 | HR 77 | Temp 97.4°F | Ht 64.75 in | Wt 154.4 lb

## 2019-04-05 DIAGNOSIS — F17200 Nicotine dependence, unspecified, uncomplicated: Secondary | ICD-10-CM

## 2019-04-05 DIAGNOSIS — L723 Sebaceous cyst: Secondary | ICD-10-CM

## 2019-04-05 NOTE — Patient Instructions (Addendum)
I placed a referral to dermatology for your cyst  Keep it clean If it turns red or very painful please let us know   The office will call you about that appointment   Please think about quitting smoking   Get your covid vaccine when you can     We are committed to keeping you informed about the COVID-19 vaccine.  As the vaccine continues to become available for each phase, we will ensure that patients who meet the criteria receive the information they need to access vaccination opportunities. Continue to check your MyChart account and [email or web address] for updates. Please review the Phase 1b information below.  [place] On Tuesday, Jan. 19, the [facility]) and [place] began large-scale COVID-19 vaccinations at the [facility]. The vaccinations are appointment only and for those 25 and older.  Walk-ins will not be accepted.  All appointments are currently filled. Please join our waiting list for the next available appointments. We will contact you when appointments become available. Please do not sign up more than once.  Join Our Waiting List  Our daily vaccination capacity will continue to increase, including expansion of our [employer] site, increasing mobile clinics across our service region and plans to open COVID-19 vaccination clinics in [place] and [place] counties in February.  On Friday, Jan. 22, we announced the need to reschedule 10,400 vaccination appointments because of the state's decision not to allocate [facility] with COVID-19 vaccines for the week of Jan. 25. Our announcement of this news is available here.  Going forward, we will schedule vaccination appointments weekly based on vaccine allocations provided by the state.  On Friday, Jan. 29, the [place] announced that [employer] and  [place] will again be receiving vaccines. Our announcement of this news is available here.  We will first schedule vaccination appointments for those whose appointments were cancelled last week, then schedule those on the [place] waiting list in the order in which they registered. Those who had their appointment cancelled should expect notification by email (or by phone for those without email) at least 48 hours prior to their rescheduled appointment.  Other Vaccination Opportunities in [place] We are also working in partnership with county health agencies in our service counties to ensure continuing vaccination availability in the weeks and months ahead. Learn more about each county's vaccination efforts in the website links below:   [place]'s phase 1b vaccination guidelines, prioritizing those 65 and over as the next eligible group to receive the COVID-19 vaccine, are detailed at [email or web address].   Vaccine Safety and Effectiveness Clinical trials for the Pfizer COVID-19 vaccine involved 42,000 people and showed that the vaccine is more than 95% effective in preventing COVID-19 with no serious safety concerns. Similar results have been reported for the Moderna COVID-19 vaccine. Side effects reported in the [place] clinical trials include a sore arm at the injection site, fatigue, headache, chills and fever. While side effects from the [place] COVID-19 vaccine are higher than for a typical flu vaccine, they are lower in many ways than side effects from the leading vaccine to prevent shingles. Side effects are signs that a vaccine is working and are related to your immune system being stimulated to produce antibodies against infection. Side effects from vaccination are far  less significant than health impacts from COVID-19.  Staying Informed Pharmacists, infectious disease doctors, critical  care nurses and other experts at [facility] continue to speak publicly through media interviews and direct communication with our patients and communities about the safety, effectiveness and importance of vaccines to eliminate COVID-19. In addition, reliable information on vaccine safety, effectiveness, side effects and more is available on the following websites:  [place] Department of Health and Human Services COVID-19 Vaccine Information Website.  U.S. Centers for Disease Control and Prevention DLOPR-67 Human resources officer.  Staying Safe We agree with the CDC on what we can do to help our communities get back to normal: Getting "back to normal" is going to take all of our tools. If we use all the tools we have, we stand the best chance of getting our families, communities, schools and workplaces "back to normal" sooner:  Get vaccinated as soon as vaccines become available within the phase of the state's vaccination rollout plan for which you meet the eligibility criteria.  Wear a mask.  Stay 6 feet from others and avoid crowds.  Wash hands often.  For our most current information, please visit [email or web address].

## 2019-04-05 NOTE — Assessment & Plan Note (Signed)
seb cyst posterior middle neck   1 to 1 1/2 cm in diameter Not infected  Pt desires derm referral for removal since it is bothersome Ref done inst to keep clean  Also report back if red/swelling/pain or other signs of infection at any time

## 2019-04-05 NOTE — Assessment & Plan Note (Signed)
Disc in detail risks of smoking and possible outcomes including copd, vascular/ heart disease, cancer , respiratory and sinus infections  Pt voices understanding Pt is not interested in quitting at this time 

## 2019-04-05 NOTE — Progress Notes (Signed)
Subjective:    Patient ID: [name] [name], female    DOB: 1950/06/30, 69 y.o.   MRN: [id number]  This visit occurred during the SARS-CoV-2 public health emergency.  Safety protocols were in place, including screening questions prior to the visit, additional usage of staff PPE, and extensive cleaning of exam room while observing appropriate contact time as indicated for disinfecting solutions.    HPI  Pt presents with skin lesion on the back of her neck   Wanted to know also if I recommend the covid vaccine   Wt Readings from Last 3 Encounters:  04/05/19 154 lb 7 oz (70.1 kg)  11/30/18 151 lb 8 oz (68.7 kg)  04/22/18 130 lb (59 kg)   25.90 kg/m   Knot on the back of her neck  Started as a mole  Now getting bigger  No pain  It does itch  No drainage   No h/o skin cancers   BP Readings from Last 3 Encounters:  04/05/19 128/80  11/30/18 118/68  04/22/18 134/76   Still smoking (less than 1/2 ppd) No desire to quit Understands risks   Patient Active Problem List   Diagnosis Date Noted  . Elevated glucose level 01/02/2019  . Elevated TSH 11/30/2018  . Grief reaction 11/24/2017  . Multiple joint pain 09/16/2016  . Osteopenia 06/15/2016  . Screening mammogram, encounter for 04/28/2016  . Medicare annual wellness visit, initial 01/02/2015  . Colon cancer screening 05/31/2013  . Hyperkalemia 11/30/2012  . Retinopathy, bilateral 10/05/2012  . Hyperlipidemia, mild 10/05/2012  . Adverse effects of medication 03/24/2012  . Routine general medical examination at a health care facility 01/22/2011  . Generalized osteoarthritis of hand 07/06/2009  . SEBACEOUS CYST, NECK 07/13/2007  . SLEEP DISORDER 01/18/2007  . POSTHERPETIC NEURALGIA 12/15/2006  . TOBACCO ABUSE 12/15/2006  . CARPAL TUNNEL SYNDROME 12/15/2006  . Osteoarthritis 12/15/2006  . Fibromyalgia 12/15/2006   Past Medical History:  Diagnosis Date  . Arthritis    OA  . Carpal tunnel syndrome   . Depression     . Endometriosis   . Fibromyalgia   . Post herpetic neuralgia   . Sleep disorder   . Tobacco abuse    Past Surgical History:  Procedure Laterality Date  . ABDOMINAL HYSTERECTOMY  1992   total endometriosis  . APPENDECTOMY  1992  . bladder tack    . CATARACT EXTRACTION W/PHACO Right 03/25/2018   Procedure: CATARACT EXTRACTION PHACO AND INTRAOCULAR LENS PLACEMENT ([place]) RIGHT;  Surgeon: Marchia Meiers, MD;  Location: [facility] ORS;  Service: Ophthalmology;  Laterality: Right;  [place]  01:11 CDE 8.02 Fluid pack lot # [id number]  . CATARACT EXTRACTION W/PHACO Left 04/22/2018   Procedure: CATARACT EXTRACTION PHACO AND INTRAOCULAR LENS PLACEMENT ([place]) Left Eye;  Surgeon: Marchia Meiers, MD;  Location: [facility] ORS;  Service: Ophthalmology;  Laterality: Left;  [place]  01:02 CDE 8.11 Fluid pack lot # [id number]  . FOOT SURGERY Right 04/2016   [facility] in [place] History   Tobacco Use  . Smoking status: Current Every Day Smoker    Packs/day: 0.30  . Smokeless tobacco: Never Used  . Tobacco comment: less than 1/2 a pack  Substance Use Topics  . Alcohol use: No    Alcohol/week: 0.0 standard drinks  . Drug use: No   Family History  Problem Relation Age of Onset  . Heart disease Mother        MI  . Heart disease Father  MI  . Arthritis Sister        crippling arthritis  . Arthritis Brother        crippling arthritis   Allergies  Allergen Reactions  . Acetaminophen Other (See Comments)    GI upset   Current Outpatient Medications on File Prior to Visit  Medication Sig Dispense Refill  . aspirin 81 MG tablet Take 81 mg by mouth daily.      Marland Kitchen atorvastatin (LIPITOR) 10 MG tablet Take 1 tablet (10 mg total) by mouth daily. 90 tablet 3  . busPIRone (BUSPAR) 15 MG tablet Take 0.5 tablets (7.5 mg total) by mouth 2 (two) times daily. 90 tablet 3  . DULoxetine (CYMBALTA) 60 MG capsule Take 1 capsule (60 mg total) by mouth daily. 90 capsule 3  . Melatonin 10 MG CAPS Take 20 mg by  mouth at bedtime.    . naproxen sodium (ALEVE) 220 MG tablet Take 440 mg by mouth daily as needed (headaches).    . pregabalin (LYRICA) 75 MG capsule Take 1 capsule (75 mg total) by mouth 2 (two) times daily. 180 capsule 1  . traMADol (ULTRAM) 50 MG tablet TAKE 1 TABLET (50 MG TOTAL) BY MOUTH EVERY 8 (EIGHT) HOURS AS NEEDED. 90 tablet 3   No current facility-administered medications on file prior to visit.     Review of Systems  Constitutional: Negative for activity change, appetite change, fatigue, fever and unexpected weight change.  HENT: Negative for congestion, ear pain, rhinorrhea, sinus pressure and sore throat.   Eyes: Negative for pain, redness and visual disturbance.  Respiratory: Negative for cough, shortness of breath and wheezing.   Cardiovascular: Negative for chest pain and palpitations.  Gastrointestinal: Negative for abdominal pain, blood in stool, constipation and diarrhea.  Endocrine: Negative for polydipsia and polyuria.  Genitourinary: Negative for dysuria, frequency and urgency.  Musculoskeletal: Positive for arthralgias and myalgias. Negative for back pain.  Skin: Negative for pallor and rash.       Bump on neck  Allergic/Immunologic: Negative for environmental allergies.  Neurological: Negative for dizziness, syncope and headaches.  Hematological: Negative for adenopathy. Does not bruise/bleed easily.  Psychiatric/Behavioral: Negative for decreased concentration and dysphoric mood. The patient is not nervous/anxious.        Objective:   Physical Exam Constitutional:      Appearance: Normal appearance. She is normal weight. She is not ill-appearing.  HENT:     Head: Normocephalic and atraumatic.  Eyes:     Conjunctiva/sclera: Conjunctivae normal.     Pupils: Pupils are equal, round, and reactive to light.  Cardiovascular:     Rate and Rhythm: Normal rate and regular rhythm.     Pulses: Normal pulses.  Pulmonary:     Effort: Pulmonary effort is normal. No  respiratory distress.     Breath sounds: Normal breath sounds.     Comments: Diffusely distant bs  Skin:    General: Skin is warm and dry.     Coloration: Skin is not pale.     Findings: No erythema or rash.     Comments: 1-1.5 cm oval sebaceous cyst noted on back of neck (midline) with small pore in center (unable to express material)  No color change or warmth or tenderness Not fluctuant   Multiple skin tags on neck   Neurological:     Mental Status: She is alert.     Cranial Nerves: No cranial nerve deficit.  Psychiatric:        Mood and Affect:  Mood normal.     Comments: Pleasant            Assessment & Plan:   Problem List Items Addressed This Visit      Musculoskeletal and Integument   SEBACEOUS CYST, NECK - Primary    seb cyst posterior middle neck   1 to 1 1/2 cm in diameter Not infected  Pt desires derm referral for removal since it is bothersome Ref done inst to keep clean  Also report back if red/swelling/pain or other signs of infection at any time      Relevant Orders   Ambulatory referral to Dermatology     Other   TOBACCO ABUSE    Disc in detail risks of smoking and possible outcomes including copd, vascular/ heart disease, cancer , respiratory and sinus infections  Pt voices understanding Pt is not interested in quitting at this time

## 2019-05-24 ENCOUNTER — Other Ambulatory Visit: Payer: Self-pay

## 2019-05-24 ENCOUNTER — Ambulatory Visit: Payer: Medicare Other | Admitting: Dermatology

## 2019-05-24 DIAGNOSIS — L720 Epidermal cyst: Secondary | ICD-10-CM

## 2019-05-24 NOTE — Progress Notes (Signed)
   New Patient Visit  Subjective  [name] [name] is a 69 y.o. female who presents for the following: Growth (Posterior neck, present for years, growing larger.).    Objective  Well appearing patient in no apparent distress; mood and affect are within normal limits.  A focused examination was performed including posterior neck. Relevant physical exam findings are noted in the Assessment and Plan.  Objective  Posterior Neck: 1.2 cm Subcutaneous nodule with punctum.   Assessment & Plan  Epidermal inclusion cyst Posterior Neck  Discussed excision, including resulting scar.  Pre-op info given.  Recommend pt d/c aspirin 10 days prior to procedure. Pt will schedule.  Return for Surgery - cyst, posterior neck.   IJamesetta Orleans, CMA, am acting as scribe for Brendolyn Patty, MD .

## 2019-05-24 NOTE — Patient Instructions (Addendum)
Pre-Operative Instructions  You are scheduled for a surgical procedure at [facility]. We recommend you read the following instructions. If you have any questions or concerns, please call the office at [phone].  1. Shower and wash the entire body with soap and water the day of your surgery paying special attention to cleansing at and around the planned surgery site.  2. Avoid aspirin or aspirin containing products at least fourteen (14) days prior to your surgical procedure and for at least one week (7 Days) after your surgical procedure. If you take aspirin on a regular basis for heart disease or history of stroke or for any other reason, we may recommend you continue taking aspirin but please notify us if you take this on a regular basis. Aspirin can cause more bleeding to occur during surgery as well as prolonged bleeding and bruising after surgery.   3. Avoid other nonsteroidal pain medications at least one week prior to surgery and at least one week prior to your surgery. These include medications such as Ibuprofen (Motrin, Advil and Nuprin), Naprosyn, Voltaren, Relafen, etc. If medications are used for therapeutic reasons, please inform us as they can cause increased bleeding or prolonged bleeding during and bruising after surgical procedures.   4. Please advice us if you are taking any "blood thinner" medications such as Coumadin or Dipyridamole or Plavix or similar medications. These cause increased bleeding and prolonged bleeding during and bruising after surgical procedures. We may have to consider discontinuing these medications briefly prior to and shortly after your surgery, if safe to do so.   5. Please inform us of all medications you are currently taking. All medications that are taken regularly should be taken the day of surgery as you always do. Nevertheless, we need to be informed of what medications you are taking prior to surgery to whether they will affect the  procedure or cause any complications.   6. Please inform us of any medication allergies. Also inform us of whether you have allergies to Latex or rubber products or whether you have had any adverse reaction to Lidocaine or Epinephrine.  7. Please inform us of any prosthetic or artificial body parts such as artificial heart valve, joint replacements, etc., or similar condition that might require preoperative antibiotics.   8. We recommend avoidance of alcohol at least two weeks prior to surgery and continued avoidence for at least two weeks after surgery.   9. We recommend discontinuation of tobacco smoking at least two weeks prior to surgery and continued abstinence for at least two weeks after surgery.  10. Do not plan strenuous exercise, strenuous work or strenuous lifting for approximately four weeks after your surgery.   11. We request if you are unable to make your scheduled surgical appointment, please call us at least a week in advance or as soon as you are aware of a problem sot aht we can cancel or reschedule you.   12. You MAKE TAKE TYLENOL (acetaminophen) for pain as it is not a blood thinner.   13. PLEASE PLAN TO BE IN TOWN FOR TWO WEEKS FOLLOWING SURGERY, THIS IS IMPORTANT SO YOU CAN BE CHECKED FOR DRESSING CHANGES, SUTURE REMOVAL AND TO MONITOR FOR POSSIBLE COMPLICATIONS.   

## 2019-07-04 ENCOUNTER — Other Ambulatory Visit: Payer: Self-pay | Admitting: Family Medicine

## 2019-07-04 NOTE — Telephone Encounter (Signed)
Name of Medication: Tramadol Name of Pharmacy: [facility] or Written Date and Quantity: 11/30/18 #90 tabs with 3 refills  Last Office Visit and Type: 04/05/19 check knot (CPE on 11/30/18) Next Office Visit and Type: none scheduled  Last Controlled Substance Agreement Date:01/02/15 Last UDS:01/02/15

## 2019-07-11 ENCOUNTER — Ambulatory Visit: Payer: Medicare Other | Admitting: Dermatology

## 2019-07-11 ENCOUNTER — Other Ambulatory Visit: Payer: Self-pay

## 2019-07-11 DIAGNOSIS — L720 Epidermal cyst: Secondary | ICD-10-CM

## 2019-07-11 NOTE — Progress Notes (Signed)
   Follow-Up Visit   Subjective  [name] [name] is a 69 y.o. female who presents for the following: Cyst (post neck, pt presents for excision).   The following portions of the chart were reviewed this encounter and updated as appropriate:      Review of Systems:  No other skin or systemic complaints except as noted in HPI or Assessment and Plan.  Objective  Well appearing patient in no apparent distress; mood and affect are within normal limits.  A focused examination was performed including posterior neck. Relevant physical exam findings are noted in the Assessment and Plan.  Objective  Neck - Posterior: Subcutaneous nodule with punctum 1.5cm   Assessment & Plan  Epidermal cyst Neck - Posterior  Skin excision - Neck - Posterior  Lesion length (cm):  1.5 Lesion width (cm):  1.5 Margin per side (cm):  0 Total excision diameter (cm):  1.5 Informed consent: discussed and consent obtained   Timeout: patient name, date of birth, surgical site, and procedure verified   Procedure prep:  Patient was prepped and draped in usual sterile fashion Prep type:  Povidone-iodine Anesthesia: the lesion was anesthetized in a standard fashion   Anesthetic:  1% lidocaine w/ epinephrine 1-100,000 buffered w/ 8.4% NaHCO3 (11cc) Instrument used: #15 blade   Hemostasis achieved with: pressure and electrodesiccation    Skin repair - Neck - Posterior Complexity:  Intermediate Final length (cm):  1.8 Reason for type of repair: reduce tension to allow closure, reduce subcutaneous dead space and avoid a hematoma and enhance both functionality and cosmetic results   Undermining: edges could be approximated without difficulty and edges undermined   Subcutaneous layers (deep stitches):  Suture size:  4-0 Suture type: Vicryl (polyglactin 910)   Stitches:  Buried vertical mattress Fine/surface layer approximation (top stitches):  Suture size:  4-0 Suture type comment:  Nylon Stitches: simple  interrupted   Suture removal (days):  7 Hemostasis achieved with: suture Outcome: patient tolerated procedure well with no complications   Post-procedure details: sterile dressing applied and wound care instructions given   Dressing type: pressure dressing (Mupirocin)    Specimen 1 - Surgical pathology Differential Diagnosis: Cyst vs other Check Margins: No Cystic pap 1.5cm  Return in about 1 week (around 07/18/2019) for sr.  I, Othelia Pulling, RMA, am acting as scribe for Brendolyn Patty, MD .  Documentation: I have reviewed the above documentation for accuracy and completeness, and I agree with the above.  Brendolyn Patty MD

## 2019-07-11 NOTE — Patient Instructions (Signed)

## 2019-07-12 ENCOUNTER — Telehealth: Payer: Self-pay

## 2019-07-12 NOTE — Telephone Encounter (Signed)
Lft pt msg to call office if any problems after yesterdays surgery.[name] [name]

## 2019-07-18 ENCOUNTER — Other Ambulatory Visit: Payer: Self-pay

## 2019-07-18 ENCOUNTER — Ambulatory Visit (INDEPENDENT_AMBULATORY_CARE_PROVIDER_SITE_OTHER): Payer: Medicare Other | Admitting: Dermatology

## 2019-07-18 DIAGNOSIS — L720 Epidermal cyst: Secondary | ICD-10-CM

## 2019-07-18 DIAGNOSIS — Z4802 Encounter for removal of sutures: Secondary | ICD-10-CM

## 2019-07-18 NOTE — Progress Notes (Signed)
   Follow-Up Visit   Subjective  [name] [name] is a 69 y.o. female who presents for the following: Post op (Cyst- post neck, margins free).  Spot healing well.   The following portions of the chart were reviewed this encounter and updated as appropriate:      Review of Systems:  No other skin or systemic complaints except as noted in HPI or Assessment and Plan.  Objective  Well appearing patient in no apparent distress; mood and affect are within normal limits.  A focused examination was performed including posterior neck. Relevant physical exam findings are noted in the Assessment and Plan.  Objective  Posterior Neck: Healing excision site.   Assessment & Plan  Epidermal inclusion cyst Posterior Neck  Margins free. Pt advised. Wound cleansed, sutures removed, and steri strips applied.  Return if symptoms worsen or fail to improve.  IJamesetta Orleans, CMA, am acting as scribe for Brendolyn Patty, MD .  Documentation: I have reviewed the above documentation for accuracy and completeness, and I agree with the above.  Brendolyn Patty MD

## 2019-10-28 ENCOUNTER — Other Ambulatory Visit: Payer: Self-pay | Admitting: Family Medicine

## 2019-11-02 ENCOUNTER — Encounter: Payer: Self-pay | Admitting: Family Medicine

## 2019-11-02 ENCOUNTER — Other Ambulatory Visit: Payer: Self-pay

## 2019-11-02 ENCOUNTER — Telehealth (INDEPENDENT_AMBULATORY_CARE_PROVIDER_SITE_OTHER): Payer: Medicare Other | Admitting: Family Medicine

## 2019-11-02 DIAGNOSIS — J069 Acute upper respiratory infection, unspecified: Secondary | ICD-10-CM | POA: Diagnosis not present

## 2019-11-02 NOTE — Progress Notes (Signed)
Virtual Visit via Telephone Note  I connected with [name] [name] on 11/02/19 at  4:00 PM EDT by telephone and verified that I am speaking with the correct person using two identifiers.  Location: Patient: home Provider: office   I discussed the limitations, risks, security and privacy concerns of performing an evaluation and management service by telephone and the availability of in person appointments. I also discussed with the patient that there may be a patient responsible charge related to this service. The patient expressed understanding and agreed to proceed.  Parties involved in encounter  Patient: [name] [name]  Provider:  Loura Pardon MD     History of Present Illness: Pt presents with head and chest congestion and head congestion   Symptoms started last Friday  Started in her head and then went to chest  Phlegm is yellow to white   No fever-nl temp today 98.1  No chills/sweats/aches   (feels generally cold) No body aches   No wheezing or sob  Some sinus pressure - below her eyes - both sides  Ears are ok  Throat is a little sore with pnd  Not hoarse   Some headache - in both temples   No GI symptoms at all   Has not lost taste or smell    Has not been tested for covid  Has not been around anyone sick   Not immunized for covid     She is a current smoker 1/2 ppd or less-that is the same   Otc: no medicines  No change in regular medicines  Drinking lots of fluids- water and gatorade   Patient Active Problem List   Diagnosis Date Noted  . Viral URI with cough 11/02/2019  . Elevated glucose level 01/02/2019  . Elevated TSH 11/30/2018  . Grief reaction 11/24/2017  . Multiple joint pain 09/16/2016  . Osteopenia 06/15/2016  . Screening mammogram, encounter for 04/28/2016  . Medicare annual wellness visit, initial 01/02/2015  . Colon cancer screening 05/31/2013  . Hyperkalemia 11/30/2012  . Retinopathy, bilateral 10/05/2012  . Hyperlipidemia,  mild 10/05/2012  . Adverse effects of medication 03/24/2012  . Routine general medical examination at a health care facility 01/22/2011  . Generalized osteoarthritis of hand 07/06/2009  . SEBACEOUS CYST, NECK 07/13/2007  . SLEEP DISORDER 01/18/2007  . POSTHERPETIC NEURALGIA 12/15/2006  . TOBACCO ABUSE 12/15/2006  . CARPAL TUNNEL SYNDROME 12/15/2006  . Osteoarthritis 12/15/2006  . Fibromyalgia 12/15/2006   Past Medical History:  Diagnosis Date  . Arthritis    OA  . Carpal tunnel syndrome   . Depression   . Endometriosis   . Fibromyalgia   . Post herpetic neuralgia   . Sleep disorder   . Tobacco abuse    Past Surgical History:  Procedure Laterality Date  . ABDOMINAL HYSTERECTOMY  1992   total endometriosis  . APPENDECTOMY  1992  . bladder tack    . CATARACT EXTRACTION W/PHACO Right 03/25/2018   Procedure: CATARACT EXTRACTION PHACO AND INTRAOCULAR LENS PLACEMENT ([place]) RIGHT;  Surgeon: Marchia Meiers, MD;  Location: [facility] ORS;  Service: Ophthalmology;  Laterality: Right;  [place]  01:11 CDE 8.02 Fluid pack lot # [id number]  . CATARACT EXTRACTION W/PHACO Left 04/22/2018   Procedure: CATARACT EXTRACTION PHACO AND INTRAOCULAR LENS PLACEMENT ([place]) Left Eye;  Surgeon: Marchia Meiers, MD;  Location: [facility] ORS;  Service: Ophthalmology;  Laterality: Left;  [place]  01:02 CDE 8.11 Fluid pack lot # [id number]  . FOOT SURGERY Right 04/2016   [place]  in [place] History   Tobacco Use  . Smoking status: Current Every Day Smoker    Packs/day: 0.30  . Smokeless tobacco: Never Used  . Tobacco comment: less than 1/2 a pack  Vaping Use  . Vaping Use: Never used  Substance Use Topics  . Alcohol use: No    Alcohol/week: 0.0 standard drinks  . Drug use: No   Family History  Problem Relation Age of Onset  . Heart disease Mother        MI  . Heart disease Father        MI  . Arthritis Sister        crippling arthritis  . Arthritis Brother        crippling arthritis   Allergies   Allergen Reactions  . Acetaminophen Other (See Comments)    GI upset   Current Outpatient Medications on File Prior to Visit  Medication Sig Dispense Refill  . aspirin 81 MG tablet Take 81 mg by mouth daily.      Marland Kitchen atorvastatin (LIPITOR) 10 MG tablet Take 1 tablet (10 mg total) by mouth daily. 90 tablet 3  . busPIRone (BUSPAR) 15 MG tablet Take 0.5 tablets (7.5 mg total) by mouth 2 (two) times daily. 90 tablet 3  . DULoxetine (CYMBALTA) 60 MG capsule Take 1 capsule (60 mg total) by mouth daily. 90 capsule 3  . Melatonin 10 MG CAPS Take 20 mg by mouth at bedtime.    . naproxen sodium (ALEVE) 220 MG tablet Take 440 mg by mouth daily as needed (headaches).    . pregabalin (LYRICA) 75 MG capsule TAKE 1 CAPSULE BY MOUTH TWICE A DAY 180 capsule 0  . traMADol (ULTRAM) 50 MG tablet TAKE 1 TABLET (50 MG TOTAL) BY MOUTH EVERY 8 (EIGHT) HOURS AS NEEDED. 90 tablet 3   No current facility-administered medications on file prior to visit.   Review of Systems  Constitutional: Positive for malaise/fatigue. Negative for chills, diaphoresis and fever.  HENT: Positive for congestion and sore throat. Negative for ear pain and sinus pain.   Eyes: Negative for blurred vision, discharge and redness.  Respiratory: Positive for cough and sputum production. Negative for shortness of breath, wheezing and stridor.   Cardiovascular: Negative for chest pain, palpitations and leg swelling.  Gastrointestinal: Negative for abdominal pain, diarrhea, nausea and vomiting.  Musculoskeletal: Negative for myalgias.  Skin: Negative for rash.  Neurological: Positive for headaches. Negative for dizziness.      Observations/Objective: Pt sounds like her normal self-not distressed  Slightly hoarse voice occ wet sounding cough  Unable to hear wheeze (even on forced exp) or sob  Nl mood Nl cognition- good historian    Assessment and Plan: Problem List Items Addressed This Visit      Respiratory   Viral URI with cough     Head and chest congestion in a smoker -not severe  No known covid contacts and has not been tested yet  Will get tested at [facility] or use the cone phone # to find a testing site and then call us with results  In the meantime inst to isolate /rest/drink fluids  Try mucinex dm for cough  Work on smoking cessation  Tylenol, aleve prn for headache Watch for sob, worse cough/sinis pain -call  Also for fever  If suddenly severe adv to go to ER for urgent eval  Update if not starting to improve in a week or if worsening   Encouraged strongly to consider  getting a covid vaccination after she feels better as well           Follow Up Instructions: Drink fluids and rest  Try mucinex dm for cough and congestion  Tylenol or aleve for headache  Consider quitting smoking, especially while sick  Watch for increased cough with phlegm, wheezing, shortness of breath or sinus pain  Call as well if any new symptoms  Go ahead and get tested for covid , isolate and then call us with results  When feeling better I highly recommend covid vaccination    I discussed the assessment and treatment plan with the patient. The patient was provided an opportunity to ask questions and all were answered. The patient agreed with the plan and demonstrated an understanding of the instructions.   The patient was advised to call back or seek an in-person evaluation if the symptoms worsen or if the condition fails to improve as anticipated.  I provided 19 minutes of non-face-to-face time during this encounter.   Loura Pardon, MD

## 2019-11-02 NOTE — Patient Instructions (Addendum)
Drink fluids and rest  Try mucinex dm for cough and congestion  Tylenol or aleve for headache  Consider quitting smoking, especially while sick  Watch for increased cough with phlegm, wheezing, shortness of breath or sinus pain  Call as well if any new symptoms  Go ahead and get tested for covid , isolate and then call us with results  When feeling better I highly recommend covid vaccination   COVID-19 Vaccine Information can be found at: [email or web address].uk For questions related to vaccine distribution or appointments, please email [email or web address] or call [phone].

## 2019-11-02 NOTE — Assessment & Plan Note (Signed)
Head and chest congestion in a smoker -not severe  No known covid contacts and has not been tested yet  Will get tested at [facility] or use the cone phone # to find a testing site and then call us with results  In the meantime inst to isolate /rest/drink fluids  Try mucinex dm for cough  Work on smoking cessation  Tylenol, aleve prn for headache Watch for sob, worse cough/sinis pain -call  Also for fever  If suddenly severe adv to go to ER for urgent eval  Update if not starting to improve in a week or if worsening   Encouraged strongly to consider getting a covid vaccination after she feels better as well

## 2019-12-26 ENCOUNTER — Other Ambulatory Visit: Payer: Self-pay

## 2019-12-26 ENCOUNTER — Ambulatory Visit (INDEPENDENT_AMBULATORY_CARE_PROVIDER_SITE_OTHER): Payer: Medicare Other | Admitting: Family Medicine

## 2019-12-26 ENCOUNTER — Encounter: Payer: Self-pay | Admitting: Family Medicine

## 2019-12-26 VITALS — BP 90/60 | HR 73 | Temp 98.0°F | Ht 64.75 in | Wt 158.5 lb

## 2019-12-26 DIAGNOSIS — R270 Ataxia, unspecified: Secondary | ICD-10-CM

## 2019-12-26 DIAGNOSIS — R300 Dysuria: Secondary | ICD-10-CM | POA: Diagnosis not present

## 2019-12-26 DIAGNOSIS — R519 Headache, unspecified: Secondary | ICD-10-CM | POA: Diagnosis not present

## 2019-12-26 DIAGNOSIS — M791 Myalgia, unspecified site: Secondary | ICD-10-CM | POA: Diagnosis not present

## 2019-12-26 DIAGNOSIS — R410 Disorientation, unspecified: Secondary | ICD-10-CM

## 2019-12-26 DIAGNOSIS — R29898 Other symptoms and signs involving the musculoskeletal system: Secondary | ICD-10-CM

## 2019-12-26 DIAGNOSIS — R112 Nausea with vomiting, unspecified: Secondary | ICD-10-CM

## 2019-12-26 DIAGNOSIS — M2550 Pain in unspecified joint: Secondary | ICD-10-CM | POA: Diagnosis not present

## 2019-12-26 LAB — POC URINALSYSI DIPSTICK (AUTOMATED)
Bilirubin, UA: NEGATIVE
Blood, UA: NEGATIVE
Glucose, UA: NEGATIVE
Ketones, UA: NEGATIVE
Nitrite, UA: NEGATIVE
Protein, UA: POSITIVE — AB
Spec Grav, UA: 1.01 (ref 1.010–1.025)
Urobilinogen, UA: 1 E.U./dL
pH, UA: 6 (ref 5.0–8.0)

## 2019-12-26 MED ORDER — SULFAMETHOXAZOLE-TRIMETHOPRIM 800-160 MG PO TABS: 1.0000 | ORAL_TABLET | Freq: Two times a day (BID) | ORAL | 0 refills | Status: AC
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
## 2019-12-26 NOTE — Patient Instructions (Addendum)
Your Covid-19 test will take 24-48 hours to come back  Flu test will come back fast  Urine culture will take 48-72 hours to come back  MRI of your brain will have to be set-up after the Covid test comes back

## 2019-12-27 LAB — SARS-COV-2, NAA 2 DAY TAT

## 2019-12-27 LAB — URINE CULTURE
MICRO NUMBER:: 11113788
Result:: NO GROWTH
SPECIMEN QUALITY:: ADEQUATE

## 2019-12-27 LAB — SPECIMEN STATUS REPORT

## 2019-12-27 LAB — NOVEL CORONAVIRUS, NAA: SARS-CoV-2, NAA: NOT DETECTED

## 2019-12-27 LAB — POCT INFLUENZA A/B
Influenza A, POC: NEGATIVE
Influenza B, POC: NEGATIVE

## 2020-01-15 ENCOUNTER — Other Ambulatory Visit: Payer: Self-pay | Admitting: Family Medicine

## 2020-01-16 ENCOUNTER — Other Ambulatory Visit: Payer: Medicare Other

## 2020-01-16 NOTE — Telephone Encounter (Signed)
All 3 meds were filled on 11/30/18 3 month supply with 3 refills, pt has had a few acute appts but last CPE was on 11/30/18 and no future appts

## 2020-01-16 NOTE — Telephone Encounter (Signed)
Please schedule PE and refill until then  

## 2020-01-17 NOTE — Telephone Encounter (Signed)
Med refilled once and Carrie will reach out to pt to try and get appt scheduled  

## 2020-01-25 ENCOUNTER — Other Ambulatory Visit: Payer: Self-pay | Admitting: Family Medicine

## 2020-01-25 NOTE — Telephone Encounter (Signed)
Refill request for Tramadol 50 mg tablets.  LOV- 12/26/19 Next OV- 02/07/20 Last refilled- 07/04/19

## 2020-01-30 ENCOUNTER — Telehealth: Payer: Self-pay | Admitting: Family Medicine

## 2020-01-30 DIAGNOSIS — Z0000 Encounter for general adult medical examination without abnormal findings: Secondary | ICD-10-CM

## 2020-01-30 DIAGNOSIS — E875 Hyperkalemia: Secondary | ICD-10-CM

## 2020-01-30 DIAGNOSIS — R7989 Other specified abnormal findings of blood chemistry: Secondary | ICD-10-CM

## 2020-01-30 DIAGNOSIS — E785 Hyperlipidemia, unspecified: Secondary | ICD-10-CM

## 2020-01-30 DIAGNOSIS — R7309 Other abnormal glucose: Secondary | ICD-10-CM

## 2020-01-30 NOTE — Telephone Encounter (Signed)
-----   Message from Cloyd Stagers, RT sent at 01/17/2020 11:24 AM EST ----- Regarding: Lab Orders for Tuesday 11.30.2021 Please place lab orders for Tuesday 11.30.2021, office visit for physical on Tuesday 12.7.2021 Thank you, Dyke Maes RT(R)

## 2020-01-31 ENCOUNTER — Other Ambulatory Visit (INDEPENDENT_AMBULATORY_CARE_PROVIDER_SITE_OTHER): Payer: Medicare Other

## 2020-01-31 ENCOUNTER — Ambulatory Visit (INDEPENDENT_AMBULATORY_CARE_PROVIDER_SITE_OTHER): Payer: Medicare Other

## 2020-01-31 ENCOUNTER — Other Ambulatory Visit: Payer: Self-pay

## 2020-01-31 DIAGNOSIS — R7989 Other specified abnormal findings of blood chemistry: Secondary | ICD-10-CM

## 2020-01-31 DIAGNOSIS — E875 Hyperkalemia: Secondary | ICD-10-CM

## 2020-01-31 DIAGNOSIS — E785 Hyperlipidemia, unspecified: Secondary | ICD-10-CM

## 2020-01-31 DIAGNOSIS — R7309 Other abnormal glucose: Secondary | ICD-10-CM

## 2020-01-31 DIAGNOSIS — Z0000 Encounter for general adult medical examination without abnormal findings: Secondary | ICD-10-CM | POA: Diagnosis not present

## 2020-01-31 LAB — CBC WITH DIFFERENTIAL/PLATELET
Basophils Absolute: 0.1 10*3/uL (ref 0.0–0.1)
Basophils Relative: 0.8 % (ref 0.0–3.0)
Eosinophils Absolute: 0.3 10*3/uL (ref 0.0–0.7)
Eosinophils Relative: 4.1 % (ref 0.0–5.0)
HCT: 41.9 % (ref 36.0–46.0)
Hemoglobin: 14.2 g/dL (ref 12.0–15.0)
Lymphocytes Relative: 39.1 % (ref 12.0–46.0)
Lymphs Abs: 2.5 10*3/uL (ref 0.7–4.0)
MCHC: 33.9 g/dL (ref 30.0–36.0)
MCV: 91.1 fl (ref 78.0–100.0)
Monocytes Absolute: 0.5 10*3/uL (ref 0.1–1.0)
Monocytes Relative: 7.6 % (ref 3.0–12.0)
Neutro Abs: 3.1 10*3/uL (ref 1.4–7.7)
Neutrophils Relative %: 48.4 % (ref 43.0–77.0)
Platelets: 259 10*3/uL (ref 150.0–400.0)
RBC: 4.6 Mil/uL (ref 3.87–5.11)
RDW: 15.3 % (ref 11.5–15.5)
WBC: 6.5 10*3/uL (ref 4.0–10.5)

## 2020-01-31 LAB — COMPREHENSIVE METABOLIC PANEL
ALT: 11 U/L (ref 0–35)
AST: 15 U/L (ref 0–37)
Albumin: 4.1 g/dL (ref 3.5–5.2)
Alkaline Phosphatase: 97 U/L (ref 39–117)
BUN: 11 mg/dL (ref 6–23)
CO2: 32 mEq/L (ref 19–32)
Calcium: 10 mg/dL (ref 8.4–10.5)
Chloride: 104 mEq/L (ref 96–112)
Creatinine, Ser: 0.71 mg/dL (ref 0.40–1.20)
GFR: 85.94 mL/min (ref >60.00)
Glucose, Bld: 90 mg/dL (ref 70–99)
Potassium: 5.3 mEq/L — ABNORMAL HIGH (ref 3.5–5.1)
Sodium: 140 mEq/L (ref 135–145)
Total Bilirubin: 0.4 mg/dL (ref 0.2–1.2)
Total Protein: 7 g/dL (ref 6.0–8.3)

## 2020-01-31 LAB — LIPID PANEL
Cholesterol: 182 mg/dL (ref 0–200)
HDL: 78.1 mg/dL (ref >39.00)
LDL Cholesterol: 89 mg/dL (ref 0–99)
NonHDL: 104.05
Total CHOL/HDL Ratio: 2
Triglycerides: 77 mg/dL (ref 0.0–149.0)
VLDL: 15.4 mg/dL (ref 0.0–40.0)

## 2020-01-31 LAB — T4, FREE: Free T4: 0.72 ng/dL (ref 0.60–1.60)

## 2020-01-31 LAB — TSH: TSH: 5.28 u[IU]/mL — ABNORMAL HIGH (ref 0.35–4.50)

## 2020-01-31 LAB — HEMOGLOBIN A1C: Hgb A1c MFr Bld: 5.4 % (ref 4.6–6.5)

## 2020-01-31 NOTE — Patient Instructions (Signed)
[name] [name] , Thank you for taking time to come for your Medicare Wellness Visit. I appreciate your ongoing commitment to your health goals. Please review the following plan we discussed and let me know if I can assist you in the future.   Screening recommendations/referrals: Colonoscopy: due, Please complete Cologuard kit and return to lab.  Mammogram: declined Bone Density: declined Recommended yearly ophthalmology/optometry visit for glaucoma screening and checkup Recommended yearly dental visit for hygiene and checkup  Vaccinations: Influenza vaccine: due, will get at upcoming physical Pneumococcal vaccine: Completed series Tdap vaccine: decline (insurance) Shingles vaccine: due, check with your insurance regarding coverage/cost if interested    Covid-19:declined  Advanced directives: Please bring a copy of your POA (Power of [place]) and/or Living Will to your next appointment.   Conditions/risks identified: hyperlipidemia  Next appointment: Follow up in one year for your annual wellness visit    Preventive Care 70 Years and Older, Female Preventive care refers to lifestyle choices and visits with your health care provider that can promote health and wellness. What does preventive care include?  A yearly physical exam. This is also called an annual well check.  Dental exams once or twice a year.  Routine eye exams. Ask your health care provider how often you should have your eyes checked.  Personal lifestyle choices, including:  Daily care of your teeth and gums.  Regular physical activity.  Eating a healthy diet.  Avoiding tobacco and drug use.  Limiting alcohol use.  Practicing safe sex.  Taking low-dose aspirin every day.  Taking vitamin and mineral supplements as recommended by your health care provider. What happens during an annual well check? The services and screenings done by your health care provider during your annual well check will depend on your  age, overall health, lifestyle risk factors, and family history of disease. Counseling  Your health care provider may ask you questions about your:  Alcohol use.  Tobacco use.  Drug use.  Emotional well-being.  Home and relationship well-being.  Sexual activity.  Eating habits.  History of falls.  Memory and ability to understand (cognition).  Work and work Statistician.  Reproductive health. Screening  You may have the following tests or measurements:  Height, weight, and BMI.  Blood pressure.  Lipid and cholesterol levels. These may be checked every 5 years, or more frequently if you are over 42 years old.  Skin check.  Lung cancer screening. You may have this screening every year starting at age 53 if you have a 30-pack-year history of smoking and currently smoke or have quit within the past 15 years.  Fecal occult blood test (FOBT) of the stool. You may have this test every year starting at age 35.  Flexible sigmoidoscopy or colonoscopy. You may have a sigmoidoscopy every 5 years or a colonoscopy every 10 years starting at age 29.  Hepatitis C blood test.  Hepatitis B blood test.  Sexually transmitted disease (STD) testing.  Diabetes screening. This is done by checking your blood sugar (glucose) after you have not eaten for a while (fasting). You may have this done every 1-3 years.  Bone density scan. This is done to screen for osteoporosis. You may have this done starting at age 52.  Mammogram. This may be done every 1-2 years. Talk to your health care provider about how often you should have regular mammograms. Talk with your health care provider about your test results, treatment options, and if necessary, the need for more tests. Vaccines  Your health care provider may recommend certain vaccines, such as:  Influenza vaccine. This is recommended every year.  Tetanus, diphtheria, and acellular pertussis (Tdap, Td) vaccine. You may need a Td booster  every 10 years.  Zoster vaccine. You may need this after age 31.  Pneumococcal 13-valent conjugate (PCV13) vaccine. One dose is recommended after age 47.  Pneumococcal polysaccharide (PPSV23) vaccine. One dose is recommended after age 60. Talk to your health care provider about which screenings and vaccines you need and how often you need them. This information is not intended to replace advice given to you by your health care provider. Make sure you discuss any questions you have with your health care provider. Document Released: 03/16/2015 Document Revised: 11/07/2015 Document Reviewed: 12/19/2014 Elsevier Interactive Patient Education  2017 [place] Prevention in the Home Falls can cause injuries. They can happen to people of all ages. There are many things you can do to make your home safe and to help prevent falls. What can I do on the outside of my home?  Regularly fix the edges of walkways and driveways and fix any cracks.  Remove anything that might make you trip as you walk through a door, such as a raised step or threshold.  Trim any bushes or trees on the path to your home.  Use bright outdoor lighting.  Clear any walking paths of anything that might make someone trip, such as rocks or tools.  Regularly check to see if handrails are loose or broken. Make sure that both sides of any steps have handrails.  Any raised decks and porches should have guardrails on the edges.  Have any leaves, snow, or ice cleared regularly.  Use sand or salt on walking paths during winter.  Clean up any spills in your garage right away. This includes oil or grease spills. What can I do in the bathroom?  Use night lights.  Install grab bars by the toilet and in the tub and shower. Do not use towel bars as grab bars.  Use non-skid mats or decals in the tub or shower.  If you need to sit down in the shower, use a plastic, non-slip stool.  Keep the floor dry. Clean up any  water that spills on the floor as soon as it happens.  Remove soap buildup in the tub or shower regularly.  Attach bath mats securely with double-sided non-slip rug tape.  Do not have throw rugs and other things on the floor that can make you trip. What can I do in the bedroom?  Use night lights.  Make sure that you have a light by your bed that is easy to reach.  Do not use any sheets or blankets that are too big for your bed. They should not hang down onto the floor.  Have a firm chair that has side arms. You can use this for support while you get dressed.  Do not have throw rugs and other things on the floor that can make you trip. What can I do in the kitchen?  Clean up any spills right away.  Avoid walking on wet floors.  Keep items that you use a lot in easy-to-reach places.  If you need to reach something above you, use a strong step stool that has a grab bar.  Keep electrical cords out of the way.  Do not use floor polish or wax that makes floors slippery. If you must use wax, use non-skid floor wax.  Do  not have throw rugs and other things on the floor that can make you trip. What can I do with my stairs?  Do not leave any items on the stairs.  Make sure that there are handrails on both sides of the stairs and use them. Fix handrails that are broken or loose. Make sure that handrails are as long as the stairways.  Check any carpeting to make sure that it is firmly attached to the stairs. Fix any carpet that is loose or worn.  Avoid having throw rugs at the top or bottom of the stairs. If you do have throw rugs, attach them to the floor with carpet tape.  Make sure that you have a light switch at the top of the stairs and the bottom of the stairs. If you do not have them, ask someone to add them for you. What else can I do to help prevent falls?  Wear shoes that:  Do not have high heels.  Have rubber bottoms.  Are comfortable and fit you well.  Are closed  at the toe. Do not wear sandals.  If you use a stepladder:  Make sure that it is fully opened. Do not climb a closed stepladder.  Make sure that both sides of the stepladder are locked into place.  Ask someone to hold it for you, if possible.  Clearly mark and make sure that you can see:  Any grab bars or handrails.  First and last steps.  Where the edge of each step is.  Use tools that help you move around (mobility aids) if they are needed. These include:  Canes.  Walkers.  Scooters.  Crutches.  Turn on the lights when you go into a dark area. Replace any light bulbs as soon as they burn out.  Set up your furniture so you have a clear path. Avoid moving your furniture around.  If any of your floors are uneven, fix them.  If there are any pets around you, be aware of where they are.  Review your medicines with your doctor. Some medicines can make you feel dizzy. This can increase your chance of falling. Ask your doctor what other things that you can do to help prevent falls. This information is not intended to replace advice given to you by your health care provider. Make sure you discuss any questions you have with your health care provider. Document Released: 12/14/2008 Document Revised: 07/26/2015 Document Reviewed: 03/24/2014 Elsevier Interactive Patient Education  2017 [employer].

## 2020-01-31 NOTE — Progress Notes (Signed)
PCP notes:  Health Maintenance: Flu- due Tdap- insurance Covid- declined Mammogram- declined Dexa- declined Cologuard- due   Abnormal Screenings: none   Patient concerns: none   Nurse concerns: none   Next PCP appt.: 02/07/2020 @ 11:30 am

## 2020-01-31 NOTE — Progress Notes (Signed)
Subjective:   [name] [name] is a 70 y.o. female who presents for Medicare Annual (Subsequent) preventive examination.  Review of Systems: N/A      I connected with the patient today by telephone and verified that I am speaking with the correct person using two identifiers. Location patient: home Location nurse: work Persons participating in the telephone visit: patient, nurse.   I discussed the limitations, risks, security and privacy concerns of performing an evaluation and management service by telephone and the availability of in person appointments. I also discussed with the patient that there may be a patient responsible charge related to this service. The patient expressed understanding and verbally consented to this telephonic visit.        Cardiac Risk Factors include: advanced age (>71men, >64 women);Other (see comment);smoking/ tobacco exposure, Risk factor comments: hyperlipidemia     Objective:    Today's Vitals   There is no height or weight on file to calculate BMI.  Advanced Directives 01/31/2020 11/24/2018 03/25/2018 11/24/2017 09/12/2016  Does Patient Have a Medical Advance Directive? Yes Yes Yes Yes Yes  Type of Paramedic of [place];Living will [place];Living will [place];Living will [place];Living will [place];Living will  Does patient want to make changes to medical advance directive? - - No - Patient declined - -  Copy of [place] in Chart? No - copy requested No - copy requested No - copy requested No - copy requested No - copy requested    Current Medications (verified) Outpatient Encounter Medications as of 01/31/2020  Medication Sig  . aspirin 81 MG tablet Take 81 mg by mouth daily.    Marland Kitchen atorvastatin (LIPITOR) 10 MG tablet TAKE 1 TABLET BY MOUTH EVERY DAY  . busPIRone (BUSPAR) 15 MG tablet TAKE 0.5 TABLETS (7.5 MG TOTAL) BY  MOUTH 2 (TWO) TIMES DAILY.  . DULoxetine (CYMBALTA) 60 MG capsule TAKE 1 CAPSULE BY MOUTH EVERY DAY  . Melatonin 10 MG CAPS Take 20 mg by mouth at bedtime.  . naproxen sodium (ALEVE) 220 MG tablet Take 440 mg by mouth daily as needed (headaches).  . pregabalin (LYRICA) 75 MG capsule TAKE 1 CAPSULE BY MOUTH TWICE A DAY  . traMADol (ULTRAM) 50 MG tablet TAKE 1 TABLET (50 MG TOTAL) BY MOUTH EVERY 8 (EIGHT) HOURS AS NEEDED.   No facility-administered encounter medications on file as of 01/31/2020.    Allergies (verified) Acetaminophen   History: Past Medical History:  Diagnosis Date  . Arthritis    OA  . Carpal tunnel syndrome   . Depression   . Endometriosis   . Fibromyalgia   . Post herpetic neuralgia   . Sleep disorder   . Tobacco abuse    Past Surgical History:  Procedure Laterality Date  . ABDOMINAL HYSTERECTOMY  1992   total endometriosis  . APPENDECTOMY  1992  . bladder tack    . CATARACT EXTRACTION W/PHACO Right 03/25/2018   Procedure: CATARACT EXTRACTION PHACO AND INTRAOCULAR LENS PLACEMENT ([place]) RIGHT;  Surgeon: Marchia Meiers, MD;  Location: [facility] ORS;  Service: Ophthalmology;  Laterality: Right;  [place]  01:11 CDE 8.02 Fluid pack lot # [id number]  . CATARACT EXTRACTION W/PHACO Left 04/22/2018   Procedure: CATARACT EXTRACTION PHACO AND INTRAOCULAR LENS PLACEMENT ([place]) Left Eye;  Surgeon: Marchia Meiers, MD;  Location: [facility] ORS;  Service: Ophthalmology;  Laterality: Left;  [place]  01:02 CDE 8.11 Fluid pack lot # [id number]  .  FOOT SURGERY Right 04/2016   [facility] in [place]   Family History  Problem Relation Age of Onset  . Heart disease Mother        MI  . Heart disease Father        MI  . Arthritis Sister        crippling arthritis  . Arthritis Brother        crippling arthritis   Social History   Socioeconomic History  . Marital status: Widowed    Spouse name: Not on file  . Number of children: Not on file  . Years of education: Not on file  . Highest  education level: Not on file  Occupational History  . Not on file  Tobacco Use  . Smoking status: Current Every Day Smoker    Packs/day: 0.30  . Smokeless tobacco: Never Used  . Tobacco comment: less than 1/2 a pack  Vaping Use  . Vaping Use: Never used  Substance and Sexual Activity  . Alcohol use: No    Alcohol/week: 0.0 standard drinks  . Drug use: No  . Sexual activity: Not Currently  Other Topics Concern  . Not on file  Social History Narrative  . Not on file   Social Determinants of Health   Financial Resource Strain: Low Risk   . Difficulty of Paying Living Expenses: Not hard at all  Food Insecurity: No Food Insecurity  . Worried About Charity fundraiser in the Last Year: Never true  . Ran Out of Food in the Last Year: Never true  Transportation Needs: No Transportation Needs  . Lack of Transportation (Medical): No  . Lack of Transportation (Non-Medical): No  Physical Activity: Sufficiently Active  . Days of Exercise per Week: 7 days  . Minutes of Exercise per Session: 30 min  Stress: No Stress Concern Present  . Feeling of Stress : Not at all  Social Connections:   . Frequency of Communication with Friends and Family: Not on file  . Frequency of Social Gatherings with Friends and Family: Not on file  . Attends Religious Services: Not on file  . Active Member of Clubs or Organizations: Not on file  . Attends Archivist Meetings: Not on file  . Marital Status: Not on file    Tobacco Counseling Ready to quit: Not Answered Counseling given: Not Answered Comment: less than 1/2 a pack   Clinical Intake:  Pre-visit preparation completed: Yes  Pain : No/denies pain     Nutritional Risks: None Diabetes: No  How often do you need to have someone help you when you read instructions, pamphlets, or other written materials from your doctor or pharmacy?: 1 - Never What is the last grade level you completed in school?: 11th  Diabetic: No Nutrition  Risk Assessment:  Has the patient had any N/V/D within the last 2 months?  No  Does the patient have any non-healing wounds?  No  Has the patient had any unintentional weight loss or weight gain?  No   Diabetes:  Is the patient diabetic?  No  If diabetic, was a CBG obtained today?  N/A Did the patient bring in their glucometer from home?  N/A How often do you monitor your CBG's? N/A.   Financial Strains and Diabetes Management:  Are you having any financial strains with the device, your supplies or your medication? N/A.  Does the patient want to be seen by Chronic Care Management for management of their diabetes?  N/A Would the patient like to be referred to a Nutritionist or for Diabetic Management?  N/A  Interpreter Needed?: No  Information entered by :: CJohnson, LPN   Activities of Daily Living In your present state of health, do you have any difficulty performing the following activities: 01/31/2020  Hearing? N  Vision? N  Difficulty concentrating or making decisions? N  Walking or climbing stairs? N  Dressing or bathing? N  Doing errands, shopping? N  Preparing Food and eating ? N  Using the Toilet? N  In the past six months, have you accidently leaked urine? Y  Comment sometimes, does not wear pads  Do you have problems with loss of bowel control? N  Managing your Medications? N  Managing your Finances? N  Housekeeping or managing your Housekeeping? N  Some recent data might be hidden    Patient Care Team: Tower, [name], MD as PCP - General  Indicate any recent Medical Services you may have received from other than [facility] providers in the past year (date may be approximate).     Assessment:   This is a routine wellness examination for [place].  Hearing/Vision screen  Hearing Screening   125Hz  250Hz  500Hz  1000Hz  2000Hz  3000Hz  4000Hz  6000Hz  8000Hz   Right ear:           Left ear:           Vision Screening Comments: Patient gets annual eye exams   Dietary  issues and exercise activities discussed: Current Exercise Habits: Home exercise routine, Type of exercise: walking, Time (Minutes): 30, Frequency (Times/Week): 7, Weekly Exercise (Minutes/Week): 210, Intensity: Moderate, Exercise limited by: None identified  Goals    . Patient Stated     Starting 11/24/2017, I will continue to take medications as prescribed.     . Patient Stated     Starting 11/24/2018, Patient wants to improve her mental health and anxiety issues.     . Patient Stated     01/31/2020, I will continue to walk everyday for about 20 minutes.      Depression Screen PHQ 2/9 Scores 01/31/2020 11/24/2018 11/24/2017 09/12/2016 01/02/2015 05/31/2013  PHQ - 2 Score 0 0 2 0 0 0  PHQ- 9 Score 0 0 8 - - -    Fall Risk Fall Risk  01/31/2020 11/24/2018 11/24/2017 09/12/2016 01/02/2015  Falls in the past year? 0 0 No No No  Number falls in past yr: 0 0 - - -  Injury with Fall? 0 - - - -  Risk for fall due to : Medication side effect Medication side effect - - -  Follow up Falls evaluation completed;Falls prevention discussed Falls evaluation completed;Falls prevention discussed - - -    Any stairs in or around the home? Yes  If so, are there any without handrails? No  Home free of loose throw rugs in walkways, pet beds, electrical cords, etc? Yes  Adequate lighting in your home to reduce risk of falls? Yes   ASSISTIVE DEVICES UTILIZED TO PREVENT FALLS:  Life alert? No  Use of a cane, walker or w/c? No  Grab bars in the bathroom? Yes  Shower chair or bench in shower? No  Elevated toilet seat or a handicapped toilet? No   TIMED UP AND GO:  Was the test performed? N/A, telephonic visit.   Cognitive Function: MMSE - Mini Mental State Exam 01/31/2020 11/24/2018 11/24/2017 09/12/2016  Orientation to time 5 5 5 5   Orientation to Place 5 5 5  5  Registration 3 3 3 3   Attention/ Calculation 5 0 0 0  Recall 3 3 3 3   Language- name 2 objects - - 0 0  Language- repeat 1 1 1 1   Language-  follow 3 step command - - 3 3  Language- read & follow direction - - 0 0  Write a sentence - - 0 0  Copy design - - 0 0  Total score - - 20 20  Mini Cog  Mini-Cog screen was completed. Maximum score is 22. A value of 0 denotes this part of the MMSE was not completed or the patient failed this part of the Mini-Cog screening.       Immunizations Immunization History  Administered Date(s) Administered  . Fluad Quad(high Dose 65+) 11/30/2018  . Influenza Split 01/22/2011, 03/24/2012  . Influenza, High Dose Seasonal PF 11/07/2016  . Influenza,inj,Quad PF,6+ Mos 11/30/2012, 12/06/2014, 11/22/2015, 11/24/2017  . Pneumococcal Conjugate-13 01/02/2015  . Pneumococcal Polysaccharide-23 01/22/2011, 09/12/2016  . Td 05/01/1998, 03/04/2007    TDAP status: Due, Education has been provided regarding the importance of this vaccine. Advised may receive this vaccine at local pharmacy or Health Dept. Aware to provide a copy of the vaccination record if obtained from local pharmacy or Health Dept. Verbalized acceptance and understanding. Flu Vaccine status: due, will get at upcoming physical  Pneumococcal vaccine status: Up to date Covid-19 vaccine status: Declined, Education has been provided regarding the importance of this vaccine but patient still declined. Advised may receive this vaccine at local pharmacy or Health Dept.or vaccine clinic. Aware to provide a copy of the vaccination record if obtained from local pharmacy or Health Dept. Verbalized acceptance and understanding.  Qualifies for Shingles Vaccine? Yes   Zostavax completed No   Shingrix Completed?: No.    Education has been provided regarding the importance of this vaccine. Patient has been advised to call insurance company to determine out of pocket expense if they have not yet received this vaccine. Advised may also receive vaccine at local pharmacy or Health Dept. Verbalized acceptance and understanding.  Screening Tests Health  Maintenance  Topic Date Due  . INFLUENZA VACCINE  10/02/2019  . Fecal DNA (Cologuard)  12/02/2019  . MAMMOGRAM  01/30/2021 (Originally 12/03/2018)  . COVID-19 Vaccine (1) 02/15/2021 (Originally 04/29/1962)  . TETANUS/TDAP  01/31/2024 (Originally 03/03/2017)  . DEXA SCAN  Completed  . Hepatitis C Screening  Completed  . PNA vac Low Risk Adult  Completed    Health Maintenance  Health Maintenance Due  Topic Date Due  . INFLUENZA VACCINE  10/02/2019  . Fecal DNA (Cologuard)  12/02/2019    Colorectal cancer screening:Cologuard due, will discuss with provider at physical  Mammogram status: declined Bone Density status: declined  Lung Cancer Screening: (Low Dose CT Chest recommended if Age 76-80 years, 30 pack-year currently smoking OR have quit w/in 15 years.) does not qualify.    Additional Screening:  Hepatitis C Screening: does qualify; Completed 11/24/2017  Vision Screening: Recommended annual ophthalmology exams for early detection of glaucoma and other disorders of the eye. Is the patient up to date with their annual eye exam?  Yes  Who is the provider or what is the name of the office in which the patient attends annual eye exams? [facility] If pt is not established with a provider, would they like to be referred to a provider to establish care? No .   Dental Screening: Recommended annual dental exams for proper oral hygiene  Community Resource Referral /  Chronic Care Management: CRR required this visit?  No   CCM required this visit?  No      Plan:     I have personally reviewed and noted the following in the patient's chart:   . Medical and social history . Use of alcohol, tobacco or illicit drugs  . Current medications and supplements . Functional ability and status . Nutritional status . Physical activity . Advanced directives . List of other physicians . Hospitalizations, surgeries, and ER visits in previous 12 months . Vitals . Screenings to  include cognitive, depression, and falls . Referrals and appointments  In addition, I have reviewed and discussed with patient certain preventive protocols, quality metrics, and best practice recommendations. A written personalized care plan for preventive services as well as general preventive health recommendations were provided to patient.   Due to this being a telephonic visit, the after visit summary with patients personalized plan was offered to patient via office or my-chart. Patient preferred to pick up at office at next visit or via mychart.   Andrez Grime, LPN   [id number]

## 2020-02-01 ENCOUNTER — Other Ambulatory Visit: Payer: Self-pay | Admitting: Family Medicine

## 2020-02-02 NOTE — Telephone Encounter (Signed)
Name of Medication: [place] Name of Pharmacy: [facility] or Written Date and Quantity: 10/28/19 #180 caps with 0 refills Last Office Visit and Type: 12/26/19 ?UTI with Dr. Lorelei Pont Next Office Visit and Type: CPE on 12/08/19

## 2020-02-07 ENCOUNTER — Ambulatory Visit (INDEPENDENT_AMBULATORY_CARE_PROVIDER_SITE_OTHER): Payer: Medicare Other | Admitting: Family Medicine

## 2020-02-07 ENCOUNTER — Encounter: Payer: Self-pay | Admitting: Family Medicine

## 2020-02-07 ENCOUNTER — Other Ambulatory Visit: Payer: Self-pay

## 2020-02-07 VITALS — BP 128/76 | HR 59 | Temp 97.3°F | Ht 64.5 in | Wt 149.4 lb

## 2020-02-07 DIAGNOSIS — E785 Hyperlipidemia, unspecified: Secondary | ICD-10-CM | POA: Diagnosis not present

## 2020-02-07 DIAGNOSIS — Z23 Encounter for immunization: Secondary | ICD-10-CM | POA: Diagnosis not present

## 2020-02-07 DIAGNOSIS — R7309 Other abnormal glucose: Secondary | ICD-10-CM

## 2020-02-07 DIAGNOSIS — F17200 Nicotine dependence, unspecified, uncomplicated: Secondary | ICD-10-CM | POA: Diagnosis not present

## 2020-02-07 DIAGNOSIS — M8589 Other specified disorders of bone density and structure, multiple sites: Secondary | ICD-10-CM | POA: Diagnosis not present

## 2020-02-07 DIAGNOSIS — M797 Fibromyalgia: Secondary | ICD-10-CM

## 2020-02-07 DIAGNOSIS — R7989 Other specified abnormal findings of blood chemistry: Secondary | ICD-10-CM

## 2020-02-07 DIAGNOSIS — Z0000 Encounter for general adult medical examination without abnormal findings: Secondary | ICD-10-CM

## 2020-02-07 MED ORDER — BUSPIRONE HCL 15 MG PO TABS
7.5000 mg | ORAL_TABLET | Freq: Two times a day (BID) | ORAL | 3 refills | Status: DC
Start: 2020-02-07 — End: 2021-03-07

## 2020-02-07 MED ORDER — ATORVASTATIN CALCIUM 10 MG PO TABS
10.0000 mg | ORAL_TABLET | Freq: Every day | ORAL | 3 refills | Status: DC
Start: 2020-02-07 — End: 2021-03-07

## 2020-02-07 MED ORDER — DULOXETINE HCL 60 MG PO CPEP
ORAL_CAPSULE | ORAL | 3 refills | Status: DC
Start: 2020-02-07 — End: 2021-03-07

## 2020-02-07 NOTE — Assessment & Plan Note (Signed)
Overall stable Continues pregabalin and prn tramadol as well as cymbalta Enc low impact exercise

## 2020-02-07 NOTE — Assessment & Plan Note (Signed)
Lab Results  Component Value Date   HGBA1C 5.4 01/31/2020   Stable disc imp of low glycemic diet and wt loss to prevent DM2

## 2020-02-07 NOTE — Assessment & Plan Note (Signed)
Disc goals for lipids and reasons to control them Rev last labs with pt Rev low sat fat diet in detail Well controlled wth atorvastatin 10 mg daily an diet

## 2020-02-07 NOTE — Progress Notes (Signed)
Subjective:    Patient ID: [name] [name], female    DOB: 1950-08-16, 69 y.o.   MRN: [id number]  This visit occurred during the SARS-CoV-2 public health emergency.  Safety protocols were in place, including screening questions prior to the visit, additional usage of staff PPE, and extensive cleaning of exam room while observing appropriate contact time as indicated for disinfecting solutions.    HPI Here for health maintenance exam and to review chronic medical problems    Wt Readings from Last 3 Encounters:  02/07/20 149 lb 7 oz (67.8 kg)  12/26/19 158 lb 8 oz (71.9 kg)  04/05/19 154 lb 7 oz (70.1 kg)  is busy  Not eating less  More active overall  25.25 kg/m   Doing fine  Has been busy  Taking fair care of herself   She had amw on 11/30 Noted mammogram and dexa are declined and cologuard due   Flu shot-today   covid vaccine-declines  Zoster status - declines   cologuard 10/18 -attempted but sample could not be processed   Mammogram 10/19-declines Self breast exam - no lumps   dexa 4/18 -osteopenia (declines another)  Falls none  Fractures- none  Supplements - takes vitamin D (? How much-thinks 1000 iu daily) Exercise -walking when able to get out/weather  Runs behind great grandchild (11 mo)   Smoking status - smokes less than 1/2 ppd  Not interested in quitting  No breathing problems  Not interested in lung cancer screening   BP Readings from Last 3 Encounters:  02/07/20 128/76  12/26/19 90/60  04/05/19 128/80   Pulse Readings from Last 3 Encounters:  02/07/20 (!) 59  12/26/19 73  04/05/19 77   Hyperlipidemia  Lab Results  Component Value Date   CHOL 182 01/31/2020   CHOL 178 11/30/2018   CHOL 224 (H) 04/28/2016   Lab Results  Component Value Date   HDL 78.10 01/31/2020   HDL 85.00 11/30/2018   HDL 89.80 04/28/2016   Lab Results  Component Value Date   LDLCALC 89 01/31/2020   LDLCALC 83 11/30/2018   LDLCALC 123 (H) 04/28/2016   Lab  Results  Component Value Date   TRIG 77.0 01/31/2020   TRIG 50.0 11/30/2018   TRIG 55.0 04/28/2016   Lab Results  Component Value Date   CHOLHDL 2 01/31/2020   CHOLHDL 2 11/30/2018   CHOLHDL 2 04/28/2016   Lab Results  Component Value Date   LDLDIRECT 127.3 01/22/2011   LDLDIRECT 130.0 01/29/2007   Taking atorvastatin 10 mg daily  Eating fruits/veggies   Healthy    H/o elevated TSH Lab Results  Component Value Date   TSH 5.28 (H) 01/31/2020   FT4 is nl at 0.72  Feels about the same    Past elevated glucose Lab Results  Component Value Date   HGBA1C 5.4 01/31/2020    Mood  Takes buspar   Lab Results  Component Value Date   CREATININE 0.71 01/31/2020   BUN 11 01/31/2020   NA 140 01/31/2020   K 5.3 (H) 01/31/2020   CL 104 01/31/2020   CO2 32 01/31/2020  K is often slt high  Lab Results  Component Value Date   ALT 11 01/31/2020   AST 15 01/31/2020   ALKPHOS 97 01/31/2020   BILITOT 0.4 01/31/2020     Patient Active Problem List   Diagnosis Date Noted  . Elevated glucose level 01/02/2019  . Elevated TSH 11/30/2018  . Grief reaction 11/24/2017  .  Multiple joint pain 09/16/2016  . Osteopenia 06/15/2016  . Screening mammogram, encounter for 04/28/2016  . Medicare annual wellness visit, initial 01/02/2015  . Colon cancer screening 05/31/2013  . Hyperkalemia 11/30/2012  . Retinopathy, bilateral 10/05/2012  . Hyperlipidemia, mild 10/05/2012  . Adverse effects of medication 03/24/2012  . Routine general medical examination at a health care facility 01/22/2011  . Generalized osteoarthritis of hand 07/06/2009  . SEBACEOUS CYST, NECK 07/13/2007  . SLEEP DISORDER 01/18/2007  . POSTHERPETIC NEURALGIA 12/15/2006  . TOBACCO ABUSE 12/15/2006  . CARPAL TUNNEL SYNDROME 12/15/2006  . Osteoarthritis 12/15/2006  . Fibromyalgia 12/15/2006   Past Medical History:  Diagnosis Date  . Arthritis    OA  . Carpal tunnel syndrome   . Depression   . Endometriosis    . Fibromyalgia   . Post herpetic neuralgia   . Sleep disorder   . Tobacco abuse    Past Surgical History:  Procedure Laterality Date  . ABDOMINAL HYSTERECTOMY  1992   total endometriosis  . APPENDECTOMY  1992  . bladder tack    . CATARACT EXTRACTION W/PHACO Right 03/25/2018   Procedure: CATARACT EXTRACTION PHACO AND INTRAOCULAR LENS PLACEMENT ([place]) RIGHT;  Surgeon: Marchia Meiers, MD;  Location: [facility] ORS;  Service: Ophthalmology;  Laterality: Right;  [place]  01:11 CDE 8.02 Fluid pack lot # [id number]  . CATARACT EXTRACTION W/PHACO Left 04/22/2018   Procedure: CATARACT EXTRACTION PHACO AND INTRAOCULAR LENS PLACEMENT ([place]) Left Eye;  Surgeon: Marchia Meiers, MD;  Location: [facility] ORS;  Service: Ophthalmology;  Laterality: Left;  [place]  01:02 CDE 8.11 Fluid pack lot # [id number]  . FOOT SURGERY Right 04/2016   [facility] in [place] History   Tobacco Use  . Smoking status: Current Every Day Smoker    Packs/day: 0.30  . Smokeless tobacco: Never Used  . Tobacco comment: less than 1/2 a pack  Vaping Use  . Vaping Use: Never used  Substance Use Topics  . Alcohol use: No    Alcohol/week: 0.0 standard drinks  . Drug use: No   Family History  Problem Relation Age of Onset  . Heart disease Mother        MI  . Heart disease Father        MI  . Arthritis Sister        crippling arthritis  . Arthritis Brother        crippling arthritis   Allergies  Allergen Reactions  . Acetaminophen Other (See Comments)    GI upset   Current Outpatient Medications on File Prior to Visit  Medication Sig Dispense Refill  . aspirin 81 MG tablet Take 81 mg by mouth daily.      . Melatonin 10 MG CAPS Take 20 mg by mouth at bedtime.    . naproxen sodium (ALEVE) 220 MG tablet Take 440 mg by mouth daily as needed (headaches).    . pregabalin (LYRICA) 75 MG capsule TAKE 1 CAPSULE BY MOUTH TWICE A DAY 180 capsule 0  . traMADol (ULTRAM) 50 MG tablet TAKE 1 TABLET (50 MG TOTAL) BY MOUTH EVERY 8  (EIGHT) HOURS AS NEEDED. 90 tablet 3   No current facility-administered medications on file prior to visit.      Review of Systems  Constitutional: Negative for activity change, appetite change, fatigue, fever and unexpected weight change.  HENT: Negative for congestion, ear pain, rhinorrhea, sinus pressure and sore throat.   Eyes: Negative for pain, redness and visual disturbance.  Respiratory: Negative  for cough, shortness of breath, wheezing and stridor.   Cardiovascular: Negative for chest pain and palpitations.  Gastrointestinal: Negative for abdominal pain, blood in stool, constipation and diarrhea.  Endocrine: Negative for polydipsia and polyuria.  Genitourinary: Negative for dysuria, frequency and urgency.  Musculoskeletal: Positive for arthralgias and back pain. Negative for myalgias.  Skin: Negative for pallor and rash.  Allergic/Immunologic: Negative for environmental allergies.  Neurological: Negative for dizziness, syncope and headaches.  Hematological: Negative for adenopathy. Does not bruise/bleed easily.  Psychiatric/Behavioral: Negative for decreased concentration and dysphoric mood. The patient is not nervous/anxious.        Objective:   Physical Exam Constitutional:      General: She is not in acute distress.    Appearance: Normal appearance. She is well-developed and normal weight. She is not ill-appearing or diaphoretic.  HENT:     Head: Normocephalic and atraumatic.     Right Ear: Tympanic membrane, ear canal and external ear normal.     Left Ear: Tympanic membrane, ear canal and external ear normal.     Nose: Nose normal. No congestion.     Mouth/Throat:     Mouth: Mucous membranes are moist.     Pharynx: Oropharynx is clear. No posterior oropharyngeal erythema.  Eyes:     General: No scleral icterus.    Extraocular Movements: Extraocular movements intact.     Conjunctiva/sclera: Conjunctivae normal.     Pupils: Pupils are equal, round, and reactive to  light.  Neck:     Thyroid: No thyromegaly.     Vascular: No carotid bruit or JVD.  Cardiovascular:     Rate and Rhythm: Normal rate and regular rhythm.     Pulses: Normal pulses.     Heart sounds: Normal heart sounds. No gallop.   Pulmonary:     Effort: Pulmonary effort is normal. No respiratory distress.     Breath sounds: Normal breath sounds. No wheezing.     Comments: Diffusely distant bs  Chest:     Chest wall: No tenderness.  Abdominal:     General: Bowel sounds are normal. There is no distension or abdominal bruit.     Palpations: Abdomen is soft. There is no mass.     Tenderness: There is no abdominal tenderness.     Hernia: No hernia is present.  Genitourinary:    Comments: Breast exam: No mass, nodules, thickening, tenderness, bulging, retraction, inflamation, nipple discharge or skin changes noted.  No axillary or clavicular LA.     Musculoskeletal:        General: No tenderness. Normal range of motion.     Cervical back: Normal range of motion and neck supple. No rigidity. No muscular tenderness.     Right lower leg: No edema.     Left lower leg: No edema.     Comments: Mild kyphosis   Lymphadenopathy:     Cervical: No cervical adenopathy.  Skin:    General: Skin is warm and dry.     Coloration: Skin is not pale.     Findings: No erythema or rash.     Comments: Solar lentigines diffusely Some sks   Neurological:     Mental Status: She is alert. Mental status is at baseline.     Cranial Nerves: No cranial nerve deficit.     Motor: No abnormal muscle tone.     Coordination: Coordination normal.     Gait: Gait normal.     Deep Tendon Reflexes: Reflexes are normal and symmetric.  Reflexes normal.  Psychiatric:        Mood and Affect: Mood normal.        Cognition and Memory: Cognition and memory normal.           Assessment & Plan:   Problem List Items Addressed This Visit      Musculoskeletal and Integument   Osteopenia    Last dexa 2018 and declines  another No falls or fx Continues to smoke which is a risk factor for OP Enc exercise   Disc need for calcium/ vitamin D/ wt bearing exercise and bone density test every 2 y to monitor Disc safety/ fracture risk in detail          Other   TOBACCO ABUSE    Disc in detail risks of smoking and possible outcomes including copd, vascular/ heart disease, cancer , respiratory and sinus infections  Pt voices understanding Pt is not interested in quitting at this time unfortunately       Fibromyalgia    Overall stable Continues pregabalin and prn tramadol as well as cymbalta Enc low impact exercise       Relevant Medications   DULoxetine (CYMBALTA) 60 MG capsule   Routine general medical examination at a health care facility - Primary    Reviewed health habits including diet and exercise and skin cancer prevention Reviewed appropriate screening tests for age  Also reviewed health mt list, fam hx and immunization status , as well as social and family history   Labs rev  amw rev  Pt declines most health mt including covid and zoster vaccines (questions answered), mammogram, colon cancer screening and bone density test  Not interested in smoking cessation or CT for lung cancer screening  No falls or fx, taking vit D  Flu shot given today        Hyperlipidemia, mild    Disc goals for lipids and reasons to control them Rev last labs with pt Rev low sat fat diet in detail Well controlled wth atorvastatin 10 mg daily an diet        Relevant Medications   atorvastatin (LIPITOR) 10 MG tablet   Elevated TSH    Lab Results  Component Value Date   TSH 5.28 (H) 01/31/2020   FT4 still in the nl range at 0.72 Continue to monitor  Disc sympt of hypothyroidism to watch for (fatigue and hair/skin changes)        Elevated glucose level    Lab Results  Component Value Date   HGBA1C 5.4 01/31/2020   Stable disc imp of low glycemic diet and wt loss to prevent DM2        Other  Visit Diagnoses    Need for influenza vaccination       Relevant Orders   Flu Vaccine QUAD High Dose(Fluad) (Completed)

## 2020-02-07 NOTE — Assessment & Plan Note (Signed)
Disc in detail risks of smoking and possible outcomes including copd, vascular/ heart disease, cancer , respiratory and sinus infections  Pt voices understanding Pt is not interested in quitting at this time unfortunately

## 2020-02-07 NOTE — Assessment & Plan Note (Signed)
Reviewed health habits including diet and exercise and skin cancer prevention Reviewed appropriate screening tests for age  Also reviewed health mt list, fam hx and immunization status , as well as social and family history   Labs rev  amw rev  Pt declines most health mt including covid and zoster vaccines (questions answered), mammogram, colon cancer screening and bone density test  Not interested in smoking cessation or CT for lung cancer screening  No falls or fx, taking vit D  Flu shot given today

## 2020-02-07 NOTE — Assessment & Plan Note (Signed)
Last dexa 2018 and declines another No falls or fx Continues to smoke which is a risk factor for OP Enc exercise   Disc need for calcium/ vitamin D/ wt bearing exercise and bone density test every 2 y to monitor Disc safety/ fracture risk in detail

## 2020-02-07 NOTE — Patient Instructions (Addendum)
Flu shot today   Take care of yourself  Stay active   If you become interested in mammogram or bone density test or other cancer screening let us know   If you need help quitting smoking in the future let us know

## 2020-02-07 NOTE — Assessment & Plan Note (Signed)
Lab Results  Component Value Date   TSH 5.28 (H) 01/31/2020   FT4 still in the nl range at 0.72 Continue to monitor  Disc sympt of hypothyroidism to watch for (fatigue and hair/skin changes)

## 2020-05-08 ENCOUNTER — Other Ambulatory Visit: Payer: Self-pay | Admitting: Family Medicine

## 2020-05-09 NOTE — Telephone Encounter (Signed)
Name of Medication: [place] Name of Pharmacy: [facility] or Written Date and Quantity: 02/02/20 #180 caps with 0 refills Last Office Visit and Type: CPE on 02/07/20 Next Office Visit and Type: none scheduled

## 2020-08-29 DIAGNOSIS — H0015 Chalazion left lower eyelid: Secondary | ICD-10-CM | POA: Diagnosis not present

## 2020-12-05 ENCOUNTER — Other Ambulatory Visit: Payer: Self-pay | Admitting: Family Medicine

## 2020-12-06 NOTE — Telephone Encounter (Signed)
Name of Medication: Tramadol Name of Pharmacy: [facility] or Written Date and Quantity: 01/25/20 #90 tab with 3 refills Last Office Visit and Type: CPE on 02/07/20 Next Office Visit and Type: none scheduled

## 2020-12-17 ENCOUNTER — Other Ambulatory Visit: Payer: Self-pay

## 2020-12-17 ENCOUNTER — Telehealth: Payer: Self-pay | Admitting: *Deleted

## 2020-12-17 ENCOUNTER — Encounter: Payer: Self-pay | Admitting: Family Medicine

## 2020-12-17 ENCOUNTER — Ambulatory Visit (INDEPENDENT_AMBULATORY_CARE_PROVIDER_SITE_OTHER): Payer: Medicare Other | Admitting: Family Medicine

## 2020-12-17 VITALS — BP 150/70 | HR 68 | Temp 97.9°F | Ht 64.5 in | Wt 162.2 lb

## 2020-12-17 DIAGNOSIS — N3000 Acute cystitis without hematuria: Secondary | ICD-10-CM | POA: Diagnosis not present

## 2020-12-17 DIAGNOSIS — R300 Dysuria: Secondary | ICD-10-CM | POA: Diagnosis not present

## 2020-12-17 LAB — POC URINALSYSI DIPSTICK (AUTOMATED)
Bilirubin, UA: NEGATIVE
Blood, UA: NEGATIVE
Glucose, UA: NEGATIVE
Ketones, UA: NEGATIVE
Nitrite, UA: NEGATIVE
Protein, UA: NEGATIVE
Spec Grav, UA: 1.01 (ref 1.010–1.025)
Urobilinogen, UA: 0.2 E.U./dL
pH, UA: 6 (ref 5.0–8.0)

## 2020-12-17 MED ORDER — CEPHALEXIN 500 MG PO CAPS: 500.0000 mg | ORAL_CAPSULE | Freq: Two times a day (BID) | ORAL | 0 refills | Status: DC

## 2020-12-17 NOTE — Progress Notes (Signed)
Subjective:    Patient ID: [name] [name], female    DOB: 1950-10-04, 70 y.o.   MRN: [id number]  This visit occurred during the SARS-CoV-2 public health emergency.  Safety protocols were in place, including screening questions prior to the visit, additional usage of staff PPE, and extensive cleaning of exam room while observing appropriate contact time as indicated for disinfecting solutions.   HPI Pt presents with urinary symptoms   Wt Readings from Last 3 Encounters:  12/17/20 162 lb 4 oz (73.6 kg)  02/07/20 149 lb 7 oz (67.8 kg)  12/26/19 158 lb 8 oz (71.9 kg)   27.42 kg/m  Burning with urination  Started about a week ago  Took azo with cranberry   Frequency and urgency No incontinence  No blood in urine   No nausea  No fever or body aches   No new back pain   No confusion    Drinking lots of water   Last uti was a year ago   Results for orders placed or performed in visit on 12/17/20  POCT Urinalysis Dipstick (Automated)  Result Value Ref Range   Color, UA Yellow    Clarity, UA Cloudy    Glucose, UA Negative Negative   Bilirubin, UA Negative    Ketones, UA Negative    Spec Grav, UA 1.010 1.010 - 1.025   Blood, UA Negative    pH, UA 6.0 5.0 - 8.0   Protein, UA Negative Negative   Urobilinogen, UA 0.2 0.2 or 1.0 E.U./dL   Nitrite, UA Negative    Leukocytes, UA Large (3+) (A) Negative    Patient Active Problem List   Diagnosis Date Noted   Acute cystitis 12/17/2020   Elevated glucose level 01/02/2019   Elevated TSH 11/30/2018   Grief reaction 11/24/2017   Multiple joint pain 09/16/2016   Osteopenia 06/15/2016   Screening mammogram, encounter for 04/28/2016   Medicare annual wellness visit, initial 01/02/2015   Colon cancer screening 05/31/2013   Hyperkalemia 11/30/2012   Retinopathy, bilateral 10/05/2012   Hyperlipidemia, mild 10/05/2012   Adverse effects of medication 03/24/2012   Routine general medical examination at a health care facility  01/22/2011   Generalized osteoarthritis of hand 07/06/2009   SEBACEOUS CYST, NECK 07/13/2007   SLEEP DISORDER 01/18/2007   POSTHERPETIC NEURALGIA 12/15/2006   TOBACCO ABUSE 12/15/2006   CARPAL TUNNEL SYNDROME 12/15/2006   Osteoarthritis 12/15/2006   Fibromyalgia 12/15/2006   Past Medical History:  Diagnosis Date   Arthritis    OA   Carpal tunnel syndrome    Depression    Endometriosis    Fibromyalgia    Post herpetic neuralgia    Sleep disorder    Tobacco abuse    Past Surgical History:  Procedure Laterality Date   ABDOMINAL HYSTERECTOMY  1992   total endometriosis   APPENDECTOMY  1992   bladder tack     CATARACT EXTRACTION W/PHACO Right 03/25/2018   Procedure: CATARACT EXTRACTION PHACO AND INTRAOCULAR LENS PLACEMENT ([place]) RIGHT;  Surgeon: Marchia Meiers, MD;  Location: [facility] ORS;  Service: Ophthalmology;  Laterality: Right;  [place]  01:11 CDE 8.02 Fluid pack lot # [id number]   CATARACT EXTRACTION W/PHACO Left 04/22/2018   Procedure: CATARACT EXTRACTION PHACO AND INTRAOCULAR LENS PLACEMENT ([place]) Left Eye;  Surgeon: Marchia Meiers, MD;  Location: [facility] ORS;  Service: Ophthalmology;  Laterality: Left;  [place]  01:02 CDE 8.11 Fluid pack lot # [id number]   FOOT SURGERY Right 04/2016   [facility] in [place]  Social History   Tobacco Use   Smoking status: Every Day    Packs/day: 0.30    Types: Cigarettes   Smokeless tobacco: Never   Tobacco comments:    less than 1/2 a pack  Vaping Use   Vaping Use: Never used  Substance Use Topics   Alcohol use: No    Alcohol/week: 0.0 standard drinks   Drug use: No   Family History  Problem Relation Age of Onset   Heart disease Mother        MI   Heart disease Father        MI   Arthritis Sister        crippling arthritis   Arthritis Brother        crippling arthritis   Allergies  Allergen Reactions   Acetaminophen Other (See Comments)    GI upset   Current Outpatient Medications on File Prior to Visit  Medication Sig  Dispense Refill   aspirin 81 MG tablet Take 81 mg by mouth daily.       atorvastatin (LIPITOR) 10 MG tablet Take 1 tablet (10 mg total) by mouth daily. 90 tablet 3   busPIRone (BUSPAR) 15 MG tablet Take 0.5 tablets (7.5 mg total) by mouth 2 (two) times daily. 90 tablet 3   DULoxetine (CYMBALTA) 60 MG capsule TAKE 1 CAPSULE BY MOUTH EVERY DAY 90 capsule 3   Melatonin 10 MG CAPS Take 20 mg by mouth at bedtime.     naproxen sodium (ALEVE) 220 MG tablet Take 440 mg by mouth daily as needed (headaches).     pregabalin (LYRICA) 75 MG capsule TAKE 1 CAPSULE BY MOUTH TWICE A DAY 180 capsule 1   traMADol (ULTRAM) 50 MG tablet TAKE 1 TABLET BY MOUTH EVERY 8 HOURS AS NEEDED. 90 tablet 3   No current facility-administered medications on file prior to visit.    Review of Systems  Constitutional:  Negative for activity change, appetite change, fatigue, fever and unexpected weight change.  HENT:  Negative for congestion, ear pain, rhinorrhea, sinus pressure and sore throat.   Eyes:  Negative for pain, redness and visual disturbance.  Respiratory:  Negative for cough, shortness of breath and wheezing.   Cardiovascular:  Negative for chest pain and palpitations.  Gastrointestinal:  Negative for abdominal pain, blood in stool, constipation and diarrhea.  Endocrine: Negative for polydipsia and polyuria.  Genitourinary:  Positive for dysuria, frequency and urgency. Negative for decreased urine volume, flank pain, pelvic pain and vaginal discharge.  Musculoskeletal:  Negative for arthralgias, back pain and myalgias.  Skin:  Negative for pallor and rash.  Allergic/Immunologic: Negative for environmental allergies.  Neurological:  Negative for dizziness, syncope and headaches.  Hematological:  Negative for adenopathy. Does not bruise/bleed easily.  Psychiatric/Behavioral:  Negative for decreased concentration and dysphoric mood. The patient is not nervous/anxious.       Objective:   Physical  Exam Constitutional:      General: She is not in acute distress.    Appearance: Normal appearance. She is well-developed and normal weight.  HENT:     Head: Normocephalic and atraumatic.  Eyes:     Conjunctiva/sclera: Conjunctivae normal.     Pupils: Pupils are equal, round, and reactive to light.  Cardiovascular:     Rate and Rhythm: Normal rate and regular rhythm.     Heart sounds: Normal heart sounds.  Pulmonary:     Effort: Pulmonary effort is normal.     Breath sounds: Normal breath sounds.  Abdominal:     General: Bowel sounds are normal. There is no distension.     Palpations: Abdomen is soft.     Tenderness: There is abdominal tenderness. There is no rebound.     Comments: No cva tenderness  no suprapubic tenderness or fullness  Feels urge to urinate with palpation    Musculoskeletal:     Cervical back: Normal range of motion and neck supple.     Right lower leg: No edema.     Left lower leg: No edema.  Lymphadenopathy:     Cervical: No cervical adenopathy.  Skin:    Findings: No rash.  Neurological:     Mental Status: She is alert.  Psychiatric:        Mood and Affect: Mood normal.          Assessment & Plan:   Problem List Items Addressed This Visit       Genitourinary   Acute cystitis - Primary    With dysuria and frequency  Disc imp of water intake Keflex px  Handout given  inst to call if symptoms suddenly worsen  Pending culture       Relevant Orders   Urine Culture

## 2020-12-17 NOTE — Telephone Encounter (Signed)
Patient did not get a call back because her number was recorded wrong. Patient's number is [phone] instead of [phone].  Called and spoke to patient and was advised that she is still having burning with urination. Patient scheduled for an office visit today 12/17/20 at 2:00 with Dr. Glori Bickers at [facility]. Patient had a negative covid screening.

## 2020-12-17 NOTE — Telephone Encounter (Signed)
PLEASE NOTE: All timestamps contained within this report are represented as [place] Standard Time. CONFIDENTIALTY NOTICE: This fax transmission is intended only for the addressee. It contains information that is legally privileged, confidential or otherwise protected from use or disclosure. If you are not the intended recipient, you are strictly prohibited from reviewing, disclosing, copying using or disseminating any of this information or taking any action in reliance on or regarding this information. If you have received this fax in error, please notify us immediately by telephone so that we can arrange for its return to [place]. Phone: [phone], Toll-Free: [phone], Fax: [phone] Page: 1 of 1 Call Id: [id number] [place] RECORD [facility] Patient Name: [name] Gender: Female DOB: [birth year] Age: 70 Y 68 M 19 D Return Phone Number: [phone] (Primary) Address: City/ State/ Zip: [place] [address] Client [facility] Night - Client Client Site [place] [facility] [place], Roque Lias - MD Contact Type Call Who Is Calling Patient / Member / Family / Caregiver Call Type Triage / Clinical Relationship To Patient Self Return Phone Number [phone] (Primary) Chief Complaint Urination Pain Reason for Call Symptomatic / Request for [name] states she has burning when urinating. Location confirmed. Translation No Disp. Time Eilene Ghazi Time) Disposition Final User 12/15/2020 4:36:52 PM Attempt made - line busy [place], [place], [facility] 12/15/2020 4:46:15 PM FINAL ATTEMPT MADE - no message left [place], RN, Beth 12/15/2020 4:47:09 PM FINAL ATTEMPT MADE - no message left Solocinski, RN, [place] 12/15/2020 4:47:15 PM Send to RN Final Attempt Christell Faith, RN, Beth 12/15/2020 8:19:42 PM Attempt made - line busy Clydene Laming, RN,  [facility] 12/15/2020 8:28:50 PM FINAL ATTEMPT MADE - no message left Yes Clydene Laming, RN, Y[place]y [place]

## 2020-12-17 NOTE — Assessment & Plan Note (Signed)
With dysuria and frequency  Disc imp of water intake Keflex px  Handout given  inst to call if symptoms suddenly worsen  Pending culture

## 2020-12-17 NOTE — Patient Instructions (Signed)
Take keflex for your uti  Drink lots of water  If symptoms suddenly worsen or change let us know  We will contact your with culture result

## 2020-12-17 NOTE — Telephone Encounter (Signed)
Aware, will see her then 

## 2020-12-19 LAB — URINE CULTURE
MICRO NUMBER:: 12511326
SPECIMEN QUALITY:: ADEQUATE

## 2021-01-28 NOTE — Progress Notes (Signed)
Subjective:   [name] [name] is a 70 y.o. female who presents for Medicare Annual (Subsequent) preventive examination.  I connected with Bayley [place] today by telephone and verified that I am speaking with the correct person using two identifiers. Location patient: home Location provider: work Persons participating in the virtual visit: patient, Marine scientist.    I discussed the limitations, risks, security and privacy concerns of performing an evaluation and management service by telephone and the availability of in person appointments. I also discussed with the patient that there may be a patient responsible charge related to this service. The patient expressed understanding and verbally consented to this telephonic visit.    Interactive audio and video telecommunications were attempted between this provider and patient, however failed, due to patient having technical difficulties OR patient did not have access to video capability.  We continued and completed visit with audio only.  Some vital signs may be absent or patient reported.   Time Spent with patient on telephone encounter: 20 minutes  Review of Systems     Cardiac Risk Factors include: advanced age (>20men, >51 women);hypertension     Objective:    Today's Vitals   02/01/21 1029  Weight: 162 lb (73.5 kg)  Height: 5\' 4"  (1.626 m)   Body mass index is 27.81 kg/m.  Advanced Directives 02/01/2021 01/31/2020 11/24/2018 03/25/2018 11/24/2017 09/12/2016  Does Patient Have a Medical Advance Directive? Yes Yes Yes Yes Yes Yes  Type of Paramedic of [place];Living will [place];Living will [place];Living will [place];Living will [place];Living will [place];Living will  Does patient want to make changes to medical advance directive? Yes (MAU/Ambulatory/Procedural Areas - Information given) - - No - Patient  declined - -  Copy of [place] in Chart? - No - copy requested No - copy requested No - copy requested No - copy requested No - copy requested    Current Medications (verified) Outpatient Encounter Medications as of 02/01/2021  Medication Sig   aspirin 81 MG tablet Take 81 mg by mouth daily.     atorvastatin (LIPITOR) 10 MG tablet Take 1 tablet (10 mg total) by mouth daily.   busPIRone (BUSPAR) 15 MG tablet Take 0.5 tablets (7.5 mg total) by mouth 2 (two) times daily.   cephALEXin (KEFLEX) 500 MG capsule Take 1 capsule (500 mg total) by mouth 2 (two) times daily.   DULoxetine (CYMBALTA) 60 MG capsule TAKE 1 CAPSULE BY MOUTH EVERY DAY   Melatonin 10 MG CAPS Take 20 mg by mouth at bedtime.   naproxen sodium (ALEVE) 220 MG tablet Take 440 mg by mouth daily as needed (headaches).   pregabalin (LYRICA) 75 MG capsule TAKE 1 CAPSULE BY MOUTH TWICE A DAY   traMADol (ULTRAM) 50 MG tablet TAKE 1 TABLET BY MOUTH EVERY 8 HOURS AS NEEDED.   No facility-administered encounter medications on file as of 02/01/2021.    Allergies (verified) Acetaminophen   History: Past Medical History:  Diagnosis Date   Arthritis    OA   Carpal tunnel syndrome    Depression    Endometriosis    Fibromyalgia    Post herpetic neuralgia    Sleep disorder    Tobacco abuse    Past Surgical History:  Procedure Laterality Date   ABDOMINAL HYSTERECTOMY  1992   total endometriosis   APPENDECTOMY  1992   bladder tack     CATARACT EXTRACTION  W/PHACO Right 03/25/2018   Procedure: CATARACT EXTRACTION PHACO AND INTRAOCULAR LENS PLACEMENT (IOC) RIGHT;  Surgeon: Marchia Meiers, MD;  Location: [facility] ORS;  Service: Ophthalmology;  Laterality: Right;  [place]  01:11 CDE 8.02 Fluid pack lot # [id number]   CATARACT EXTRACTION W/PHACO Left 04/22/2018   Procedure: CATARACT EXTRACTION PHACO AND INTRAOCULAR LENS PLACEMENT ([place]) Left Eye;  Surgeon: Marchia Meiers, MD;  Location: [facility] ORS;  Service: Ophthalmology;   Laterality: Left;  [place]  01:02 CDE 8.11 Fluid pack lot # [id number]   FOOT SURGERY Right 04/2016   [facility] in [place]   Family History  Problem Relation Age of Onset   Heart disease Mother        MI   Heart disease Father        MI   Arthritis Sister        crippling arthritis   Arthritis Brother        crippling arthritis   Social History   Socioeconomic History   Marital status: Widowed    Spouse name: Not on file   Number of children: Not on file   Years of education: Not on file   Highest education level: Not on file  Occupational History   Not on file  Tobacco Use   Smoking status: Every Day    Packs/day: 0.30    Types: Cigarettes   Smokeless tobacco: Never   Tobacco comments:    less than 1/2 a pack  Vaping Use   Vaping Use: Never used  Substance and Sexual Activity   Alcohol use: No    Alcohol/week: 0.0 standard drinks   Drug use: No   Sexual activity: Not Currently  Other Topics Concern   Not on file  Social History Narrative   Not on file   Social Determinants of Health   Financial Resource Strain: Low Risk    Difficulty of Paying Living Expenses: Not hard at all  Food Insecurity: No Food Insecurity   Worried About Charity fundraiser in the Last Year: Never true   Ran Out of Food in the Last Year: Never true  Transportation Needs: No Transportation Needs   Lack of Transportation (Medical): No   Lack of Transportation (Non-Medical): No  Physical Activity: Inactive   Days of Exercise per Week: 0 days   Minutes of Exercise per Session: 0 min  Stress: No Stress Concern Present   Feeling of Stress : Not at all  Social Connections: Socially Integrated   Frequency of Communication with Friends and Family: More than three times a week   Frequency of Social Gatherings with Friends and Family: More than three times a week   Attends Religious Services: More than 4 times per year   Active Member of [employer] or Organizations: Yes   Attends Programme researcher, broadcasting/film/video: More than 4 times per year   Marital Status: Married    Tobacco Counseling Ready to quit: Not Answered Counseling given: Not Answered Tobacco comments: less than 1/2 a pack   Clinical Intake:  Pre-visit preparation completed: Yes  Pain : No/denies pain     BMI - recorded: 27.43 Nutritional Status: BMI 25 -29 Overweight Nutritional Risks: None Diabetes: No  How often do you need to have someone help you when you read instructions, pamphlets, or other written materials from your doctor or pharmacy?: 1 - Never  Diabetic?No  Interpreter Needed?: No  Information entered by :: Orrin Brigham LPN   Activities of Daily Living In your  present state of health, do you have any difficulty performing the following activities: 02/01/2021  Hearing? N  Vision? N  Difficulty concentrating or making decisions? N  Walking or climbing stairs? N  Dressing or bathing? N  Doing errands, shopping? N  Preparing Food and eating ? N  Using the Toilet? N  In the past six months, have you accidently leaked urine? N  Do you have problems with loss of bowel control? N  Managing your Medications? N  Managing your Finances? N  Housekeeping or managing your Housekeeping? N  Some recent data might be hidden    Patient Care Team: Tower, [name], MD as PCP - General  Indicate any recent Medical Services you may have received from other than [facility] providers in the past year (date may be approximate).     Assessment:   This is a routine wellness examination for [place].  Hearing/Vision screen Hearing Screening - Comments:: No issues Vision Screening - Comments:: Last exam 2021, plans to schedule an appointment, [facility] center, wears reading glasses  Dietary issues and exercise activities discussed: Current Exercise Habits: The patient does not participate in regular exercise at present   Goals Addressed             This Visit's Progress    Patient Stated        Would like to walk more and drink more water       Depression Screen PHQ 2/9 Scores 02/01/2021 01/31/2020 11/24/2018 11/24/2017 09/12/2016 01/02/2015 05/31/2013  PHQ - 2 Score 0 0 0 2 0 0 0  PHQ- 9 Score - 0 0 8 - - -    Fall Risk Fall Risk  02/01/2021 01/31/2020 11/24/2018 11/24/2017 09/12/2016  Falls in the past year? 0 0 0 No No  Number falls in past yr: 0 0 0 - -  Injury with Fall? 0 0 - - -  Risk for fall due to : No Fall Risks Medication side effect Medication side effect - -  Follow up Falls prevention discussed Falls evaluation completed;Falls prevention discussed Falls evaluation completed;Falls prevention discussed - -    FALL RISK PREVENTION PERTAINING TO THE HOME:  Any stairs in or around the home? Yes  If so, are there any without handrails? No  Home free of loose throw rugs in walkways, pet beds, electrical cords, etc? Yes  Adequate lighting in your home to reduce risk of falls? Yes   ASSISTIVE DEVICES UTILIZED TO PREVENT FALLS:  Life alert? No  Use of a cane, walker or w/c? No  Grab bars in the bathroom? Yes  Shower chair or bench in shower? Yes  Elevated toilet seat or a handicapped toilet? Yes   TIMED UP AND GO:  Was the test performed? No , visit completed over the phone.   Cognitive Function: Normal cognitive status assessed by this Nurse Health Advisor. No abnormalities found.   MMSE - Mini Mental State Exam 01/31/2020 11/24/2018 11/24/2017 09/12/2016  Orientation to time 5 5 5 5   Orientation to Place 5 5 5 5   Registration 3 3 3 3   Attention/ Calculation 5 0 0 0  Recall 3 3 3 3   Language- name 2 objects - - 0 0  Language- repeat 1 1 1 1   Language- follow 3 step command - - 3 3  Language- read & follow direction - - 0 0  Write a sentence - - 0 0  Copy design - - 0 0  Total score - - 20  20        Immunizations Immunization History  Administered Date(s) Administered   Fluad Quad(high Dose 65+) 11/30/2018, 02/07/2020   Influenza Split 01/22/2011,  03/24/2012   Influenza, High Dose Seasonal PF 11/07/2016   Influenza,inj,Quad PF,6+ Mos 11/30/2012, 12/06/2014, 11/22/2015, 11/24/2017   Pneumococcal Conjugate-13 01/02/2015   Pneumococcal Polysaccharide-23 01/22/2011, 09/12/2016   Td 05/01/1998, 03/04/2007    TDAP status: Due, Education has been provided regarding the importance of this vaccine. Advised may receive this vaccine at local pharmacy or Health Dept. Aware to provide a copy of the vaccination record if obtained from local pharmacy or Health Dept. Verbalized acceptance and understanding.  Flu Vaccine status: Due, Education has been provided regarding the importance of this vaccine. Advised may receive this vaccine at local pharmacy or Health Dept. Aware to provide a copy of the vaccination record if obtained from local pharmacy or Health Dept. Verbalized acceptance and understanding.  Pneumococcal vaccine status: Up to date  Covid-19 vaccine status: Declined, Education has been provided regarding the importance of this vaccine but patient still declined. Advised may receive this vaccine at local pharmacy or Health Dept.or vaccine clinic. Aware to provide a copy of the vaccination record if obtained from local pharmacy or Health Dept. Verbalized acceptance and understanding.  Qualifies for Shingles Vaccine? Yes   Zostavax completed No   Shingrix Completed?: No.    Education has been provided regarding the importance of this vaccine. Patient has been advised to call insurance company to determine out of pocket expense if they have not yet received this vaccine. Advised may also receive vaccine at local pharmacy or Health Dept. Verbalized acceptance and understanding.  Screening Tests Health Maintenance  Topic Date Due   Zoster Vaccines- Shingrix (1 of 2) Never done   MAMMOGRAM  12/03/2018   Fecal DNA (Cologuard)  12/02/2019   INFLUENZA VACCINE  10/01/2020   COVID-19 Vaccine (1) 02/15/2021 (Originally 10/27/1950)   TETANUS/TDAP   01/31/2024 (Originally 03/03/2017)   Pneumonia Vaccine 47+ Years old  Completed   DEXA SCAN  Completed   Hepatitis C Screening  Completed   HPV VACCINES  Aged Out    Health Maintenance  Health Maintenance Due  Topic Date Due   Zoster Vaccines- Shingrix (1 of 2) Never done   MAMMOGRAM  12/03/2018   Fecal DNA (Cologuard)  12/02/2019   INFLUENZA VACCINE  10/01/2020    Colorectal Cancer Screening: cologuard due, last completed 12/01/16. Patient declined today, advised to contact office when decided.  Mammogram Status: due, last completed 12/29/17, Patient declined today, advised to contact office when decided.   Bone Density Status: due, last completed 06/12/16, Patient declined today, advised to contact office when decided.   Lung Cancer Screening: (Low Dose CT Chest recommended if Age 75-80 years, 30 pack-year currently smoking OR have quit w/in 15years.) does qualify. , patient plans on discussing CT scan with PCP.   Additional Screening:  Hepatitis C Screening: does qualify; Completed 11/24/17  Vision Screening: Recommended annual ophthalmology exams for early detection of glaucoma and other disorders of the eye. Is the patient up to date with their annual eye exam?  No  Who is the provider or what is the name of the office in which the patient attends annual eye exams? [facility]  Dental Screening: Recommended annual dental exams for proper oral hygiene  Community Resource Referral / Chronic Care Management: CRR required this visit?  No   CCM required this visit?  No  Plan:     I have personally reviewed and noted the following in the patient's chart:   Medical and social history Use of alcohol, tobacco or illicit drugs  Current medications and supplements including opioid prescriptions.  Functional ability and status Nutritional status Physical activity Advanced directives List of other physicians Hospitalizations, surgeries, and ER visits in  previous 12 months Vitals Screenings to include cognitive, depression, and falls Referrals and appointments  In addition, I have reviewed and discussed with patient certain preventive protocols, quality metrics, and best practice recommendations. A written personalized care plan for preventive services as well as general preventive health recommendations were provided to patient.   Due to this being a telephonic visit, the after visit summary with patients personalized plan was offered to patient via mail or my-chart. Patient preferred to pick up at office at next visit.    Loma Messing, LPN   06/07/9985   Nurse Health Advisor  Nurse Notes: none

## 2021-02-01 ENCOUNTER — Ambulatory Visit (INDEPENDENT_AMBULATORY_CARE_PROVIDER_SITE_OTHER): Payer: Medicare Other

## 2021-02-01 VITALS — Ht 64.0 in | Wt 162.0 lb

## 2021-02-01 DIAGNOSIS — Z0000 Encounter for general adult medical examination without abnormal findings: Secondary | ICD-10-CM

## 2021-02-01 NOTE — Patient Instructions (Signed)
[name] [name] , Thank you for taking time to complete your Medicare Wellness Visit. I appreciate your ongoing commitment to your health goals. Please review the following plan we discussed and let me know if I can assist you in the future.   Screening recommendations/referrals: Colonoscopy: cologuard due, last completed 12/01/16, declined today, contact the office if you change your mind  Mammogram: due, last completed 12/02/17, due 12/03/18,declined today, contact the office if you change your mind   Bone Density: due , last completed 06/12/16, declined today, contact the office if you change your mind  Recommended yearly ophthalmology/optometry visit for glaucoma screening and checkup Recommended yearly dental visit for hygiene and checkup  Vaccinations: Influenza vaccine: Due-May obtain vaccine at our office or your local pharmacy. Pneumococcal vaccine: up to date Tdap vaccine: Due last completed 03/04/07 -May obtain vaccine at your local pharmacy. Shingles vaccine:  Declined today, May obtain vaccine at your local pharmacy. Covid-19: newest booster available at your local pharmacy  Advanced directives: Please bring a copy of Living Will and/or [place] for your chart.   Conditions/risks identified: see problem list  Next appointment: Follow up in one year for your annual wellness visit    Preventive Care 65 Years and Older, Female Preventive care refers to lifestyle choices and visits with your health care provider that can promote health and wellness. What does preventive care include? A yearly physical exam. This is also called an annual well check. Dental exams once or twice a year. Routine eye exams. Ask your health care provider how often you should have your eyes checked. Personal lifestyle choices, including: Daily care of your teeth and gums. Regular physical activity. Eating a healthy diet. Avoiding tobacco and drug use. Limiting alcohol use. Practicing  safe sex. Taking low-dose aspirin every day. Taking vitamin and mineral supplements as recommended by your health care provider. What happens during an annual well check? The services and screenings done by your health care provider during your annual well check will depend on your age, overall health, lifestyle risk factors, and family history of disease. Counseling  Your health care provider may ask you questions about your: Alcohol use. Tobacco use. Drug use. Emotional well-being. Home and relationship well-being. Sexual activity. Eating habits. History of falls. Memory and ability to understand (cognition). Work and work Statistician. Reproductive health. Screening  You may have the following tests or measurements: Height, weight, and BMI. Blood pressure. Lipid and cholesterol levels. These may be checked every 5 years, or more frequently if you are over 52 years old. Skin check. Lung cancer screening. You may have this screening every year starting at age 59 if you have a 30-pack-year history of smoking and currently smoke or have quit within the past 15 years. Fecal occult blood test (FOBT) of the stool. You may have this test every year starting at age 35. Flexible sigmoidoscopy or colonoscopy. You may have a sigmoidoscopy every 5 years or a colonoscopy every 10 years starting at age 81. Hepatitis C blood test. Hepatitis B blood test. Sexually transmitted disease (STD) testing. Diabetes screening. This is done by checking your blood sugar (glucose) after you have not eaten for a while (fasting). You may have this done every 1-3 years. Bone density scan. This is done to screen for osteoporosis. You may have this done starting at age 103. Mammogram. This may be done every 1-2 years. Talk to your health care provider about how often you should have regular mammograms. Talk with your  health care provider about your test results, treatment options, and if necessary, the need for more  tests. Vaccines  Your health care provider may recommend certain vaccines, such as: Influenza vaccine. This is recommended every year. Tetanus, diphtheria, and acellular pertussis (Tdap, Td) vaccine. You may need a Td booster every 10 years. Zoster vaccine. You may need this after age 75. Pneumococcal 13-valent conjugate (PCV13) vaccine. One dose is recommended after age 13. Pneumococcal polysaccharide (PPSV23) vaccine. One dose is recommended after age 77. Talk to your health care provider about which screenings and vaccines you need and how often you need them. This information is not intended to replace advice given to you by your health care provider. Make sure you discuss any questions you have with your health care provider. Document Released: 03/16/2015 Document Revised: 11/07/2015 Document Reviewed: 12/19/2014 Elsevier Interactive Patient Education  2017 [place] Prevention in the Home Falls can cause injuries. They can happen to people of all ages. There are many things you can do to make your home safe and to help prevent falls. What can I do on the outside of my home? Regularly fix the edges of walkways and driveways and fix any cracks. Remove anything that might make you trip as you walk through a door, such as a raised step or threshold. Trim any bushes or trees on the path to your home. Use bright outdoor lighting. Clear any walking paths of anything that might make someone trip, such as rocks or tools. Regularly check to see if handrails are loose or broken. Make sure that both sides of any steps have handrails. Any raised decks and porches should have guardrails on the edges. Have any leaves, snow, or ice cleared regularly. Use sand or salt on walking paths during winter. Clean up any spills in your garage right away. This includes oil or grease spills. What can I do in the bathroom? Use night lights. Install grab bars by the toilet and in the tub and shower.  Do not use towel bars as grab bars. Use non-skid mats or decals in the tub or shower. If you need to sit down in the shower, use a plastic, non-slip stool. Keep the floor dry. Clean up any water that spills on the floor as soon as it happens. Remove soap buildup in the tub or shower regularly. Attach bath mats securely with double-sided non-slip rug tape. Do not have throw rugs and other things on the floor that can make you trip. What can I do in the bedroom? Use night lights. Make sure that you have a light by your bed that is easy to reach. Do not use any sheets or blankets that are too big for your bed. They should not hang down onto the floor. Have a firm chair that has side arms. You can use this for support while you get dressed. Do not have throw rugs and other things on the floor that can make you trip. What can I do in the kitchen? Clean up any spills right away. Avoid walking on wet floors. Keep items that you use a lot in easy-to-reach places. If you need to reach something above you, use a strong step stool that has a grab bar. Keep electrical cords out of the way. Do not use floor polish or wax that makes floors slippery. If you must use wax, use non-skid floor wax. Do not have throw rugs and other things on the floor that can make you trip. What  can I do with my stairs? Do not leave any items on the stairs. Make sure that there are handrails on both sides of the stairs and use them. Fix handrails that are broken or loose. Make sure that handrails are as long as the stairways. Check any carpeting to make sure that it is firmly attached to the stairs. Fix any carpet that is loose or worn. Avoid having throw rugs at the top or bottom of the stairs. If you do have throw rugs, attach them to the floor with carpet tape. Make sure that you have a light switch at the top of the stairs and the bottom of the stairs. If you do not have them, ask someone to add them for you. What else  can I do to help prevent falls? Wear shoes that: Do not have high heels. Have rubber bottoms. Are comfortable and fit you well. Are closed at the toe. Do not wear sandals. If you use a stepladder: Make sure that it is fully opened. Do not climb a closed stepladder. Make sure that both sides of the stepladder are locked into place. Ask someone to hold it for you, if possible. Clearly mark and make sure that you can see: Any grab bars or handrails. First and last steps. Where the edge of each step is. Use tools that help you move around (mobility aids) if they are needed. These include: Canes. Walkers. Scooters. Crutches. Turn on the lights when you go into a dark area. Replace any light bulbs as soon as they burn out. Set up your furniture so you have a clear path. Avoid moving your furniture around. If any of your floors are uneven, fix them. If there are any pets around you, be aware of where they are. Review your medicines with your doctor. Some medicines can make you feel dizzy. This can increase your chance of falling. Ask your doctor what other things that you can do to help prevent falls. This information is not intended to replace advice given to you by your health care provider. Make sure you discuss any questions you have with your health care provider. Document Released: 12/14/2008 Document Revised: 07/26/2015 Document Reviewed: 03/24/2014 Elsevier Interactive Patient Education  2017 [employer].

## 2021-03-07 ENCOUNTER — Telehealth: Payer: Self-pay | Admitting: Family Medicine

## 2021-03-07 NOTE — Telephone Encounter (Signed)
Refilled once to get by until appointment

## 2021-03-07 NOTE — Telephone Encounter (Signed)
LMTCB to schedule appt

## 2021-03-07 NOTE — Telephone Encounter (Signed)
Pt is overdue for CPE (Part 2) with PCP, please schedule appt and then route back to me to refill

## 2021-03-08 ENCOUNTER — Encounter: Payer: Self-pay | Admitting: Family Medicine

## 2021-03-08 NOTE — Telephone Encounter (Signed)
2nd attempt  LMTCB to schedule cpe  Letter mailed

## 2021-03-08 NOTE — Telephone Encounter (Signed)
No additional refills will be given until pt scheduled appt

## 2021-05-31 ENCOUNTER — Other Ambulatory Visit: Payer: Self-pay | Admitting: Family Medicine

## 2021-06-07 ENCOUNTER — Other Ambulatory Visit: Payer: Self-pay | Admitting: Family Medicine

## 2021-06-10 NOTE — Telephone Encounter (Signed)
Last seen 12/17/20. Please advise ?

## 2021-06-11 ENCOUNTER — Other Ambulatory Visit: Payer: Self-pay | Admitting: Family Medicine

## 2021-06-11 NOTE — Telephone Encounter (Signed)
Name of Medication: Tramadol ?Name of Pharmacy: [facility]  ?Last Fill or Written Date and Quantity: 12/06/21 #90 tab with 3 refills ?Last Office Visit and Type: ? UTI on 12/17/20 ?Next Office Visit and Type: CPE on 06/17/21 ?

## 2021-06-17 ENCOUNTER — Ambulatory Visit (INDEPENDENT_AMBULATORY_CARE_PROVIDER_SITE_OTHER): Payer: Medicare Other | Admitting: Family Medicine

## 2021-06-17 ENCOUNTER — Encounter: Payer: Self-pay | Admitting: Family Medicine

## 2021-06-17 VITALS — BP 126/76 | HR 78 | Temp 97.6°F | Ht 64.5 in | Wt 164.1 lb

## 2021-06-17 DIAGNOSIS — M8589 Other specified disorders of bone density and structure, multiple sites: Secondary | ICD-10-CM

## 2021-06-17 DIAGNOSIS — E785 Hyperlipidemia, unspecified: Secondary | ICD-10-CM

## 2021-06-17 DIAGNOSIS — R7309 Other abnormal glucose: Secondary | ICD-10-CM

## 2021-06-17 DIAGNOSIS — Z0000 Encounter for general adult medical examination without abnormal findings: Secondary | ICD-10-CM | POA: Diagnosis not present

## 2021-06-17 DIAGNOSIS — M797 Fibromyalgia: Secondary | ICD-10-CM

## 2021-06-17 DIAGNOSIS — R7989 Other specified abnormal findings of blood chemistry: Secondary | ICD-10-CM | POA: Diagnosis not present

## 2021-06-17 DIAGNOSIS — F17200 Nicotine dependence, unspecified, uncomplicated: Secondary | ICD-10-CM

## 2021-06-17 DIAGNOSIS — E2839 Other primary ovarian failure: Secondary | ICD-10-CM

## 2021-06-17 LAB — CBC WITH DIFFERENTIAL/PLATELET
Basophils Absolute: 0.1 10*3/uL (ref 0.0–0.1)
Basophils Relative: 1.1 % (ref 0.0–3.0)
Eosinophils Absolute: 0.2 10*3/uL (ref 0.0–0.7)
Eosinophils Relative: 3.5 % (ref 0.0–5.0)
HCT: 42.8 % (ref 36.0–46.0)
Hemoglobin: 14.3 g/dL (ref 12.0–15.0)
Lymphocytes Relative: 29.1 % (ref 12.0–46.0)
Lymphs Abs: 1.8 10*3/uL (ref 0.7–4.0)
MCHC: 33.4 g/dL (ref 30.0–36.0)
MCV: 91.5 fl (ref 78.0–100.0)
Monocytes Absolute: 0.4 10*3/uL (ref 0.1–1.0)
Monocytes Relative: 6.4 % (ref 3.0–12.0)
Neutro Abs: 3.7 10*3/uL (ref 1.4–7.7)
Neutrophils Relative %: 59.9 % (ref 43.0–77.0)
Platelets: 243 10*3/uL (ref 150.0–400.0)
RBC: 4.67 Mil/uL (ref 3.87–5.11)
RDW: 14.5 % (ref 11.5–15.5)
WBC: 6.1 10*3/uL (ref 4.0–10.5)

## 2021-06-17 LAB — COMPREHENSIVE METABOLIC PANEL
ALT: 7 U/L (ref 0–35)
AST: 11 U/L (ref 0–37)
Albumin: 4.1 g/dL (ref 3.5–5.2)
Alkaline Phosphatase: 93 U/L (ref 39–117)
BUN: 10 mg/dL (ref 6–23)
CO2: 29 mEq/L (ref 19–32)
Calcium: 9.6 mg/dL (ref 8.4–10.5)
Chloride: 102 mEq/L (ref 96–112)
Creatinine, Ser: 0.7 mg/dL (ref 0.40–1.20)
GFR: 86.58 mL/min (ref >60.00)
Glucose, Bld: 95 mg/dL (ref 70–99)
Potassium: 4.9 mEq/L (ref 3.5–5.1)
Sodium: 136 mEq/L (ref 135–145)
Total Bilirubin: 0.6 mg/dL (ref 0.2–1.2)
Total Protein: 6.7 g/dL (ref 6.0–8.3)

## 2021-06-17 LAB — TSH: TSH: 6.53 u[IU]/mL — ABNORMAL HIGH (ref 0.35–5.50)

## 2021-06-17 LAB — LIPID PANEL
Cholesterol: 186 mg/dL (ref 0–200)
HDL: 87.7 mg/dL (ref >39.00)
LDL Cholesterol: 85 mg/dL (ref 0–99)
NonHDL: 98.1
Total CHOL/HDL Ratio: 2
Triglycerides: 64 mg/dL (ref 0.0–149.0)
VLDL: 12.8 mg/dL (ref 0.0–40.0)

## 2021-06-17 LAB — T4, FREE: Free T4: 0.79 ng/dL (ref 0.60–1.60)

## 2021-06-17 LAB — HEMOGLOBIN A1C: Hgb A1c MFr Bld: 5.5 % (ref 4.6–6.5)

## 2021-06-17 NOTE — Assessment & Plan Note (Addendum)
Continues to smoke about half pack a day ?Disc in detail risks of smoking and possible outcomes including copd, vascular/ heart disease, cancer , respiratory and sinus infections  ?Pt voices understanding ?Patient declines a CT protocol for lung cancer screening ? ? ?

## 2021-06-17 NOTE — Assessment & Plan Note (Signed)
Hemoglobin A1c ordered ?Encouraged a low glycemic diet ?

## 2021-06-17 NOTE — Assessment & Plan Note (Addendum)
This is fairly stable ?Patient continues Lyrica 75 mg twice daily and tramadol as needed ?Encouraged low impact exercise when able ?

## 2021-06-17 NOTE — Assessment & Plan Note (Signed)
TSH and free T4 ordered today ?No clinical changes ?

## 2021-06-17 NOTE — Patient Instructions (Addendum)
Take care of yourself  ?Think about quitting smoking  ? ?Think about using sunscreen to prevent skin cancer ? ? ?Call for your bone density appt at [facility] breast center ? ?Please call the location of your choice from the menu below to schedule your Mammogram and/or Bone Density appointment.   ? ?[place]  ? ?[facility]                ?      Phone:  [phone] ?[address]                               ?[place], [place] [address]                                                             ?Services: Traditional and 3D Mammogram, Bone Density  ? ?[facility]           ?      Phone: [phone] ?[address]                                                       ?[place], [place] [address]    ?Service: Bone Density ONLY  ? *this site does NOT perform mammograms ? ?[place]                       ? Phone:  [phone] ?[address]                                  ?[place], [place] [address]                                            ?Services:  3D Mammogram and Bone Density  ? ? ?[place] ? ?[facility]   ?Phone:  [phone]   ?[place], [place] [address]                                            ?Services: 3D Mammogram and Bone Density ? ?[place] [facility])  ?Phone:  [phone]   ?[address]                        ?  [place], [place] [address]                                              ?Services:  3D Mammogram and Bone Density ? ?

## 2021-06-17 NOTE — Assessment & Plan Note (Signed)
DEXA ordered today, last one was 2018 ?Patient smokes ?No falls or fractures ?Encouraged calcium and vitamin D D intake and exercise as tolerated ?

## 2021-06-17 NOTE — Assessment & Plan Note (Signed)
Reviewed health habits including diet and exercise and skin cancer prevention ?Reviewed appropriate screening tests for age  ?Also reviewed health mt list, fam hx and immunization status , as well as social and family history   ?HPI reviewed ?Labs ordered ?Patient declines COVID and Shingrix vaccines ?Patient declines mammograms or breast cancer screening ?Patient to chronic declines colon cancer screening of any kind ?DEXA ordered at the [facility] breast center ?No falls or fractures ?Encouraged strongly to quit smoking, she is not ready to quit ?Declines CT lung cancer screening protocol ? ?

## 2021-06-17 NOTE — Assessment & Plan Note (Signed)
Disc goals for lipids and reasons to control them ?Rev last labs with pt ?Rev low sat fat diet in detail ?Lipid panel ordered ?Taking atorvastatin 10 mg daily ?Noted family history of CAD ? ?

## 2021-06-17 NOTE — Progress Notes (Signed)
? ?Subjective:  ? ? Patient ID: [name] [name], female    DOB: 11/16/1950, 71 y.o.   MRN: [id number] ? ?HPI ?Here for health maintenance exam and to review chronic medical problems  ? ?Wt Readings from Last 3 Encounters:  ?06/17/21 164 lb 2 oz (74.4 kg)  ?02/01/21 162 lb (73.5 kg)  ?12/17/20 162 lb 4 oz (73.6 kg)  ? ?27.74 kg/m? ?Reviewed amw today  ? ?Not doing a lot lately  ?Takes care of herself  ? ?Feeling pretty much ok for her age  ? ?Pt declines most health mt  ? ?Vaccines ?Declines covid and shingrix  ? ?Mammogram -declines ?Self breast exam-no lumps  ? ?Colon cancer screen -declines /did cologuard in the past ? ?Dexa ?Last one 2018 at [place]  ?Osteopenia ?Would like to get another  ?Smoking is a factor  ?Falls: none ?Fractures :none  ? ? Smoking status : about 1/2 pack daily  ?Not ready to quit  ?No breathing changes  ? ?Declines CT screen for lung cancer screening  ? ? ?BP Readings from Last 3 Encounters:  ?06/17/21 126/76  ?12/17/20 (!) 150/70  ?02/07/20 128/76  ? ?Pulse Readings from Last 3 Encounters:  ?06/17/21 78  ?12/17/20 68  ?02/07/20 (!) 59  ? ? ? ?Elevated glucose ?Lab Results  ?Component Value Date  ? HGBA1C 5.4 01/31/2020  ? ?Due for labs  ? ?H/o elevated TSH ?Lab Results  ?Component Value Date  ? TSH 5.28 (H) 01/31/2020  ?Due for labs ? ? ?Fibromyalgia ?Lyrica 75 mg bid  ?Tramadol prn  ? ?Diet -good  ?Exercise - fair/ is active and stays on feet  ?Outside working a lot  ? ? ?Hyperlipidemia ?Lab Results  ?Component Value Date  ? CHOL 182 01/31/2020  ? HDL 78.10 01/31/2020  ? [place] 89 01/31/2020  ? LDLDIRECT 127.3 01/22/2011  ? TRIG 77.0 01/31/2020  ? CHOLHDL 2 01/31/2020  ? ?Takes atorvastatin 10 mg daily  ?Family h/o CAD ?Takes asa 81 mg also  ?The 10-year ASCVD risk score (Arnett DK, et al., 2019) is: 16.3% ?  Values used to calculate the score: ?    Age: 20 years ?    Sex: Female ?    Is Non-Hispanic African American: No ?    Diabetic: No ?    Tobacco smoker: Yes ?    Systolic Blood  Pressure: 126 mmHg ?    Is BP treated: No ?    HDL Cholesterol: 87.7 mg/dL ?    Total Cholesterol: 186 mg/dL ? ?Patient Active Problem List  ? Diagnosis Date Noted  ? Elevated glucose level 01/02/2019  ? Elevated TSH 11/30/2018  ? Grief reaction 11/24/2017  ? Multiple joint pain 09/16/2016  ? Osteopenia 06/15/2016  ? Screening mammogram, encounter for 04/28/2016  ? Estrogen deficiency 04/28/2016  ? Medicare annual wellness visit, initial 01/02/2015  ? Colon cancer screening 05/31/2013  ? Hyperkalemia 11/30/2012  ? Retinopathy, bilateral 10/05/2012  ? Hyperlipidemia, mild 10/05/2012  ? Adverse effects of medication 03/24/2012  ? Routine general medical examination at a health care facility 01/22/2011  ? Generalized osteoarthritis of hand 07/06/2009  ? SEBACEOUS CYST, NECK 07/13/2007  ? SLEEP DISORDER 01/18/2007  ? POSTHERPETIC NEURALGIA 12/15/2006  ? TOBACCO ABUSE 12/15/2006  ? CARPAL TUNNEL SYNDROME 12/15/2006  ? Osteoarthritis 12/15/2006  ? Fibromyalgia 12/15/2006  ? ?Past Medical History:  ?Diagnosis Date  ? Arthritis   ? OA  ? Carpal tunnel syndrome   ? Depression   ? Endometriosis   ?  Fibromyalgia   ? Post herpetic neuralgia   ? Sleep disorder   ? Tobacco abuse   ? ?Past Surgical History:  ?Procedure Laterality Date  ? ABDOMINAL HYSTERECTOMY  1992  ? total endometriosis  ? APPENDECTOMY  1992  ? bladder tack    ? CATARACT EXTRACTION W/PHACO Right 03/25/2018  ? Procedure: CATARACT EXTRACTION PHACO AND INTRAOCULAR LENS PLACEMENT ([place]) RIGHT;  Surgeon: Marchia Meiers, MD;  Location: [facility] ORS;  Service: Ophthalmology;  Laterality: Right;  [place]  01:11 ?CDE 8.02 ?Fluid pack lot # [id number]  ? CATARACT EXTRACTION W/PHACO Left 04/22/2018  ? Procedure: CATARACT EXTRACTION PHACO AND INTRAOCULAR LENS PLACEMENT ([place]) Left Eye;  Surgeon: Marchia Meiers, MD;  Location: [facility] ORS;  Service: Ophthalmology;  Laterality: Left;  [place]  01:02 ?CDE 8.11 ?Fluid pack lot # [id number]  ? FOOT SURGERY Right 04/2016  ? [place]   ? ?Social History  ? ?Tobacco Use  ? Smoking status: Every Day  ?  Packs/day: 0.30  ?  Types: Cigarettes  ? Smokeless tobacco: Never  ? Tobacco comments:  ?  less than 1/2 a pack  ?Vaping Use  ? Vaping Use: Never used  ?Substance Use Topics  ? Alcohol use: No  ?  Alcohol/week: 0.0 standard drinks  ? Drug use: No  ? ?Family History  ?Problem Relation Age of Onset  ? Heart disease Mother   ?     MI  ? Heart disease Father   ?     MI  ? Arthritis Sister   ?     crippling arthritis  ? Arthritis Brother   ?     crippling arthritis  ? ?Allergies  ?Allergen Reactions  ? Acetaminophen Other (See Comments)  ?  GI upset  ? ?Current Outpatient Medications on File Prior to Visit  ?Medication Sig Dispense Refill  ? aspirin 81 MG tablet Take 81 mg by mouth daily.      ? atorvastatin (LIPITOR) 10 MG tablet TAKE 1 TABLET BY MOUTH EVERY DAY 90 tablet 0  ? busPIRone (BUSPAR) 15 MG tablet TAKE 1/2 TABLET BY MOUTH TWICE A DAY 90 tablet 2  ? DULoxetine (CYMBALTA) 60 MG capsule TAKE 1 CAPSULE BY MOUTH EVERY DAY 90 capsule 2  ? Melatonin 10 MG CAPS Take 20 mg by mouth at bedtime.    ? naproxen sodium (ALEVE) 220 MG tablet Take 440 mg by mouth daily as needed (headaches).    ? pregabalin (LYRICA) 75 MG capsule TAKE 1 CAPSULE BY MOUTH TWICE A DAY 180 capsule 1  ? traMADol (ULTRAM) 50 MG tablet TAKE 1 TABLET BY MOUTH EVERY 8 HOURS AS NEEDED. NOT COVERED BY INSURANCE. 90 tablet 3  ? ?No current facility-administered medications on file prior to visit.  ?  ? ? ? ?Review of Systems  ?Constitutional:  Positive for fatigue. Negative for activity change, appetite change, fever and unexpected weight change.  ?HENT:  Negative for congestion, ear pain, rhinorrhea, sinus pressure and sore throat.   ?Eyes:  Negative for pain, redness and visual disturbance.  ?Respiratory:  Negative for cough, shortness of breath and wheezing.   ?Cardiovascular:  Negative for chest pain and palpitations.  ?Gastrointestinal:  Negative for abdominal pain, blood in stool,  constipation and diarrhea.  ?Endocrine: Negative for polydipsia and polyuria.  ?Genitourinary:  Negative for dysuria, frequency and urgency.  ?Musculoskeletal:  Positive for arthralgias, back pain and myalgias.  ?Skin:  Negative for pallor and rash.  ?Allergic/Immunologic: Negative for environmental allergies.  ?  Neurological:  Negative for dizziness, syncope and headaches.  ?Hematological:  Negative for adenopathy. Does not bruise/bleed easily.  ?Psychiatric/Behavioral:  Negative for decreased concentration and dysphoric mood. The patient is not nervous/anxious.   ? ?   ?Objective:  ? Physical Exam ?Constitutional:   ?   General: She is not in acute distress. ?   Appearance: Normal appearance. She is well-developed and normal weight. She is not ill-appearing or diaphoretic.  ?HENT:  ?   Head: Normocephalic and atraumatic.  ?   Right Ear: Tympanic membrane, ear canal and external ear normal.  ?   Left Ear: Tympanic membrane, ear canal and external ear normal.  ?   Nose: Nose normal. No congestion.  ?   Mouth/Throat:  ?   Mouth: Mucous membranes are moist.  ?   Pharynx: Oropharynx is clear. No posterior oropharyngeal erythema.  ?Eyes:  ?   General: No scleral icterus. ?   Extraocular Movements: Extraocular movements intact.  ?   Conjunctiva/sclera: Conjunctivae normal.  ?   Pupils: Pupils are equal, round, and reactive to light.  ?Neck:  ?   Thyroid: No thyromegaly.  ?   Vascular: No carotid bruit or JVD.  ?Cardiovascular:  ?   Rate and Rhythm: Normal rate and regular rhythm.  ?   Pulses: Normal pulses.  ?   Heart sounds: Normal heart sounds.  ?  No gallop.  ?Pulmonary:  ?   Effort: Pulmonary effort is normal. No respiratory distress.  ?   Breath sounds: Normal breath sounds. No wheezing.  ?   Comments: Good air exch ?Chest:  ?   Chest wall: No tenderness.  ?Abdominal:  ?   General: Bowel sounds are normal. There is no distension or abdominal bruit.  ?   Palpations: Abdomen is soft. There is no mass.  ?   Tenderness:  There is no abdominal tenderness.  ?   Hernia: No hernia is present.  ?Genitourinary: ?   Comments: Breast exam: No mass, nodules, thickening, tenderness, bulging, retraction, inflamation, nipple discha

## 2021-06-18 ENCOUNTER — Encounter: Payer: Self-pay | Admitting: *Deleted

## 2021-07-09 ENCOUNTER — Telehealth: Payer: Self-pay | Admitting: Family Medicine

## 2021-07-09 NOTE — Telephone Encounter (Signed)
I didn't call pt 

## 2021-07-09 NOTE — Telephone Encounter (Signed)
Pt missed a call from our office. Wants to speak with the nurse. ? ?Callback Number: [id number] ?

## 2021-08-14 ENCOUNTER — Encounter: Payer: Medicare Other | Admitting: Family

## 2021-08-14 ENCOUNTER — Encounter: Payer: Self-pay | Admitting: Family

## 2021-08-14 NOTE — Progress Notes (Signed)
Lifted 32 pak water on sun and right arm has a knot right at shoulder and is in pain sore to touch.taking ibuprofen     Patient decided because she could not get an xray then she was going to leave.

## 2021-08-15 ENCOUNTER — Ambulatory Visit (INDEPENDENT_AMBULATORY_CARE_PROVIDER_SITE_OTHER)
Admission: RE | Admit: 2021-08-15 | Discharge: 2021-08-15 | Disposition: A | Discharge status: Home / Self Care | Payer: Medicare Other | Source: Ambulatory Visit | Attending: Family Medicine | Admitting: Family Medicine

## 2021-08-15 ENCOUNTER — Ambulatory Visit: Payer: Medicare Other | Admitting: Family Medicine

## 2021-08-15 ENCOUNTER — Encounter: Payer: Self-pay | Admitting: Family Medicine

## 2021-08-15 VITALS — BP 112/64 | HR 73 | Temp 98.1°F | Resp 16 | Ht 64.0 in | Wt 163.0 lb

## 2021-08-15 DIAGNOSIS — M7989 Other specified soft tissue disorders: Secondary | ICD-10-CM | POA: Diagnosis not present

## 2021-08-15 DIAGNOSIS — M25511 Pain in right shoulder: Secondary | ICD-10-CM

## 2021-08-15 DIAGNOSIS — M7531 Calcific tendinitis of right shoulder: Secondary | ICD-10-CM | POA: Diagnosis not present

## 2021-08-15 DIAGNOSIS — M85821 Other specified disorders of bone density and structure, right upper arm: Secondary | ICD-10-CM | POA: Diagnosis not present

## 2021-08-15 DIAGNOSIS — M19011 Primary osteoarthritis, right shoulder: Secondary | ICD-10-CM | POA: Diagnosis not present

## 2021-08-15 DIAGNOSIS — S4991XA Unspecified injury of right shoulder and upper arm, initial encounter: Secondary | ICD-10-CM | POA: Diagnosis not present

## 2021-08-15 NOTE — Assessment & Plan Note (Addendum)
No sign of fracture on x-ray.  We will follow-up final read.  Discussed with Dr. Edilia Bo who also evaluated patient.  Suspect she may have a mild rotator cuff tear.  Advised home range of motion exercises.  She will follow-up with Dr. Edilia Bo in 3 weeks.  Okay for ibuprofen or Tylenol for pain.

## 2021-08-15 NOTE — Progress Notes (Signed)
Subjective:     [name] [name] is a 71 y.o. female presenting for Arm Pain (Right arm Knot on shoulder. X 4 days)     Arm Pain     #Right shoulder pian - went to [employer] on Sunday  - reached over to pick up a case of water - Sunday night could not turn the wrist it hurt so bad - shoulder pain also started Sunday night - wrist pain is improving - shoulder has a knot - treatment: ibuprofen 400 mg every 4-6 hours w/ improvement - hurts to move the shoulder - is able to move it but pain with moving beyond 90 degrees and then lowering  Review of Systems   Social History   Tobacco Use  Smoking Status Every Day   Packs/day: 0.30   Types: Cigarettes  Smokeless Tobacco Never  Tobacco Comments   less than 1/2 a pack        Objective:    BP Readings from Last 3 Encounters:  08/15/21 112/64  08/14/21 116/72  06/17/21 126/76   Wt Readings from Last 3 Encounters:  08/15/21 163 lb (73.9 kg)  08/14/21 163 lb 6.4 oz (74.1 kg)  06/17/21 164 lb 2 oz (74.4 kg)    BP 112/64   Pulse 73   Temp 98.1 F (36.7 C)   Resp 16   Ht 5\' 4"  (1.626 m)   Wt 163 lb (73.9 kg)   SpO2 95%   BMI 27.98 kg/m    Physical Exam Constitutional:      General: She is not in acute distress.    Appearance: She is well-developed. She is not diaphoretic.  HENT:     Right Ear: External ear normal.     Left Ear: External ear normal.     Nose: Nose normal.  Eyes:     Conjunctiva/sclera: Conjunctivae normal.  Cardiovascular:     Rate and Rhythm: Normal rate.  Pulmonary:     Effort: Pulmonary effort is normal.  Musculoskeletal:     Cervical back: Neck supple.     Comments: Right shoulder Inspection: no erythema, swelling and prominence of the anterior shoulder compared to left Palpation: TTP along the anterior shoulder, no clavicle ttp, no posterior ttp, ttp along the humerus on the right ROM: pain at 80 degrees of abduction and flexion of the shoulder   Skin:    General: Skin is  warm and dry.     Capillary Refill: Capillary refill takes less than 2 seconds.  Neurological:     Mental Status: She is alert. Mental status is at baseline.  Psychiatric:        Mood and Affect: Mood normal.        Behavior: Behavior normal.    XR Right Shoulder: some signs of arthritis. No fracturs  XR Right humerus: no fracture       Assessment & Plan:   Problem List Items Addressed This Visit       Other   Acute pain of right shoulder - Primary    No sign of fracture on x-ray.  We will follow-up final read.  Discussed with Dr. Edilia Bo who also evaluated patient.  Suspect she may have a mild rotator cuff tear.  Advised home range of motion exercises.  She will follow-up with Dr. Edilia Bo in 3 weeks.  Okay for ibuprofen or Tylenol for pain.      Relevant Orders   DG Shoulder Right   DG Humerus Right  Return in about 3 weeks (around 09/05/2021) for [place].  Lesleigh Noe, MD

## 2021-08-15 NOTE — Patient Instructions (Addendum)
Looks like no fracture  You may have torn your rotator cuff  Work on range of motion exercises  See Dr. Lorelei Pont in 3 weeks  Shoulder pendulum In this exercise, you let the injured arm dangle toward the floor and then swing it like a clock pendulum. Stand near a table or counter that you can hold onto for balance. Bend forward at the waist and let your left / right arm hang straight down. Use your other arm to support you and help you stay balanced. Relax your left / right arm and shoulder muscles, and move your hips and your trunk so your left / right arm swings freely. Your arm should swing because of the motion of your body, not because you are using your arm or shoulder muscles. Keep moving your hips and trunk so your arm swings in the following directions, as told by your health care provider: Side to side. Forward and backward. In clockwise and counterclockwise circles. Slowly return to the starting position.  Can also try to slowly crawl the fingers to raise the arm and reach forward

## 2021-08-16 ENCOUNTER — Telehealth: Payer: Self-pay

## 2021-08-16 DIAGNOSIS — F17200 Nicotine dependence, unspecified, uncomplicated: Secondary | ICD-10-CM

## 2021-08-16 NOTE — Telephone Encounter (Signed)
[facility] radiology called to give report for recent imaging on R shoulder. Results are in [place].

## 2021-08-16 NOTE — Telephone Encounter (Signed)
Pt notified.   She will come Monday for CXR.   Routing to [employer] - to call if appointment is necessary - otherwise pt advised walk-in ok

## 2021-09-05 ENCOUNTER — Other Ambulatory Visit: Payer: Self-pay | Admitting: Family Medicine

## 2021-09-09 ENCOUNTER — Ambulatory Visit (INDEPENDENT_AMBULATORY_CARE_PROVIDER_SITE_OTHER)
Admission: RE | Admit: 2021-09-09 | Discharge: 2021-09-09 | Disposition: A | Discharge status: Home / Self Care | Payer: Medicare Other | Source: Ambulatory Visit | Attending: Family Medicine | Admitting: Family Medicine

## 2021-09-09 ENCOUNTER — Encounter: Payer: Self-pay | Admitting: Family Medicine

## 2021-09-09 ENCOUNTER — Ambulatory Visit (INDEPENDENT_AMBULATORY_CARE_PROVIDER_SITE_OTHER): Payer: Medicare Other | Admitting: Family Medicine

## 2021-09-09 ENCOUNTER — Telehealth: Payer: Self-pay | Admitting: *Deleted

## 2021-09-09 VITALS — BP 130/70 | HR 69 | Temp 98.4°F | Ht 64.0 in | Wt 166.5 lb

## 2021-09-09 DIAGNOSIS — R911 Solitary pulmonary nodule: Secondary | ICD-10-CM | POA: Diagnosis not present

## 2021-09-09 DIAGNOSIS — M25511 Pain in right shoulder: Secondary | ICD-10-CM

## 2021-09-09 DIAGNOSIS — R9389 Abnormal findings on diagnostic imaging of other specified body structures: Secondary | ICD-10-CM

## 2021-09-09 DIAGNOSIS — F17200 Nicotine dependence, unspecified, uncomplicated: Secondary | ICD-10-CM | POA: Diagnosis not present

## 2021-09-09 NOTE — Telephone Encounter (Signed)
Can you call the patient, and radiology did agree that there is a nodule there, and we should get a follow-up CT of the chest to evaluate this more.  We talked about this earlier in the office if that is what they saw on chest x-ray.

## 2021-09-09 NOTE — Addendum Note (Signed)
Addended by: Owens Loffler on: 09/09/2021 11:24 AM   Modules accepted: Orders

## 2021-09-09 NOTE — Telephone Encounter (Signed)
[name] [name] notified as instructed by telephone.  She is agreeable to CT.  Please place order.

## 2021-09-09 NOTE — Telephone Encounter (Signed)
[name] [name] with [facility] called to report chest x-ray results to Dr. Lorelei Pont. Results are in [place].

## 2021-09-09 NOTE — Progress Notes (Addendum)
[name] Madura T. Orlie Cundari, MD, [place] at [facility] [place], [address]  Phone: [phone]  FAX: [phone]  [name] [name] - 71 y.o. female  MRN [id number]  Date of Birth: 1950-11-11  Date: 09/09/2021  PCP: Abner Greenspan, MD  Referral: Abner Greenspan, MD  Chief Complaint  Patient presents with   Follow-up    Right Shoulder Pain   Subjective:   [name] [name] is a 71 y.o. very pleasant female patient with Body mass index is 28.58 kg/m. who presents with the following:  New consultation for R shoulder pain: I previously briefly examined her when she was seeing my partner Dr. Einar Pheasant.  Briefly, 1 month ago she reached over to pick up a case of water at [employer] and she had some difficulty abducting the shoulder and pain with abduction and flexion after this.  The last time I saw her she was able to actively elevate her shoulder to roughly 80 degrees and then had some pain beyond this.  She did have some notable decreased strength at that time.  As of now, her shoulder pain and motion is gotten much better, range of motion is now full.  She also did have a pulmonary nodule visualized on the shoulder x-ray, and radiology did recommend follow-up.  She does have a history of being a longtime smoker.  Needs a f/u chest x-ray  ROM and str is much better.   Review of Systems is noted in the HPI, as appropriate  Objective:   BP 130/70   Pulse 69   Temp 98.4 F (36.9 C) (Oral)   Ht 5\' 4"  (1.626 m)   Wt 166 lb 8 oz (75.5 kg)   SpO2 93%   BMI 28.58 kg/m   GEN: No acute distress; alert,appropriate. PULM: Breathing comfortably in no respiratory distress PSYCH: Normally interactive   Shoulder: R Inspection: No muscle wasting or winging Ecchymosis/edema: neg  AC joint, scapula, clavicle: NT Cervical spine: NT, full ROM Spurling's: neg Abduction: full, 5/5 Flexion: full, 5/5 IR, full, lift-off: 5/5 ER at  neutral: full, 5/5 AC crossover and compression: neg Neer: neg Hawkins: neg Drop Test: neg Empty Can: neg Supraspinatus insertion: NT Bicipital groove: NT Speed's: neg Yergason's: neg Sulcus sign: neg Scapular dyskinesis: none C5-T1 intact Sensation intact Grip 5/5   Laboratory and Imaging Data:  Assessment and Plan:     ICD-10-CM   1. Acute pain of right shoulder  M25.511     2. Nodule of right lung  R91.1 DG Chest 2 View    3. Smoker  F17.200 DG Chest 2 View     I do not really have any concerns regarding her right shoulder.  This is doing much better, range of motion is better, and strength is normal.  Suspect she had a very minor partial cuff injury that is now recovered.  Right nodule of the lung, history of smoking, will need to evaluate this further with a chest x-ray, following radiological input on shoulder film.  Additional imaging or work-up will be dependent somewhat on chest x-ray findings.  Addendum: 09/09/21 11:22 AM  Patient's chest x-ray has returned with additional abnormality on top of the patient's shoulder x-ray.  On chest x-ray there is a left apical pulmonary nodule.  And a history of a smoker and following radiological recommendation, obtain a chest CT with contrast to evaluate nodule for potential neoplasm and follow-up on visualized pulmonary nodule.  DG Chest 2 View  Result Date: 09/09/2021 CLINICAL DATA:  Possible nodularity right lung on prior shoulder radiograph. EXAM: CHEST - 2 VIEW COMPARISON:  Chest radiograph November 21, 2009 FINDINGS: Stable cardiac and mediastinal contours. There is a small left apical pulmonary nodule. No large area pulmonary consolidation. No pleural effusion or pneumothorax. Osseous structures unremarkable. IMPRESSION: Small left apical pulmonary nodule. Recommend further evaluation with chest CT. These results will be called to the ordering clinician or representative by the Radiologist Assistant, and communication  documented in the PACS or [employer]. Electronically Signed   By: Lovey Newcomer M.D.   On: 09/09/2021 10:10     Medication Management during today's office visit: No orders of the defined types were placed in this encounter.  There are no discontinued medications.  Orders placed today for conditions managed today: Orders Placed This Encounter  Procedures   DG Chest 2 View    Follow-up if needed: No follow-ups on file.  [employer] One speech-to-text software was used for transcription in this dictation.  Possible transcriptional errors can occur using Editor, commissioning.   Signed,  [name] [name]. Pervis Macintyre, MD   Outpatient Encounter Medications as of 09/09/2021  Medication Sig   aspirin 81 MG tablet Take 81 mg by mouth daily.     atorvastatin (LIPITOR) 10 MG tablet TAKE 1 TABLET BY MOUTH EVERY DAY   busPIRone (BUSPAR) 15 MG tablet TAKE 1/2 TABLET BY MOUTH TWICE A DAY   DULoxetine (CYMBALTA) 60 MG capsule TAKE 1 CAPSULE BY MOUTH EVERY DAY   Melatonin 10 MG CAPS Take 20 mg by mouth at bedtime.   naproxen sodium (ALEVE) 220 MG tablet Take 440 mg by mouth daily as needed (headaches).   pregabalin (LYRICA) 75 MG capsule TAKE 1 CAPSULE BY MOUTH TWICE A DAY   traMADol (ULTRAM) 50 MG tablet TAKE 1 TABLET BY MOUTH EVERY 8 HOURS AS NEEDED. NOT COVERED BY INSURANCE.   No facility-administered encounter medications on file as of 09/09/2021.

## 2021-09-17 ENCOUNTER — Ambulatory Visit
Admission: RE | Admit: 2021-09-17 | Discharge: 2021-09-17 | Disposition: A | Discharge status: Home / Self Care | Payer: Medicare Other | Source: Ambulatory Visit | Attending: Family Medicine | Admitting: Family Medicine

## 2021-09-17 DIAGNOSIS — J984 Other disorders of lung: Secondary | ICD-10-CM | POA: Diagnosis not present

## 2021-09-17 DIAGNOSIS — I2510 Atherosclerotic heart disease of native coronary artery without angina pectoris: Secondary | ICD-10-CM | POA: Diagnosis not present

## 2021-09-17 DIAGNOSIS — R911 Solitary pulmonary nodule: Secondary | ICD-10-CM

## 2021-09-17 DIAGNOSIS — I700 Atherosclerosis of aorta: Secondary | ICD-10-CM | POA: Diagnosis not present

## 2021-09-17 DIAGNOSIS — R918 Other nonspecific abnormal finding of lung field: Secondary | ICD-10-CM | POA: Diagnosis not present

## 2021-09-17 DIAGNOSIS — R9389 Abnormal findings on diagnostic imaging of other specified body structures: Secondary | ICD-10-CM

## 2021-09-17 MED ORDER — IOPAMIDOL (ISOVUE-300) INJECTION 61%
100.0000 mL | Freq: Once | INTRAVENOUS | Status: AC | PRN
  Administered 2021-09-17: 100 mL via INTRAVENOUS

## 2021-09-18 ENCOUNTER — Telehealth: Payer: Self-pay | Admitting: Family Medicine

## 2021-09-18 DIAGNOSIS — R9389 Abnormal findings on diagnostic imaging of other specified body structures: Secondary | ICD-10-CM

## 2021-09-18 DIAGNOSIS — R911 Solitary pulmonary nodule: Secondary | ICD-10-CM

## 2021-09-18 NOTE — Telephone Encounter (Signed)
We reviewed over the phone just now, and I think that getting Pulmonology involved to direct care makes the most sense to me.  Will consult them.  Cc: Dr. Glori Bickers  CT Chest W Contrast  Result Date: 09/17/2021 CLINICAL DATA:  Small left apical lung nodule on recent chest radiographs. EXAM: CT CHEST WITH CONTRAST TECHNIQUE: Multidetector CT imaging of the chest was performed during intravenous contrast administration. RADIATION DOSE REDUCTION: This exam was performed according to the departmental dose-optimization program which includes automated exposure control, adjustment of the mA and/or kV according to patient size and/or use of iterative reconstruction technique. CONTRAST:  117mL ISOVUE-300 IOPAMIDOL (ISOVUE-300) INJECTION 61% COMPARISON:  Chest radiographs dated 09/09/2021. FINDINGS: Cardiovascular: Atheromatous calcifications, including the coronary arteries and aorta. Dilated left atrium and left ventricle. Mediastinum/Nodes: Small amount of superior recess pericardial fluid. No enlarged lymph nodes. Unremarkable esophagus and thyroid gland. Lungs/Pleura: Superior left upper lobe nodule measuring 10 x 9 mm in maximum dimensions on image number 29/3. This corresponds to the nodule seen radiographically. This is at the inferior aspect of an area of pleural and parenchymal scarring. No other lung nodules seen. No airspace consolidation or pleural fluid. Minimal linear atelectasis or scarring in the posterior left lower lobe. Upper Abdomen: Areas of left renal cortical scarring. No adrenal or liver masses seen. Musculoskeletal: Thoracic and lower cervical spine degenerative changes. No evidence of bony metastatic disease. IMPRESSION: 1. 10 mm mean diameter nodule in the left upper lobe adjacent to an area of pleural and parenchymal scarring. Consider one of the following in 3 months for both low-risk and high-risk individuals: (a) repeat chest CT, (b) follow-up PET-CT, or (c) tissue sampling. This  recommendation follows the consensus statement: Guidelines for Management of Incidental Pulmonary Nodules Detected on CT Images: From the Fleischner Society 2017; Radiology 2017; 284:228-243. 2.  Calcific coronary artery and aortic atherosclerosis. 3. Left atrial and left ventricular dilatation. Aortic Atherosclerosis (ICD10-I70.0). Electronically Signed   By: Claudie Revering M.D.   On: 09/17/2021 14:52

## 2021-09-18 NOTE — Telephone Encounter (Signed)
Will cc: Dr. Glori Bickers

## 2021-10-29 ENCOUNTER — Encounter: Payer: Self-pay | Admitting: Pulmonary Disease

## 2021-10-29 ENCOUNTER — Ambulatory Visit: Payer: Medicare Other | Admitting: Pulmonary Disease

## 2021-10-29 ENCOUNTER — Telehealth: Payer: Self-pay

## 2021-10-29 VITALS — BP 118/64 | HR 78 | Temp 97.7°F | Ht 64.0 in | Wt 166.8 lb

## 2021-10-29 DIAGNOSIS — J449 Chronic obstructive pulmonary disease, unspecified: Secondary | ICD-10-CM

## 2021-10-29 DIAGNOSIS — R911 Solitary pulmonary nodule: Secondary | ICD-10-CM

## 2021-10-29 DIAGNOSIS — F17210 Nicotine dependence, cigarettes, uncomplicated: Secondary | ICD-10-CM | POA: Diagnosis not present

## 2021-10-29 MED ORDER — TRELEGY ELLIPTA 100-62.5-25 MCG/ACT IN AEPB: 1.0000 | INHALATION_SPRAY | Freq: Every day | RESPIRATORY_TRACT | 11 refills | Status: DC

## 2021-10-29 MED ORDER — TRELEGY ELLIPTA 100-62.5-25 MCG/ACT IN AEPB
1.0000 | INHALATION_SPRAY | Freq: Every day | RESPIRATORY_TRACT | 0 refills | Status: DC
Start: 2021-10-29 — End: 2021-11-07

## 2021-10-29 NOTE — Patient Instructions (Addendum)
We are going to schedule a PET/CT and planning CT for your biopsy.  Biopsy has been scheduled for 13 September this will give [place] time to get the tests that you need prior to the procedure.  It would also be with enough time for you to have your holiday with the family.  We are giving you a trial of an inhaler called Trelegy Ellipta this is 1 puff daily.  Make sure you rinse your mouth well after you use it.  We did detect some wheezing today during your examination.  We will see you in follow-up in 4 to 6 weeks time but we will stay in contact with you before and after the procedure.

## 2021-10-29 NOTE — Progress Notes (Signed)
Subjective:    Patient ID: [name] [name], female    DOB: Dec 12, 1950, 71 y.o.   MRN: [id number] Patient Care Team: Abner Greenspan, MD as PCP - General  Chief Complaint  Patient presents with   Pulmonary consult    CT 10/17/2021--no current sx.    HPI Is a 71 year old current smoker (half PPD, 17 PY) who presents for evaluation of a left upper lobe nodule.  She is kindly referred by Dr. Owens Loffler, her primary care physician is Dr. Loura Pardon.  The patient states that she was referred to Dr. Lorelei Pont for right shoulder pain.  In June she had a shoulder x-ray that showed potential nodularity on the right lung.  A dedicated chest x-ray was then obtained on 10 July.  Chest x-ray did not show nodularity on the right but however did show a small LEFT apical pulmonary nodule.  A chest CT was recommended.  This was obtained on 18 July.  The chest x-ray showed a 10 mm diameter nodule in the LEFT upper lobe adjacent to an area of pleural-parenchymal scarring.  After these findings she was referred here for further evaluation.  Patient has not been enrolled in lung cancer screening previously.  The patient is somatic with regards to these findings.  She has not had any fevers, chills or sweats.  No cough or sputum production.  No hemoptysis.  She does not endorse shortness of breath.  No chest pain.  She notes that the right shoulder pain that she was experiencing has improved.  She has not noted any lower extremity edema or calf tenderness.  The patient has noticed a smoker.  At most she has smoked a pack of cigarettes per day recently has cut down to half a pack of cigarettes per day.  She has been smoking since age 21.  She does not have significant occupational exposure.  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  On review of her past medical history as noted that she has "sleep disorder" patient states that  this was due to difficulty falling asleep.  She does not have a history of sleep apnea.  No symptoms of sleep apnea.   Review of Systems A 10 point review of systems was performed and it is as noted above otherwise negative.  Past Medical History:  Diagnosis Date   Arthritis    OA   Carpal tunnel syndrome    Depression    Endometriosis    Fibromyalgia    Post herpetic neuralgia    Sleep disorder    Tobacco abuse    Past Surgical History:  Procedure Laterality Date   ABDOMINAL HYSTERECTOMY  1992   total endometriosis   APPENDECTOMY  1992   bladder tack     CATARACT EXTRACTION W/PHACO Right 03/25/2018   Procedure: CATARACT EXTRACTION PHACO AND INTRAOCULAR LENS PLACEMENT ([place]) RIGHT;  Surgeon: Marchia Meiers, MD;  Location: [facility] ORS;  Service: Ophthalmology;  Laterality: Right;  [place]  01:11 CDE 8.02 Fluid pack lot # [id number]   CATARACT EXTRACTION W/PHACO Left 04/22/2018   Procedure: CATARACT EXTRACTION PHACO AND INTRAOCULAR LENS PLACEMENT ([place]) Left Eye;  Surgeon: Marchia Meiers, MD;  Location: [facility] ORS;  Service: Ophthalmology;  Laterality: Left;  [place]  01:02 CDE 8.11 Fluid pack lot # [id number]   FOOT SURGERY Right 04/2016   [place] in [place]   Patient Active Problem List   Diagnosis  Date Noted   Acute pain of right shoulder 08/15/2021   Elevated glucose level 01/02/2019   Elevated TSH 11/30/2018   Grief reaction 11/24/2017   Multiple joint pain 09/16/2016   Osteopenia 06/15/2016   Screening mammogram, encounter for 04/28/2016   Estrogen deficiency 04/28/2016   Medicare annual wellness visit, initial 01/02/2015   Colon cancer screening 05/31/2013   Hyperkalemia 11/30/2012   Retinopathy, bilateral 10/05/2012   Hyperlipidemia, mild 10/05/2012   Adverse effects of medication 03/24/2012   Routine general medical examination at a health care facility 01/22/2011   Generalized osteoarthritis of hand 07/06/2009   SEBACEOUS CYST, NECK 07/13/2007   SLEEP DISORDER  01/18/2007   POSTHERPETIC NEURALGIA 12/15/2006   TOBACCO ABUSE 12/15/2006   CARPAL TUNNEL SYNDROME 12/15/2006   Osteoarthritis 12/15/2006   Fibromyalgia 12/15/2006   Family History  Problem Relation Age of Onset   Heart disease Mother        MI   Heart disease Father        MI   Arthritis Sister        crippling arthritis   Arthritis Brother        crippling arthritis   Social History   Tobacco Use   Smoking status: Every Day    Packs/day: 1.00    Years: 55.00    Total pack years: 55.00    Types: Cigarettes, E-cigarettes   Smokeless tobacco: Never   Tobacco comments:    0.5PPD 10/29/2021  Substance Use Topics   Alcohol use: No    Alcohol/week: 0.0 standard drinks of alcohol   Allergies  Allergen Reactions   Acetaminophen Other (See Comments)    GI upset   Current Meds  Medication Sig   aspirin 81 MG tablet Take 81 mg by mouth daily.     atorvastatin (LIPITOR) 10 MG tablet TAKE 1 TABLET BY MOUTH EVERY DAY   busPIRone (BUSPAR) 15 MG tablet TAKE 1/2 TABLET BY MOUTH TWICE A DAY   DULoxetine (CYMBALTA) 60 MG capsule TAKE 1 CAPSULE BY MOUTH EVERY DAY   Melatonin 10 MG CAPS Take 20 mg by mouth at bedtime.   naproxen sodium (ALEVE) 220 MG tablet Take 440 mg by mouth daily as needed (headaches).   pregabalin (LYRICA) 75 MG capsule TAKE 1 CAPSULE BY MOUTH TWICE A DAY   traMADol (ULTRAM) 50 MG tablet TAKE 1 TABLET BY MOUTH EVERY 8 HOURS AS NEEDED. NOT COVERED BY INSURANCE.   Immunization History  Administered Date(s) Administered   Fluad Quad(high Dose 65+) 11/30/2018, 02/07/2020   Influenza Split 01/22/2011, 03/24/2012   Influenza, High Dose Seasonal PF 11/07/2016   Influenza,inj,Quad PF,6+ Mos 11/30/2012, 12/06/2014, 11/22/2015, 11/24/2017   Pneumococcal Conjugate-13 01/02/2015   Pneumococcal Polysaccharide-23 01/22/2011, 09/12/2016   Td 05/01/1998, 03/04/2007       Objective:   Physical Exam BP 118/64 (BP Location: Left Arm, Cuff Size: Normal)   Pulse 78   Temp  97.7 F (36.5 C) (Temporal)   Ht 5\' 4"  (1.626 m)   Wt 166 lb 12.8 oz (75.7 kg)   SpO2 96%   BMI 28.63 kg/m  GENERAL: Well-developed somewhat overweight woman, no acute distress. HEAD: Normocephalic, atraumatic.  EYES: Pupils equal, round, reactive to light.  No scleral icterus.  MOUTH: Dentures uppers and lowers, oral mucosa moist, Mallampati III airway. NECK: Supple. No thyromegaly. Trachea midline. No JVD.  No adenopathy. PULMONARY: Good air entry bilaterally.  Diffuse wheezing and rhonchi noted bilaterally. CARDIOVASCULAR: S1 and S2. Regular rate and rhythm.  ABDOMEN: Benign. MUSCULOSKELETAL:  No joint deformity, no clubbing, no edema.  NEUROLOGIC:  SKIN: Intact,warm,dry. PSYCH: Mood and behavior normal.  Representative images from the CT performed 17 September 2021 independently reviewed:        SPN malignancy risk score (Mayo): 47%    Assessment & Plan:     ICD-10-CM   1. Nodule of upper lobe of left lung 10 mm  R91.1 CT SUPER D CHEST WO [facility] PILOT    NM PET Image Initial (PI) Skull Base To Thigh (F-18 FDG)   SPN malignancy risk score (Mayo) 47% Recommend PET/CT and [facility] protocol CT Recommend biopsy by robotic assisted bronchoscopy    2. COPD suggested by initial evaluation [facility])  J44.9    Patient noted to have wheezing and rhonchi States she is asymptomatic Trial of Trelegy Ellipta 100 Needs optimization vis--vis upcoming bronchoscopy    3. Tobacco dependence due to cigarettes  F17.210    Patient counseled regards to discontinuation of smoking Total counseling time 3 to 5 minutes     Orders Placed This Encounter  Procedures   CT SUPER D CHEST WO [facility] PILOT    Standing Status:   Future    Standing Expiration Date:   10/30/2022    Scheduling Instructions:     Prior to 11/13/2021--same day as PET if possible.    Order Specific Question:   Preferred imaging location?    Answer:   [facility]   NM PET Image Initial (PI) Skull Base To Thigh (F-18  FDG)    Standing Status:   Future    Standing Expiration Date:   10/30/2022    Scheduling Instructions:     Prior to 11/13/2021--same day as CT if possible.    Order Specific Question:   If indicated for the ordered procedure, I authorize the administration of a radiopharmaceutical per Radiology protocol    Answer:   Yes    Order Specific Question:   Preferred imaging location?    Answer:   [facility] ordered this encounter  Medications   Fluticasone-Umeclidin-Vilant (TRELEGY ELLIPTA) 100-62.5-25 MCG/ACT AEPB    Sig: Inhale 1 puff into the lungs daily.    Dispense:  14 each    Refill:  0    Order Specific Question:   Lot Number?    Answer:   Jiles Crocker    Order Specific Question:   Expiration Date?    Answer:   03/04/2023    Order Specific Question:   Manufacturer?    Answer:   GlaxoSmithKline [12]    Order Specific Question:   Quantity    Answer:   1   Fluticasone-Umeclidin-Vilant (TRELEGY ELLIPTA) 100-62.5-25 MCG/ACT AEPB    Sig: Inhale 1 puff into the lungs daily.    Dispense:  28 each    Refill:  11   Probability of malignancy of the findings above is high at 47% given the patient's history of smoking.  Proceed with PET/CT and imaging for mapping for robotic bronchoscopy.  The potential benefits, limitations and complications of procedure were explained to the patient.  Robotic bronchoscopy is the best modality to biopsy the lesion given its size and location.  Benefits, limitations and potential complications of the procedure were discussed with the patient/family.  Complications from bronchoscopy are rare and most often minor, but if they occur they may include breathing difficulty, vocal cord spasm, hoarseness, slight fever, vomiting, dizziness, bronchospasm, infection, low blood oxygen, bleeding from biopsy site, or an allergic reaction to medications.  It is  uncommon for patients to experience other more serious complications for example: Collapsed lung requiring chest  tube placement, respiratory failure, heart attack and/or cardiac arrhythmia.  Patient agrees to proceed.  The patient has been scheduled for robotic bronchoscopy on 13 September.  For the purposes of the procedure ventilation strategy will be as follows: Tidal volume 430, PEEP 8-12, FiO2 40 to 70%  C. Derrill Kay, MD Advanced Bronchoscopy PCCM [facility]    *This note was dictated using voice recognition software/Dragon.  Despite best efforts to proofread, errors can occur which can change the meaning. Any transcriptional errors that result from this process are unintentional and may not be fully corrected at the time of dictation.

## 2021-10-29 NOTE — Telephone Encounter (Signed)
Robotic bronchoscopy scheduled 11/13/2021 at 12:30. [id number], [id number] WK:[id number] nodule.   Rodena Piety, please see bronch info.

## 2021-10-29 NOTE — H&P (View-Only) (Signed)
Subjective:    Patient ID: [name] [name], female    DOB: 18-Jun-1950, 71 y.o.   MRN: [id number] Patient Care Team: Abner Greenspan, MD as PCP - General  Chief Complaint  Patient presents with   Pulmonary consult    CT 10/17/2021--no current sx.    HPI Is a 71 year old current smoker (half PPD, 14 PY) who presents for evaluation of a left upper lobe nodule.  She is kindly referred by Dr. Owens Loffler, her primary care physician is Dr. Loura Pardon.  The patient states that she was referred to Dr. Lorelei Pont for right shoulder pain.  In June she had a shoulder x-ray that showed potential nodularity on the right lung.  A dedicated chest x-ray was then obtained on 10 July.  Chest x-ray did not show nodularity on the right but however did show a small LEFT apical pulmonary nodule.  A chest CT was recommended.  This was obtained on 18 July.  The chest x-ray showed a 10 mm diameter nodule in the LEFT upper lobe adjacent to an area of pleural-parenchymal scarring.  After these findings she was referred here for further evaluation.  Patient has not been enrolled in lung cancer screening previously.  The patient is somatic with regards to these findings.  She has not had any fevers, chills or sweats.  No cough or sputum production.  No hemoptysis.  She does not endorse shortness of breath.  No chest pain.  She notes that the right shoulder pain that she was experiencing has improved.  She has not noted any lower extremity edema or calf tenderness.  The patient has noticed a smoker.  At most she has smoked a pack of cigarettes per day recently has cut down to half a pack of cigarettes per day.  She has been smoking since age 2.  She does not have significant occupational exposure.  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  On review of her past medical history as noted that she has "sleep disorder" patient states that  this was due to difficulty falling asleep.  She does not have a history of sleep apnea.  No symptoms of sleep apnea.   Review of Systems A 10 point review of systems was performed and it is as noted above otherwise negative.  Past Medical History:  Diagnosis Date   Arthritis    OA   Carpal tunnel syndrome    Depression    Endometriosis    Fibromyalgia    Post herpetic neuralgia    Sleep disorder    Tobacco abuse    Past Surgical History:  Procedure Laterality Date   ABDOMINAL HYSTERECTOMY  1992   total endometriosis   APPENDECTOMY  1992   bladder tack     CATARACT EXTRACTION W/PHACO Right 03/25/2018   Procedure: CATARACT EXTRACTION PHACO AND INTRAOCULAR LENS PLACEMENT ([place]) RIGHT;  Surgeon: Marchia Meiers, MD;  Location: [facility] ORS;  Service: Ophthalmology;  Laterality: Right;  [place]  01:11 CDE 8.02 Fluid pack lot # [id number]   CATARACT EXTRACTION W/PHACO Left 04/22/2018   Procedure: CATARACT EXTRACTION PHACO AND INTRAOCULAR LENS PLACEMENT ([place]) Left Eye;  Surgeon: Marchia Meiers, MD;  Location: [facility] ORS;  Service: Ophthalmology;  Laterality: Left;  [place]  01:02 CDE 8.11 Fluid pack lot # [id number]   FOOT SURGERY Right 04/2016   [place] in [place]   Patient Active Problem List   Diagnosis  Date Noted   Acute pain of right shoulder 08/15/2021   Elevated glucose level 01/02/2019   Elevated TSH 11/30/2018   Grief reaction 11/24/2017   Multiple joint pain 09/16/2016   Osteopenia 06/15/2016   Screening mammogram, encounter for 04/28/2016   Estrogen deficiency 04/28/2016   Medicare annual wellness visit, initial 01/02/2015   Colon cancer screening 05/31/2013   Hyperkalemia 11/30/2012   Retinopathy, bilateral 10/05/2012   Hyperlipidemia, mild 10/05/2012   Adverse effects of medication 03/24/2012   Routine general medical examination at a health care facility 01/22/2011   Generalized osteoarthritis of hand 07/06/2009   SEBACEOUS CYST, NECK 07/13/2007   SLEEP DISORDER  01/18/2007   POSTHERPETIC NEURALGIA 12/15/2006   TOBACCO ABUSE 12/15/2006   CARPAL TUNNEL SYNDROME 12/15/2006   Osteoarthritis 12/15/2006   Fibromyalgia 12/15/2006   Family History  Problem Relation Age of Onset   Heart disease Mother        MI   Heart disease Father        MI   Arthritis Sister        crippling arthritis   Arthritis Brother        crippling arthritis   Social History   Tobacco Use   Smoking status: Every Day    Packs/day: 1.00    Years: 55.00    Total pack years: 55.00    Types: Cigarettes, E-cigarettes   Smokeless tobacco: Never   Tobacco comments:    0.5PPD 10/29/2021  Substance Use Topics   Alcohol use: No    Alcohol/week: 0.0 standard drinks of alcohol   Allergies  Allergen Reactions   Acetaminophen Other (See Comments)    GI upset   Current Meds  Medication Sig   aspirin 81 MG tablet Take 81 mg by mouth daily.     atorvastatin (LIPITOR) 10 MG tablet TAKE 1 TABLET BY MOUTH EVERY DAY   busPIRone (BUSPAR) 15 MG tablet TAKE 1/2 TABLET BY MOUTH TWICE A DAY   DULoxetine (CYMBALTA) 60 MG capsule TAKE 1 CAPSULE BY MOUTH EVERY DAY   Melatonin 10 MG CAPS Take 20 mg by mouth at bedtime.   naproxen sodium (ALEVE) 220 MG tablet Take 440 mg by mouth daily as needed (headaches).   pregabalin (LYRICA) 75 MG capsule TAKE 1 CAPSULE BY MOUTH TWICE A DAY   traMADol (ULTRAM) 50 MG tablet TAKE 1 TABLET BY MOUTH EVERY 8 HOURS AS NEEDED. NOT COVERED BY INSURANCE.   Immunization History  Administered Date(s) Administered   Fluad Quad(high Dose 65+) 11/30/2018, 02/07/2020   Influenza Split 01/22/2011, 03/24/2012   Influenza, High Dose Seasonal PF 11/07/2016   Influenza,inj,Quad PF,6+ Mos 11/30/2012, 12/06/2014, 11/22/2015, 11/24/2017   Pneumococcal Conjugate-13 01/02/2015   Pneumococcal Polysaccharide-23 01/22/2011, 09/12/2016   Td 05/01/1998, 03/04/2007       Objective:   Physical Exam BP 118/64 (BP Location: Left Arm, Cuff Size: Normal)   Pulse 78   Temp  97.7 F (36.5 C) (Temporal)   Ht 5\' 4"  (1.626 m)   Wt 166 lb 12.8 oz (75.7 kg)   SpO2 96%   BMI 28.63 kg/m  GENERAL: Well-developed somewhat overweight woman, no acute distress. HEAD: Normocephalic, atraumatic.  EYES: Pupils equal, round, reactive to light.  No scleral icterus.  MOUTH: Dentures uppers and lowers, oral mucosa moist, Mallampati III airway. NECK: Supple. No thyromegaly. Trachea midline. No JVD.  No adenopathy. PULMONARY: Good air entry bilaterally.  Diffuse wheezing and rhonchi noted bilaterally. CARDIOVASCULAR: S1 and S2. Regular rate and rhythm.  ABDOMEN: Benign. MUSCULOSKELETAL:  No joint deformity, no clubbing, no edema.  NEUROLOGIC:  SKIN: Intact,warm,dry. PSYCH: Mood and behavior normal.  Representative images from the CT performed 17 September 2021 independently reviewed:        SPN malignancy risk score (Mayo): 47%    Assessment & Plan:     ICD-10-CM   1. Nodule of upper lobe of left lung 10 mm  R91.1 CT SUPER D CHEST WO [facility] PILOT    NM PET Image Initial (PI) Skull Base To Thigh (F-18 FDG)   SPN malignancy risk score (Mayo) 47% Recommend PET/CT and [facility] protocol CT Recommend biopsy by robotic assisted bronchoscopy    2. COPD suggested by initial evaluation [facility])  J44.9    Patient noted to have wheezing and rhonchi States she is asymptomatic Trial of Trelegy Ellipta 100 Needs optimization vis--vis upcoming bronchoscopy    3. Tobacco dependence due to cigarettes  F17.210    Patient counseled regards to discontinuation of smoking Total counseling time 3 to 5 minutes     Orders Placed This Encounter  Procedures   CT SUPER D CHEST WO [facility] PILOT    Standing Status:   Future    Standing Expiration Date:   10/30/2022    Scheduling Instructions:     Prior to 11/13/2021--same day as PET if possible.    Order Specific Question:   Preferred imaging location?    Answer:   [facility]   NM PET Image Initial (PI) Skull Base To Thigh (F-18  FDG)    Standing Status:   Future    Standing Expiration Date:   10/30/2022    Scheduling Instructions:     Prior to 11/13/2021--same day as CT if possible.    Order Specific Question:   If indicated for the ordered procedure, I authorize the administration of a radiopharmaceutical per Radiology protocol    Answer:   Yes    Order Specific Question:   Preferred imaging location?    Answer:   [facility] ordered this encounter  Medications   Fluticasone-Umeclidin-Vilant (TRELEGY ELLIPTA) 100-62.5-25 MCG/ACT AEPB    Sig: Inhale 1 puff into the lungs daily.    Dispense:  14 each    Refill:  0    Order Specific Question:   Lot Number?    Answer:   Jiles Crocker    Order Specific Question:   Expiration Date?    Answer:   03/04/2023    Order Specific Question:   Manufacturer?    Answer:   Gl[employer]oSmithKline [12]    Order Specific Question:   Quantity    Answer:   1   Fluticasone-Umeclidin-Vilant (TRELEGY ELLIPTA) 100-62.5-25 MCG/ACT AEPB    Sig: Inhale 1 puff into the lungs daily.    Dispense:  28 each    Refill:  11   Probability of malignancy of the findings above is high at 47% given the patient's history of smoking.  Proceed with PET/CT and imaging for mapping for robotic bronchoscopy.  The potential benefits, limitations and complications of procedure were explained to the patient.  Robotic bronchoscopy is the best modality to biopsy the lesion given its size and location.  Benefits, limitations and potential complications of the procedure were discussed with the patient/family.  Complications from bronchoscopy are rare and most often minor, but if they occur they may include breathing difficulty, vocal cord spasm, hoarseness, slight fever, vomiting, dizziness, bronchospasm, infection, low blood oxygen, bleeding from biopsy site, or an allergic reaction to medications.  It is  uncommon for patients to experience other more serious complications for example: Collapsed lung requiring chest  tube placement, respiratory failure, heart attack and/or cardiac arrhythmia.  Patient agrees to proceed.  The patient has been scheduled for robotic bronchoscopy on 13 September.  For the purposes of the procedure ventilation strategy will be as follows: Tidal volume 430, PEEP 8-12, FiO2 40 to 70%  C. Derrill Kay, MD Advanced Bronchoscopy PCCM [facility]    *This note was dictated using voice recognition software/Dragon.  Despite best efforts to proofread, errors can occur which can change the meaning. Any transcriptional errors that result from this process are unintentional and may not be fully corrected at the time of dictation.

## 2021-10-30 NOTE — Telephone Encounter (Signed)
Phone pre admit visit 11/07/2021 between 8-1 and covid test 11/11/2021 between 8-12.  Patient is aware of above dates/times and voiced her understanding.

## 2021-10-30 NOTE — Telephone Encounter (Signed)
For the codes [address]27, [phone] Prior [place] Not Required Refer # [id number]

## 2021-10-30 NOTE — Telephone Encounter (Signed)
Noted  

## 2021-11-01 DIAGNOSIS — R911 Solitary pulmonary nodule: Secondary | ICD-10-CM

## 2021-11-01 HISTORY — DX: Solitary pulmonary nodule: R91.1

## 2021-11-07 ENCOUNTER — Encounter
Admission: RE | Admit: 2021-11-07 | Discharge: 2021-11-07 | Disposition: A | Discharge status: Home / Self Care | Payer: Medicare Other | Source: Ambulatory Visit | Attending: Pulmonary Disease | Admitting: Pulmonary Disease

## 2021-11-07 VITALS — Ht 64.0 in | Wt 166.0 lb

## 2021-11-07 DIAGNOSIS — E785 Hyperlipidemia, unspecified: Secondary | ICD-10-CM

## 2021-11-07 DIAGNOSIS — Z01812 Encounter for preprocedural laboratory examination: Secondary | ICD-10-CM

## 2021-11-07 HISTORY — DX: Hyperlipidemia, unspecified: E78.5

## 2021-11-07 NOTE — Patient Instructions (Addendum)
Covid testing is Monday, September 11 at pre-admission testing department. [facility]) at 9:30 am.  Your procedure is scheduled on: Wednesday, September 13 Report to the Registration Desk on the 1st floor of the [facility]. To find out your arrival time, please call [phone] between 1PM - 3PM on: Tuesday, September 12 If your arrival time is 6:00 am, do not arrive prior to that time as the [place] entrance doors do not open until 6:00 am.  REMEMBER: Instructions that are not followed completely may result in serious medical risk, up to and including death; or upon the discretion of your surgeon and anesthesiologist your surgery may need to be rescheduled.  Do not eat food after midnight the night before surgery.  No gum chewing, lozengers or hard candies.  TAKE THESE MEDICATIONS THE MORNING OF SURGERY WITH A SIP OF WATER:  Buspirone Duloxetine (Cymbalta) Trelegy ellipta inhaler Pregabalin (lyrica) Tramadol if needed for pain  One week prior to surgery: starting today, September 7 Stop aspirin and Anti-inflammatories (NSAIDS) such as Advil, Aleve, Ibuprofen, Motrin, Naproxen, Naprosyn and Aspirin based products such as Excedrin, Goodys Powder, BC Powder. Stop ANY OVER THE COUNTER supplements until after surgery. Stop melatonin. You may however, continue to take Tylenol if needed for pain up until the day of surgery.  No Alcohol for 24 hours before or after surgery.  No Smoking including e-cigarettes for 24 hours prior to surgery.  No chewable tobacco products for at least 6 hours prior to surgery.  No nicotine patches on the day of surgery.  Do not use any "recreational" drugs for at least a week prior to your surgery.  Please be advised that the combination of cocaine and anesthesia may have negative outcomes, up to and including death. If you test positive for cocaine, your surgery will be cancelled.  On the morning of surgery brush your teeth with toothpaste and water,  you may rinse your mouth with mouthwash if you wish. Do not swallow any toothpaste or mouthwash.  Do not wear jewelry, make-up, hairpins, clips or nail polish.  Do not wear lotions, powders, or perfumes.   Do not shave body from the neck down 48 hours prior to surgery just in case you cut yourself which could leave a site for infection.   Contact lenses, hearing aids and dentures may not be worn into surgery.  Do not bring valuables to the hospital. [facility] is not responsible for any missing/lost belongings or valuables.   Notify your doctor if there is any change in your medical condition (cold, fever, infection).  Wear comfortable clothing (specific to your surgery type) to the hospital.  After surgery, you can help prevent lung complications by doing breathing exercises.  Take deep breaths and cough every 1-2 hours. Your doctor may order a device called an Incentive Spirometer to help you take deep breaths.  If you are being discharged the day of surgery, you will not be allowed to drive home. You will need a responsible adult (18 years or older) to drive you home and stay with you that night.   If you are taking public transportation, you will need to have a responsible adult (18 years or older) with you. Please confirm with your physician that it is acceptable to use public transportation.   Please call the [place] Dept. at [phone] if you have any questions about these instructions.  Surgery Visitation Policy:  Patients undergoing a surgery or procedure may have two family members or  support persons with them as long as the person is not COVID-19 positive or experiencing its symptoms.

## 2021-11-11 ENCOUNTER — Encounter
Admission: RE | Admit: 2021-11-11 | Discharge: 2021-11-11 | Disposition: A | Discharge status: Home / Self Care | Payer: Medicare Other | Source: Ambulatory Visit | Attending: Pulmonary Disease | Admitting: Pulmonary Disease

## 2021-11-11 ENCOUNTER — Ambulatory Visit
Admission: RE | Admit: 2021-11-11 | Discharge: 2021-11-11 | Disposition: A | Discharge status: Home / Self Care | Payer: Medicare Other | Source: Ambulatory Visit | Attending: Pulmonary Disease | Admitting: Pulmonary Disease

## 2021-11-11 ENCOUNTER — Encounter: Payer: Self-pay | Admitting: Urgent Care

## 2021-11-11 DIAGNOSIS — I700 Atherosclerosis of aorta: Secondary | ICD-10-CM | POA: Insufficient documentation

## 2021-11-11 DIAGNOSIS — R911 Solitary pulmonary nodule: Secondary | ICD-10-CM | POA: Diagnosis not present

## 2021-11-11 DIAGNOSIS — E785 Hyperlipidemia, unspecified: Secondary | ICD-10-CM | POA: Insufficient documentation

## 2021-11-11 DIAGNOSIS — Z01810 Encounter for preprocedural cardiovascular examination: Secondary | ICD-10-CM | POA: Diagnosis not present

## 2021-11-11 DIAGNOSIS — I2510 Atherosclerotic heart disease of native coronary artery without angina pectoris: Secondary | ICD-10-CM | POA: Diagnosis not present

## 2021-11-11 DIAGNOSIS — Z01812 Encounter for preprocedural laboratory examination: Secondary | ICD-10-CM | POA: Diagnosis present

## 2021-11-11 DIAGNOSIS — Z20822 Contact with and (suspected) exposure to covid-19: Secondary | ICD-10-CM

## 2021-11-11 DIAGNOSIS — R918 Other nonspecific abnormal finding of lung field: Secondary | ICD-10-CM | POA: Diagnosis not present

## 2021-11-11 DIAGNOSIS — J432 Centrilobular emphysema: Secondary | ICD-10-CM | POA: Insufficient documentation

## 2021-11-11 DIAGNOSIS — J439 Emphysema, unspecified: Secondary | ICD-10-CM | POA: Diagnosis not present

## 2021-11-11 LAB — GLUCOSE, CAPILLARY: Glucose-Capillary: 97 mg/dL (ref 70–99)

## 2021-11-11 MED ORDER — FLUDEOXYGLUCOSE F - 18 (FDG) INJECTION
8.7000 | Freq: Once | INTRAVENOUS | Status: AC | PRN
Start: 2021-11-11 — End: 2021-11-11
  Administered 2021-11-11: 9.03 via INTRAVENOUS

## 2021-11-12 LAB — SARS CORONAVIRUS 2 (TAT 6-24 HRS): SARS Coronavirus 2: NEGATIVE

## 2021-11-12 MED ORDER — CHLORHEXIDINE GLUCONATE 0.12 % MT SOLN: 15.0000 mL | Freq: Once | OROMUCOSAL | Status: AC

## 2021-11-12 MED ORDER — FAMOTIDINE 20 MG PO TABS: 20.0000 mg | ORAL_TABLET | Freq: Once | ORAL | Status: AC

## 2021-11-12 MED ORDER — SODIUM CHLORIDE 0.9 % IV SOLN: Freq: Once | INTRAVENOUS | Status: DC

## 2021-11-12 MED ORDER — LACTATED RINGERS IV SOLN: INTRAVENOUS | Status: DC

## 2021-11-12 MED ORDER — IPRATROPIUM-ALBUTEROL 0.5-2.5 (3) MG/3ML IN SOLN: 3.0000 mL | Freq: Once | RESPIRATORY_TRACT | Status: AC

## 2021-11-12 MED ORDER — ORAL CARE MOUTH RINSE: 15.0000 mL | Freq: Once | OROMUCOSAL | Status: AC

## 2021-11-13 ENCOUNTER — Ambulatory Visit: Payer: Medicare Other | Admitting: Urgent Care

## 2021-11-13 ENCOUNTER — Ambulatory Visit: Payer: Medicare Other

## 2021-11-13 ENCOUNTER — Ambulatory Visit
Admission: RE | Admit: 2021-11-13 | Discharge: 2021-11-13 | Disposition: A | Discharge status: Home / Self Care | Payer: Medicare Other | Source: Ambulatory Visit | Attending: Pulmonary Disease | Admitting: Pulmonary Disease

## 2021-11-13 ENCOUNTER — Encounter: Payer: Self-pay | Admitting: Pulmonary Disease

## 2021-11-13 ENCOUNTER — Other Ambulatory Visit: Payer: Self-pay

## 2021-11-13 ENCOUNTER — Encounter
Admission: RE | Disposition: A | Discharge status: Home / Self Care | Payer: Self-pay | Source: Ambulatory Visit | Attending: Pulmonary Disease

## 2021-11-13 DIAGNOSIS — M797 Fibromyalgia: Secondary | ICD-10-CM | POA: Insufficient documentation

## 2021-11-13 DIAGNOSIS — R911 Solitary pulmonary nodule: Secondary | ICD-10-CM | POA: Insufficient documentation

## 2021-11-13 DIAGNOSIS — C3412 Malignant neoplasm of upper lobe, left bronchus or lung: Secondary | ICD-10-CM | POA: Insufficient documentation

## 2021-11-13 DIAGNOSIS — F17210 Nicotine dependence, cigarettes, uncomplicated: Secondary | ICD-10-CM | POA: Diagnosis not present

## 2021-11-13 DIAGNOSIS — M1990 Unspecified osteoarthritis, unspecified site: Secondary | ICD-10-CM | POA: Insufficient documentation

## 2021-11-13 DIAGNOSIS — R918 Other nonspecific abnormal finding of lung field: Secondary | ICD-10-CM | POA: Diagnosis not present

## 2021-11-13 DIAGNOSIS — F32A Depression, unspecified: Secondary | ICD-10-CM | POA: Insufficient documentation

## 2021-11-13 DIAGNOSIS — R590 Localized enlarged lymph nodes: Secondary | ICD-10-CM | POA: Diagnosis not present

## 2021-11-13 SURGERY — BRONCHOSCOPY, WITH BIOPSY USING ELECTROMAGNETIC NAVIGATION
Anesthesia: General | Laterality: Left

## 2021-11-13 MED ORDER — PROPOFOL 10 MG/ML IV BOLUS
INTRAVENOUS | Status: DC | PRN
  Administered 2021-11-13: 200 mg via INTRAVENOUS

## 2021-11-13 MED ORDER — GLYCOPYRROLATE 0.2 MG/ML IJ SOLN
INTRAMUSCULAR | Status: DC | PRN
  Administered 2021-11-13: .2 mg via INTRAVENOUS

## 2021-11-13 MED ORDER — FENTANYL CITRATE (PF) 100 MCG/2ML IJ SOLN
INTRAMUSCULAR | Status: AC
  Filled 2021-11-13: qty 2

## 2021-11-13 MED ORDER — SUGAMMADEX SODIUM 200 MG/2ML IV SOLN
INTRAVENOUS | Status: DC | PRN
  Administered 2021-11-13: 100 mg via INTRAVENOUS
  Administered 2021-11-13 (×2): 50 mg via INTRAVENOUS

## 2021-11-13 MED ORDER — ROCURONIUM BROMIDE 100 MG/10ML IV SOLN
INTRAVENOUS | Status: DC | PRN
  Administered 2021-11-13: 20 mg via INTRAVENOUS
  Administered 2021-11-13: 50 mg via INTRAVENOUS

## 2021-11-13 MED ORDER — IPRATROPIUM-ALBUTEROL 0.5-2.5 (3) MG/3ML IN SOLN
RESPIRATORY_TRACT | Status: AC
  Filled 2021-11-13: qty 3

## 2021-11-13 MED ORDER — IPRATROPIUM-ALBUTEROL 0.5-2.5 (3) MG/3ML IN SOLN
RESPIRATORY_TRACT | Status: AC
  Administered 2021-11-13: 3 mL via RESPIRATORY_TRACT
  Filled 2021-11-13: qty 3

## 2021-11-13 MED ORDER — FAMOTIDINE 20 MG PO TABS
ORAL_TABLET | ORAL | Status: AC
  Administered 2021-11-13: 20 mg via ORAL
  Filled 2021-11-13: qty 1

## 2021-11-13 MED ORDER — LIDOCAINE HCL (CARDIAC) PF 100 MG/5ML IV SOSY
PREFILLED_SYRINGE | INTRAVENOUS | Status: DC | PRN
  Administered 2021-11-13: 80 mg via INTRAVENOUS

## 2021-11-13 MED ORDER — ESMOLOL HCL 100 MG/10ML IV SOLN
INTRAVENOUS | Status: DC | PRN
  Administered 2021-11-13 (×4): 20 mg via INTRAVENOUS

## 2021-11-13 MED ORDER — PROPOFOL 10 MG/ML IV BOLUS
INTRAVENOUS | Status: AC
  Filled 2021-11-13: qty 20

## 2021-11-13 MED ORDER — FENTANYL CITRATE (PF) 100 MCG/2ML IJ SOLN
INTRAMUSCULAR | Status: DC | PRN
  Administered 2021-11-13 (×2): 50 ug via INTRAVENOUS

## 2021-11-13 MED ORDER — CHLORHEXIDINE GLUCONATE 0.12 % MT SOLN
OROMUCOSAL | Status: AC
  Administered 2021-11-13: 15 mL via OROMUCOSAL
  Filled 2021-11-13: qty 15

## 2021-11-13 MED ORDER — DEXAMETHASONE SODIUM PHOSPHATE 10 MG/ML IJ SOLN
INTRAMUSCULAR | Status: DC | PRN
  Administered 2021-11-13: 10 mg via INTRAVENOUS

## 2021-11-13 MED ORDER — ONDANSETRON HCL 4 MG/2ML IJ SOLN
INTRAMUSCULAR | Status: DC | PRN
  Administered 2021-11-13: 4 mg via INTRAVENOUS

## 2021-11-13 MED ORDER — IPRATROPIUM-ALBUTEROL 0.5-2.5 (3) MG/3ML IN SOLN
3.0000 mL | Freq: Once | RESPIRATORY_TRACT | Status: AC
  Administered 2021-11-13: 3 mL via RESPIRATORY_TRACT

## 2021-11-13 NOTE — Transfer of Care (Signed)
Immediate Anesthesia Transfer of Care Note  Patient: [name] [name]  Procedure(s) Performed: ROBOTIC ASSISTED NAVIGATIONAL BRONCHOSCOPY (Left)  Patient Location: PACU  Anesthesia Type:General  Level of Consciousness: awake, alert  and oriented  Airway & Oxygen Therapy: Patient Spontanous Breathing and Patient connected to face mask oxygen  Post-op Assessment: Report given to RN  Post vital signs: Reviewed and stable  Last Vitals:  Vitals Value Taken Time  BP 160/83 11/13/21 1439  Temp    Pulse 64 11/13/21 1441  Resp 15 11/13/21 1441  SpO2 99 % 11/13/21 1441  Vitals shown include unvalidated device data.  Last Pain:  Vitals:   11/13/21 1136  TempSrc: Temporal  PainSc: 0-No pain      Patients Stated Pain Goal: 0 (64/15/83 0940)  Complications: No notable events documented.

## 2021-11-13 NOTE — Interval H&P Note (Signed)
[name] [name]. Brophy has presented today for surgery, with the diagnosis of left upper lobe nodule and left hilar lymphadenopathy.  The various methods of treatment have been discussed with the patient and family. After consideration of risks, benefits and other options for treatment, the patient has consented to  Procedure(s): Robotic Assisted Navigational Bronchoscopy, Bronchoscopy and Endobronchial Ultrasound-LEFT as a surgical intervention.  The patient's history has been reviewed, patient examined, no change in status, stable for surgery.  I have reviewed the patient's chart and labs.  Questions were answered to the patient's satisfaction.  Benefits, limitations and potential complications of the procedure were discussed with the patient/family.  Complications from bronchoscopy are rare and most often minor, but if they occur they may include breathing difficulty, vocal cord spasm, hoarseness, slight fever, vomiting, dizziness, bronchospasm, infection, low blood oxygen, bleeding from biopsy site, or an allergic reaction to medications.  It is uncommon for patients to experience other more serious complications for example: Collapsed lung requiring chest tube placement, respiratory failure, heart attack and/or cardiac arrhythmia.  She does agree to proceed.   Renold Don, MD Advanced Bronchoscopy PCCM [place]    *This note was dictated using voice recognition software/Dragon.  Despite best efforts to proofread, errors can occur which can change the meaning. Any transcriptional errors that result from this process are unintentional and may not be fully corrected at the time of dictation.

## 2021-11-13 NOTE — Anesthesia Procedure Notes (Signed)
Procedure Name: Intubation Date/Time: 11/13/2021 1:11 PM  Performed by: Gayland Curry, CRNAPre-anesthesia Checklist: Patient identified, Emergency Drugs available, Suction available and Patient being monitored Patient Re-evaluated:Patient Re-evaluated prior to induction Oxygen Delivery Method: Circle system utilized Preoxygenation: Pre-oxygenation with 100% oxygen Induction Type: IV induction Ventilation: Mask ventilation without difficulty and Oral airway inserted - appropriate to patient size Laryngoscope Size: McGraph and 3 Tube type: Oral Tube size: 9.0 mm Number of attempts: 1 Airway Equipment and Method: Stylet Placement Confirmation: ETT inserted through vocal cords under direct vision, positive ETCO2 and breath sounds checked- equal and bilateral Secured at: 21 cm Tube secured with: Tape Dental Injury: Teeth and Oropharynx as per pre-operative assessment

## 2021-11-13 NOTE — Anesthesia Preprocedure Evaluation (Addendum)
Anesthesia Evaluation  Patient identified by MRN, date of birth, ID band Patient awake    Reviewed: Allergy & Precautions, NPO status , Patient's Chart, lab work & pertinent test results  History of Anesthesia Complications Negative for: history of anesthetic complications  Airway Mallampati: II  TM Distance: >3 FB Neck ROM: Full    Dental no notable dental hx. (+) Edentulous Upper, Edentulous Lower   Pulmonary neg pulmonary ROS, Current Smoker and Patient abstained from smoking.,  LUL pulmonary nodule     Pulmonary exam normal breath sounds clear to auscultation       Cardiovascular Exercise Tolerance: Good negative cardio ROS Normal cardiovascular exam Rhythm:Regular Rate:Normal     Neuro/Psych PSYCHIATRIC DISORDERS Depression negative neurological ROS     GI/Hepatic negative GI ROS, Neg liver ROS,   Endo/Other  negative endocrine ROS  Renal/GU negative Renal ROS  negative genitourinary   Musculoskeletal  (+) Arthritis , Fibromyalgia -  Abdominal   Peds negative pediatric ROS (+)  Hematology negative hematology ROS (+)   Anesthesia Other Findings   Reproductive/Obstetrics negative OB ROS                            Anesthesia Physical Anesthesia Plan  ASA: 2  Anesthesia Plan: General   Post-op Pain Management: Minimal or no pain anticipated   Induction: Intravenous  PONV Risk Score and Plan: 2 and Ondansetron, Dexamethasone and Treatment may vary due to age or medical condition  Airway Management Planned: Oral ETT  Additional Equipment:   Intra-op Plan:   Post-operative Plan: Extubation in OR  Informed Consent: I have reviewed the patients History and Physical, chart, labs and discussed the procedure including the risks, benefits and alternatives for the proposed anesthesia with the patient or authorized representative who has indicated his/her understanding and  acceptance.     Dental Advisory Given  Plan Discussed with: Anesthesiologist, CRNA and Surgeon  Anesthesia Plan Comments: (Patient consented for risks of anesthesia including but not limited to:  - adverse reactions to medications - risk of airway placement if required - damage to eyes, teeth, lips or other oral mucosa - nerve damage due to positioning  - sore throat or hoarseness - Damage to heart, brain, nerves, lungs, other parts of body or loss of life  Patient voiced understanding.)        Anesthesia Quick Evaluation

## 2021-11-13 NOTE — Op Note (Signed)
PROCEDURES:  Robotic assisted bronchoscopy Cellvizio probe based confocal laser endomicroscopy (pCLE) Augmented fluoroscopy Endobronchial ultrasound with TBNA   Indication: Spiculated lesion in the apical left upper lobe measuring 2.5 x 1.2 cm which is hypermetabolic on PET/CT.  Mild metabolic nodularity along the left hilum without distinct lymph node noted on PET/CT, patient will need robotic assisted bronchoscopy for evaluation of left upper lobe findings and endobronchial ultrasound for evaluation of hilar findings.  Preoperative Diagnosis: Left upper lobe spiculated nodule/left hilar adenopathy Post Procedure Diagnosis: Same as above, rule out CA Consent: Verbal/Written: obtained  Benefits, limitations and potential complications of the procedure were discussed with the patient/family.  Complications from bronchoscopy are rare and most often minor, but if they occur they may include breathing difficulty, vocal cord spasm, hoarseness, slight fever, vomiting, dizziness, bronchospasm, infection, low blood oxygen, bleeding from biopsy site, or an allergic reaction to medications.  It is uncommon for patients to experience other more serious complications for example: Collapsed lung requiring chest tube placement, respiratory failure, heart attack and/or cardiac arrhythmia.  Patient understood the potential complications and agreed to proceed.  Surgeon: Renold Don, MD Assistant/Scrub: Liborio Nixon, RRT Circulator: N/A Anesthesiologist/CRNA: Jane Canary.[place] [employer], MD/Grace Moshe Cipro, CRNA Cytotechnology: Maryan Puls Fluoroscopy technician: Theron Arista, RT Representatives: Shon Millet, Katrine Coho ([employer])  Type of Anesthesia: General endotracheal  Procedures Performed:   Robotic bronchoscopy: Procedure consists of robotic navigation comprised of electromagnetics, optical pattern recognition and robotic kinematic data - to triangulate bronchoscope location during the procedure  and provide accurate positional data to biopsy a lesion. Cellvizio probe based confocal laser endomicroscopy (pCLE) utilizing blue laser endomicroscopy. Augmented fluoroscopy with Body Vision.  Description of the Procedures:  Robotic bronchoscopy: The patient was brought to Procedure Room 2 (Bronchoscopy Suite) in the OR area where appropriate timeout was taken with the staff after the patient was inducted under general anesthesia.  The patient was inducted under general anesthesia and intubated by the anesthesia team.  Patient was intubated with a 9.0 ET tube without difficulty.  Tube was secured at 4 cm above the carina.  A Portex adapter was placed on the ET tube flange.  Once the patient was under adequate general anesthesia the Olympus therapeutic video bronchoscope was advanced and an anatomic airway tour and surveillance bronchoscopy was performed.The distal trachea appeared unremarkable. The main carina was sharp.  No secretions were seen in either right or left mainstem bronchi. The RUL, RLL, RML appeared to be free of endobronchial masses, lesions, or purulent secretions. Likewise, the LLL/LUL appeared to be free of endobronchial masses, lesions, or purulent secretions.  There were some scant benign appearing secretions on the LUL that were suctioned until cleared.  Once the survey bronchoscopy was completed, registration for the augmented fluoroscopy (Body Vision) was then performed with the fluoroscopic C arm.  Once this was completed, the robotic bronchoscope ET tube adapter was placed and ETT was cut to proper length and secured on the mid plane.  The [facility] robotic scope was then advanced through the ETT and registration was performed successfully.  There was good correlation between the robotic mapping and bronchoscopic mapping. With the assistance of fused navigation, the bronchoscope was advanced to the LUL nodule. The tip of the working channel sat within 10 mm of the nodule  Positioning  was confirmed with augmented fluoroscopy.  At this point Cellvizio probe based confocal laser endomicroscopy (pCLE) was utilized to confirm target acquisition.  The images through [facility] were consistent with area of interest changes  and inflammatory changes.   Augmented fluoroscopy via Body Vision was utilized to optimize the position most favorable for biopsies, then the robotic bronchoscope was anchored to maintain position.  At this point two passes with a cytology brush on the LUL nodule/ were performed, the brushes were cut into CytoLyt preservative.  ROSE on the first pass showed intense inflammatory changes and some atypia.  At this point ensuring good hemostasis, 12 transbronchial biopsies were obtained, the lesion could be seen moving during biopsies.  ROSE evaluation on the transbronchial biopsies showed atypical cells surrounded by inflammation.  After completion of the biopsies, 2 passes with the Olympus Smooth Shot anyone gauge TBNA needle were performed, material was placed on CytoLyt.  An additional 2 passes with a cytology brush were performed at this point and the brushes were cut into CytoLyt material. A BAL was also obtained at this segment, which sent for cytology analysis.  For BAL sample acquisition purposes, 20 ml of normal saline were instilled, and approximately 8 ml were recovered/trapped and sent for analysis.  This concluded the robotic portion of the procedure.  Endobronchial ultrasound: The robotic bronchoscope was then retracted all the way out after confirmation of excellent hemostasis.  At this point, the Olympus endobronchial ultrasound (EBUS) scope was then introduced.  The mediastinum was examined there were no paratracheal nodes noted.  The subcarinal space did not show any adenopathy.  Right hilar area did not show any adenopathy.  Inspecting the left mediastinal structures there was a 10 x 0.9 mm lymph node noted in station 10 L, no other adenopathy was noted on the left.   The 10 L node was biopsied with a 21-gauge Olympus VIZI shot EBUS needle.  3 passes were performed.  Material was placed in CytoLyt media.  ROSE was not performed trying to save as much of the specimen for cell block given the small size of the lymph node.  Having completed this portion of the procedure there was minimal heme noted on the left mainstem bronchus, this was lavaged until clear.  Patient then received 10 mL of 1% lidocaine via bronchial lavage and the bronchoscope was retrieved.  The procedure was at this point terminated.  Patient was allowed to emerge from general anesthesia and was transferred to the PACU in satisfactory condition. Auscultation of the lungs showed no change from pre bronchoscopy examination.  Patient tolerated the procedure very well with no untoward effects of anesthesia noted.   Specimens Obtained:  Transbronchial Forceps Biopsy: X12, LUL  Transbronchial Brush: X4, LUL  Transbronchial needle aspirate: X2, LUL  Targeted BAL: 8 mL, LUL  EBUS TBNA: X2, station 10 L  Fluoroscopy: Augmented fluoroscopy (Body Vision) was utilized during the course of this procedure to assure that biopsies were taken in a safe manner under fluoroscopic guidance with spot films required.  Total fluoroscopy time: 6 minutes 25 seconds, total dose 34.61 mGy  Intraoperative images:  Fluoroscopic image with bronchoscope anchored:   Scope at target:   Inflammatory changes surrounding lesion noted with Cellvizio:   Cellvizio image of targeted lesion, concerning for malignancy:   10 L lymph node 10 x 9 mm (arrow):   TBNA needle in lymph node:   Complications:None, no pneumothorax on post film:   Estimated Blood Loss: Nil    Assessment and Plan/Additional Comments: Left upper lobe 2.5 x 1.2 cm lesion, status post robotic bronchoscopy biopsy Left hilar adenopathy, that is post EBUS TBNA Await pathology reports Patient has appropriate follow-ups  Renold Don,  MD Advanced Bronchoscopy PCCM [place] [facility]    *This note was dictated using voice recognition software/Dragon.  Despite best efforts to proofread, errors can occur which can change the meaning.  Any change was purely unintentional.

## 2021-11-13 NOTE — Discharge Instructions (Addendum)
AMBULATORY SURGERY  DISCHARGE INSTRUCTIONS   The drugs that you were given will stay in your system until tomorrow so for the next 24 hours you should not:  Drive an automobile Make any legal decisions Drink any alcoholic beverage   You may resume regular meals tomorrow.  Today it is better to start with liquids and gradually work up to solid foods.  You may eat anything you prefer, but it is better to start with liquids, then soup and crackers, and gradually work up to solid foods.   Please notify your doctor immediately if you have any unusual bleeding, trouble breathing, redness and pain at the surgery site, drainage, fever, or pain not relieved by medication.    Additional Instructions:        Please contact your physician with any problems or Same Day Surgery at [phone], Monday through Friday 6 am to 4 pm, or [place] at [facility] number at [phone]. brond

## 2021-11-14 NOTE — Anesthesia Postprocedure Evaluation (Signed)
Anesthesia Post Note  Patient: [name] [name]  Procedure(s) Performed: ROBOTIC ASSISTED NAVIGATIONAL BRONCHOSCOPY (Left)  Patient location during evaluation: PACU Anesthesia Type: General Level of consciousness: awake Pain management: satisfactory to patient Vital Signs Assessment: post-procedure vital signs reviewed and stable Respiratory status: spontaneous breathing and respiratory function stable Cardiovascular status: stable Anesthetic complications: no   No notable events documented.   Last Vitals:  Vitals:   11/13/21 1521 11/13/21 1523  BP:  (!) 165/70  Pulse: 68   Resp: 20   Temp: (!) 36.1 C   SpO2: 95%     Last Pain:  Vitals:   11/13/21 1521  TempSrc: Temporal  PainSc:                  VAN STAVEREN,Jovan Colligan

## 2021-11-15 ENCOUNTER — Telehealth: Payer: Self-pay

## 2021-11-15 ENCOUNTER — Other Ambulatory Visit: Payer: Self-pay | Admitting: Pathology

## 2021-11-15 DIAGNOSIS — R911 Solitary pulmonary nodule: Secondary | ICD-10-CM

## 2021-11-15 LAB — CYTOLOGY - NON PAP

## 2021-11-15 LAB — SURGICAL PATHOLOGY

## 2021-11-15 NOTE — Telephone Encounter (Signed)
Tyler Pita, MD  11/15/2021  1:59 PM EDT Back to Top    Patient is aware of results.  Also aware that final lymph node read is pending.  In the interim we will proceed with obtaining PFTs, expedited, patient may be surgical candidate for resection of squamous cell carcinoma left upper lobe.    PFT has been ordered.  Nothing further needed

## 2021-11-19 ENCOUNTER — Telehealth: Payer: Self-pay | Admitting: Pulmonary Disease

## 2021-11-19 NOTE — Telephone Encounter (Signed)
[name] [name], please advise. Thanks.  Can offer [place].

## 2021-11-19 NOTE — Telephone Encounter (Signed)
Patient is call regarding a breathing test she said the doctor was supposed to schedule for her.  She stated she has not heard anything yet.  Please call patient to discuss at [phone]

## 2021-11-20 NOTE — Telephone Encounter (Signed)
[name] [name], pt is calling back Can you please contact her about the scheduling her PFT, thanks!

## 2021-11-21 ENCOUNTER — Other Ambulatory Visit: Payer: Self-pay | Admitting: *Deleted

## 2021-11-21 ENCOUNTER — Other Ambulatory Visit: Payer: Medicare Other

## 2021-11-21 DIAGNOSIS — J449 Chronic obstructive pulmonary disease, unspecified: Secondary | ICD-10-CM

## 2021-11-21 DIAGNOSIS — R911 Solitary pulmonary nodule: Secondary | ICD-10-CM

## 2021-11-21 NOTE — Progress Notes (Signed)
Tumor Board Documentation  [name] [name] was presented by Dr Patsey Berthold at our Tumor Board on 11/21/2021, which included representatives from medical oncology, pathology, research, navigation, radiation oncology, pharmacy, genetics, surgical, radiology, pulmonology.  [name] [name] currently presents as an external consult, for new positive pathology with history of the following treatments: active survellience, surgical intervention(s).  Additionally, we reviewed previous medical and familial history, history of present illness, and recent lab results along with all available histopathologic and imaging studies. The tumor board considered available treatment options and made the following recommendations: Surgery, Additional screening (PFT's)    The following procedures/referrals were also placed: No orders of the defined types were placed in this encounter.   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  Tumor board is a meeting of clinicians from various specialty areas who evaluate and discuss patients for whom a multidisciplinary approach is being considered. Final determinations in the plan of care are those of the provider(s). The responsibility for follow up of recommendations given during tumor board is that of the provider.   Today's extended care, comprehensive team conference, [name] [name] was not present for the discussion and was not examined.   Multidisciplinary Tumor Board is a multidisciplinary case peer review process.  Decisions discussed in the Multidisciplinary Tumor Board reflect the opinions of the specialists present at the conference without having examined the patient.  Ultimately, treatment and diagnostic decisions rest with the primary provider(s) and the patient.

## 2021-11-21 NOTE — Progress Notes (Signed)
pft  

## 2021-11-22 NOTE — Telephone Encounter (Signed)
I have spoke with [name] [name] and her PFT has been scheduled on 11/25/21 @ 8:00am and she is aware of the instructions

## 2021-11-25 ENCOUNTER — Ambulatory Visit: Payer: Medicare Other | Attending: Pulmonary Disease

## 2021-11-25 DIAGNOSIS — R911 Solitary pulmonary nodule: Secondary | ICD-10-CM | POA: Diagnosis not present

## 2021-11-25 DIAGNOSIS — F17210 Nicotine dependence, cigarettes, uncomplicated: Secondary | ICD-10-CM | POA: Insufficient documentation

## 2021-11-25 DIAGNOSIS — J449 Chronic obstructive pulmonary disease, unspecified: Secondary | ICD-10-CM

## 2021-11-25 DIAGNOSIS — R059 Cough, unspecified: Secondary | ICD-10-CM | POA: Diagnosis not present

## 2021-11-25 LAB — PULMONARY FUNCTION TEST ARMC ONLY
DL/VA % pred: 74 %
DL/VA: 3.06 ml/min/mmHg/L
DLCO unc % pred: 73 %
DLCO unc: 14.23 ml/min/mmHg
FEF 25-75 Post: 1.77 L/sec
FEF 25-75 Pre: 1.6 L/sec
FEF2575-%Change-Post: 10 %
FEF2575-%Pred-Post: 98 %
FEF2575-%Pred-Pre: 88 %
FEV1-%Change-Post: 2 %
FEV1-%Pred-Post: 90 %
FEV1-%Pred-Pre: 88 %
FEV1-Post: 2 L
FEV1-Pre: 1.95 L
FEV1FVC-%Change-Post: 6 %
FEV1FVC-%Pred-Pre: 101 %
FEV6-%Change-Post: -3 %
FEV6-%Pred-Post: 87 %
FEV6-%Pred-Pre: 90 %
FEV6-Post: 2.44 L
FEV6-Pre: 2.53 L
FEV6FVC-%Change-Post: 0 %
FEV6FVC-%Pred-Post: 104 %
FEV6FVC-%Pred-Pre: 104 %
FVC-%Change-Post: -3 %
FVC-%Pred-Post: 83 %
FVC-%Pred-Pre: 86 %
FVC-Post: 2.44 L
FVC-Pre: 2.54 L
Post FEV1/FVC ratio: 82 %
Post FEV6/FVC ratio: 100 %
Pre FEV1/FVC ratio: 77 %
Pre FEV6/FVC Ratio: 100 %
RV % pred: 148 %
RV: 3.32 L
TLC % pred: 114 %
TLC: 5.77 L

## 2021-11-25 MED ORDER — ALBUTEROL SULFATE (2.5 MG/3ML) 0.083% IN NEBU
2.5000 mg | INHALATION_SOLUTION | Freq: Once | RESPIRATORY_TRACT | Status: AC
  Administered 2021-11-25: 2.5 mg via RESPIRATORY_TRACT
  Filled 2021-11-25: qty 3

## 2021-11-26 ENCOUNTER — Encounter: Payer: Self-pay | Admitting: Pulmonary Disease

## 2021-11-26 ENCOUNTER — Ambulatory Visit: Payer: Medicare Other | Admitting: Pulmonary Disease

## 2021-11-26 VITALS — BP 128/70 | HR 77 | Temp 98.1°F | Ht 64.0 in | Wt 171.0 lb

## 2021-11-26 DIAGNOSIS — F17210 Nicotine dependence, cigarettes, uncomplicated: Secondary | ICD-10-CM

## 2021-11-26 DIAGNOSIS — Z01811 Encounter for preprocedural respiratory examination: Secondary | ICD-10-CM | POA: Diagnosis not present

## 2021-11-26 DIAGNOSIS — J449 Chronic obstructive pulmonary disease, unspecified: Secondary | ICD-10-CM | POA: Diagnosis not present

## 2021-11-26 DIAGNOSIS — C3412 Malignant neoplasm of upper lobe, left bronchus or lung: Secondary | ICD-10-CM

## 2021-11-26 MED ORDER — TRELEGY ELLIPTA 100-62.5-25 MCG/ACT IN AEPB: 1.0000 | INHALATION_SPRAY | Freq: Every day | RESPIRATORY_TRACT | 0 refills | Status: DC

## 2021-11-26 NOTE — Progress Notes (Signed)
   Subjective:    Patient ID: [name] [name], female    DOB: 1950-08-04, 71 y.o.   MRN: [id number] Patient Care Team: Tower, [name], MD as PCP - General  Chief Complaint  Patient presents with  . Follow-up    No current sx     HPI    Review of Systems   Current Meds  Medication Sig  . aspirin 81 MG tablet Take 81 mg by mouth daily.    Marland Kitchen atorvastatin (LIPITOR) 10 MG tablet TAKE 1 TABLET BY MOUTH EVERY DAY  . busPIRone (BUSPAR) 15 MG tablet TAKE 1/2 TABLET BY MOUTH TWICE A DAY  . DULoxetine (CYMBALTA) 60 MG capsule TAKE 1 CAPSULE BY MOUTH EVERY DAY  . Fluticasone-Umeclidin-Vilant (TRELEGY ELLIPTA) 100-62.5-25 MCG/ACT AEPB Inhale 1 puff into the lungs daily.  . Fluticasone-Umeclidin-Vilant (TRELEGY ELLIPTA) 100-62.5-25 MCG/ACT AEPB Inhale 1 puff into the lungs daily.  . Melatonin 10 MG CAPS Take 20 mg by mouth at bedtime.  . naproxen sodium (ALEVE) 220 MG tablet Take 440 mg by mouth daily as needed (headaches).  . pregabalin (LYRICA) 75 MG capsule TAKE 1 CAPSULE BY MOUTH TWICE A DAY  . traMADol (ULTRAM) 50 MG tablet TAKE 1 TABLET BY MOUTH EVERY 8 HOURS AS NEEDED. NOT COVERED BY INSURANCE.       Objective:   Physical Exam BP 128/70 (BP Location: Left Arm, Cuff Size: Normal)   Pulse 77   Temp 98.1 F (36.7 C) (Temporal)   Ht 5\' 4"  (1.626 m)   Wt 171 lb (77.6 kg)   SpO2 99%   BMI 29.35 kg/m  GENERAL: Well-developed somewhat overweight woman, no acute distress. HEAD: Normocephalic, atraumatic.  EYES: Pupils equal, round, reactive to light.  No scleral icterus.  MOUTH: Dentures uppers and lowers, oral mucosa moist, Mallampati III airway. NECK: Supple. No thyromegaly. Trachea midline. No JVD.  No adenopathy. PULMONARY: Good air entry bilaterally.  Diffuse wheezing and rhonchi noted bilaterally. CARDIOVASCULAR: S1 and S2. Regular rate and rhythm.  ABDOMEN: Benign. MUSCULOSKELETAL: No joint deformity, no clubbing, no edema.  NEUROLOGIC:  SKIN: Intact,warm,dry. PSYCH:  Mood and behavior normal.       Assessment & Plan:

## 2021-11-26 NOTE — Patient Instructions (Signed)
Continue taking the Trelegy 100.  1 puff daily.  Rinse your mouth well after you use it.  Keep working on quitting smoking.  This is important particularly if you are to have surgery in the lung.  We are referring you to Dr. Merilynn Finland, Thoracic Surgeon in [place].  We will see you in follow-up in 3 months time call sooner should any new problems arise.

## 2021-11-28 ENCOUNTER — Encounter: Payer: Self-pay | Admitting: Pulmonary Disease

## 2021-12-05 ENCOUNTER — Encounter: Payer: Self-pay | Admitting: Thoracic Surgery (Cardiothoracic Vascular Surgery)

## 2021-12-05 ENCOUNTER — Other Ambulatory Visit: Payer: Self-pay | Admitting: *Deleted

## 2021-12-05 ENCOUNTER — Ambulatory Visit: Payer: Medicare Other | Admitting: Thoracic Surgery (Cardiothoracic Vascular Surgery)

## 2021-12-05 DIAGNOSIS — C3412 Malignant neoplasm of upper lobe, left bronchus or lung: Secondary | ICD-10-CM

## 2021-12-05 DIAGNOSIS — C3490 Malignant neoplasm of unspecified part of unspecified bronchus or lung: Secondary | ICD-10-CM | POA: Insufficient documentation

## 2021-12-05 NOTE — Progress Notes (Signed)
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  [name] [name] is a 71 year old woman with a history of tobacco abuse, fibromyalgia, hyperlipidemia, mild COPD, endometriosis, arthritis, post herpetic neuralgia, and depression.  She has smoked about a pack a day for 55 years.  She recently had cut back to 1/2 pack/day.  She has not smoked in the past 2 days.  Recently she injured her right shoulder.  She had a x-ray which showed a question of a right upper lobe nodule.  A CT of the chest was done.  There was no right upper lobe nodule, but there was a left upper lobe nodule.  She was referred to Dr. Patsey Berthold.  A PET/CT showed the nodule was hypermetabolic.  There was a questionable node along the pulmonary artery.  No evidence of mediastinal or distant disease.  She underwent robotic bronchoscopy which showed non-small cell carcinoma.  Aspiration of mediastinal and hilar nodes was negative.  She has been feeling well.  She does have a productive cough which is chronic.  She says she was told she had wheezing on exam but has not had any symptomatic wheezing and has not had to use her bronchodilators.  She has some chronic issues with arthritis but no new bone or joint pain.  No headaches or visual changes or change in appetite or weight loss.  She denies chest pain, pressure, or tightness with exertion.  She is fairly active and still works part-time.  Zubrod Score: At the time of surgery this patient's most appropriate activity status/level should be described as: [x]     0    Normal activity, no symptoms []     1    Restricted in physical strenuous activity but ambulatory, able to do out light work []     2    Ambulatory and capable of self care, unable  to do work activities, up and about >50 % of waking hours                              []     3    Only limited self care, in bed greater than 50% of waking hours []     4    Completely disabled, no self care, confined to bed or chair []     5    Moribund  Past Medical History:  Diagnosis Date   Arthritis    OA   Carpal tunnel syndrome    Depression    Endometriosis    Fibromyalgia    Hyperlipidemia    Left upper lobe pulmonary nodule 11/2021   Post herpetic neuralgia    Sleep disorder    Tobacco abuse     Past Surgical History:  Procedure Laterality Date   ABDOMINAL HYSTERECTOMY  1992   total endometriosis   APPENDECTOMY  1992   bladder tack  1992   CATARACT EXTRACTION W/PHACO Right 03/25/2018   Procedure: CATARACT EXTRACTION PHACO AND INTRAOCULAR LENS PLACEMENT ([place]) RIGHT;  Surgeon: Marchia Meiers, MD;  Location: [facility] ORS;  Service: Ophthalmology;  Laterality: Right;  [place]  01:11 CDE 8.02 Fluid pack lot # [id number]   CATARACT EXTRACTION W/PHACO Left 04/22/2018   Procedure:  CATARACT EXTRACTION PHACO AND INTRAOCULAR LENS PLACEMENT ([place]) Left Eye;  Surgeon: Marchia Meiers, MD;  Location: [facility] OR[facility];  Service: Ophthalmology;  Laterality: Left;  [place]  01:02 CDE 8.11 Fluid pack lot # [id number]   COLONOSCOPY     FOOT SURGERY Right 04/2016   [facility] in [place] great toe; straightening others    Family History  Problem Relation Age of Onset   Heart disease Mother        MI   Heart disease Father        MI   Arthritis Sister        crippling arthritis   Arthritis Brother        crippling arthritis    Social History Social History   Tobacco Use   Smoking status: Every Day    Packs/day: 1.00    Years: 55.00    Total pack years: 55.00    Types: Cigarettes   Smokeless tobacco: Never   Tobacco comments:    0.5PPD 11/26/2021  Vaping Use   Vaping Use: Never used  Substance Use Topics   Alcohol use: No    Alcohol/week: 0.0 standard drinks of alcohol   Drug use: No     Current Outpatient Medications  Medication Sig Dispense Refill   aspirin 81 MG tablet Take 81 mg by mouth daily.       atorvastatin (LIPITOR) 10 MG tablet TAKE 1 TABLET BY MOUTH EVERY DAY 90 tablet 3   busPIRone (BUSPAR) 15 MG tablet TAKE 1/2 TABLET BY MOUTH TWICE A DAY 90 tablet 2   DULoxetine (CYMBALTA) 60 MG capsule TAKE 1 CAPSULE BY MOUTH EVERY DAY 90 capsule 2   Fluticasone-Umeclidin-Vilant (TRELEGY ELLIPTA) 100-62.5-25 MCG/ACT AEPB Inhale 1 puff into the lungs daily. 28 each 11   Fluticasone-Umeclidin-Vilant (TRELEGY ELLIPTA) 100-62.5-25 MCG/ACT AEPB Inhale 1 puff into the lungs daily. 14 each 0   Melatonin 10 MG CAPS Take 20 mg by mouth at bedtime.     naproxen sodium (ALEVE) 220 MG tablet Take 440 mg by mouth daily as needed (headaches).     pregabalin (LYRICA) 75 MG capsule TAKE 1 CAPSULE BY MOUTH TWICE A DAY 180 capsule 1   traMADol (ULTRAM) 50 MG tablet TAKE 1 TABLET BY MOUTH EVERY 8 HOURS AS NEEDED. NOT COVERED BY INSURANCE. 90 tablet 3   No current facility-administered medications for this visit.    No Known Allergies  Review of Systems  Constitutional:  Negative for activity change, fatigue and unexpected weight change.  HENT:  Negative for trouble swallowing and voice change.   Respiratory:  Positive for cough and wheezing. Negative for chest tightness and shortness of breath.   Cardiovascular:  Negative for chest pain, palpitations and leg swelling.  Genitourinary:  Negative for difficulty urinating and dysuria.  Musculoskeletal:  Positive for arthralgias and joint swelling.  Neurological:  Negative for seizures, syncope and weakness.  Hematological:  Negative for adenopathy. Does not bruise/bleed easily.    BP (!) 171/80 (BP Location: Left Arm, Patient Position: Sitting, Cuff Size: Normal)   Pulse 62   Resp 20   Ht 5\' 4"  (1.626 m)   Wt 172 lb 12.8 oz (78.4 kg)   SpO2 93%   BMI 29.66 kg/m  Physical Exam Vitals reviewed.  Constitutional:      General: She  is not in acute distress.    Appearance: Normal appearance.  HENT:     Head: Normocephalic and atraumatic.  Eyes:     General: No scleral icterus.  Extraocular Movements: Extraocular movements intact.  Neck:     Vascular: No carotid bruit.  Cardiovascular:     Rate and Rhythm: Normal rate and regular rhythm.     Heart sounds: Murmur (2/6 systolic murmur) heard.     No gallop.  Pulmonary:     Effort: Pulmonary effort is normal. No respiratory distress.     Breath sounds: Normal breath sounds. No wheezing or rales.  Abdominal:     General: There is no distension.     Palpations: Abdomen is soft.  Musculoskeletal:     Cervical back: Neck supple.  Lymphadenopathy:     Cervical: No cervical adenopathy.  Skin:    General: Skin is warm and dry.  Neurological:     General: No focal deficit present.     Mental Status: She is alert and oriented to person, place, and time.     Cranial Nerves: No cranial nerve deficit.     Motor: No weakness.    Diagnostic Tests: NUCLEAR MEDICINE PET SKULL BASE TO THIGH   TECHNIQUE: 9.03 mCi F-18 FDG was injected intravenously. Full-ring PET imaging was performed from the skull base to thigh after the radiotracer. CT data was obtained and used for attenuation correction and anatomic localization.   Fasting blood glucose: 97 mg/dl   COMPARISON:  Chest CT November 11, 2021   FINDINGS: Mediastinal blood pool activity: SUV max 1.6   Liver activity: SUV max NA   NECK: Hypermetabolic left thyroid nodule demonstrates a max SUV of 5.5.   No hypermetabolic cervical adenopathy.   Incidental CT findings: None.   CHEST: Hypermetabolic spiculated lesion in the apical left upper lobe contains nodular components and collectively better seen on same day chest CT measuring 2.5 x 1.2 cm on that examination with a max SUV of 13.8.   No additional hypermetabolic pulmonary nodules or masses.   Mildly metabolic nodularity along the left hilum on  axial fused image 86 with a max SUV of 2.9 without adenopathy may reflect local early nodal disease involvement.   Incidental CT findings: Aortic atherosclerosis.  Emphysema.   ABDOMEN/PELVIS: No abnormal hypermetabolic activity within the liver, pancreas, adrenal glands, or spleen.   No hypermetabolic lymph nodes in the abdomen or pelvis.   Incidental CT findings: Bilateral adrenal glands appear normal. Large volume of formed stool throughout the colon. Aortic atherosclerosis.   SKELETON: No focal hypermetabolic activity to suggest skeletal metastasis.   Incidental CT findings: Multilevel degenerative changes spine with multifocal degenerative joint disease.   IMPRESSION: 1. Hypermetabolic spiculated nodular left upper lobe pulmonary lesion is compatible with primary bronchogenic neoplasm. 2. Mildly metabolic nodularity in the left hilum, without discrete adenopathy may reflect early nodal disease involvement. Suggest attention on follow-up imaging. 3. No evidence of distant hypermetabolic metastatic disease. 4. Hypermetabolic nodule in the left lobe of the thyroid, Recommend thyroid US and biopsy (ref: J Am Coll Radiol. 2015 Feb;12(2): 143-50). 5. Aortic Atherosclerosis (ICD10-I70.0) and Emphysema (ICD10-J43.9).     Electronically Signed   By: Dahlia Bailiff M.D.   On: 11/12/2021 11:03 CT CHEST WITH CONTRAST   TECHNIQUE: Multidetector CT imaging of the chest was performed during intravenous contrast administration.   RADIATION DOSE REDUCTION: This exam was performed according to the departmental dose-optimization program which includes automated exposure control, adjustment of the mA and/or kV according to patient size and/or use of iterative reconstruction technique.   CONTRAST:  166mL ISOVUE-300 IOPAMIDOL (ISOVUE-300) INJECTION 61%   COMPARISON:  Chest radiographs dated 09/09/2021.  FINDINGS: Cardiovascular: Atheromatous calcifications, including the  coronary arteries and aorta. Dilated left atrium and left ventricle.   Mediastinum/Nodes: Small amount of superior recess pericardial fluid. No enlarged lymph nodes. Unremarkable esophagus and thyroid gland.   Lungs/Pleura: Superior left upper lobe nodule measuring 10 x 9 mm in maximum dimensions on image number 29/3. This corresponds to the nodule seen radiographically. This is at the inferior aspect of an area of pleural and parenchymal scarring.   No other lung nodules seen. No airspace consolidation or pleural fluid. Minimal linear atelectasis or scarring in the posterior left lower lobe.   Upper Abdomen: Areas of left renal cortical scarring. No adrenal or liver masses seen.   Musculoskeletal: Thoracic and lower cervical spine degenerative changes. No evidence of bony metastatic disease.   IMPRESSION: 1. 10 mm mean diameter nodule in the left upper lobe adjacent to an area of pleural and parenchymal scarring. Consider one of the following in 3 months for both low-risk and high-risk individuals: (a) repeat chest CT, (b) follow-up PET-CT, or (c) tissue sampling. This recommendation follows the consensus statement: Guidelines for Management of Incidental Pulmonary Nodules Detected on CT Images: From the Fleischner Society 2017; Radiology 2017; 284:228-243. 2.  Calcific coronary artery and aortic atherosclerosis. 3. Left atrial and left ventricular dilatation.   Aortic Atherosclerosis (ICD10-I70.0).     Electronically Signed   By: Claudie Revering M.D.   On: 09/17/2021 14:52  I personally reviewed the CT and PET/CT images.  There is a left upper lobe nodule or complex of nodules apically.  There is a 10 mm nodule that is markedly hypermetabolic and there are surrounding areas that have some activity as well.  There is some metabolic activity noted around the pulmonary artery that is not pathologically enlarged.  Probably reactive but possibly represents lymph node metastasis.   Coronary and aortic atherosclerosis.  Pulmonary function testing 11/25/2021 FVC 2.54 (86%) FEV1 1.95 (88%), no significant change with bronchodilators DLCO 14.23 (73%)  Impression: [name] [name] is a 71 year old woman with a history of tobacco abuse, fibromyalgia, hyperlipidemia, mild COPD, endometriosis, arthritis, post herpetic neuralgia, and depression.  She recently hurt her right shoulder which led to an x-ray.  There is a question of a right apical nodule.  CT showed there was no right apical nodule but there was one on the left.  The apical nodule was hypermetabolic on PET/CT.  Biopsy showed non-small cell carcinoma favoring squamous cell.  Mediastinal and hilar lymph node aspirations were negative.  Non-small cell carcinoma left upper lobe clinical stage Ia (T1, N0) is a little difficult to tell the actual size of the nodule because there is a complex shadow in the area.  This could be a 1 cm T1 a lesion or could be as large as 2.5 cm, either way it is a T1 lesion and is surgically resectable.  There are some question with some activity in the node along the pulmonary artery.  That most likely is reactive.  We will assess that node intraoperatively.  Based on the absence of pathologic adenopathy on the scan I would still stage that is N0 clinically.  We discussed treatment options including surgical resection and stereotactic radiation.  She understands these are equivalent better but potential options.  She favors surgical resection for the higher likelihood of a cure.  She does have adequate pulmonary reserve to tolerate resection.  I described the proposed operative procedure to her.  We will plan to do a robotic assisted left upper lobectomy.  I informed her of the general nature of the procedure including the need for general anesthesia, the incisions to be used, the use of drains to postoperatively, the expected hospital stay, and the overall recovery.  I informed her of the indications,  risks, benefits, and alternatives.  She understands the risks include, but not limited to death, MI, DVT, PE, bleeding, possible need for transfusion, infection, prolonged air leak, cardiac arrhythmias, neuropathic pain, as well as possibility of other unforeseeable complications.  She accepts the risk and agrees to proceed  Thyroid nodule-hypermetabolic will need evaluation with ultrasound once the lung is dealt with.  Plan: Robotic assisted left upper lobectomy on Thursday, 12/12/2021  [name], MD [facility] [phone]

## 2021-12-05 NOTE — H&P (View-Only) (Signed)
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  [name] [name] is a 71 year old woman with a history of tobacco abuse, fibromyalgia, hyperlipidemia, mild COPD, endometriosis, arthritis, post herpetic neuralgia, and depression.  She has smoked about a pack a day for 55 years.  She recently had cut back to 1/2 pack/day.  She has not smoked in the past 2 days.  Recently she injured her right shoulder.  She had a x-ray which showed a question of a right upper lobe nodule.  A CT of the chest was done.  There was no right upper lobe nodule, but there was a left upper lobe nodule.  She was referred to Dr. Patsey Berthold.  A PET/CT showed the nodule was hypermetabolic.  There was a questionable node along the pulmonary artery.  No evidence of mediastinal or distant disease.  She underwent robotic bronchoscopy which showed non-small cell carcinoma.  Aspiration of mediastinal and hilar nodes was negative.  She has been feeling well.  She does have a productive cough which is chronic.  She says she was told she had wheezing on exam but has not had any symptomatic wheezing and has not had to use her bronchodilators.  She has some chronic issues with arthritis but no new bone or joint pain.  No headaches or visual changes or change in appetite or weight loss.  She denies chest pain, pressure, or tightness with exertion.  She is fairly active and still works part-time.  Zubrod Score: At the time of surgery this patient's most appropriate activity status/level should be described as: [x]     0    Normal activity, no symptoms []     1    Restricted in physical strenuous activity but ambulatory, able to do out light work []     2    Ambulatory and capable of self care, unable  to do work activities, up and about >50 % of waking hours                              []     3    Only limited self care, in bed greater than 50% of waking hours []     4    Completely disabled, no self care, confined to bed or chair []     5    Moribund  Past Medical History:  Diagnosis Date   Arthritis    OA   Carpal tunnel syndrome    Depression    Endometriosis    Fibromyalgia    Hyperlipidemia    Left upper lobe pulmonary nodule 11/2021   Post herpetic neuralgia    Sleep disorder    Tobacco abuse     Past Surgical History:  Procedure Laterality Date   ABDOMINAL HYSTERECTOMY  1992   total endometriosis   APPENDECTOMY  1992   bladder tack  1992   CATARACT EXTRACTION W/PHACO Right 03/25/2018   Procedure: CATARACT EXTRACTION PHACO AND INTRAOCULAR LENS PLACEMENT ([place]) RIGHT;  Surgeon: Marchia Meiers, MD;  Location: [facility] ORS;  Service: Ophthalmology;  Laterality: Right;  [place]  01:11 CDE 8.02 Fluid pack lot # [id number]   CATARACT EXTRACTION W/PHACO Left 04/22/2018   Procedure:  CATARACT EXTRACTION PHACO AND INTRAOCULAR LENS PLACEMENT ([place]) Left Eye;  Surgeon: Marchia Meiers, MD;  Location: [facility] ORS;  Service: Ophthalmology;  Laterality: Left;  [place]  01:02 CDE 8.11 Fluid pack lot # [id number]   COLONOSCOPY     FOOT SURGERY Right 04/2016   [facility] in [place] great toe; straightening others    Family History  Problem Relation Age of Onset   Heart disease Mother        MI   Heart disease Father        MI   Arthritis Sister        crippling arthritis   Arthritis Brother        crippling arthritis    Social History Social History   Tobacco Use   Smoking status: Every Day    Packs/day: 1.00    Years: 55.00    Total pack years: 55.00    Types: Cigarettes   Smokeless tobacco: Never   Tobacco comments:    0.5PPD 11/26/2021  Vaping Use   Vaping Use: Never used  Substance Use Topics   Alcohol use: No    Alcohol/week: 0.0 standard drinks of alcohol   Drug use: No     Current Outpatient Medications  Medication Sig Dispense Refill   aspirin 81 MG tablet Take 81 mg by mouth daily.       atorvastatin (LIPITOR) 10 MG tablet TAKE 1 TABLET BY MOUTH EVERY DAY 90 tablet 3   busPIRone (BUSPAR) 15 MG tablet TAKE 1/2 TABLET BY MOUTH TWICE A DAY 90 tablet 2   DULoxetine (CYMBALTA) 60 MG capsule TAKE 1 CAPSULE BY MOUTH EVERY DAY 90 capsule 2   Fluticasone-Umeclidin-Vilant (TRELEGY ELLIPTA) 100-62.5-25 MCG/ACT AEPB Inhale 1 puff into the lungs daily. 28 each 11   Fluticasone-Umeclidin-Vilant (TRELEGY ELLIPTA) 100-62.5-25 MCG/ACT AEPB Inhale 1 puff into the lungs daily. 14 each 0   Melatonin 10 MG CAPS Take 20 mg by mouth at bedtime.     naproxen sodium (ALEVE) 220 MG tablet Take 440 mg by mouth daily as needed (headaches).     pregabalin (LYRICA) 75 MG capsule TAKE 1 CAPSULE BY MOUTH TWICE A DAY 180 capsule 1   traMADol (ULTRAM) 50 MG tablet TAKE 1 TABLET BY MOUTH EVERY 8 HOURS AS NEEDED. NOT COVERED BY INSURANCE. 90 tablet 3   No current facility-administered medications for this visit.    No Known Allergies  Review of Systems  Constitutional:  Negative for activity change, fatigue and unexpected weight change.  HENT:  Negative for trouble swallowing and voice change.   Respiratory:  Positive for cough and wheezing. Negative for chest tightness and shortness of breath.   Cardiovascular:  Negative for chest pain, palpitations and leg swelling.  Genitourinary:  Negative for difficulty urinating and dysuria.  Musculoskeletal:  Positive for arthralgias and joint swelling.  Neurological:  Negative for seizures, syncope and weakness.  Hematological:  Negative for adenopathy. Does not bruise/bleed easily.    BP (!) 171/80 (BP Location: Left Arm, Patient Position: Sitting, Cuff Size: Normal)   Pulse 62   Resp 20   Ht 5\' 4"  (1.626 m)   Wt 172 lb 12.8 oz (78.4 kg)   SpO2 93%   BMI 29.66 kg/m  Physical Exam Vitals reviewed.  Constitutional:      General: She  is not in acute distress.    Appearance: Normal appearance.  HENT:     Head: Normocephalic and atraumatic.  Eyes:     General: No scleral icterus.  Extraocular Movements: Extraocular movements intact.  Neck:     Vascular: No carotid bruit.  Cardiovascular:     Rate and Rhythm: Normal rate and regular rhythm.     Heart sounds: Murmur (2/6 systolic murmur) heard.     No gallop.  Pulmonary:     Effort: Pulmonary effort is normal. No respiratory distress.     Breath sounds: Normal breath sounds. No wheezing or rales.  Abdominal:     General: There is no distension.     Palpations: Abdomen is soft.  Musculoskeletal:     Cervical back: Neck supple.  Lymphadenopathy:     Cervical: No cervical adenopathy.  Skin:    General: Skin is warm and dry.  Neurological:     General: No focal deficit present.     Mental Status: She is alert and oriented to person, place, and time.     Cranial Nerves: No cranial nerve deficit.     Motor: No weakness.    Diagnostic Tests: NUCLEAR MEDICINE PET SKULL BASE TO THIGH   TECHNIQUE: 9.03 mCi F-18 FDG was injected intravenously. Full-ring PET imaging was performed from the skull base to thigh after the radiotracer. CT data was obtained and used for attenuation correction and anatomic localization.   Fasting blood glucose: 97 mg/dl   COMPARISON:  Chest CT November 11, 2021   FINDINGS: Mediastinal blood pool activity: SUV max 1.6   Liver activity: SUV max NA   NECK: Hypermetabolic left thyroid nodule demonstrates a max SUV of 5.5.   No hypermetabolic cervical adenopathy.   Incidental CT findings: None.   CHEST: Hypermetabolic spiculated lesion in the apical left upper lobe contains nodular components and collectively better seen on same day chest CT measuring 2.5 x 1.2 cm on that examination with a max SUV of 13.8.   No additional hypermetabolic pulmonary nodules or masses.   Mildly metabolic nodularity along the left hilum on  axial fused image 86 with a max SUV of 2.9 without adenopathy may reflect local early nodal disease involvement.   Incidental CT findings: Aortic atherosclerosis.  Emphysema.   ABDOMEN/PELVIS: No abnormal hypermetabolic activity within the liver, pancreas, adrenal glands, or spleen.   No hypermetabolic lymph nodes in the abdomen or pelvis.   Incidental CT findings: Bilateral adrenal glands appear normal. Large volume of formed stool throughout the colon. Aortic atherosclerosis.   SKELETON: No focal hypermetabolic activity to suggest skeletal metastasis.   Incidental CT findings: Multilevel degenerative changes spine with multifocal degenerative joint disease.   IMPRESSION: 1. Hypermetabolic spiculated nodular left upper lobe pulmonary lesion is compatible with primary bronchogenic neoplasm. 2. Mildly metabolic nodularity in the left hilum, without discrete adenopathy may reflect early nodal disease involvement. Suggest attention on follow-up imaging. 3. No evidence of distant hypermetabolic metastatic disease. 4. Hypermetabolic nodule in the left lobe of the thyroid, Recommend thyroid US and biopsy (ref: J Am Coll Radiol. 2015 Feb;12(2): 143-50). 5. Aortic Atherosclerosis (ICD10-I70.0) and Emphysema (ICD10-J43.9).     Electronically Signed   By: Dahlia Bailiff M.D.   On: 11/12/2021 11:03 CT CHEST WITH CONTRAST   TECHNIQUE: Multidetector CT imaging of the chest was performed during intravenous contrast administration.   RADIATION DOSE REDUCTION: This exam was performed according to the departmental dose-optimization program which includes automated exposure control, adjustment of the mA and/or kV according to patient size and/or use of iterative reconstruction technique.   CONTRAST:  127mL ISOVUE-300 IOPAMIDOL (ISOVUE-300) INJECTION 61%   COMPARISON:  Chest radiographs dated 09/09/2021.  FINDINGS: Cardiovascular: Atheromatous calcifications, including the  coronary arteries and aorta. Dilated left atrium and left ventricle.   Mediastinum/Nodes: Small amount of superior recess pericardial fluid. No enlarged lymph nodes. Unremarkable esophagus and thyroid gland.   Lungs/Pleura: Superior left upper lobe nodule measuring 10 x 9 mm in maximum dimensions on image number 29/3. This corresponds to the nodule seen radiographically. This is at the inferior aspect of an area of pleural and parenchymal scarring.   No other lung nodules seen. No airspace consolidation or pleural fluid. Minimal linear atelectasis or scarring in the posterior left lower lobe.   Upper Abdomen: Areas of left renal cortical scarring. No adrenal or liver masses seen.   Musculoskeletal: Thoracic and lower cervical spine degenerative changes. No evidence of bony metastatic disease.   IMPRESSION: 1. 10 mm mean diameter nodule in the left upper lobe adjacent to an area of pleural and parenchymal scarring. Consider one of the following in 3 months for both low-risk and high-risk individuals: (a) repeat chest CT, (b) follow-up PET-CT, or (c) tissue sampling. This recommendation follows the consensus statement: Guidelines for Management of Incidental Pulmonary Nodules Detected on CT Images: From the Fleischner Society 2017; Radiology 2017; 284:228-243. 2.  Calcific coronary artery and aortic atherosclerosis. 3. Left atrial and left ventricular dilatation.   Aortic Atherosclerosis (ICD10-I70.0).     Electronically Signed   By: Claudie Revering M.D.   On: 09/17/2021 14:52  I personally reviewed the CT and PET/CT images.  There is a left upper lobe nodule or complex of nodules apically.  There is a 10 mm nodule that is markedly hypermetabolic and there are surrounding areas that have some activity as well.  There is some metabolic activity noted around the pulmonary artery that is not pathologically enlarged.  Probably reactive but possibly represents lymph node metastasis.   Coronary and aortic atherosclerosis.  Pulmonary function testing 11/25/2021 FVC 2.54 (86%) FEV1 1.95 (88%), no significant change with bronchodilators DLCO 14.23 (73%)  Impression: [name] [name] is a 71 year old woman with a history of tobacco abuse, fibromyalgia, hyperlipidemia, mild COPD, endometriosis, arthritis, post herpetic neuralgia, and depression.  She recently hurt her right shoulder which led to an x-ray.  There is a question of a right apical nodule.  CT showed there was no right apical nodule but there was one on the left.  The apical nodule was hypermetabolic on PET/CT.  Biopsy showed non-small cell carcinoma favoring squamous cell.  Mediastinal and hilar lymph node aspirations were negative.  Non-small cell carcinoma left upper lobe clinical stage Ia (T1, N0) is a little difficult to tell the actual size of the nodule because there is a complex shadow in the area.  This could be a 1 cm T1 a lesion or could be as large as 2.5 cm, either way it is a T1 lesion and is surgically resectable.  There are some question with some activity in the node along the pulmonary artery.  That most likely is reactive.  We will assess that node intraoperatively.  Based on the absence of pathologic adenopathy on the scan I would still stage that is N0 clinically.  We discussed treatment options including surgical resection and stereotactic radiation.  She understands these are equivalent better but potential options.  She favors surgical resection for the higher likelihood of a cure.  She does have adequate pulmonary reserve to tolerate resection.  I described the proposed operative procedure to her.  We will plan to do a robotic assisted left upper lobectomy.  I informed her of the general nature of the procedure including the need for general anesthesia, the incisions to be used, the use of drains to postoperatively, the expected hospital stay, and the overall recovery.  I informed her of the indications,  risks, benefits, and alternatives.  She understands the risks include, but not limited to death, MI, DVT, PE, bleeding, possible need for transfusion, infection, prolonged air leak, cardiac arrhythmias, neuropathic pain, as well as possibility of other unforeseeable complications.  She accepts the risk and agrees to proceed  Thyroid nodule-hypermetabolic will need evaluation with ultrasound once the lung is dealt with.  Plan: Robotic assisted left upper lobectomy on Thursday, 12/12/2021  [name], MD [facility] [phone]

## 2021-12-06 ENCOUNTER — Encounter: Payer: Self-pay | Admitting: *Deleted

## 2021-12-09 NOTE — Pre-Procedure Instructions (Signed)
Surgical Instructions    Your procedure is scheduled on Thursday, October 12th.  Report to [facility] Main Entrance "A" at 10:00 A.M., then check in with the Admitting office.  Call this number if you have problems the morning of surgery:  [phone]   If you have any questions prior to your surgery date call [phone]: Open Monday-Friday 8am-4pm    Remember:  Do not eat or drink after midnight the night before your surgery     Take these medicines the morning of surgery with A SIP OF WATER  busPIRone (BUSPAR) DULoxetine (CYMBALTA)  pregabalin (LYRICA)   If needed: Fluticasone-Umeclidin-Vilant (TRELEGY ELLIPTA) traMADol (ULTRAM)    As of today, STOP taking any Aleve, Naproxen, Ibuprofen, Motrin, Advil, Goody's, BC's, all herbal medications, fish oil, and all vitamins.   Do not take Aspirin on day of surgery.                   Do NOT Smoke (Tobacco/Vaping) for 24 hours prior to your procedure.  If you use a CPAP at night, you may bring your mask/headgear for your overnight stay.   Contacts, glasses, piercing's, hearing aid's, dentures or partials may not be worn into surgery, please bring cases for these belongings.    For patients admitted to the hospital, discharge time will be determined by your treatment team.   Patients discharged the day of surgery will not be allowed to drive home, and someone needs to stay with them for 24 hours.  SURGICAL WAITING ROOM VISITATION Patients having surgery or a procedure may have no more than 2 support people in the waiting area - these visitors may rotate.   Children under the age of 63 must have an adult with them who is not the patient. If the patient needs to stay at the hospital during part of their recovery, the visitor guidelines for inpatient rooms apply. Pre-op nurse will coordinate an appropriate time for 1 support person to accompany patient in pre-op.  This support person may not rotate.   Please refer to the  [facility] website for the visitor guidelines for Inpatients (after your surgery is over and you are in a regular room).    Special instructions:   [place]- Preparing For Surgery  Before surgery, you can play an important role. Because skin is not sterile, your skin needs to be as free of germs as possible. You can reduce the number of germs on your skin by washing with CHG (chlorahexidine gluconate) Soap before surgery.  CHG is an antiseptic cleaner which kills germs and bonds with the skin to continue killing germs even after washing.    Oral Hygiene is also important to reduce your risk of infection.  Remember - BRUSH YOUR TEETH THE MORNING OF SURGERY WITH YOUR REGULAR TOOTHPASTE  Please do not use if you have an allergy to CHG or antibacterial soaps. If your skin becomes reddened/irritated stop using the CHG.  Do not shave (including legs and underarms) for at least 48 hours prior to first CHG shower. It is OK to shave your face.  Please follow these instructions carefully.   Shower the NIGHT BEFORE SURGERY and the MORNING OF SURGERY  If you chose to wash your hair, wash your hair first as usual with your normal shampoo.  After you shampoo, rinse your hair and body thoroughly to remove the shampoo.  Use CHG Soap as you would any other liquid soap. You can apply CHG directly to the skin and wash  gently with a scrungie or a clean washcloth.   Apply the CHG Soap to your body ONLY FROM THE NECK DOWN.  Do not use on open wounds or open sores. Avoid contact with your eyes, ears, mouth and genitals (private parts). Wash Face and genitals (private parts)  with your normal soap.   Wash thoroughly, paying special attention to the area where your surgery will be performed.  Thoroughly rinse your body with warm water from the neck down.  DO NOT shower/wash with your normal soap after using and rinsing off the CHG Soap.  Pat yourself dry with a CLEAN TOWEL.  Wear CLEAN PAJAMAS to bed the  night before surgery  Place CLEAN SHEETS on your bed the night before your surgery  DO NOT SLEEP WITH PETS.   Day of Surgery: Take a shower with CHG soap. Do not wear jewelry or makeup Do not wear lotions, powders, perfumes, or deodorant. Do not shave 48 hours prior to surgery.   Do not bring valuables to the hospital. [facility] is not responsible for any belongings or valuables. Do not wear nail polish, gel polish, artificial nails, or any other type of covering on natural nails (fingers and toes) If you have artificial nails or gel coating that need to be removed by a nail salon, please have this removed prior to surgery. Artificial nails or gel coating may interfere with anesthesia's ability to adequately monitor your vital signs. Wear Clean/Comfortable clothing the morning of surgery Remember to brush your teeth WITH YOUR REGULAR TOOTHPASTE.   Please read over the following fact sheets that you were given.    If you received a COVID test during your pre-op visit  it is requested that you wear a mask when out in public, stay away from anyone that may not be feeling well and notify your surgeon if you develop symptoms. If you have been in contact with anyone that has tested positive in the last 10 days please notify you surgeon.

## 2021-12-10 ENCOUNTER — Encounter (HOSPITAL_COMMUNITY): Payer: Self-pay

## 2021-12-10 ENCOUNTER — Other Ambulatory Visit: Payer: Self-pay

## 2021-12-10 ENCOUNTER — Ambulatory Visit (HOSPITAL_COMMUNITY)
Admission: RE | Admit: 2021-12-10 | Discharge: 2021-12-10 | Disposition: A | Discharge status: Home / Self Care | Payer: Medicare Other | Source: Ambulatory Visit | Attending: Thoracic Surgery (Cardiothoracic Vascular Surgery) | Admitting: Thoracic Surgery (Cardiothoracic Vascular Surgery)

## 2021-12-10 ENCOUNTER — Encounter (HOSPITAL_COMMUNITY)
Admission: RE | Admit: 2021-12-10 | Discharge: 2021-12-10 | Disposition: A | Discharge status: Home / Self Care | Payer: Medicare Other | Source: Ambulatory Visit | Attending: Thoracic Surgery (Cardiothoracic Vascular Surgery) | Admitting: Thoracic Surgery (Cardiothoracic Vascular Surgery)

## 2021-12-10 VITALS — BP 136/78 | HR 58 | Temp 98.4°F | Resp 17 | Ht 64.0 in | Wt 172.3 lb

## 2021-12-10 DIAGNOSIS — R9431 Abnormal electrocardiogram [ECG] [EKG]: Secondary | ICD-10-CM | POA: Insufficient documentation

## 2021-12-10 DIAGNOSIS — J90 Pleural effusion, not elsewhere classified: Secondary | ICD-10-CM | POA: Diagnosis not present

## 2021-12-10 DIAGNOSIS — T17990A Other foreign object in respiratory tract, part unspecified in causing asphyxiation, initial encounter: Secondary | ICD-10-CM | POA: Diagnosis not present

## 2021-12-10 DIAGNOSIS — J449 Chronic obstructive pulmonary disease, unspecified: Secondary | ICD-10-CM | POA: Diagnosis not present

## 2021-12-10 DIAGNOSIS — C771 Secondary and unspecified malignant neoplasm of intrathoracic lymph nodes: Secondary | ICD-10-CM | POA: Diagnosis not present

## 2021-12-10 DIAGNOSIS — C3490 Malignant neoplasm of unspecified part of unspecified bronchus or lung: Secondary | ICD-10-CM | POA: Diagnosis not present

## 2021-12-10 DIAGNOSIS — D500 Iron deficiency anemia secondary to blood loss (chronic): Secondary | ICD-10-CM | POA: Diagnosis not present

## 2021-12-10 DIAGNOSIS — Z01818 Encounter for other preprocedural examination: Secondary | ICD-10-CM | POA: Insufficient documentation

## 2021-12-10 DIAGNOSIS — Z1152 Encounter for screening for COVID-19: Secondary | ICD-10-CM | POA: Insufficient documentation

## 2021-12-10 DIAGNOSIS — Z7982 Long term (current) use of aspirin: Secondary | ICD-10-CM | POA: Diagnosis not present

## 2021-12-10 DIAGNOSIS — M797 Fibromyalgia: Secondary | ICD-10-CM | POA: Diagnosis not present

## 2021-12-10 DIAGNOSIS — E785 Hyperlipidemia, unspecified: Secondary | ICD-10-CM | POA: Diagnosis not present

## 2021-12-10 DIAGNOSIS — Z79899 Other long term (current) drug therapy: Secondary | ICD-10-CM | POA: Diagnosis not present

## 2021-12-10 DIAGNOSIS — R001 Bradycardia, unspecified: Secondary | ICD-10-CM | POA: Insufficient documentation

## 2021-12-10 DIAGNOSIS — Z902 Acquired absence of lung [part of]: Secondary | ICD-10-CM | POA: Diagnosis not present

## 2021-12-10 DIAGNOSIS — J948 Other specified pleural conditions: Secondary | ICD-10-CM | POA: Diagnosis not present

## 2021-12-10 DIAGNOSIS — G709 Myoneural disorder, unspecified: Secondary | ICD-10-CM | POA: Diagnosis not present

## 2021-12-10 DIAGNOSIS — J9811 Atelectasis: Secondary | ICD-10-CM | POA: Diagnosis not present

## 2021-12-10 DIAGNOSIS — J984 Other disorders of lung: Secondary | ICD-10-CM | POA: Diagnosis not present

## 2021-12-10 DIAGNOSIS — M1990 Unspecified osteoarthritis, unspecified site: Secondary | ICD-10-CM | POA: Diagnosis not present

## 2021-12-10 DIAGNOSIS — C3412 Malignant neoplasm of upper lobe, left bronchus or lung: Secondary | ICD-10-CM | POA: Insufficient documentation

## 2021-12-10 DIAGNOSIS — D649 Anemia, unspecified: Secondary | ICD-10-CM | POA: Diagnosis not present

## 2021-12-10 DIAGNOSIS — F17210 Nicotine dependence, cigarettes, uncomplicated: Secondary | ICD-10-CM | POA: Diagnosis not present

## 2021-12-10 DIAGNOSIS — I700 Atherosclerosis of aorta: Secondary | ICD-10-CM | POA: Diagnosis not present

## 2021-12-10 DIAGNOSIS — J939 Pneumothorax, unspecified: Secondary | ICD-10-CM | POA: Diagnosis not present

## 2021-12-10 DIAGNOSIS — Z87891 Personal history of nicotine dependence: Secondary | ICD-10-CM | POA: Diagnosis not present

## 2021-12-10 DIAGNOSIS — I4519 Other right bundle-branch block: Secondary | ICD-10-CM | POA: Insufficient documentation

## 2021-12-10 DIAGNOSIS — Z7951 Long term (current) use of inhaled steroids: Secondary | ICD-10-CM | POA: Diagnosis not present

## 2021-12-10 DIAGNOSIS — E041 Nontoxic single thyroid nodule: Secondary | ICD-10-CM | POA: Diagnosis not present

## 2021-12-10 DIAGNOSIS — F32A Depression, unspecified: Secondary | ICD-10-CM | POA: Diagnosis not present

## 2021-12-10 DIAGNOSIS — K5900 Constipation, unspecified: Secondary | ICD-10-CM | POA: Diagnosis not present

## 2021-12-10 LAB — CBC
HCT: 38.8 % (ref 36.0–46.0)
Hemoglobin: 13.3 g/dL (ref 12.0–15.0)
MCH: 30.9 pg (ref 26.0–34.0)
MCHC: 34.3 g/dL (ref 30.0–36.0)
MCV: 90.2 fL (ref 80.0–100.0)
Platelets: 227 10*3/uL (ref 150–400)
RBC: 4.3 MIL/uL (ref 3.87–5.11)
RDW: 13.9 % (ref 11.5–15.5)
WBC: 5.6 10*3/uL (ref 4.0–10.5)
nRBC: 0 % (ref 0.0–0.2)

## 2021-12-10 LAB — URINALYSIS, ROUTINE W REFLEX MICROSCOPIC
Bilirubin Urine: NEGATIVE
Glucose, UA: NEGATIVE mg/dL
Ketones, ur: NEGATIVE mg/dL
Nitrite: POSITIVE — AB
Protein, ur: NEGATIVE mg/dL
Specific Gravity, Urine: 1.005 (ref 1.005–1.030)
WBC, UA: >50 WBC/hpf — ABNORMAL HIGH (ref 0–5)
pH: 6 (ref 5.0–8.0)

## 2021-12-10 LAB — COMPREHENSIVE METABOLIC PANEL
ALT: 14 U/L (ref 0–44)
AST: 18 U/L (ref 15–41)
Albumin: 3.6 g/dL (ref 3.5–5.0)
Alkaline Phosphatase: 89 U/L (ref 38–126)
Anion gap: 5 (ref 5–15)
BUN: 10 mg/dL (ref 8–23)
CO2: 24 mmol/L (ref 22–32)
Calcium: 9.3 mg/dL (ref 8.9–10.3)
Chloride: 106 mmol/L (ref 98–111)
Creatinine, Ser: 0.68 mg/dL (ref 0.44–1.00)
GFR, Estimated: >60 mL/min (ref >60)
Glucose, Bld: 78 mg/dL (ref 70–99)
Potassium: 4.3 mmol/L (ref 3.5–5.1)
Sodium: 135 mmol/L (ref 135–145)
Total Bilirubin: 0.9 mg/dL (ref 0.3–1.2)
Total Protein: 6.5 g/dL (ref 6.5–8.1)

## 2021-12-10 LAB — BLOOD GAS, ARTERIAL
Acid-Base Excess: 2.5 mmol/L — ABNORMAL HIGH (ref 0.0–2.0)
Bicarbonate: 26.3 mmol/L (ref 20.0–28.0)
Drawn by: 58793
O2 Saturation: 96.7 %
Patient temperature: 37
pCO2 arterial: 37 mmHg (ref 32–48)
pH, Arterial: 7.46 — ABNORMAL HIGH (ref 7.35–7.45)
pO2, Arterial: 75 mmHg — ABNORMAL LOW (ref 83–108)

## 2021-12-10 LAB — SURGICAL PCR SCREEN
MRSA, PCR: NEGATIVE
Staphylococcus aureus: POSITIVE — AB

## 2021-12-10 LAB — PROTIME-INR
INR: 1 (ref 0.8–1.2)
Prothrombin Time: 13.1 seconds (ref 11.4–15.2)

## 2021-12-10 LAB — APTT: aPTT: 26 seconds (ref 24–36)

## 2021-12-10 NOTE — Progress Notes (Signed)
Message sent to Levonne Spiller, RN regarding abnormal UA

## 2021-12-10 NOTE — Progress Notes (Signed)
PCP - Dr. Loura Pardon Cardiologist - denies  PPM/ICD - denies   Chest x-ray - 12/10/21 EKG - 12/10/21 Stress Test - denies ECHO - denies Cardiac Cath - denies  Sleep Study - denies  DM- denies  Last dose of GLP1 agonist-  n/a   Blood Thinner Instructions: n/a Aspirin Instructions: Hold DOS  ERAS Protcol - no, NPO   COVID TEST- 12/10/21   Anesthesia review: no  Patient denies shortness of breath, fever, cough and chest pain at PAT appointment   All instructions explained to the patient, with a verbal understanding of the material. Patient agrees to go over the instructions while at home for a better understanding. Patient also instructed to wear a mask in public after being tested for COVID-19. The opportunity to ask questions was provided.

## 2021-12-11 LAB — SARS CORONAVIRUS 2 (TAT 6-24 HRS): SARS Coronavirus 2: NEGATIVE

## 2021-12-12 ENCOUNTER — Other Ambulatory Visit: Payer: Self-pay

## 2021-12-12 ENCOUNTER — Encounter (HOSPITAL_COMMUNITY): Payer: Self-pay | Admitting: Thoracic Surgery (Cardiothoracic Vascular Surgery)

## 2021-12-12 ENCOUNTER — Inpatient Hospital Stay (HOSPITAL_COMMUNITY): Payer: Medicare Other | Admitting: Certified Registered Nurse Anesthetist

## 2021-12-12 ENCOUNTER — Encounter (HOSPITAL_COMMUNITY)
Admission: RE | Disposition: A | Discharge status: Home / Self Care | Payer: Self-pay | Source: Home / Self Care | Attending: Thoracic Surgery (Cardiothoracic Vascular Surgery)

## 2021-12-12 ENCOUNTER — Inpatient Hospital Stay (HOSPITAL_COMMUNITY)
Admission: RE | Admit: 2021-12-12 | Discharge: 2021-12-21 | DRG: 164 | Disposition: A | Discharge status: Home / Self Care | Payer: Medicare Other | Attending: Thoracic Surgery (Cardiothoracic Vascular Surgery) | Admitting: Thoracic Surgery (Cardiothoracic Vascular Surgery)

## 2021-12-12 ENCOUNTER — Inpatient Hospital Stay (HOSPITAL_COMMUNITY): Payer: Medicare Other

## 2021-12-12 DIAGNOSIS — F32A Depression, unspecified: Secondary | ICD-10-CM | POA: Diagnosis present

## 2021-12-12 DIAGNOSIS — Z7982 Long term (current) use of aspirin: Secondary | ICD-10-CM | POA: Diagnosis not present

## 2021-12-12 DIAGNOSIS — G709 Myoneural disorder, unspecified: Secondary | ICD-10-CM

## 2021-12-12 DIAGNOSIS — J948 Other specified pleural conditions: Secondary | ICD-10-CM | POA: Diagnosis not present

## 2021-12-12 DIAGNOSIS — C3412 Malignant neoplasm of upper lobe, left bronchus or lung: Secondary | ICD-10-CM

## 2021-12-12 DIAGNOSIS — Z79899 Other long term (current) drug therapy: Secondary | ICD-10-CM | POA: Diagnosis not present

## 2021-12-12 DIAGNOSIS — M1990 Unspecified osteoarthritis, unspecified site: Secondary | ICD-10-CM | POA: Diagnosis not present

## 2021-12-12 DIAGNOSIS — D500 Iron deficiency anemia secondary to blood loss (chronic): Secondary | ICD-10-CM | POA: Diagnosis present

## 2021-12-12 DIAGNOSIS — E785 Hyperlipidemia, unspecified: Secondary | ICD-10-CM | POA: Diagnosis present

## 2021-12-12 DIAGNOSIS — Z1152 Encounter for screening for COVID-19: Secondary | ICD-10-CM

## 2021-12-12 DIAGNOSIS — C771 Secondary and unspecified malignant neoplasm of intrathoracic lymph nodes: Secondary | ICD-10-CM | POA: Diagnosis not present

## 2021-12-12 DIAGNOSIS — Z87891 Personal history of nicotine dependence: Secondary | ICD-10-CM | POA: Diagnosis not present

## 2021-12-12 DIAGNOSIS — Z902 Acquired absence of lung [part of]: Principal | ICD-10-CM

## 2021-12-12 DIAGNOSIS — E041 Nontoxic single thyroid nodule: Secondary | ICD-10-CM | POA: Diagnosis not present

## 2021-12-12 DIAGNOSIS — J939 Pneumothorax, unspecified: Secondary | ICD-10-CM | POA: Diagnosis not present

## 2021-12-12 DIAGNOSIS — K5900 Constipation, unspecified: Secondary | ICD-10-CM | POA: Diagnosis not present

## 2021-12-12 DIAGNOSIS — J90 Pleural effusion, not elsewhere classified: Secondary | ICD-10-CM | POA: Diagnosis not present

## 2021-12-12 DIAGNOSIS — J449 Chronic obstructive pulmonary disease, unspecified: Secondary | ICD-10-CM | POA: Diagnosis not present

## 2021-12-12 DIAGNOSIS — M797 Fibromyalgia: Secondary | ICD-10-CM | POA: Diagnosis present

## 2021-12-12 DIAGNOSIS — Z7951 Long term (current) use of inhaled steroids: Secondary | ICD-10-CM

## 2021-12-12 DIAGNOSIS — J9811 Atelectasis: Secondary | ICD-10-CM | POA: Diagnosis not present

## 2021-12-12 DIAGNOSIS — F17210 Nicotine dependence, cigarettes, uncomplicated: Secondary | ICD-10-CM | POA: Diagnosis present

## 2021-12-12 DIAGNOSIS — D649 Anemia, unspecified: Secondary | ICD-10-CM | POA: Diagnosis not present

## 2021-12-12 HISTORY — PX: LYMPH NODE DISSECTION: SHX5087

## 2021-12-12 HISTORY — PX: INTERCOSTAL NERVE BLOCK: SHX5021

## 2021-12-12 LAB — PREPARE RBC (CROSSMATCH)

## 2021-12-12 LAB — ABO/RH: ABO/RH(D): AB POS

## 2021-12-12 SURGERY — LOBECTOMY, LUNG, ROBOT-ASSISTED, USING VATS
Anesthesia: General | Site: Chest | Laterality: Left

## 2021-12-12 MED ORDER — HYDROMORPHONE HCL 1 MG/ML IJ SOLN
INTRAMUSCULAR | Status: AC
  Filled 2021-12-12: qty 0.5

## 2021-12-12 MED ORDER — BUPIVACAINE LIPOSOME 1.3 % IJ SUSP
INTRAMUSCULAR | Status: AC
  Filled 2021-12-12: qty 20

## 2021-12-12 MED ORDER — PHENYLEPHRINE HCL-NACL 20-0.9 MG/250ML-% IV SOLN
INTRAVENOUS | Status: DC | PRN
  Administered 2021-12-12: 50 ug/min via INTRAVENOUS

## 2021-12-12 MED ORDER — FLUTICASONE FUROATE-VILANTEROL 100-25 MCG/ACT IN AEPB: 1.0000 | INHALATION_SPRAY | Freq: Every day | RESPIRATORY_TRACT | Status: DC

## 2021-12-12 MED ORDER — FENTANYL CITRATE (PF) 250 MCG/5ML IJ SOLN
INTRAMUSCULAR | Status: AC
  Filled 2021-12-12: qty 5

## 2021-12-12 MED ORDER — PHENYLEPHRINE 80 MCG/ML (10ML) SYRINGE FOR IV PUSH (FOR BLOOD PRESSURE SUPPORT)
PREFILLED_SYRINGE | INTRAVENOUS | Status: AC
  Filled 2021-12-12: qty 10

## 2021-12-12 MED ORDER — SODIUM CHLORIDE 0.9% IV SOLUTION: Freq: Once | INTRAVENOUS | Status: DC

## 2021-12-12 MED ORDER — CEFAZOLIN SODIUM-DEXTROSE 2-4 GM/100ML-% IV SOLN
2.0000 g | INTRAVENOUS | Status: AC
  Administered 2021-12-12: 2 g via INTRAVENOUS
  Filled 2021-12-12: qty 100

## 2021-12-12 MED ORDER — CHLORHEXIDINE GLUCONATE 0.12 % MT SOLN
15.0000 mL | Freq: Once | OROMUCOSAL | Status: AC
  Administered 2021-12-12: 15 mL via OROMUCOSAL
  Filled 2021-12-12: qty 15

## 2021-12-12 MED ORDER — FENTANYL CITRATE (PF) 250 MCG/5ML IJ SOLN
INTRAMUSCULAR | Status: DC | PRN
  Administered 2021-12-12: 100 ug via INTRAVENOUS
  Administered 2021-12-12: 150 ug via INTRAVENOUS

## 2021-12-12 MED ORDER — ALBUMIN HUMAN 5 % IV SOLN: INTRAVENOUS | Status: DC | PRN

## 2021-12-12 MED ORDER — LABETALOL HCL 5 MG/ML IV SOLN
INTRAVENOUS | Status: AC
  Filled 2021-12-12: qty 4

## 2021-12-12 MED ORDER — EPHEDRINE 5 MG/ML INJ
INTRAVENOUS | Status: AC
  Filled 2021-12-12: qty 10

## 2021-12-12 MED ORDER — ROCURONIUM BROMIDE 10 MG/ML (PF) SYRINGE
PREFILLED_SYRINGE | INTRAVENOUS | Status: DC | PRN
  Administered 2021-12-12: 20 mg via INTRAVENOUS
  Administered 2021-12-12: 40 mg via INTRAVENOUS
  Administered 2021-12-12: 60 mg via INTRAVENOUS
  Administered 2021-12-12: 15 mg via INTRAVENOUS

## 2021-12-12 MED ORDER — PROPOFOL 10 MG/ML IV BOLUS
INTRAVENOUS | Status: AC
  Filled 2021-12-12: qty 20

## 2021-12-12 MED ORDER — FLUTICASONE FUROATE-VILANTEROL 100-25 MCG/ACT IN AEPB
1.0000 | INHALATION_SPRAY | Freq: Every day | RESPIRATORY_TRACT | Status: DC
  Administered 2021-12-13 – 2021-12-21 (×8): 1 via RESPIRATORY_TRACT
  Filled 2021-12-12: qty 28

## 2021-12-12 MED ORDER — 0.9 % SODIUM CHLORIDE (POUR BTL) OPTIME
TOPICAL | Status: DC | PRN
  Administered 2021-12-12: 2000 mL

## 2021-12-12 MED ORDER — LIDOCAINE 2% (20 MG/ML) 5 ML SYRINGE
INTRAMUSCULAR | Status: AC
  Filled 2021-12-12: qty 10

## 2021-12-12 MED ORDER — EPHEDRINE SULFATE (PRESSORS) 50 MG/ML IJ SOLN
INTRAMUSCULAR | Status: DC | PRN
  Administered 2021-12-12: 5 mg via INTRAVENOUS

## 2021-12-12 MED ORDER — ROCURONIUM BROMIDE 10 MG/ML (PF) SYRINGE
PREFILLED_SYRINGE | INTRAVENOUS | Status: AC
  Filled 2021-12-12: qty 30

## 2021-12-12 MED ORDER — KETOROLAC TROMETHAMINE 15 MG/ML IJ SOLN
15.0000 mg | Freq: Four times a day (QID) | INTRAMUSCULAR | Status: DC
  Administered 2021-12-12 – 2021-12-13 (×2): 15 mg via INTRAVENOUS
  Filled 2021-12-12 (×2): qty 1

## 2021-12-12 MED ORDER — ENOXAPARIN SODIUM 40 MG/0.4ML IJ SOSY
40.0000 mg | PREFILLED_SYRINGE | Freq: Every day | INTRAMUSCULAR | Status: DC
  Administered 2021-12-13 – 2021-12-20 (×6): 40 mg via SUBCUTANEOUS
  Filled 2021-12-12 (×8): qty 0.4

## 2021-12-12 MED ORDER — PROPOFOL 10 MG/ML IV BOLUS
INTRAVENOUS | Status: DC | PRN
  Administered 2021-12-12: 150 mg via INTRAVENOUS
  Administered 2021-12-12: 50 mg via INTRAVENOUS

## 2021-12-12 MED ORDER — LABETALOL HCL 5 MG/ML IV SOLN
INTRAVENOUS | Status: DC | PRN
  Administered 2021-12-12: 5 mg via INTRAVENOUS

## 2021-12-12 MED ORDER — CEFAZOLIN SODIUM-DEXTROSE 2-4 GM/100ML-% IV SOLN
2.0000 g | Freq: Three times a day (TID) | INTRAVENOUS | Status: AC
  Administered 2021-12-12 – 2021-12-13 (×2): 2 g via INTRAVENOUS
  Filled 2021-12-12 (×2): qty 100

## 2021-12-12 MED ORDER — SENNOSIDES-DOCUSATE SODIUM 8.6-50 MG PO TABS
1.0000 | ORAL_TABLET | Freq: Every day | ORAL | Status: DC
  Administered 2021-12-12 – 2021-12-20 (×9): 1 via ORAL
  Filled 2021-12-12 (×9): qty 1

## 2021-12-12 MED ORDER — LIDOCAINE 2% (20 MG/ML) 5 ML SYRINGE
INTRAMUSCULAR | Status: DC | PRN
  Administered 2021-12-12: 40 mg via INTRAVENOUS

## 2021-12-12 MED ORDER — SODIUM CHLORIDE 0.45 % IV SOLN: INTRAVENOUS | Status: DC

## 2021-12-12 MED ORDER — PANTOPRAZOLE SODIUM 40 MG PO TBEC
40.0000 mg | DELAYED_RELEASE_TABLET | Freq: Every day | ORAL | Status: DC
  Administered 2021-12-13 – 2021-12-20 (×8): 40 mg via ORAL
  Filled 2021-12-12 (×8): qty 1

## 2021-12-12 MED ORDER — DEXAMETHASONE SODIUM PHOSPHATE 10 MG/ML IJ SOLN
INTRAMUSCULAR | Status: AC
  Filled 2021-12-12: qty 2

## 2021-12-12 MED ORDER — ONDANSETRON HCL 4 MG/2ML IJ SOLN
INTRAMUSCULAR | Status: DC | PRN
  Administered 2021-12-12: 4 mg via INTRAVENOUS

## 2021-12-12 MED ORDER — ONDANSETRON HCL 4 MG/2ML IJ SOLN: 4.0000 mg | Freq: Four times a day (QID) | INTRAMUSCULAR | Status: DC | PRN

## 2021-12-12 MED ORDER — SODIUM CHLORIDE 0.9 % IV SOLN: INTRAVENOUS | Status: DC | PRN

## 2021-12-12 MED ORDER — FENTANYL CITRATE PF 50 MCG/ML IJ SOSY
25.0000 ug | PREFILLED_SYRINGE | INTRAMUSCULAR | Status: DC | PRN
  Administered 2021-12-13: 25 ug via INTRAVENOUS
  Filled 2021-12-12: qty 1

## 2021-12-12 MED ORDER — FENTANYL CITRATE (PF) 100 MCG/2ML IJ SOLN
INTRAMUSCULAR | Status: AC
  Filled 2021-12-12: qty 2

## 2021-12-12 MED ORDER — UMECLIDINIUM BROMIDE 62.5 MCG/ACT IN AEPB
1.0000 | INHALATION_SPRAY | Freq: Every day | RESPIRATORY_TRACT | Status: DC
  Administered 2021-12-13 – 2021-12-20 (×7): 1 via RESPIRATORY_TRACT
  Filled 2021-12-12: qty 7

## 2021-12-12 MED ORDER — OXYCODONE HCL 5 MG PO TABS
5.0000 mg | ORAL_TABLET | ORAL | Status: DC | PRN
  Administered 2021-12-12 – 2021-12-14 (×6): 10 mg via ORAL
  Administered 2021-12-14: 5 mg via ORAL
  Administered 2021-12-14 – 2021-12-16 (×2): 10 mg via ORAL
  Administered 2021-12-16: 5 mg via ORAL
  Administered 2021-12-17 – 2021-12-20 (×8): 10 mg via ORAL
  Filled 2021-12-12 (×11): qty 2
  Filled 2021-12-12: qty 1
  Filled 2021-12-12: qty 2
  Filled 2021-12-12: qty 1
  Filled 2021-12-12 (×4): qty 2

## 2021-12-12 MED ORDER — ONDANSETRON HCL 4 MG/2ML IJ SOLN
INTRAMUSCULAR | Status: AC
  Filled 2021-12-12: qty 2

## 2021-12-12 MED ORDER — LACTATED RINGERS IV SOLN: INTRAVENOUS | Status: DC

## 2021-12-12 MED ORDER — UMECLIDINIUM BROMIDE 62.5 MCG/ACT IN AEPB: 1.0000 | INHALATION_SPRAY | Freq: Every day | RESPIRATORY_TRACT | Status: DC

## 2021-12-12 MED ORDER — ASPIRIN 81 MG PO TBEC
81.0000 mg | DELAYED_RELEASE_TABLET | Freq: Every day | ORAL | Status: DC
  Administered 2021-12-13 – 2021-12-20 (×8): 81 mg via ORAL
  Filled 2021-12-12 (×12): qty 1

## 2021-12-12 MED ORDER — OXYCODONE HCL 5 MG PO TABS: 5.0000 mg | ORAL_TABLET | Freq: Once | ORAL | Status: DC | PRN

## 2021-12-12 MED ORDER — OXYCODONE HCL 5 MG/5ML PO SOLN: 5.0000 mg | Freq: Once | ORAL | Status: DC | PRN

## 2021-12-12 MED ORDER — MIDAZOLAM HCL 2 MG/2ML IJ SOLN
INTRAMUSCULAR | Status: AC
  Filled 2021-12-12: qty 2

## 2021-12-12 MED ORDER — LACTATED RINGERS IV SOLN: INTRAVENOUS | Status: DC | PRN

## 2021-12-12 MED ORDER — SODIUM CHLORIDE FLUSH 0.9 % IV SOLN
INTRAVENOUS | Status: DC | PRN
  Administered 2021-12-12: 100 mL

## 2021-12-12 MED ORDER — PREGABALIN 75 MG PO CAPS
75.0000 mg | ORAL_CAPSULE | Freq: Two times a day (BID) | ORAL | Status: DC
  Administered 2021-12-12 – 2021-12-17 (×11): 75 mg via ORAL
  Filled 2021-12-12 (×11): qty 1

## 2021-12-12 MED ORDER — DEXMEDETOMIDINE HCL IN NACL 80 MCG/20ML IV SOLN
INTRAVENOUS | Status: AC
  Filled 2021-12-12: qty 20

## 2021-12-12 MED ORDER — MELATONIN 5 MG PO TABS
20.0000 mg | ORAL_TABLET | Freq: Every day | ORAL | Status: DC
  Administered 2021-12-12 – 2021-12-20 (×9): 20 mg via ORAL
  Filled 2021-12-12 (×9): qty 4

## 2021-12-12 MED ORDER — ORAL CARE MOUTH RINSE: 15.0000 mL | Freq: Once | OROMUCOSAL | Status: AC

## 2021-12-12 MED ORDER — MIDAZOLAM HCL 2 MG/2ML IJ SOLN
INTRAMUSCULAR | Status: DC | PRN
  Administered 2021-12-12 (×2): 1 mg via INTRAVENOUS

## 2021-12-12 MED ORDER — DEXAMETHASONE SODIUM PHOSPHATE 10 MG/ML IJ SOLN
INTRAMUSCULAR | Status: DC | PRN
  Administered 2021-12-12: 10 mg via INTRAVENOUS

## 2021-12-12 MED ORDER — BUPIVACAINE HCL (PF) 0.5 % IJ SOLN
INTRAMUSCULAR | Status: AC
  Filled 2021-12-12: qty 30

## 2021-12-12 MED ORDER — FLUTICASONE FUROATE-VILANTEROL 100-25 MCG/ACT IN AEPB
1.0000 | INHALATION_SPRAY | Freq: Every day | RESPIRATORY_TRACT | Status: DC
  Filled 2021-12-12: qty 28

## 2021-12-12 MED ORDER — FENTANYL CITRATE (PF) 100 MCG/2ML IJ SOLN
25.0000 ug | INTRAMUSCULAR | Status: DC | PRN
  Administered 2021-12-12 (×3): 25 ug via INTRAVENOUS

## 2021-12-12 MED ORDER — UMECLIDINIUM BROMIDE 62.5 MCG/ACT IN AEPB
1.0000 | INHALATION_SPRAY | Freq: Every day | RESPIRATORY_TRACT | Status: DC
  Filled 2021-12-12: qty 7

## 2021-12-12 MED ORDER — HYDROMORPHONE HCL 1 MG/ML IJ SOLN
INTRAMUSCULAR | Status: DC | PRN
  Administered 2021-12-12 (×2): .5 mg via INTRAVENOUS

## 2021-12-12 MED ORDER — BUSPIRONE HCL 15 MG PO TABS
15.0000 mg | ORAL_TABLET | Freq: Every day | ORAL | Status: DC
  Administered 2021-12-12 – 2021-12-20 (×9): 15 mg via ORAL
  Filled 2021-12-12 (×9): qty 1

## 2021-12-12 MED ORDER — BISACODYL 5 MG PO TBEC
10.0000 mg | DELAYED_RELEASE_TABLET | Freq: Every day | ORAL | Status: DC
  Administered 2021-12-12 – 2021-12-20 (×7): 10 mg via ORAL
  Filled 2021-12-12 (×8): qty 2

## 2021-12-12 MED ORDER — DULOXETINE HCL 60 MG PO CPEP
60.0000 mg | ORAL_CAPSULE | Freq: Every day | ORAL | Status: DC
  Administered 2021-12-12 – 2021-12-20 (×9): 60 mg via ORAL
  Filled 2021-12-12 (×9): qty 1

## 2021-12-12 MED ORDER — ACETAMINOPHEN 160 MG/5ML PO SOLN: 1000.0000 mg | Freq: Four times a day (QID) | ORAL | Status: DC

## 2021-12-12 MED ORDER — ACETAMINOPHEN 500 MG PO TABS
1000.0000 mg | ORAL_TABLET | Freq: Four times a day (QID) | ORAL | Status: DC
  Administered 2021-12-12 – 2021-12-13 (×2): 1000 mg via ORAL
  Filled 2021-12-12 (×2): qty 2

## 2021-12-12 MED ORDER — SUGAMMADEX SODIUM 200 MG/2ML IV SOLN
INTRAVENOUS | Status: DC | PRN
  Administered 2021-12-12: 200 mg via INTRAVENOUS

## 2021-12-12 SURGICAL SUPPLY — 81 items
BAG SPEC RTRVL C1550 15 (MISCELLANEOUS) ×1
CANISTER SUCT 3000ML PPV (MISCELLANEOUS) ×2 IMPLANT
CANNULA REDUC XI 12-8 STAPL (CANNULA) ×2
CANNULA REDUCER 12-8 DVNC XI (CANNULA) ×2 IMPLANT
CNTNR URN SCR LID CUP LEK RST (MISCELLANEOUS) ×5 IMPLANT
CONN ST 1/4X3/8  BEN (MISCELLANEOUS) ×1
CONN ST 1/4X3/8 BEN (MISCELLANEOUS) IMPLANT
CONT SPEC 4OZ STRL OR WHT (MISCELLANEOUS) ×14
DEFOGGER SCOPE WARMER CLEARIFY (MISCELLANEOUS) ×1 IMPLANT
DERMABOND ADVANCED .7 DNX12 (GAUZE/BANDAGES/DRESSINGS) ×1 IMPLANT
DRAIN CHANNEL 28F RND 3/8 FF (WOUND CARE) IMPLANT
DRAPE ARM DVNC X/XI (DISPOSABLE) ×4 IMPLANT
DRAPE COLUMN DVNC XI (DISPOSABLE) ×1 IMPLANT
DRAPE CV SPLIT W-CLR ANES SCRN (DRAPES) ×1 IMPLANT
DRAPE DA VINCI XI ARM (DISPOSABLE) ×4
DRAPE DA VINCI XI COLUMN (DISPOSABLE) ×1
DRAPE HALF SHEET 40X57 (DRAPES) ×1 IMPLANT
DRAPE ORTHO SPLIT 77X108 STRL (DRAPES) ×1
DRAPE SURG ORHT 6 SPLT 77X108 (DRAPES) ×1 IMPLANT
ELECT BLADE 6.5 EXT (BLADE) ×1 IMPLANT
ELECT REM PT RETURN 9FT ADLT (ELECTROSURGICAL) ×1
ELECTRODE REM PT RTRN 9FT ADLT (ELECTROSURGICAL) ×1 IMPLANT
GAUZE KITTNER 4X5 RF (MISCELLANEOUS) ×2 IMPLANT
GAUZE SPONGE 4X4 12PLY STRL (GAUZE/BANDAGES/DRESSINGS) ×1 IMPLANT
GLOVE BIOGEL PI IND STRL 7.0 (GLOVE) IMPLANT
GLOVE SS BIOGEL STRL SZ 7.5 (GLOVE) ×1 IMPLANT
GLOVE SURG POLYISO LF SZ8 (GLOVE) ×1 IMPLANT
GOWN STRL REUS W/ TWL LRG LVL3 (GOWN DISPOSABLE) ×2 IMPLANT
GOWN STRL REUS W/ TWL XL LVL3 (GOWN DISPOSABLE) ×2 IMPLANT
GOWN STRL REUS W/TWL 2XL LVL3 (GOWN DISPOSABLE) ×1 IMPLANT
GOWN STRL REUS W/TWL LRG LVL3 (GOWN DISPOSABLE) ×2
GOWN STRL REUS W/TWL XL LVL3 (GOWN DISPOSABLE) ×2
HEMOSTAT SURGICEL 2X14 (HEMOSTASIS) ×3 IMPLANT
IRRIGATION STRYKERFLOW (MISCELLANEOUS) ×1 IMPLANT
IRRIGATOR STRYKERFLOW (MISCELLANEOUS) ×1
KIT BASIN OR (CUSTOM PROCEDURE TRAY) ×1 IMPLANT
KIT TURNOVER KIT B (KITS) ×1 IMPLANT
NDL HYPO 25GX1X1/2 BEV (NEEDLE) ×1 IMPLANT
NDL SPNL 22GX3.5 QUINCKE BK (NEEDLE) ×1 IMPLANT
NEEDLE HYPO 25GX1X1/2 BEV (NEEDLE) ×1 IMPLANT
NEEDLE SPNL 22GX3.5 QUINCKE BK (NEEDLE) ×1 IMPLANT
NS IRRIG 1000ML POUR BTL (IV SOLUTION) ×2 IMPLANT
PACK CHEST (CUSTOM PROCEDURE TRAY) ×1 IMPLANT
PAD ARMBOARD 7.5X6 YLW CONV (MISCELLANEOUS) ×2 IMPLANT
RELOAD STAPLE 45 2.5 WHT DVNC (STAPLE) IMPLANT
RELOAD STAPLE 45 3.5 BLU DVNC (STAPLE) IMPLANT
RELOAD STAPLE 45 4.3 GRN DVNC (STAPLE) IMPLANT
RELOAD STAPLER 2.5X45 WHT DVNC (STAPLE) ×7 IMPLANT
RELOAD STAPLER 3.5X45 BLU DVNC (STAPLE) ×4 IMPLANT
RELOAD STAPLER 4.3X45 GRN DVNC (STAPLE) ×2 IMPLANT
SEAL CANN UNIV 5-8 DVNC XI (MISCELLANEOUS) ×2 IMPLANT
SEAL XI 5MM-8MM UNIVERSAL (MISCELLANEOUS) ×2
SET TRI-LUMEN FLTR TB AIRSEAL (TUBING) ×1 IMPLANT
SOLUTION ELECTROLUBE (MISCELLANEOUS) ×1 IMPLANT
SPONGE INTESTINAL PEANUT (DISPOSABLE) IMPLANT
STAPLER 45 SUREFORM CVD (STAPLE) ×1
STAPLER 45 SUREFORM CVD DVNC (STAPLE) IMPLANT
STAPLER CANNULA SEAL DVNC XI (STAPLE) ×2 IMPLANT
STAPLER CANNULA SEAL XI (STAPLE) ×2
STAPLER RELOAD 2.5X45 WHITE (STAPLE) ×7
STAPLER RELOAD 2.5X45 WHT DVNC (STAPLE) ×7
STAPLER RELOAD 3.5X45 BLU DVNC (STAPLE) ×4
STAPLER RELOAD 3.5X45 BLUE (STAPLE) ×4
STAPLER RELOAD 4.3X45 GREEN (STAPLE) ×2
STAPLER RELOAD 4.3X45 GRN DVNC (STAPLE) ×2
SUT SILK  1 MH (SUTURE) ×1
SUT SILK 1 MH (SUTURE) ×1 IMPLANT
SUT SILK 2 0 SH (SUTURE) ×1 IMPLANT
SUT VIC AB 1 CTX 36 (SUTURE) ×1
SUT VIC AB 1 CTX36XBRD ANBCTR (SUTURE) ×1 IMPLANT
SUT VIC AB 2-0 CTX 36 (SUTURE) ×1 IMPLANT
SUT VIC AB 3-0 X1 27 (SUTURE) ×2 IMPLANT
SUT VICRYL 0 TIES 12 18 (SUTURE) ×1 IMPLANT
SUT VICRYL 0 UR6 27IN ABS (SUTURE) ×2 IMPLANT
SYR 20CC LL (SYRINGE) ×2 IMPLANT
SYSTEM RETRIEVAL ANCHOR 15 (MISCELLANEOUS) IMPLANT
SYSTEM SAHARA CHEST DRAIN ATS (WOUND CARE) ×1 IMPLANT
TAPE CLOTH 4X10 WHT NS (GAUZE/BANDAGES/DRESSINGS) ×1 IMPLANT
TOWEL GREEN STERILE (TOWEL DISPOSABLE) ×1 IMPLANT
TRAY FOLEY MTR SLVR 14FR STAT (SET/KITS/TRAYS/PACK) IMPLANT
WATER STERILE IRR 1000ML POUR (IV SOLUTION) ×2 IMPLANT

## 2021-12-12 NOTE — Interval H&P Note (Signed)
History and Physical Interval Note:  12/12/2021 10:24 AM  [name] [name]  has presented today for surgery, with the diagnosis of LEFT UPPER LOBE NON-SMALL CELL CARCINOMA.  The various methods of treatment have been discussed with the patient and family. After consideration of risks, benefits and other options for treatment, the patient has consented to  Procedure(s): XI ROBOTIC ASSISTED THORACOSCOPY-LEFT UPPER LOBECTOMY (Left) as a surgical intervention.  The patient's history has been reviewed, patient examined, no change in status, stable for surgery.  I have reviewed the patient's chart and labs.  Questions were answered to the patient's satisfaction.     [name]

## 2021-12-12 NOTE — Anesthesia Procedure Notes (Signed)
Procedure Name: Intubation Date/Time: 12/12/2021 11:12 AM  Performed by: Inda Coke, CRNAPre-anesthesia Checklist: Patient identified, Emergency Drugs available, Suction available, Timeout performed and Patient being monitored Patient Re-evaluated:Patient Re-evaluated prior to induction Oxygen Delivery Method: Circle system utilized Preoxygenation: Pre-oxygenation with 100% oxygen Induction Type: IV induction Ventilation: Mask ventilation without difficulty Laryngoscope Size: Mac and 3 Grade View: Grade I Tube type: Oral Endobronchial tube: Left, Double lumen EBT, EBT position confirmed by fiberoptic bronchoscope and EBT position confirmed by auscultation and 35 Fr Number of attempts: 1 Airway Equipment and Method: Stylet Placement Confirmation: ETT inserted through vocal cords under direct vision, positive ETCO2, CO2 detector and breath sounds checked- equal and bilateral Secured at: 27 cm Tube secured with: Tape Dental Injury: Teeth and Oropharynx as per pre-operative assessment

## 2021-12-12 NOTE — Anesthesia Preprocedure Evaluation (Signed)
Anesthesia Evaluation  Patient identified by MRN, date of birth, ID band Patient awake    Reviewed: Allergy & Precautions, H&P , NPO status , Patient's Chart, lab work & pertinent test results  Airway Mallampati: II   Neck ROM: full    Dental   Pulmonary former smoker,  LUL mass   breath sounds clear to auscultation       Cardiovascular negative cardio ROS   Rhythm:regular Rate:Normal     Neuro/Psych PSYCHIATRIC DISORDERS Depression  Neuromuscular disease    GI/Hepatic   Endo/Other    Renal/GU      Musculoskeletal  (+) Arthritis , Fibromyalgia -  Abdominal   Peds  Hematology   Anesthesia Other Findings   Reproductive/Obstetrics                             Anesthesia Physical Anesthesia Plan  ASA: 3  Anesthesia Plan: General   Post-op Pain Management:    Induction: Intravenous  PONV Risk Score and Plan: 3 and Ondansetron, Dexamethasone, Midazolam and Treatment may vary due to age or medical condition  Airway Management Planned: Double Lumen EBT  Additional Equipment: Arterial line  Intra-op Plan:   Post-operative Plan: Extubation in OR  Informed Consent: I have reviewed the patients History and Physical, chart, labs and discussed the procedure including the risks, benefits and alternatives for the proposed anesthesia with the patient or authorized representative who has indicated his/her understanding and acceptance.     Dental advisory given  Plan Discussed with: CRNA, Anesthesiologist and Surgeon  Anesthesia Plan Comments:         Anesthesia Quick Evaluation

## 2021-12-12 NOTE — Op Note (Signed)
NAME: [name], [name] MEDICAL RECORD NO: [id number] ACCOUNT NO: [id number] DATE OF BIRTH: December 21, 1950 FACILITY: [facility] LOCATION: [facility] PHYSICIAN: Revonda Standard. Roxan Hockey, MD  Operative Report   DATE OF PROCEDURE: 12/12/2021  PREOPERATIVE DIAGNOSIS:  Non-small cell carcinoma, left upper lobe, clinical stage IA.  POSTOPERATIVE DIAGNOSIS:  Non-small cell carcinoma, left upper lobe, clinical stage IA.  PROCEDURE:   Xi robotic-assisted left upper lobectomy,  Lymph node dissection and  Intercostal nerve blocks levels 3 through 10.  SURGEON:  Revonda Standard. Roxan Hockey, MD  ASSISTANT:  Ellwood Handler, PA  ANESTHESIA:  General.  FINDINGS:  Multiple enlarged nodes.  Frozen section on a suspicious node was negative for tumor. Unusual arterial and venous anatomy.  Bronchial margin negative for tumor.  CLINICAL NOTE:  [name] [name] is a 71 year old woman with a history of tobacco use, who was found to have a left apical lung nodule.  Biopsy showed non-small cell carcinoma.  Endobronchial ultrasound of mediastinal and hilar lymph nodes were negative for  tumor.  She was referred for surgical resection.  The indications, risks, benefits, and alternatives were discussed in detail with the patient.  She understood and accepted the risks and agreed to proceed.  OPERATIVE NOTE:  [name] was brought to the preoperative holding area on 12/12/2021.  She had intravenous access and an arterial blood pressure monitoring line placed by anesthesia.  She was taken to the operating room, anesthetized, and intubated with  a double lumen endotracheal tube.  Intravenous antibiotics were administered.  A Foley catheter was placed.  Sequential compression devices were placed on the calves for DVT prophylaxis.  She was placed in a right lateral decubitus position.  A Bair  Hugger was placed for active warming.  The left chest was prepped and draped in the usual sterile fashion.  Single lung ventilation of the right lung was  initiated and was tolerated well throughout the procedure.  A timeout was performed.  A solution containing 20 mL of liposomal bupivacaine, 30 mL of 0.5% bupivacaine and 50 mL of saline was prepared.  This solution was used for local at the incision sites as well as for the intercostal nerve blocks.  An incision was made  in the eighth interspace in approximately the midaxillary line, an 8 mm robotic port was inserted.  The thoracoscope was advanced into the chest.  After confirming intrapleural placement, carbon dioxide was insufflated per protocol.  Intercostal nerve  blocks were performed from the third to the tenth interspace.  A needle was passed from posterior and 10 mL of the bupivacaine solution was injected into a subpleural plane at each level from the third to the tenth interspace.  A 12 mm AirSeal port was  placed in the tenth interspace slightly more anteriorly to the camera port and a 12 mm robotic port was placed in the eighth interspace anterior to the camera port.  Two additional eighth interspace robotic ports were placed, one was 12 mm and the other  was 8 mm.  The robot was deployed.  The camera arm was docked, targeting was performed.  The remaining arms were docked.  Robotic instruments were inserted with thoracoscopic visualization.  The lower lobe was retracted superiorly and the inferior ligament was divided with bipolar cautery.  A level 9 lymph node was removed.  All lymph nodes that were removed were sent as separate specimens for permanent pathology. The patient had numerous  nodes and many of them were enlarged.  One suspicious node was sent for frozen  section as later described.  The pleural reflection was divided at the hilum posteriorly and a level 7 nodes were removed.  The dissection along the pulmonary artery was begun  at this point.  Working more superiorly, a 4L node was removed and then a very small level 5 node was removed and then a much larger level 5 node was  removed.  There were adhesions of the upper lobe to the anterior mediastinum.  These were thin filmy  adhesions that were taken down with bipolar cautery.  The pleural reflection then was divided at the hilum anteriorly.  The superior and inferior pulmonary veins combined and then entered the left atrium as a single common trunk.  A level 10 node in  this area that was dissected out.  Attention then was turned to the fissure.  The fissure was nearly complete posteriorly and only a very small area posteriorly had to be completed with a stapler.  Working anteriorly, the fissure was very incomplete and  the arterial anatomy was unusual. The lingular branches arose relatively far apart with a large node between them.  The division of the fissure was begun using the robotic stapler using blue and green cartridges. As the hilum was approached, there was a  large level 11 node that was identified.  This was worrisome in appearance initially and it was dissected out and removed. The center of the node appeared relatively normal.  The node was sent for frozen section, which returned with no tumor seen.   There were multiple other enlarged level 10 and 11 nodes, but all appeared grossly benign.  The superior pulmonary vein was dissected out.  The anterior, apical and posterior branches were encircled and were divided with the robotic stapler. As the dissection progressed, it was clear that the lingular branches were separated and those were encircled and divided with the robotic stapler.  The fissure then was completed, which allowed dissection of the node from around the lingular arterial branches.   Those arterial branches were then encircled and divided with the robotic stapler.  There were enlarged level 12 nodes as well.  The posterior pulmonary artery branch was dissected out and divided with the stapler and then the remaining branches were divided as well.  The  stapler was placed across the left upper lobe  bronchus at its origin.  A test inflation showed good aeration of the lower lobe.  The stapler was fired transecting the left upper lobe bronchus.  The vessel loop and sponges that had been placed during the  dissection were removed.  The chest was copiously irrigated with saline.  A test inflation to 30 cm of water revealed no leakage from the bronchial stump.  The specimen was placed into a 15 mm endoscopic retrieval bag.  The robotic instruments were  removed.  The robot was undocked.  The anterior eighth interspace incision was lengthened to 3 cm and then the left upper lobe was removed in the retrieval bag through the anterior incision and sent for frozen section of the bronchial margin which  subsequently returned with no tumor seen.  The scope was reinserted.  A final inspection was made for hemostasis.  A 28-French Blake drain was placed through the original port incision and secured with a #1 silk suture.  Dual lung ventilation was  resumed.  The remaining incisions were closed in standard fashion.  Dermabond was applied after the skin closure.  Chest tube was placed to a Pleur-Evac on waterseal.  She was placed back in a supine position and extubated in the operating room and taken  to the postanesthetic care unit in good condition.  All sponge, needle and instrument counts were correct at the end of the procedure.  Experienced assistance was necessary for this case due to surgical complexity.  Erin Barrett assisted with port placement, robot docking and undocking, instrument exchange, specimen retrieval, suctioning, and wound closure.   NIK D: 12/12/2021 5:37:17 pm T: 12/12/2021 10:01:00 pm  JOB: [id number]/ [id number]

## 2021-12-12 NOTE — Anesthesia Postprocedure Evaluation (Signed)
Anesthesia Post Note  Patient: TELETHA PETREA  Procedure(s) Performed: XI ROBOTIC ASSISTED THORACOSCOPY-LEFT UPPER LOBECTOMY (Left: Chest) LYMPH NODE DISSECTION (Left: Chest) INTERCOSTAL NERVE BLOCK (Left: Chest)     Patient location during evaluation: PACU Anesthesia Type: General Level of consciousness: awake and alert Pain management: pain level controlled Vital Signs Assessment: post-procedure vital signs reviewed and stable Respiratory status: spontaneous breathing, nonlabored ventilation, respiratory function stable and patient connected to nasal cannula oxygen Cardiovascular status: blood pressure returned to baseline and stable Postop Assessment: no apparent nausea or vomiting Anesthetic complications: no   No notable events documented.  Last Vitals:  Vitals:   12/12/21 1645 12/12/21 1700  BP: 122/61 125/61  Pulse: 66 66  Resp: 13 16  Temp:    SpO2: 92% 92%    Last Pain:  Vitals:   12/12/21 1645  PainSc: [place]

## 2021-12-12 NOTE — Transfer of Care (Signed)
Immediate Anesthesia Transfer of Care Note  Patient: [name] [name]  Procedure(s) Performed: XI ROBOTIC ASSISTED THORACOSCOPY-LEFT UPPER LOBECTOMY (Left: Chest) LYMPH NODE DISSECTION (Left: Chest) INTERCOSTAL NERVE BLOCK (Left: Chest)  Patient Location: PACU  Anesthesia Type:General  Level of Consciousness: awake and alert   Airway & Oxygen Therapy: Patient Spontanous Breathing and Patient connected to face mask oxygen  Post-op Assessment: Report given to RN and Post -op Vital signs reviewed and stable  Post vital signs: Reviewed and stable  Last Vitals:  Vitals Value Taken Time  BP 122/70 12/12/21 1445  Temp    Pulse 75 12/12/21 1445  Resp 20 12/12/21 1445  SpO2 99 % 12/12/21 1445  Vitals shown include unvalidated device data.  Last Pain:  Vitals:   12/12/21 1027  PainSc: 0-No pain      Patients Stated Pain Goal: 0 (33/82/50 5397)  Complications: No notable events documented.

## 2021-12-12 NOTE — Hospital Course (Addendum)
History of Present Illness:  HPI: [name] [name] is sent for consultation regarding a non-small cell carcinoma of the left upper lobe.   [name] [name] is a 71 year old woman with a history of tobacco abuse, fibromyalgia, hyperlipidemia, mild COPD, endometriosis, arthritis, post herpetic neuralgia, and depression.  She has smoked about a pack a day for 55 years.  She recently had cut back to 1/2 pack/day.  She has not smoked in the past 2 days.  Recently she injured her right shoulder.  She had a x-ray which showed a question of a right upper lobe nodule.  A CT of the chest was done.  There was no right upper lobe nodule, but there was a left upper lobe nodule.  She was referred to Dr. Patsey Berthold.  A PET/CT showed the nodule was hypermetabolic.  There was a questionable node along the pulmonary artery.  No evidence of mediastinal or distant disease.  She underwent robotic bronchoscopy which showed non-small cell carcinoma.  Aspiration of mediastinal and hilar nodes was negative.   She has been feeling well.  She does have a productive cough which is chronic.  She says she was told she had wheezing on exam but has not had any symptomatic wheezing and has not had to use her bronchodilators.  She has some chronic issues with arthritis but no new bone or joint pain.  No headaches or visual changes or change in appetite or weight loss.  She denies chest pain, pressure, or tightness with exertion.  She is fairly active and still works part-time.  It was felt should would benefit from surgical resection.  The risks and benefits of the procedure were explained to the patient and she was agreeable to proceed.  Hospital Course:  [name] [name] presented to [facility] on 12/12/2021.  She was taken to the operating room and underwent Robotic Assisted Left Video Assisted Thoracoscopy with Left Upper Lobe Lobectomy, Lymph Node Dissection, and Intercostal Nerve Block.  He tolerated the procedure without difficulty,  was extubated, and taken to PACU in stable condition.  The patient's CXR showed atelectasis.  She was instructed on flutter valve use to help improve this. She did not have an air leak and chest tube output was minimal.  Her chest tube was removed on 12/13/2021.  Follow up CXR showed a left pneumothorax.  Follow up CXR showed some pneumothorax. She also had a large pleural effusion. She underwent Thoracentesis with removal of 400 cc fluid and air.  Follow up CXR showed large hydropneumothorax.  She continued to require several liters of oxygen via [place].  CT of the chest was obtained and showed asymmetric elevation of the left hemidiaphragm associated with collapse/consolidation of left lung and moderate to large left sided hydropneumothorax.   Her chest x ray on 10/16 showed persistent atelectasis and large left hydropneumothorax, despite NT suctioning, Mucomyst, incentive spirometer and flutter valve. Dr. Roxan Hockey discussed the need for bronchoscopy and this was done 10/16.  Daily chest x rays remained stable (dense consolidation of LLL (as expected) and partially fluid filled space. Her home medications have been restarted as needed. She is ambulating on oxygen and will require oxygen at discharge.  Because of complaints of increased pain, Lyrica was increased to 150 mg bid and she was given several more doses of Toradol. Her surgical incisions are healing without evidence of infection.Daily chest x rays were taken and improvement was seen. She is medically stable for discharge home today.

## 2021-12-12 NOTE — Brief Op Note (Addendum)
12/12/2021  2:28 PM  PATIENT:  [name] [name]  71 y.o. female  PRE-OPERATIVE DIAGNOSIS:  LEFT UPPER LOBE NON-SMALL CELL CARCINOMA- CLINICAL STAGE IA(T1N0)  POST-OPERATIVE DIAGNOSIS:  LEFT UPPER LOBE NON-SMALL CELL CARCINOMA- CLINICAL STAGE IA(T1N0)  PROCEDURE:  Procedure(s):  XI ROBOTIC ASSISTED THORACOSCOPY- LEFT UPPER LOBECTOMY (Left) LYMPH NODE DISSECTION (Left) INTERCOSTAL NERVE BLOCK (Left)  SURGEON:  Surgeon(s) and Role:    * [name], MD - Primary  PHYSICIAN ASSISTANT: Ellwood Handler PA-C  ASSISTANTS: none   ANESTHESIA:   general  EBL:  50 mL   BLOOD ADMINISTERED: None  DRAINS:  28 Blake Drain    LOCAL MEDICATIONS USED:  [place]  SPECIMEN:  Source of Specimen:  Lymph Nodes, Left Upper Lobe  DISPOSITION OF SPECIMEN:  PATHOLOGY  COUNTS:  YES  TOURNIQUET:  * No tourniquets in log *  DICTATION: .done  PLAN OF CARE: Admit to inpatient   PATIENT DISPOSITION:  PACU - hemodynamically stable.   Delay start of Pharmacological VTE agent (>24hrs) due to surgical blood loss or risk of bleeding: no

## 2021-12-13 ENCOUNTER — Encounter (HOSPITAL_COMMUNITY): Payer: Self-pay | Admitting: Thoracic Surgery (Cardiothoracic Vascular Surgery)

## 2021-12-13 ENCOUNTER — Inpatient Hospital Stay (HOSPITAL_COMMUNITY): Payer: Medicare Other

## 2021-12-13 LAB — TYPE AND SCREEN
ABO/RH(D): AB POS
Antibody Screen: NEGATIVE
Unit division: 0
Unit division: 0

## 2021-12-13 LAB — BPAM RBC
Blood Product Expiration Date: 202311192359
Blood Product Expiration Date: 202311192359
ISSUE DATE / TIME: 202310121137
ISSUE DATE / TIME: 202310121137
Unit Type and Rh: 8400
Unit Type and Rh: 8400

## 2021-12-13 LAB — CBC
HCT: 33 % — ABNORMAL LOW (ref 36.0–46.0)
Hemoglobin: 11.1 g/dL — ABNORMAL LOW (ref 12.0–15.0)
MCH: 30.7 pg (ref 26.0–34.0)
MCHC: 33.6 g/dL (ref 30.0–36.0)
MCV: 91.2 fL (ref 80.0–100.0)
Platelets: 184 10*3/uL (ref 150–400)
RBC: 3.62 MIL/uL — ABNORMAL LOW (ref 3.87–5.11)
RDW: 13.8 % (ref 11.5–15.5)
WBC: 9.3 10*3/uL (ref 4.0–10.5)
nRBC: 0 % (ref 0.0–0.2)

## 2021-12-13 LAB — BASIC METABOLIC PANEL
Anion gap: 5 (ref 5–15)
BUN: 9 mg/dL (ref 8–23)
CO2: 25 mmol/L (ref 22–32)
Calcium: 8.7 mg/dL — ABNORMAL LOW (ref 8.9–10.3)
Chloride: 105 mmol/L (ref 98–111)
Creatinine, Ser: 0.74 mg/dL (ref 0.44–1.00)
GFR, Estimated: >60 mL/min (ref >60)
Glucose, Bld: 158 mg/dL — ABNORMAL HIGH (ref 70–99)
Potassium: 4.1 mmol/L (ref 3.5–5.1)
Sodium: 135 mmol/L (ref 135–145)

## 2021-12-13 MED ORDER — KETOROLAC TROMETHAMINE 15 MG/ML IJ SOLN
15.0000 mg | Freq: Four times a day (QID) | INTRAMUSCULAR | Status: AC
  Administered 2021-12-13 – 2021-12-14 (×5): 15 mg via INTRAVENOUS
  Filled 2021-12-13 (×5): qty 1

## 2021-12-13 MED ORDER — GUAIFENESIN ER 600 MG PO TB12
1200.0000 mg | ORAL_TABLET | Freq: Two times a day (BID) | ORAL | Status: AC
  Administered 2021-12-13 – 2021-12-17 (×10): 1200 mg via ORAL
  Filled 2021-12-13 (×10): qty 2

## 2021-12-13 MED ORDER — ACETAMINOPHEN 160 MG/5ML PO SOLN
1000.0000 mg | Freq: Four times a day (QID) | ORAL | Status: AC
  Filled 2021-12-13 (×3): qty 40.6

## 2021-12-13 MED ORDER — ACETAMINOPHEN 500 MG PO TABS
1000.0000 mg | ORAL_TABLET | Freq: Four times a day (QID) | ORAL | Status: AC
  Administered 2021-12-13 – 2021-12-17 (×14): 1000 mg via ORAL
  Filled 2021-12-13 (×14): qty 2

## 2021-12-13 NOTE — Progress Notes (Signed)
1 Day Post-Op Procedure(s) (LRB): XI ROBOTIC ASSISTED THORACOSCOPY-LEFT UPPER LOBECTOMY (Left) LYMPH NODE DISSECTION (Left) INTERCOSTAL NERVE BLOCK (Left) Subjective: Some discomfort, wants Foley out  Objective: Vital signs in last 24 hours: Temp:  [97.1 F (36.2 C)-98.5 F (36.9 C)] 98.5 F (36.9 C) (10/13 0300) Pulse Rate:  [55-81] 56 (10/13 0700) Cardiac Rhythm: Normal sinus rhythm (10/13 0734) Resp:  [13-24] 21 (10/13 0700) BP: (117-174)/(52-110) 156/65 (10/13 0700) SpO2:  [90 %-99 %] 94 % (10/13 0700) Arterial Line BP: (157-175)/(70-107) 168/107 (10/12 1500) Weight:  [78 kg] 78 kg (10/12 1015)  Hemodynamic parameters for last 24 hours:    Intake/Output from previous day: 10/12 0701 - 10/13 0700 In: 2762.5 [I.V.:2212.5; IV Piggyback:550] Out: 730 [Urine:590; Blood:50; Chest Tube:90] Intake/Output this shift: Total I/O In: -  Out: 20 [Chest Tube:20]  General appearance: alert, cooperative, and no distress Neurologic: intact Heart: regular rate and rhythm Lungs: diminished breath sounds on left No air leak, serosanguinous CT output  Lab Results: Recent Labs    12/10/21 1450 12/13/21 0102  WBC 5.6 9.3  HGB 13.3 11.1*  HCT 38.8 33.0*  PLT 227 184   BMET:  Recent Labs    12/10/21 1450 12/13/21 0102  NA 135 135  K 4.3 4.1  CL 106 105  CO2 24 25  GLUCOSE 78 158*  BUN 10 9  CREATININE 0.68 0.74  CALCIUM 9.3 8.7*    PT/INR:  Recent Labs    12/10/21 1450  LABPROT 13.1  INR 1.0   ABG    Component Value Date/Time   PHART 7.46 (H) 12/10/2021 1435   HCO3 26.3 12/10/2021 1435   O2SAT 96.7 12/10/2021 1435   CBG (last 3)  No results for input(s): "GLUCAP" in the last 72 hours.  Assessment/Plan: S/P Procedure(s) (LRB): XI ROBOTIC ASSISTED THORACOSCOPY-LEFT UPPER LOBECTOMY (Left) LYMPH NODE DISSECTION (Left) INTERCOSTAL NERVE BLOCK (Left) POD # 1 Overall looks good but CXR shows significant atelectasis on left Will add mucinex and flutter valve,  continue IS SCD + enoxaparin for DVT prophylaxis Ambulate    LOS: 1 day    [name] 12/13/2021

## 2021-12-13 NOTE — Progress Notes (Signed)
Mobility Specialist Progress Note    12/13/21 1225  Mobility  Activity Ambulated with assistance in hallway  Level of Assistance Contact guard assist, steadying assist  Assistive Device Front wheel walker  Distance Ambulated (ft) 350 ft  Activity Response Tolerated well  Mobility Referral Yes  $Mobility charge 1 Mobility   Pre-Mobility: 68 HR, 93% SpO2 Post-Mobility: 71 HR, 92% SpO2  Pt received in bed and agreeable. Desaturated as low as 82% on RA. Required 4LO2 to maintain SpO2 88-90%. Encouraged pursed lip breathing. No complaints. Left in BR and encouraged to pull string for assistance. RN and NT aware.   Hildred Alamin Mobility Specialist  Secure Chat Only

## 2021-12-13 NOTE — Discharge Summary (Addendum)
Physician Discharge Summary  Patient ID: [name] [name] MRN: [id number] DOB/AGE: Jan 21, 1951 71 y.o.  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 History of the following:        OA   Carpal tunnel syndrome     Depression     Endometriosis     Fibromyalgia     Hyperlipidemia     Left upper lobe pulmonary nodule 11/2021   Post herpetic neuralgia     Sleep disorder     Tobacco abuse     Discharged Condition: stable  History of Present Illness:  HPI: [name] [name] is sent for consultation regarding a non-small cell carcinoma of the left upper lobe.   [name] [name] is a 71 year old woman with a history of tobacco abuse, fibromyalgia, hyperlipidemia, mild COPD, endometriosis, arthritis, post herpetic neuralgia, and depression.  She has smoked about a pack a day for 55 years.  She recently had cut back to 1/2 pack/day.  She has not smoked in the past 2 days.  Recently she injured her right shoulder.  She had a x-ray which showed a question of a right upper lobe nodule.  A CT of the chest was done.  There was no right upper lobe nodule, but there was a left upper lobe nodule.  She was referred to Dr. Patsey Berthold.  A PET/CT showed the nodule was hypermetabolic.  There was a questionable node along the pulmonary artery.  No evidence of mediastinal or distant disease.  She underwent robotic bronchoscopy which showed non-small cell carcinoma.  Aspiration of mediastinal and hilar nodes was negative.   She has been feeling well.  She does have a productive cough which is chronic.  She says she was told she had wheezing on exam but has not had any symptomatic wheezing and has not had to use her bronchodilators.  She has  some chronic issues with arthritis but no new bone or joint pain.  No headaches or visual changes or change in appetite or weight loss.  She denies chest pain, pressure, or tightness with exertion.  She is fairly active and still works part-time.  It was felt should would benefit from surgical resection.  The risks and benefits of the procedure were explained to the patient and she was agreeable to proceed.  Hospital Course:  [name] [name] presented to [facility] on 12/12/2021.  She was taken to the operating room and underwent Robotic Assisted Left Video Assisted Thoracoscopy with Left Upper Lobe Lobectomy, Lymph Node Dissection, and Intercostal Nerve Block.  He tolerated the procedure without difficulty, was extubated, and taken to PACU in stable condition.  The patient's CXR showed atelectasis.  She was instructed on flutter valve use to help improve this. She did not have an air leak and chest tube output was minimal.  Her chest tube was removed on 12/13/2021.  Follow up CXR showed a left pneumothorax.  Follow up CXR showed some pneumothorax. She also had a large pleural effusion. She underwent Thoracentesis with removal of 400 cc fluid and air.  Follow up CXR showed large hydropneumothorax.  She continued to require several liters of oxygen via [place].  CT of the chest was obtained and showed asymmetric elevation of the left hemidiaphragm associated with collapse/consolidation of  left lung and moderate to large left sided hydropneumothorax.   Her chest x ray on 10/16 showed persistent atelectasis and large left hydropneumothorax, despite NT suctioning, Mucomyst, incentive spirometer and flutter valve. Dr. Roxan Hockey discussed the need for bronchoscopy and this was done 10/16.  Daily chest x rays remained stable (dense consolidation of LLL (as expected) and partially fluid filled space. Her home medications have been restarted as needed. She is ambulating on oxygen and will require oxygen at  discharge.  Because of complaints of increased pain, Lyrica was increased to 150 mg bid and she was given several more doses of Toradol. Her surgical incisions are healing without evidence of infection.Daily chest x rays were taken and improvement was seen. She is medically stable for discharge home today.  Consults:  IR  Significant Diagnostic Studies: nuclear medicine:    Narrative & Impression  CLINICAL DATA:  Initial treatment strategy for pulmonary nodule.   EXAM: NUCLEAR MEDICINE PET SKULL BASE TO THIGH   TECHNIQUE: 9.03 mCi F-18 FDG was injected intravenously. Full-ring PET imaging was performed from the skull base to thigh after the radiotracer. CT data was obtained and used for attenuation correction and anatomic localization.   Fasting blood glucose: 97 mg/dl   COMPARISON:  Chest CT November 11, 2021   FINDINGS: Mediastinal blood pool activity: SUV max 1.6   Liver activity: SUV max NA   NECK: Hypermetabolic left thyroid nodule demonstrates a max SUV of 5.5.   No hypermetabolic cervical adenopathy.   Incidental CT findings: None.   CHEST: Hypermetabolic spiculated lesion in the apical left upper lobe contains nodular components and collectively better seen on same day chest CT measuring 2.5 x 1.2 cm on that examination with a max SUV of 13.8.   No additional hypermetabolic pulmonary nodules or masses.   Mildly metabolic nodularity along the left hilum on axial fused image 86 with a max SUV of 2.9 without adenopathy may reflect local early nodal disease involvement.   Incidental CT findings: Aortic atherosclerosis.  Emphysema.   ABDOMEN/PELVIS: No abnormal hypermetabolic activity within the liver, pancreas, adrenal glands, or spleen.   No hypermetabolic lymph nodes in the abdomen or pelvis.   Incidental CT findings: Bilateral adrenal glands appear normal. Large volume of formed stool throughout the colon. Aortic atherosclerosis.   SKELETON: No focal  hypermetabolic activity to suggest skeletal metastasis.   Incidental CT findings: Multilevel degenerative changes spine with multifocal degenerative joint disease.   IMPRESSION: 1. Hypermetabolic spiculated nodular left upper lobe pulmonary lesion is compatible with primary bronchogenic neoplasm. 2. Mildly metabolic nodularity in the left hilum, without discrete adenopathy may reflect early nodal disease involvement. Suggest attention on follow-up imaging. 3. No evidence of distant hypermetabolic metastatic disease. 4. Hypermetabolic nodule in the left lobe of the thyroid, Recommend thyroid US and biopsy (ref: J Am Coll Radiol. 2015 Feb;12(2): 143-50). 5. Aortic Atherosclerosis (ICD10-I70.0) and Emphysema (ICD10-J43.9).     Electronically Signed   By: Dahlia Bailiff M.D.   On: 11/12/2021 11:03    Treatments: 1. surgery:   DATE OF PROCEDURE: 12/12/2021   PREOPERATIVE DIAGNOSIS:  Non-small cell carcinoma, left upper lobe, clinical stage IA.   POSTOPERATIVE DIAGNOSIS:  Non-small cell carcinoma, left upper lobe, clinical stage IA.   PROCEDURE:  Xi robotic-assisted left upper lobectomy, lymph node dissection and intercostal nerve blocks levels 3 through 10.   SURGEON:  Revonda Standard. Roxan Hockey, MD   ASSISTANT:  Ellwood Handler, PA-C   2. Video bronchoscopy by Dr. Roxan Hockey on 12/16/2021.  PATHOLOGY:   FINAL MICROSCOPIC DIAGNOSIS:   A. LYMPH NODE, LEVEL 11, EXCISION:  - Lymph node, negative for carcinoma (0/1)   B. LUNG, LEFT UPPER LOBE, LOBECTOMY:  - Invasive moderately differentiated squamous cell carcinoma, 2.6 cm  - Visceral pleura is not involved  - Bronchovascular margin is negative for carcinoma  - Metastatic carcinoma to one of three hilar lymph nodes (1/3)  - See oncology table   C. LYMPH NODE, LEVEL 9, EXCISION:  - Lymph node, negative for carcinoma (0/1)   D. LYMPH NODE, LEVEL 7, EXCISION:  - Lymph node, negative for carcinoma (0/1)   E. LYMPH NODE, LEVEL  7 #2, EXCISION:  - Lymph node, negative for carcinoma (0/1)   F. LYMPH NODE, LEVEL 10, EXCISION:  - Lymph node, negative for carcinoma (0/1)   G. LYMPH NODE, LEVEL 7 #3, EXCISION:  - Lymph node, negative for carcinoma (0/1)   H. LYMPH NODE, LEVEL 12, EXCISION:  - Lymph node, negative for carcinoma (0/1)   I. LYMPH NODE, LEVEL 4L, EXCISION:  - Lymph node, negative for carcinoma (0/1)   J. LYMPH NODE, LEVEL 5, EXCISION:  - Scant fibroadipose tissue, negative for carcinoma  - Lymphoid tissue is not identified   K. LYMPH NODE, LEVEL 5 #2, EXCISION:  - Lymph node, negative for carcinoma (0/1)   L. LYMPH NODE, LEVEL 12 #2, EXCISION:  - Lymph node, negative for carcinoma (0/1)   M. LYMPH NODE, LEVEL 12 #3, EXCISION:  - Lymph node, negative for carcinoma (0/1)   N. LYMPH NODE, LEVEL 11 #2, EXCISION:  - Lymph node, negative for carcinoma (0/1)   O. LYMPH NODE, LEVEL 11 #3, EXCISION:  - Lymph node, negative for carcinoma (0/1)   P. LYMPH NODE, LEVEL 11 #4, EXCISION:  - Lymph node, negative for carcinoma (0/1)   Q. LYMPH NODE, LEVEL 12 #4, EXCISION:  - Lymph node, negative for carcinoma (0/1)   R. LYMPH NODE, LEVEL 11 #5, EXCISION:  - Lymph node, negative for carcinoma (0/1)   S. LYMPH NODE, LEVEL 12 #5, EXCISION:  - Lymph node, negative for carcinoma (0/1)   T. LYMPH NODE, LEVEL 13, EXCISION:  - Lymph node, negative for carcinoma (0/1)   ONCOLOGY TABLE:   LUNG: Resection   Synchronous Tumors: Not applicable  Total Number of Primary Tumors: 1  Procedure: Lobectomy, lung  Specimen Laterality: Left  Tumor Focality: Unifocal  Tumor Site: Upper lobe  Tumor Size: 2.6 cm  Histologic Type: Squamous cell carcinoma, moderately differentiated  Visceral Pleura Invasion: Not identified  Direct Invasion of Adjacent Structures: No adjacent structures present  Lymphovascular Invasion: Not identified  Margins: All margins negative for invasive carcinoma       Closest Margin(s)  to Invasive Carcinoma: Bronchovascular margin       Margin(s) Involved by Invasive Carcinoma: Not applicable        Margin Status for Non-Invasive Tumor: Negative  Treatment Effect: No known presurgical therapy  Regional Lymph Nodes:       Number of Lymph Nodes Involved: 1                            Nodal Sites with Tumor: Level 10       Number of Lymph Nodes Examined: 21                       Nodal Sites Examined: Levels 4, 5, 7, 9, 10, 11, 12  and 13  Distant Metastasis:       Distant Site(s) Involved: Not applicable  Pathologic Stage Classification (pTNM, AJCC 8th Edition): pT1c, pN1  Ancillary Studies: Can be performed upon request  Representative Tumor Block: B5  Comment(s): None   Discharge Exam: Blood pressure 134/65, pulse 73, temperature 97.7 F (36.5 C), temperature source Oral, resp. rate 15, height 5\' 4"  (1.626 m), weight 78 kg, SpO2 92 %.   General appearance: alert, cooperative, and no distress Heart: regular rate and rhythm Lungs: Diminished on the left lower fields, right is clear Abdomen: Benign exam Extremities: No edema or calf tenderness Wound: Incisions healing well without evidence of infection Disposition: Discharge disposition: 01-Home or Self Care     Stable and discharged to home.  Discharge Instructions     Discharge patient   Complete by: As directed    With home O2 at 2 liters [place]   Discharge disposition: 01-Home or Self Care   Discharge patient date: 12/21/2021      Allergies as of 12/21/2021   No Known Allergies      Medication List     STOP taking these medications    atorvastatin 10 MG tablet Commonly known as: LIPITOR   traMADol 50 MG tablet Commonly known as: ULTRAM       TAKE these medications    aspirin 81 MG tablet Take 81 mg by mouth daily.   busPIRone 15 MG tablet Commonly known as: BUSPAR TAKE 1/2 TABLET BY MOUTH TWICE A DAY What changed:  how much to take when to take this   DULoxetine 60 MG  capsule Commonly known as: CYMBALTA TAKE 1 CAPSULE BY MOUTH EVERY DAY   Melatonin 10 MG Caps Take 20 mg by mouth at bedtime.   naproxen sodium 220 MG tablet Commonly known as: ALEVE Take 440 mg by mouth daily as needed (headaches).   oxyCODONE 5 MG immediate release tablet Commonly known as: Oxy IR/ROXICODONE Take 1 tablet (5 mg total) by mouth every 6 (six) hours as needed for up to 7 days for moderate pain.   pregabalin 75 MG capsule Commonly known as: LYRICA TAKE 1 CAPSULE BY MOUTH TWICE A DAY   Trelegy Ellipta 100-62.5-25 MCG/ACT Aepb Generic drug: Fluticasone-Umeclidin-Vilant Inhale 1 puff into the lungs daily. What changed:  when to take this reasons to take this Another medication with the same name was removed. Continue taking this medication, and follow the directions you see here.        Follow-up Information     [facility] Follow up on 12/24/2021.   Why: Your appointment is at 8:00 for chest x ray Contact information: [address] [place]        [name], MD Follow up on 12/24/2021.   Specialty: Cardiothoracic Surgery Why: Appointment is at 9:00 Contact information: [address] [place] [address] [phone]                 Signed: John Giovanni, PA-C 12/22/2021, 11:45 AM

## 2021-12-13 NOTE — Progress Notes (Signed)
      [address]       [place] [address]             [phone]      Looks and feels well CXR after Ct removal shows progressive atelectasis of LLL, creating appearance of increased space/ fluid- encouraged cough, deep breathing.  Mucinex, flutter valve, will add IPPB  Remo Lipps C. Roxan Hockey, MD [facility] [phone]

## 2021-12-13 NOTE — Plan of Care (Signed)

## 2021-12-14 ENCOUNTER — Inpatient Hospital Stay (HOSPITAL_COMMUNITY): Payer: Medicare Other

## 2021-12-14 LAB — COMPREHENSIVE METABOLIC PANEL
ALT: 12 U/L (ref 0–44)
AST: 17 U/L (ref 15–41)
Albumin: 3.1 g/dL — ABNORMAL LOW (ref 3.5–5.0)
Alkaline Phosphatase: 63 U/L (ref 38–126)
Anion gap: 5 (ref 5–15)
BUN: 13 mg/dL (ref 8–23)
CO2: 28 mmol/L (ref 22–32)
Calcium: 9 mg/dL (ref 8.9–10.3)
Chloride: 103 mmol/L (ref 98–111)
Creatinine, Ser: 0.99 mg/dL (ref 0.44–1.00)
GFR, Estimated: >60 mL/min (ref >60)
Glucose, Bld: 96 mg/dL (ref 70–99)
Potassium: 5.1 mmol/L (ref 3.5–5.1)
Sodium: 136 mmol/L (ref 135–145)
Total Bilirubin: 0.4 mg/dL (ref 0.3–1.2)
Total Protein: 5.6 g/dL — ABNORMAL LOW (ref 6.5–8.1)

## 2021-12-14 LAB — CBC
HCT: 34 % — ABNORMAL LOW (ref 36.0–46.0)
Hemoglobin: 11.6 g/dL — ABNORMAL LOW (ref 12.0–15.0)
MCH: 31.4 pg (ref 26.0–34.0)
MCHC: 34.1 g/dL (ref 30.0–36.0)
MCV: 92.1 fL (ref 80.0–100.0)
Platelets: 183 10*3/uL (ref 150–400)
RBC: 3.69 MIL/uL — ABNORMAL LOW (ref 3.87–5.11)
RDW: 13.9 % (ref 11.5–15.5)
WBC: 8.9 10*3/uL (ref 4.0–10.5)
nRBC: 0 % (ref 0.0–0.2)

## 2021-12-14 MED ORDER — LIDOCAINE HCL (PF) 1 % IJ SOLN
INTRAMUSCULAR | Status: AC
  Filled 2021-12-14: qty 30

## 2021-12-14 NOTE — Progress Notes (Addendum)
      [address]       [place] [address]             [phone]      2 Days Post-Op Procedure(s) (LRB): XI ROBOTIC ASSISTED THORACOSCOPY-LEFT UPPER LOBECTOMY (Left) LYMPH NODE DISSECTION (Left) INTERCOSTAL NERVE BLOCK (Left)  Subjective:  Short of breath at times.  She has been unable to cough anything up, states her cough is dry.  +ambulation  Objective: Vital signs in last 24 hours: Temp:  [97.6 F (36.4 C)-98.5 F (36.9 C)] 98.2 F (36.8 C) (10/14 0801) Pulse Rate:  [63-72] 63 (10/14 0801) Cardiac Rhythm: Normal sinus rhythm (10/14 0700) Resp:  [15-20] 17 (10/14 0801) BP: (122-151)/(61-76) 122/68 (10/14 0801) SpO2:  [90 %-96 %] 96 % (10/14 0801)  Intake/Output from previous day: 10/13 0701 - 10/14 0700 In: 255.8 [I.V.:255.8] Out: 610 [Urine:550; Chest Tube:60]  General appearance: alert, cooperative, and no distress Heart: regular rate and rhythm Lungs: diminished breath sounds on left Abdomen: soft, non-tender; bowel sounds normal; no masses,  no organomegaly Extremities: extremities normal, atraumatic, no cyanosis or edema Wound: clean and dry  Lab Results: Recent Labs    12/13/21 0102 12/14/21 0020  WBC 9.3 8.9  HGB 11.1* 11.6*  HCT 33.0* 34.0*  PLT 184 183   BMET:  Recent Labs    12/13/21 0102 12/14/21 0020  NA 135 136  K 4.1 5.1  CL 105 103  CO2 25 28  GLUCOSE 158* 96  BUN 9 13  CREATININE 0.74 0.99  CALCIUM 8.7* 9.0    PT/INR: No results for input(s): "LABPROT", "INR" in the last 72 hours. ABG    Component Value Date/Time   PHART 7.46 (H) 12/10/2021 1435   HCO3 26.3 12/10/2021 1435   O2SAT 96.7 12/10/2021 1435   CBG (last 3)  No results for input(s): "GLUCAP" in the last 72 hours.  Assessment/Plan: S/P Procedure(s) (LRB): XI ROBOTIC ASSISTED THORACOSCOPY-LEFT UPPER LOBECTOMY (Left) LYMPH NODE DISSECTION (Left) INTERCOSTAL NERVE BLOCK (Left)  CV- Sinus Bradycardia, BP elevated at times, monitor Pulm- CT  removed yesterday, CXR with pleural effusion/atelectasis, patient on 3L oxygen wean as able, continue pulmonary toilet, Mucinex for cough... would benefit from Thoracentesis will discuss with Dr. Prescott Gum Lovenox for DVT Dispo- patient with continued atelectasis, however there is a large amount of fluid on CXR.Marland Kitchen will discuss need for Thoracentesis with Dr. Prescott Gum, wean oxygen as tolerated, continue aggressive pulmonary toilet   LOS: 2 days    Ellwood Handler, PA-C 12/14/2021  Patient feels better after left thoracentesis removal of 400 cc of pleural fluid.  Postprocedure chest x-ray is improved.  O2 saturation 96%, recheck x-ray in a.m.  patient examined and medical record reviewed,agree with above note. Dahlia Byes 12/14/2021

## 2021-12-14 NOTE — Procedures (Signed)
PROCEDURE SUMMARY:  Successful image-guided left thoracentesis. Yielded 400 milliliters of bloody fluid + air. Procedure aborted at this amount due to patient complaints of chest pain, minimal residual fluid remains on post procedure [place] and is not amenable to repeat thoracentesis at this time. Patient tolerated procedure well. EBL < 1 mL  No immediate complications.  Specimen was not sent for labs. Post procedure CXR shows decreased but persistent hydropneumothorax. Discussed with CT surgery, IR available for left chest tube placement for further drainage if desired.   Please see imaging section of Epic for full dictation.  Joaquim Nam PA-C 12/14/2021 12:54 PM

## 2021-12-14 NOTE — Progress Notes (Signed)
Mobility Specialist Progress Note    12/14/21 1541  Mobility  Activity Ambulated with assistance in hallway  Level of Assistance Contact guard assist, steadying assist  Assistive Device Other (Comment) (HHA)  Distance Ambulated (ft) 230 ft  Activity Response Tolerated fair  Mobility Referral Yes  $Mobility charge 1 Mobility   Pre-Mobility: 78 HR, 92% SpO2 During Mobility: 107 HR Post-Mobility: 76 HR, 92% SpO2  Pt received in bed and agreeable. C/o chest pain from procedure. Encouraged pursed lip breathing. SpO2 into low 80s on 2LO2, requiring 4LO2. Returned to bed with call bell in reach. RN aware.   Hildred Alamin Mobility Specialist  Secure Chat Only

## 2021-12-15 ENCOUNTER — Inpatient Hospital Stay (HOSPITAL_COMMUNITY): Payer: Medicare Other

## 2021-12-15 MED ORDER — IPRATROPIUM-ALBUTEROL 0.5-2.5 (3) MG/3ML IN SOLN
3.0000 mL | Freq: Four times a day (QID) | RESPIRATORY_TRACT | Status: DC | PRN
  Administered 2021-12-15 – 2021-12-18 (×7): 3 mL via RESPIRATORY_TRACT
  Filled 2021-12-15 (×7): qty 3

## 2021-12-15 MED ORDER — ACETYLCYSTEINE 20 % IN SOLN
2.0000 mL | Freq: Three times a day (TID) | RESPIRATORY_TRACT | Status: DC
  Administered 2021-12-15 – 2021-12-18 (×5): 2 mL via RESPIRATORY_TRACT
  Filled 2021-12-15 (×14): qty 4

## 2021-12-15 MED ORDER — FUROSEMIDE 10 MG/ML IJ SOLN
20.0000 mg | Freq: Every day | INTRAMUSCULAR | Status: DC
  Administered 2021-12-15 – 2021-12-20 (×6): 20 mg via INTRAVENOUS
  Filled 2021-12-15 (×7): qty 2

## 2021-12-15 MED ORDER — SODIUM CHLORIDE 0.9 % IV SOLN
1.0000 g | Freq: Three times a day (TID) | INTRAVENOUS | Status: DC
  Filled 2021-12-15 (×3): qty 1

## 2021-12-15 MED ORDER — SODIUM CHLORIDE 0.9 % IV SOLN
1.0000 g | Freq: Two times a day (BID) | INTRAVENOUS | Status: DC
  Administered 2021-12-15 – 2021-12-17 (×4): 1 g via INTRAVENOUS
  Filled 2021-12-15 (×5): qty 10

## 2021-12-15 NOTE — Plan of Care (Signed)

## 2021-12-15 NOTE — Progress Notes (Signed)
Mobility Specialist Progress Note    12/15/21 1055  Mobility  Activity Ambulated with assistance in hallway  Level of Assistance Standby assist, set-up cues, supervision of patient - no hands on  Assistive Device Front wheel walker  Distance Ambulated (ft) 350 ft  Activity Response Tolerated well  Mobility Referral Yes  $Mobility charge 1 Mobility   Pre-Mobility: 74 HR, 98% SpO2 During Mobility: 91 HR Post-Mobility: 67 HR, 92% SpO2  Pt received in bed and agreeable. No complaints on walk. Encouraged pursed lip breathing. SpO2 87-90% on 4LO2. Returned to bed with call bell in reach.    Hildred Alamin Mobility Specialist  Secure Chat Only

## 2021-12-15 NOTE — Progress Notes (Addendum)
      [address]       [place] [address]             [phone]      3 Days Post-Op Procedure(s) (LRB): XI ROBOTIC ASSISTED THORACOSCOPY-LEFT UPPER LOBECTOMY (Left) LYMPH NODE DISSECTION (Left) INTERCOSTAL NERVE BLOCK (Left)  Subjective:  Patient short of breath at times.  She has been able to cough up some sputum.  No BM, but is passing gas  Objective: Vital signs in last 24 hours: Temp:  [97.6 F (36.4 C)-98.2 F (36.8 C)] 97.7 F (36.5 C) (10/15 0748) Pulse Rate:  [66-89] 66 (10/15 0748) Cardiac Rhythm: Normal sinus rhythm (10/15 0700) Resp:  [15-20] 15 (10/15 0748) BP: (113-152)/(56-82) 141/81 (10/15 0748) SpO2:  [95 %-97 %] 97 % (10/15 0748)  Intake/Output from previous day: 10/14 0701 - 10/15 0700 In: 240 [P.O.:240] Out: -   General appearance: alert, cooperative, and no distress Heart: regular rate and rhythm Lungs: diminished breath sounds on left Abdomen: soft, non-tender; bowel sounds normal; no masses,  no organomegaly Extremities: extremities normal, atraumatic, no cyanosis or edema Wound: clean and dry  Lab Results: Recent Labs    12/13/21 0102 12/14/21 0020  WBC 9.3 8.9  HGB 11.1* 11.6*  HCT 33.0* 34.0*  PLT 184 183   BMET:  Recent Labs    12/13/21 0102 12/14/21 0020  NA 135 136  K 4.1 5.1  CL 105 103  CO2 25 28  GLUCOSE 158* 96  BUN 9 13  CREATININE 0.74 0.99  CALCIUM 8.7* 9.0    PT/INR: No results for input(s): "LABPROT", "INR" in the last 72 hours. ABG    Component Value Date/Time   PHART 7.46 (H) 12/10/2021 1435   HCO3 26.3 12/10/2021 1435   O2SAT 96.7 12/10/2021 1435   CBG (last 3)  No results for input(s): "GLUCAP" in the last 72 hours.  Assessment/Plan: S/P Procedure(s) (LRB): XI ROBOTIC ASSISTED THORACOSCOPY-LEFT UPPER LOBECTOMY (Left) LYMPH NODE DISSECTION (Left) INTERCOSTAL NERVE BLOCK (Left)  CV- NSR, elevated BP at times continue to monitor Pulm- remains on 3L of oxygen, CXR with large  left hydropneumothorax--may require replacement of CT vs. Continued observation.. continue aggressive pulmonary toilet Lovenox for DVT prophyalxis Dispo- patient with large left hydropneumothorax, will discuss management with Dr. Prescott Gum, repeat CXR in AM   LOS: 3 days    Ellwood Handler, PA-C 12/15/2021  Patient examined and today's chest x-ray image reviewed. Patient is stable on 3 L nasal cannula with mild rales and diminished breath sounds on the left side.  The patient is afebrile with normal WBC Chest x-ray shows decreased aeration on the left probably from reaccumulation of fluid in apical space following upper lobectomy.  To better evaluate atelectasis of the lower lobe the patient will have a CT scan of the chest today.  She is encouraged to use her incentive spirometry and flutter valve and cough and take deep breaths. Scattered rales on the right side.  We will give patient a dose of IV Lasix after she returns from CT scan.  Blood pressure (!) 167/71, pulse 68, temperature 97.6 F (36.4 C), temperature source Axillary, resp. rate 18, height 5\' 4"  (1.626 m), weight 78 kg, SpO2 95 %.

## 2021-12-16 ENCOUNTER — Encounter (HOSPITAL_COMMUNITY): Payer: Self-pay | Admitting: Thoracic Surgery (Cardiothoracic Vascular Surgery)

## 2021-12-16 ENCOUNTER — Encounter (HOSPITAL_COMMUNITY)
Admission: RE | Disposition: A | Discharge status: Home / Self Care | Payer: Self-pay | Source: Home / Self Care | Attending: Thoracic Surgery (Cardiothoracic Vascular Surgery)

## 2021-12-16 ENCOUNTER — Inpatient Hospital Stay (HOSPITAL_COMMUNITY): Payer: Medicare Other | Admitting: Anesthesiology

## 2021-12-16 ENCOUNTER — Inpatient Hospital Stay (HOSPITAL_COMMUNITY): Payer: Medicare Other

## 2021-12-16 DIAGNOSIS — D649 Anemia, unspecified: Secondary | ICD-10-CM

## 2021-12-16 DIAGNOSIS — Z87891 Personal history of nicotine dependence: Secondary | ICD-10-CM

## 2021-12-16 DIAGNOSIS — J9811 Atelectasis: Secondary | ICD-10-CM

## 2021-12-16 HISTORY — PX: VIDEO BRONCHOSCOPY: SHX5072

## 2021-12-16 LAB — CBC
HCT: 33.5 % — ABNORMAL LOW (ref 36.0–46.0)
Hemoglobin: 11.2 g/dL — ABNORMAL LOW (ref 12.0–15.0)
MCH: 30.1 pg (ref 26.0–34.0)
MCHC: 33.4 g/dL (ref 30.0–36.0)
MCV: 90.1 fL (ref 80.0–100.0)
Platelets: 207 10*3/uL (ref 150–400)
RBC: 3.72 MIL/uL — ABNORMAL LOW (ref 3.87–5.11)
RDW: 13.5 % (ref 11.5–15.5)
WBC: 7.8 10*3/uL (ref 4.0–10.5)
nRBC: 0 % (ref 0.0–0.2)

## 2021-12-16 LAB — BASIC METABOLIC PANEL
Anion gap: 5 (ref 5–15)
BUN: 11 mg/dL (ref 8–23)
CO2: 27 mmol/L (ref 22–32)
Calcium: 8.9 mg/dL (ref 8.9–10.3)
Chloride: 102 mmol/L (ref 98–111)
Creatinine, Ser: 0.63 mg/dL (ref 0.44–1.00)
GFR, Estimated: >60 mL/min (ref >60)
Glucose, Bld: 108 mg/dL — ABNORMAL HIGH (ref 70–99)
Potassium: 4.5 mmol/L (ref 3.5–5.1)
Sodium: 134 mmol/L — ABNORMAL LOW (ref 135–145)

## 2021-12-16 LAB — SURGICAL PATHOLOGY

## 2021-12-16 SURGERY — BRONCHOSCOPY, VIDEO-ASSISTED
Anesthesia: General

## 2021-12-16 MED ORDER — ORAL CARE MOUTH RINSE: 15.0000 mL | Freq: Once | OROMUCOSAL | Status: AC

## 2021-12-16 MED ORDER — LIDOCAINE 2% (20 MG/ML) 5 ML SYRINGE
INTRAMUSCULAR | Status: DC | PRN
  Administered 2021-12-16: 80 mg via INTRAVENOUS

## 2021-12-16 MED ORDER — SUGAMMADEX SODIUM 200 MG/2ML IV SOLN
INTRAVENOUS | Status: DC | PRN
  Administered 2021-12-16: 312 mg via INTRAVENOUS

## 2021-12-16 MED ORDER — MORPHINE SULFATE (PF) 2 MG/ML IV SOLN
2.0000 mg | INTRAVENOUS | Status: DC | PRN
  Administered 2021-12-18: 2 mg via INTRAVENOUS
  Filled 2021-12-16: qty 1

## 2021-12-16 MED ORDER — DEXAMETHASONE SODIUM PHOSPHATE 10 MG/ML IJ SOLN
INTRAMUSCULAR | Status: AC
  Filled 2021-12-16: qty 1

## 2021-12-16 MED ORDER — ROCURONIUM BROMIDE 10 MG/ML (PF) SYRINGE
PREFILLED_SYRINGE | INTRAVENOUS | Status: DC | PRN
  Administered 2021-12-16: 40 mg via INTRAVENOUS

## 2021-12-16 MED ORDER — CHLORHEXIDINE GLUCONATE 0.12 % MT SOLN: 15.0000 mL | Freq: Once | OROMUCOSAL | Status: AC

## 2021-12-16 MED ORDER — EPINEPHRINE PF 1 MG/ML IJ SOLN
INTRAMUSCULAR | Status: AC
  Filled 2021-12-16: qty 1

## 2021-12-16 MED ORDER — FENTANYL CITRATE (PF) 250 MCG/5ML IJ SOLN
INTRAMUSCULAR | Status: AC
  Filled 2021-12-16: qty 5

## 2021-12-16 MED ORDER — ONDANSETRON HCL 4 MG/2ML IJ SOLN
INTRAMUSCULAR | Status: AC
  Filled 2021-12-16: qty 2

## 2021-12-16 MED ORDER — PROPOFOL 10 MG/ML IV BOLUS
INTRAVENOUS | Status: DC | PRN
  Administered 2021-12-16: 140 mg via INTRAVENOUS

## 2021-12-16 MED ORDER — PHENYLEPHRINE 80 MCG/ML (10ML) SYRINGE FOR IV PUSH (FOR BLOOD PRESSURE SUPPORT)
PREFILLED_SYRINGE | INTRAVENOUS | Status: AC
  Filled 2021-12-16: qty 10

## 2021-12-16 MED ORDER — LACTATED RINGERS IV SOLN: INTRAVENOUS | Status: DC

## 2021-12-16 MED ORDER — PROPOFOL 10 MG/ML IV BOLUS
INTRAVENOUS | Status: AC
  Filled 2021-12-16: qty 20

## 2021-12-16 MED ORDER — CHLORHEXIDINE GLUCONATE 0.12 % MT SOLN
OROMUCOSAL | Status: AC
  Administered 2021-12-16: 15 mL via OROMUCOSAL
  Filled 2021-12-16: qty 15

## 2021-12-16 MED ORDER — DEXAMETHASONE SODIUM PHOSPHATE 10 MG/ML IJ SOLN
INTRAMUSCULAR | Status: DC | PRN
  Administered 2021-12-16: 10 mg via INTRAVENOUS

## 2021-12-16 MED ORDER — ROCURONIUM BROMIDE 10 MG/ML (PF) SYRINGE
PREFILLED_SYRINGE | INTRAVENOUS | Status: AC
  Filled 2021-12-16: qty 10

## 2021-12-16 MED ORDER — ONDANSETRON HCL 4 MG/2ML IJ SOLN: 4.0000 mg | Freq: Once | INTRAMUSCULAR | Status: DC | PRN

## 2021-12-16 MED ORDER — LIDOCAINE 2% (20 MG/ML) 5 ML SYRINGE
INTRAMUSCULAR | Status: AC
  Filled 2021-12-16: qty 5

## 2021-12-16 MED ORDER — ONDANSETRON HCL 4 MG/2ML IJ SOLN
INTRAMUSCULAR | Status: DC | PRN
  Administered 2021-12-16: 4 mg via INTRAVENOUS

## 2021-12-16 MED ORDER — FENTANYL CITRATE (PF) 100 MCG/2ML IJ SOLN: 25.0000 ug | INTRAMUSCULAR | Status: DC | PRN

## 2021-12-16 MED ORDER — SORBITOL 70 % SOLN
30.0000 mL | Freq: Once | Status: DC
  Filled 2021-12-16: qty 30

## 2021-12-16 MED ORDER — FENTANYL CITRATE (PF) 250 MCG/5ML IJ SOLN
INTRAMUSCULAR | Status: DC | PRN
  Administered 2021-12-16: 50 ug via INTRAVENOUS

## 2021-12-16 SURGICAL SUPPLY — 40 items
ADAPTER VALVE BIOPSY EBUS (MISCELLANEOUS) IMPLANT
ADPTR VALVE BIOPSY EBUS (MISCELLANEOUS)
APL SWBSTK 6 STRL LF DISP (MISCELLANEOUS) ×1
APPLICATOR COTTON TIP 6 STRL (MISCELLANEOUS) ×1 IMPLANT
APPLICATOR COTTON TIP 6IN STRL (MISCELLANEOUS) ×1 IMPLANT
BLADE CLIPPER SURG (BLADE) ×1 IMPLANT
BRUSH CYTOL CELLEBRITY 1.5X140 (MISCELLANEOUS) IMPLANT
CANISTER SUCT 3000ML PPV (MISCELLANEOUS) ×1 IMPLANT
CNTNR URN SCR LID CUP LEK RST (MISCELLANEOUS) ×2 IMPLANT
CONT SPEC 4OZ STRL OR WHT (MISCELLANEOUS) ×2
COVER BACK TABLE 60X90IN (DRAPES) ×1 IMPLANT
FILTER STRAW FLUID ASPIR (MISCELLANEOUS) IMPLANT
FORCEPS BIOP RJ4 1.8 (CUTTING FORCEPS) IMPLANT
FORCEPS RADIAL JAW LRG 4 PULM (INSTRUMENTS) IMPLANT
GAUZE 4X4 16PLY ~~LOC~~+RFID DBL (SPONGE) ×1 IMPLANT
GAUZE SPONGE 4X4 12PLY STRL (GAUZE/BANDAGES/DRESSINGS) ×1 IMPLANT
GLOVE SS BIOGEL STRL SZ 7.5 (GLOVE) ×1 IMPLANT
GLOVE SURG SIGNA 7.5 PF LTX (GLOVE) ×1 IMPLANT
GOWN STRL REUS W/ TWL LRG LVL3 (GOWN DISPOSABLE) ×1 IMPLANT
GOWN STRL REUS W/ TWL XL LVL3 (GOWN DISPOSABLE) ×1 IMPLANT
GOWN STRL REUS W/TWL LRG LVL3 (GOWN DISPOSABLE) ×1
GOWN STRL REUS W/TWL XL LVL3 (GOWN DISPOSABLE) ×1
KIT CLEAN ENDO COMPLIANCE (KITS) ×1 IMPLANT
KIT TURNOVER KIT B (KITS) ×1 IMPLANT
MARKER SKIN DUAL TIP RULER LAB (MISCELLANEOUS) ×1 IMPLANT
NS IRRIG 1000ML POUR BTL (IV SOLUTION) ×1 IMPLANT
OIL SILICONE PENTAX (PARTS (SERVICE/REPAIRS)) ×1 IMPLANT
PAD ARMBOARD 7.5X6 YLW CONV (MISCELLANEOUS) ×2 IMPLANT
SPONGE T-LAP 18X18 ~~LOC~~+RFID (SPONGE) ×4 IMPLANT
SPONGE T-LAP 4X18 ~~LOC~~+RFID (SPONGE) ×1 IMPLANT
SYR 20ML ECCENTRIC (SYRINGE) ×2 IMPLANT
SYR 5ML LL (SYRINGE) ×1 IMPLANT
SYR 5ML LUER SLIP (SYRINGE) ×1 IMPLANT
TOWEL GREEN STERILE (TOWEL DISPOSABLE) ×1 IMPLANT
TOWEL GREEN STERILE FF (TOWEL DISPOSABLE) ×1 IMPLANT
TRAP SPECIMEN MUCUS 40CC (MISCELLANEOUS) ×1 IMPLANT
TUBE CONNECTING 20X1/4 (TUBING) ×1 IMPLANT
VALVE BIOPSY  SINGLE USE (MISCELLANEOUS) ×1
VALVE BIOPSY SINGLE USE (MISCELLANEOUS) ×1 IMPLANT
VALVE SUCTION BRONCHIO DISP (MISCELLANEOUS) ×1 IMPLANT

## 2021-12-16 NOTE — Care Management Important Message (Signed)
Important Message  Patient Details  Name: [name] [name] MRN: [id number] Date of Birth: 28-Aug-1950   Medicare Important Message Given:  Yes     Orbie Pyo 12/16/2021, 3:27 PM

## 2021-12-16 NOTE — Anesthesia Procedure Notes (Signed)
Procedure Name: Intubation Date/Time: 12/16/2021 3:03 PM  Performed by: Lorie Phenix, CRNAPre-anesthesia Checklist: Patient identified, Emergency Drugs available, Suction available and Patient being monitored Patient Re-evaluated:Patient Re-evaluated prior to induction Oxygen Delivery Method: Circle system utilized Preoxygenation: Pre-oxygenation with 100% oxygen Induction Type: IV induction Ventilation: Mask ventilation without difficulty and Oral airway inserted - appropriate to patient size Laryngoscope Size: Mac and 3 Grade View: Grade I Tube type: Oral Tube size: 8.5 mm Number of attempts: 1 Airway Equipment and Method: Stylet and Oral airway Placement Confirmation: ETT inserted through vocal cords under direct vision, positive ETCO2 and breath sounds checked- equal and bilateral Secured at: 22 cm Tube secured with: Tape Dental Injury: Teeth and Oropharynx as per pre-operative assessment

## 2021-12-16 NOTE — Anesthesia Preprocedure Evaluation (Addendum)
Anesthesia Evaluation  Patient identified by MRN, date of birth, ID band Patient awake    Reviewed: Allergy & Precautions, NPO status , Patient's Chart, lab work & pertinent test results  Airway Mallampati: II  TM Distance: >3 FB Neck ROM: Full    Dental  (+) Dental Advisory Given, Edentulous Upper, Edentulous Lower   Pulmonary former smoker Mucus plug LUL mass s/p LEFT UPPER LOBECTOMY 12/12/21    + decreased breath sounds      Cardiovascular negative cardio ROS Normal cardiovascular exam Rhythm:Regular Rate:Normal     Neuro/Psych  PSYCHIATRIC DISORDERS  Depression    negative neurological ROS     GI/Hepatic negative GI ROS, Neg liver ROS,,,  Endo/Other  negative endocrine ROS    Renal/GU negative Renal ROS     Musculoskeletal  (+) Arthritis , Osteoarthritis,  Fibromyalgia -  Abdominal   Peds  Hematology  (+) Blood dyscrasia, anemia   Anesthesia Other Findings Day of surgery medications reviewed with the patient.  Reproductive/Obstetrics                             Anesthesia Physical Anesthesia Plan  ASA: 3  Anesthesia Plan: General   Post-op Pain Management: Tylenol PO (pre-op)* and Minimal or no pain anticipated   Induction: Intravenous  PONV Risk Score and Plan: 3 and Dexamethasone and Ondansetron  Airway Management Planned: Oral ETT  Additional Equipment:   Intra-op Plan:   Post-operative Plan: Extubation in OR  Informed Consent: I have reviewed the patients History and Physical, chart, labs and discussed the procedure including the risks, benefits and alternatives for the proposed anesthesia with the patient or authorized representative who has indicated his/her understanding and acceptance.     Dental advisory given  Plan Discussed with: CRNA  Anesthesia Plan Comments:         Anesthesia Quick Evaluation

## 2021-12-16 NOTE — Interval H&P Note (Signed)
History and Physical Interval Note:  12/16/2021 2:39 PM  [name] [name]  has presented today for surgery, with the diagnosis of Muccus Plug.  The various methods of treatment have been discussed with the patient and family. After consideration of risks, benefits and other options for treatment, the patient has consented to  Procedure(s): VIDEO BRONCHOSCOPY (N/A) as a surgical intervention.  The patient's history has been reviewed, patient examined, no change in status, stable for surgery.  I have reviewed the patient's chart and labs.  Questions were answered to the patient's satisfaction.     [name]

## 2021-12-16 NOTE — Transfer of Care (Signed)
Immediate Anesthesia Transfer of Care Note  Patient: [name] [name]  Procedure(s) Performed: VIDEO BRONCHOSCOPY  Patient Location: PACU  Anesthesia Type:General  Level of Consciousness: awake and alert   Airway & Oxygen Therapy: Patient Spontanous Breathing and Patient connected to nasal cannula oxygen  Post-op Assessment: Report given to RN and Post -op Vital signs reviewed and stable  Post vital signs: Reviewed and stable  Last Vitals:  Vitals Value Taken Time  BP 146/70 12/16/21 1530  Temp    Pulse 89 12/16/21 1531  Resp 22 12/16/21 1531  SpO2 90 % 12/16/21 1531  Vitals shown include unvalidated device data.  Last Pain:  Vitals:   12/16/21 1228  TempSrc: Oral  PainSc: 0-No pain      Patients Stated Pain Goal: 2 (20/23/34 3568)  Complications: No notable events documented.

## 2021-12-16 NOTE — Brief Op Note (Addendum)
12/16/2021  3:27 PM  PATIENT:  [name] [name]  71 y.o. female  PRE-OPERATIVE DIAGNOSIS:  Left lower lobe atelectasis secondary to mucous plug  POST-OPERATIVE DIAGNOSIS:  Left lower lobe atelectasis secondary to mucous plug  PROCEDURE:  Procedure(s): VIDEO BRONCHOSCOPY (N/A)  SURGEON:  Surgeon(s) and Role:    * [name], MD - Primary  PHYSICIAN ASSISTANT:   ASSISTANTS: none   ANESTHESIA:   general  EBL: none  BLOOD ADMINISTERED:none  DRAINS: none   LOCAL MEDICATIONS USED:  NONE  SPECIMEN:  No Specimen  DISPOSITION OF SPECIMEN:  N/A  COUNTS:  NO endo  TOURNIQUET:  * No tourniquets in log *  DICTATION: .Other Dictation: Dictation Number -  PLAN OF CARE: Admit to inpatient   PATIENT DISPOSITION:  PACU - hemodynamically stable.   Delay start of Pharmacological VTE agent (>24hrs) due to surgical blood loss or risk of bleeding: no

## 2021-12-16 NOTE — Op Note (Signed)
NAME: [name], [name] MEDICAL RECORD NO: [id number] ACCOUNT NO: [id number] DATE OF BIRTH: 1950/12/23 FACILITY: [facility] LOCATION: [facility] PHYSICIAN: Revonda Standard. Roxan Hockey, MD  Operative Report   DATE OF PROCEDURE: 12/16/2021  PREOPERATIVE DIAGNOSIS:  Left lower lobe atelectasis secondary to mucus plug.  POSTOPERATIVE DIAGNOSIS:  Left lower lobe atelectasis secondary to mucus plug.  PROCEDURE:  Video bronchoscopy.  SURGEON:  Revonda Standard. Roxan Hockey, MD  ASSISTANT:  None.  ANESTHESIA:  General.  FINDINGS:  A large amount of thick mucus in airway.  Edema at the origin of left lower lobe bronchus.  Left upper lobe staple line intact.  Normal endobronchial anatomy on the right.  CLINICAL NOTE:  [name] [name] is a 71 year old woman who had recently undergone a left upper lobectomy.  She developed dense consolidation of the left lower lobe that persisted despite nasotracheal suctioning.  CT showed a consolidation of the lower  lobe and the airway filled with mucus.  She was advised to undergo bronchoscopy for evacuation of mucus plugs.  The indications, risks, benefits, and alternatives were discussed in detail with the patient.  She understood and accepted the risks and  agreed to proceed.  DESCRIPTION OF PROCEDURE:  [name] [name] was brought to the operating room on 12/16/2021.  She had induction of general anesthesia.  Sequential compression devices were placed for DVT prophylaxis.  A timeout was performed.  Flexible fiberoptic bronchoscopy  was performed.  There was a large amount of mucus within the left mainstem bronchus.  The mucous was evacuated and cleared with saline.  Inspection of the right side revealed normal endobronchial anatomy with no endobronchial lesions. Moving the scope back to  the left, there was still thick inspissated mucus in the left lower lobe bronchus.  The left upper lobe bronchial stump was intact.  There was some edema around the origin of the left lower lobe  bronchus.  The airway was irrigated with saline repeatedly  until all the mucus plugging had cleared. The bronchoscope was advanced into the superior segmental bronchus as well as the basilar segmental trunk on the left lower lobe and there were no endobronchial lesions seen.  The bronchoscope was removed.  The  patient was extubated in the operating room and taken to the postanesthetic care unit in good condition.   MUK D: 12/16/2021 3:36:11 pm T: 12/16/2021 10:35:00 pm  JOB: [id number]/ [id number]

## 2021-12-16 NOTE — H&P (View-Only) (Signed)
      [address]       [place] [address]             [phone]       4 Days Post-Op Procedure(s) (LRB): XI ROBOTIC ASSISTED THORACOSCOPY-LEFT UPPER LOBECTOMY (Left) LYMPH NODE DISSECTION (Left) INTERCOSTAL NERVE BLOCK (Left)  Subjective: Patient passing gas but no bowel movement. She has left shoulder pain this am  Objective: Vital signs in last 24 hours: Temp:  [97.6 F (36.4 C)-98.5 F (36.9 C)] 98.5 F (36.9 C) (10/16 0336) Pulse Rate:  [66-79] 68 (10/16 0336) Cardiac Rhythm: Normal sinus rhythm (10/16 0700) Resp:  [15-23] 19 (10/16 0336) BP: (141-170)/(71-81) 170/74 (10/16 0336) SpO2:  [92 %-97 %] 92 % (10/16 0336)     Intake/Output from previous day: No intake/output data recorded.   Physical Exam:  Cardiovascular: RRR Pulmonary: Rales on left Abdomen: Soft, non tender, bowel sounds present. Extremities: No lower extremity edema. Wounds: Clean and dry.  No erythema or signs of infection.   Lab Results: CBC: Recent Labs    12/14/21 0020 12/16/21 0018  WBC 8.9 7.8  HGB 11.6* 11.2*  HCT 34.0* 33.5*  PLT 183 207   BMET:  Recent Labs    12/14/21 0020 12/16/21 0018  NA 136 134*  K 5.1 4.5  CL 103 102  CO2 28 27  GLUCOSE 96 108*  BUN 13 11  CREATININE 0.99 0.63  CALCIUM 9.0 8.9    PT/INR: No results for input(s): "LABPROT", "INR" in the last 72 hours. ABG:  INR: Will add last result for INR, ABG once components are confirmed Will add last 4 CBG results once components are confirmed  Assessment/Plan:  1. CV - SR, hypertensive earlier this am. 2.  Pulmonary - S/p left thoracentesis on 10/14 (400 ml removed). On 2 liters of oxygen via . CT chest done yesterday shows asymmetric elevation of left hemidiaphragm with moderate to large left hydropneumothorax). CXR this am appears similar to yesterday's (large left hydropneumothorax with small left apical space,trace right pleural effusion/atelectasis). Will discuss with D.r  Roxan [name]. Encourage incentive spirometer and flutter valve. Awaiting final pathology results. 3. Mild expected blood loss anemia-H and H this am stable at 11.2 and 33.5 4. ID-on Cefepime (started 10/15). WBC this am 7800 and remains afebrile. 5. On Lovenox for DVT prophylaxis 6. LOC constipation  [name] M ZimmermanPA-C 12/16/2021,7:04 AM  Patient seen and examined, agree with above Persistent atelectasis despite pulmonary hygiene, mucomyst and NT suctioning Will plan to do bronchoscopy for mucous plugging this afternoon I informed her of the indications, risks, benefits and alternatives  [name] Lipps C. Roxan Hockey, MD [facility] [phone]

## 2021-12-16 NOTE — Progress Notes (Addendum)
      [address]       [place] [address]             [phone]       4 Days Post-Op Procedure(s) (LRB): XI ROBOTIC ASSISTED THORACOSCOPY-LEFT UPPER LOBECTOMY (Left) LYMPH NODE DISSECTION (Left) INTERCOSTAL NERVE BLOCK (Left)  Subjective: Patient passing gas but no bowel movement. She has left shoulder pain this am  Objective: Vital signs in last 24 hours: Temp:  [97.6 F (36.4 C)-98.5 F (36.9 C)] 98.5 F (36.9 C) (10/16 0336) Pulse Rate:  [66-79] 68 (10/16 0336) Cardiac Rhythm: Normal sinus rhythm (10/16 0700) Resp:  [15-23] 19 (10/16 0336) BP: (141-170)/(71-81) 170/74 (10/16 0336) SpO2:  [92 %-97 %] 92 % (10/16 0336)     Intake/Output from previous day: No intake/output data recorded.   Physical Exam:  Cardiovascular: RRR Pulmonary: Rales on left Abdomen: Soft, non tender, bowel sounds present. Extremities: No lower extremity edema. Wounds: Clean and dry.  No erythema or signs of infection.   Lab Results: CBC: Recent Labs    12/14/21 0020 12/16/21 0018  WBC 8.9 7.8  HGB 11.6* 11.2*  HCT 34.0* 33.5*  PLT 183 207   BMET:  Recent Labs    12/14/21 0020 12/16/21 0018  NA 136 134*  K 5.1 4.5  CL 103 102  CO2 28 27  GLUCOSE 96 108*  BUN 13 11  CREATININE 0.99 0.63  CALCIUM 9.0 8.9    PT/INR: No results for input(s): "LABPROT", "INR" in the last 72 hours. ABG:  INR: Will add last result for INR, ABG once components are confirmed Will add last 4 CBG results once components are confirmed  Assessment/Plan:  1. CV - SR, hypertensive earlier this am. 2.  Pulmonary - S/p left thoracentesis on 10/14 (400 ml removed). On 2 liters of oxygen via [place]. CT chest done yesterday shows asymmetric elevation of left hemidiaphragm with moderate to large left hydropneumothorax). CXR this am appears similar to yesterday's (large left hydropneumothorax with small left apical space,trace right pleural effusion/atelectasis). Will discuss with D.r  Roxan Hockey. Encourage incentive spirometer and flutter valve. Awaiting final pathology results. 3. Mild expected blood loss anemia-H and H this am stable at 11.2 and 33.5 4. ID-on Cefepime (started 10/15). WBC this am 7800 and remains afebrile. 5. On Lovenox for DVT prophylaxis 6. LOC constipation  Donielle M ZimmermanPA-C 12/16/2021,7:04 AM  Patient seen and examined, agree with above Persistent atelectasis despite pulmonary hygiene, mucomyst and NT suctioning Will plan to do bronchoscopy for mucous plugging this afternoon I informed her of the indications, risks, benefits and alternatives  Remo Lipps C. Roxan Hockey, MD [facility] [phone]

## 2021-12-16 NOTE — Anesthesia Postprocedure Evaluation (Signed)
Anesthesia Post Note  Patient: [name] [name]  Procedure(s) Performed: VIDEO BRONCHOSCOPY     Patient location during evaluation: PACU Anesthesia Type: General Level of consciousness: awake and alert Pain management: pain level controlled Vital Signs Assessment: post-procedure vital signs reviewed and stable Respiratory status: spontaneous breathing, nonlabored ventilation, respiratory function stable and patient connected to nasal cannula oxygen Cardiovascular status: blood pressure returned to baseline and stable Postop Assessment: no apparent nausea or vomiting Anesthetic complications: no   No notable events documented.  Last Vitals:  Vitals:   12/16/21 1600 12/16/21 1613  BP:  (!) 145/70  Pulse: 76 81  Resp: 20 20  Temp: 36.7 C   SpO2: 95% 92%    Last Pain:  Vitals:   12/16/21 1613  TempSrc:   PainSc: 0-No pain                 Santa Lighter

## 2021-12-16 NOTE — Progress Notes (Signed)
Mobility Specialist Progress Note    12/16/21 1013  Mobility  Activity Ambulated with assistance in hallway  Level of Assistance Standby assist, set-up cues, supervision of patient - no hands on  Assistive Device Front wheel walker  Distance Ambulated (ft) 350 ft  Activity Response Tolerated well  Mobility Referral Yes  $Mobility charge 1 Mobility   Pre-Mobility: 72 HR, 95% SpO2 on 2LO2 During Mobility: 102 HR, 88-90% SpO2 on 3LO2 Post-Mobility: 85 HR, 94% SpO2  Pt received in bed and agreeable. No complaints on walk. Encouraged pursed lip breathing. SpO2 88-90% on 3LO2. Returned to BR. Encouraged to pull string for assistance.   Hildred Alamin Mobility Specialist  Secure Chat Only

## 2021-12-17 ENCOUNTER — Encounter (HOSPITAL_COMMUNITY): Payer: Self-pay | Admitting: Thoracic Surgery (Cardiothoracic Vascular Surgery)

## 2021-12-17 ENCOUNTER — Inpatient Hospital Stay (HOSPITAL_COMMUNITY): Payer: Medicare Other

## 2021-12-17 MED ORDER — POLYETHYLENE GLYCOL 3350 17 G PO PACK
17.0000 g | PACK | Freq: Every day | ORAL | Status: DC
  Administered 2021-12-17 – 2021-12-20 (×4): 17 g via ORAL
  Filled 2021-12-17 (×4): qty 1

## 2021-12-17 MED ORDER — SODIUM CHLORIDE 0.9 % IV SOLN
2.0000 g | Freq: Three times a day (TID) | INTRAVENOUS | Status: DC
  Administered 2021-12-17 – 2021-12-21 (×12): 2 g via INTRAVENOUS
  Filled 2021-12-17 (×12): qty 12.5

## 2021-12-17 NOTE — Progress Notes (Signed)
Mobility Specialist Progress Note   12/17/21 1000  Mobility  Activity Ambulated with assistance in hallway  Level of Assistance Standby assist, set-up cues, supervision of patient - no hands on  Assistive Device None  Distance Ambulated (ft) 350 ft  Range of Motion/Exercises Active;All extremities  Activity Response Tolerated well   Pre Mobility:  HR 76,  SpO2 93% 4LO2 During Mobility: SpO2 83% 3LO2; 90% 4LO2 Post Mobility: SpO2 96% 4LO2  Patient received in supine, agreeable to participate in mobility. Ambulated supervision level with steady gait. Returned to room without complaint or incident. Of note, was unable to accurately monitor HR during ambulation as lead set or monitor was intermittently malfunctioning. Oxygen saturation lingered mid-low 80's on 3LO2, maintained >90% on 4LO2. Was left in supine with all needs met, call bell in reach.    [name] [name], [place], [place]  Office: [phone]

## 2021-12-17 NOTE — Progress Notes (Addendum)
      [address]       [place] [address]             [phone]       1 Day Post-Op Procedure(s) (LRB): VIDEO BRONCHOSCOPY (N/A)  Subjective: Patient had bowel movement. She has no specific complaint this am.  Objective: Vital signs in last 24 hours: Temp:  [97.8 F (36.6 C)-98.7 F (37.1 C)] 97.8 F (36.6 C) (10/17 0315) Pulse Rate:  [59-90] 68 (10/17 0315) Cardiac Rhythm: Normal sinus rhythm (10/16 2000) Resp:  [15-25] 15 (10/17 0315) BP: (124-182)/(64-83) 124/65 (10/17 0315) SpO2:  [90 %-99 %] 99 % (10/17 0315) FiO2 (%):  [28 %] 28 % (10/16 0920)     Intake/Output from previous day: 10/16 0701 - 10/17 0700 In: 500 [I.V.:300; IV Piggyback:200] Out: 0    Physical Exam:  Cardiovascular: RRR Pulmonary: Clear on the right and diminished on left Abdomen: Soft, non tender, bowel sounds present. Extremities: No lower extremity edema. Wounds: Clean and dry.  No erythema or signs of infection.   Lab Results: CBC: Recent Labs    12/16/21 0018  WBC 7.8  HGB 11.2*  HCT 33.5*  PLT 207    BMET:  Recent Labs    12/16/21 0018  NA 134*  K 4.5  CL 102  CO2 27  GLUCOSE 108*  BUN 11  CREATININE 0.63  CALCIUM 8.9     PT/INR: No results for input(s): "LABPROT", "INR" in the last 72 hours. ABG:  INR: Will add last result for INR, ABG once components are confirmed Will add last 4 CBG results once components are confirmed  Assessment/Plan:  1. CV - SR, hypertensive earlier this am. 2.  Pulmonary - S/p left thoracentesis on 10/14 (400 ml removed). S/p video bronchoscopy for mucous plugging. On 2 liters of oxygen via .  CXR this am appears similar to yesterday's (large left hydropneumothorax with small left apical space,trace right pleural effusion/atelectasis). Encourage incentive spirometer and flutter valve. Final pathology shows: TNM Code:pT1c, pN1  3. Mild expected blood loss anemia-H and H this am stable at 11.2 and 33.5 4. ID-on  Cefepime (started 10/15). Last WBC 7800 and remains afebrile. Gram stain show few gram positive cocci, rare gram negative rods 5. On Lovenox for DVT prophylaxis   Donielle M ZimmermanPA-C 12/17/2021,7:29 AM Patient seen and examined, agree with above Still has dense consolidation of left lower lobe as expected- will take days to weeks to clear up. Has a space partially filled with fluid. Continue IS, flutter, mucomyst, bronchodiulators Continue maxipime for now Continue to ambulate  Remo Lipps C. Roxan Hockey, MD [facility] [phone]

## 2021-12-17 NOTE — Plan of Care (Signed)

## 2021-12-18 ENCOUNTER — Inpatient Hospital Stay (HOSPITAL_COMMUNITY): Payer: Medicare Other

## 2021-12-18 LAB — CULTURE, RESPIRATORY W GRAM STAIN
Culture: NORMAL
Special Requests: NORMAL

## 2021-12-18 MED ORDER — KETOROLAC TROMETHAMINE 30 MG/ML IJ SOLN
15.0000 mg | Freq: Four times a day (QID) | INTRAMUSCULAR | Status: AC
  Administered 2021-12-18 – 2021-12-19 (×2): 15 mg via INTRAVENOUS
  Filled 2021-12-18 (×2): qty 1

## 2021-12-18 MED ORDER — PREGABALIN 50 MG PO CAPS: 100.0000 mg | ORAL_CAPSULE | Freq: Two times a day (BID) | ORAL | Status: DC

## 2021-12-18 MED ORDER — PREGABALIN 75 MG PO CAPS
150.0000 mg | ORAL_CAPSULE | Freq: Two times a day (BID) | ORAL | Status: DC
  Administered 2021-12-18 – 2021-12-20 (×6): 150 mg via ORAL
  Filled 2021-12-18 (×6): qty 2

## 2021-12-18 MED ORDER — KETOROLAC TROMETHAMINE 30 MG/ML IJ SOLN: 15.0000 mg | Freq: Four times a day (QID) | INTRAMUSCULAR | Status: DC

## 2021-12-18 NOTE — Progress Notes (Signed)
RT note-had patient use flutter value while taking treatment, good effort and mild increase work of breathing. Remained on room air, spo2 93%

## 2021-12-18 NOTE — Discharge Instructions (Signed)
Robot-Assisted Thoracic Surgery, Care After The following information offers guidance on how to care for yourself after your procedure. Your health care provider may also give you more specific instructions. If you have problems or questions, contact your health care provider. What can I expect after the procedure? After the procedure, it is common to have: Some pain and aches in the area of your surgical incisions. Pain when breathing in (inhaling) and coughing. Tiredness (fatigue). Trouble sleeping. Constipation. Follow these instructions at home: Medicines Take over-the-counter and prescription medicines only as told by your health care provider. If you were prescribed an antibiotic medicine, take it as told by your health care provider. Do not stop taking the antibiotic even if you start to feel better. Talk with your health care provider about safe and effective ways to manage pain after your procedure. Pain management should fit your specific health needs. Take pain medicine before pain becomes severe. Relieving and controlling your pain will make breathing easier for you. Ask your health care provider if the medicine prescribed to you requires you to avoid driving or using machinery. Eating and drinking Follow instructions from your health care provider about eating or drinking restrictions. These will vary depending on what procedure you had. Your health care provider may recommend: A liquid diet or soft diet for the first few days. Meals that are smaller and more frequent. A diet of fruits, vegetables, whole grains, and low-fat proteins. Limiting foods that are high in fat and processed sugar, including fried or sweet foods. Incision care Follow instructions from your health care provider about how to take care of your incisions. Make sure you: Wash your hands with soap and water for at least 20 seconds before and after you change your bandage (dressing). If soap and water are not  available, use hand sanitizer. Change your dressing as told by your health care provider. Leave stitches (sutures), skin glue, or adhesive strips in place. These skin closures may need to stay in place for 2 weeks or longer. If adhesive strip edges start to loosen and curl up, you may trim the loose edges. Do not remove adhesive strips completely unless your health care provider tells you to do that. Check your incision area every day for signs of infection. Check for: Redness, swelling, or more pain. Fluid or blood. Warmth. Pus or a bad smell. Activity Return to your normal activities as told by your health care provider. Ask your health care provider what activities are safe for you. Ask your health care provider when it is safe for you to drive. Do not lift anything that is heavier than 10 lb (4.5 kg), or the limit that you are told, until your health care provider says that it is safe. Rest as told by your health care provider. Avoid sitting for a long time without moving. Get up to take short walks every 1-2 hours. This is important to improve blood flow and breathing. Ask for help if you feel weak or unsteady. Do exercises as told by your health care provider. Pneumonia prevention  Do deep breathing exercises and cough regularly as directed. This helps clear mucus and opens your lungs. Doing this helps prevent lung infection (pneumonia). If you were given an incentive spirometer, use it as told. An incentive spirometer is a tool that measures how well you are filling your lungs with each breath. Coughing may hurt less if you try to support your chest. This is called splinting. Try one of these when you  cough: Hold a pillow against your chest. Place the palms of both hands on top of your incision area. Do not use any products that contain nicotine or tobacco. These products include cigarettes, chewing tobacco, and vaping devices, such as e-cigarettes. If you need help quitting, ask your  health care provider. Avoid secondhand smoke. General instructions If you have a drainage tube: Follow instructions from your health care provider about how to take care of it. Do not travel by airplane after your tube is removed until your health care provider tells you it is safe. You may need to take these actions to prevent or treat constipation: Drink enough fluid to keep your urine pale yellow. Take over-the-counter or prescription medicines. Eat foods that are high in fiber, such as beans, whole grains, and fresh fruits and vegetables. Limit foods that are high in fat and processed sugars, such as fried or sweet foods. Keep all follow-up visits. This is important. Contact a health care provider if: You have redness, swelling, or more pain around an incision. You have fluid or blood coming from an incision. An incision feels warm to the touch. You have pus or a bad smell coming from an incision. You have a fever. You cannot eat or drink without vomiting. Your pain medicine is not controlling your pain. Get help right away if: You have chest pain. Your heart is beating quickly. You have trouble breathing. You have trouble speaking. You are confused. You feel weak or dizzy, or you faint. These symptoms may represent a serious problem that is an emergency. Do not wait to see if the symptoms will go away. Get medical help right away. Call your local emergency services (911 in the [place] Do not drive yourself to the hospital. Summary Talk with your health care provider about safe and effective ways to manage pain after your procedure. Pain management should fit your specific health needs. Return to your normal activities as told by your health care provider. Ask your health care provider what activities are safe for you. Do deep breathing exercises and cough regularly as directed. This helps to clear mucus and prevent pneumonia. If it hurts to cough, ease pain by holding a pillow  against your chest or by placing the palms of both hands over your incisions. This information is not intended to replace advice given to you by your health care provider. Make sure you discuss any questions you have with your health care provider. Document Revised: 11/11/2019 Document Reviewed: 11/11/2019 Elsevier Patient Education  [place].

## 2021-12-18 NOTE — Progress Notes (Signed)
Mobility Specialist Progress Note    12/18/21 0958  Mobility  Activity Ambulated independently in hallway  Level of Assistance Standby assist, set-up cues, supervision of patient - no hands on  Assistive Device None  Distance Ambulated (ft) 420 ft  Activity Response Tolerated well  Mobility Referral Yes  $Mobility charge 1 Mobility   Pre-Mobility: 78 HR During Mobility: 112 HR Post-Mobility: 85 HR  Pt received in chair and agreeable. No complaints on walk. Encouraged pursed lip breathing. SpO2 pleth inconsistent. Returned to chair with call bell in reach. RN notified SpO2 in 80s on RA upon return.   Hildred Alamin Mobility Specialist  Secure Chat Only

## 2021-12-18 NOTE — Plan of Care (Signed)

## 2021-12-18 NOTE — Progress Notes (Addendum)
      [address]       [place] [address]             [phone]       2 Days Post-Op Procedure(s) (LRB): VIDEO BRONCHOSCOPY (N/A)  Subjective: Patient having pain across chest.  Objective: Vital signs in last 24 hours: Temp:  [98 F (36.7 C)-98.5 F (36.9 C)] 98.5 F (36.9 C) (10/18 0322) Pulse Rate:  [68-81] 75 (10/18 0322) Cardiac Rhythm: Normal sinus rhythm (10/18 0700) Resp:  [15-20] 17 (10/18 0322) BP: (118-154)/(58-70) 127/58 (10/18 0322) SpO2:  [93 %-98 %] 97 % (10/18 0322) FiO2 (%):  [36 %] 36 % (10/17 1317)     Intake/Output from previous day: 10/17 0701 - 10/18 0700 In: 694.8 [I.V.:494.8; IV Piggyback:200] Out: -    Physical Exam:  Cardiovascular: RRR Pulmonary: Clear on the right and diminished on left Abdomen: Soft, non tender, bowel sounds present. Extremities: No lower extremity edema. Wounds: Clean and dry.  No erythema or signs of infection.   Lab Results: CBC: Recent Labs    12/16/21 0018  WBC 7.8  HGB 11.2*  HCT 33.5*  PLT 207    BMET:  Recent Labs    12/16/21 0018  NA 134*  K 4.5  CL 102  CO2 27  GLUCOSE 108*  BUN 11  CREATININE 0.63  CALCIUM 8.9     PT/INR: No results for input(s): "LABPROT", "INR" in the last 72 hours. ABG:  INR: Will add last result for INR, ABG once components are confirmed Will add last 4 CBG results once components are confirmed  Assessment/Plan:  1. CV - SR. 2.  Pulmonary - S/p left thoracentesis on 10/14 (400 ml removed). S/p video bronchoscopy for mucous plugging. On 4 liters of oxygen via [place].  CXR this am appears similar to yesterday's (left dense consolidation, left apical space with fluid). Encourage incentive spirometer and flutter valve. Continue Mucomyst, Breo Ellipta, Duo Neb PRN 3. Mild expected blood loss anemia-H and H this am stable at 11.2 and 33.5 4. ID-on Cefepime (started 10/15). Last WBC 7800 and remains afebrile. Gram stain show few gram positive cocci,  rare gram negative rods. Culture re incubated for growth 5. On Lovenox for DVT prophylaxis 6. Regarding pain control, on MS PRN, Oxy PRN, Lyrica 75 mg bid (history of fibromyalgia and post herpetic neuralgia). Will give a few more doses of Toradol and increase Lyrica to 150 mg bid  Donielle M ZimmermanPA-C 12/18/2021,7:16 AM  Patient seen and examined, agree with above She actually looks great.  CXR unchanged.  It will take awhile for the airspaces to open up.  She is still having trouble clearing mucous.   Pain is acroos costal margin to anterior abdominal wall c/w intercostal neuralgia.  I discussed increasing Lyrica dose and she is agreeable to that. 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  Revonda Standard Roxan Hockey, MD [facility] [phone]

## 2021-12-19 ENCOUNTER — Inpatient Hospital Stay (HOSPITAL_COMMUNITY): Payer: Medicare Other

## 2021-12-19 LAB — CBC
HCT: 31.8 % — ABNORMAL LOW (ref 36.0–46.0)
Hemoglobin: 11 g/dL — ABNORMAL LOW (ref 12.0–15.0)
MCH: 31 pg (ref 26.0–34.0)
MCHC: 34.6 g/dL (ref 30.0–36.0)
MCV: 89.6 fL (ref 80.0–100.0)
Platelets: 250 10*3/uL (ref 150–400)
RBC: 3.55 MIL/uL — ABNORMAL LOW (ref 3.87–5.11)
RDW: 13.4 % (ref 11.5–15.5)
WBC: 7.7 10*3/uL (ref 4.0–10.5)
nRBC: 0 % (ref 0.0–0.2)

## 2021-12-19 LAB — BASIC METABOLIC PANEL
Anion gap: 7 (ref 5–15)
BUN: 15 mg/dL (ref 8–23)
CO2: 29 mmol/L (ref 22–32)
Calcium: 9.2 mg/dL (ref 8.9–10.3)
Chloride: 97 mmol/L — ABNORMAL LOW (ref 98–111)
Creatinine, Ser: 0.74 mg/dL (ref 0.44–1.00)
GFR, Estimated: >60 mL/min (ref >60)
Glucose, Bld: 99 mg/dL (ref 70–99)
Potassium: 5.1 mmol/L (ref 3.5–5.1)
Sodium: 133 mmol/L — ABNORMAL LOW (ref 135–145)

## 2021-12-19 MED ORDER — ACETAMINOPHEN 325 MG PO TABS: 650.0000 mg | ORAL_TABLET | ORAL | Status: DC | PRN

## 2021-12-19 MED ORDER — ACETAMINOPHEN 325 MG PO TABS
650.0000 mg | ORAL_TABLET | Freq: Four times a day (QID) | ORAL | Status: DC | PRN
  Administered 2021-12-19 – 2021-12-20 (×2): 650 mg via ORAL
  Filled 2021-12-19 (×2): qty 2

## 2021-12-19 NOTE — Progress Notes (Signed)
Mobility Specialist Progress Note    12/19/21 1357  Mobility  Activity Ambulated with assistance in hallway  Level of Assistance Standby assist, set-up cues, supervision of patient - no hands on  Assistive Device Other (Comment) (hallway railings)  Distance Ambulated (ft) 420 ft  Activity Response Tolerated well  Mobility Referral Yes  $Mobility charge 1 Mobility   Pre-Mobility: 80 HR, 96% SpO2 on 3LO2 During Mobility: 111 HR, 89-92% SpO2 on 3LO2 Post-Mobility: 78 HR, 96% SpO2 on 3LO2  Pt received in bed and agreeable. Attempted to wean O2 at request of RN. Pt required 3LO2 to maintain SpO2 89-92% during. Upon return to bed stated she was a little winded. Left with call bell in reach.   Hildred Alamin Mobility Specialist  Secure Chat Only

## 2021-12-19 NOTE — Progress Notes (Addendum)
      [name]       [name] [address]             [phone]       3 Days Post-Op Procedure(s) (LRB): VIDEO BRONCHOSCOPY (N/A)  Subjective: Patient just finished using the bathroom. She walked several times yesterday  Objective: Vital signs in last 24 hours: Temp:  [98 F (36.7 C)-98.6 F (37 C)] 98 F (36.7 C) (10/19 0345) Pulse Rate:  [63-82] 63 (10/19 0345) Cardiac Rhythm: Normal sinus rhythm (10/18 1955) Resp:  [15-25] 15 (10/19 0345) BP: (99-141)/(72-84) 140/75 (10/19 0345) SpO2:  [93 %-98 %] 97 % (10/19 0345)     Intake/Output from previous day: 10/18 0701 - 10/19 0700 In: 323.6 [I.V.:109; IV Piggyback:214.6] Out: 2250 [Urine:2250]   Physical Exam:  Cardiovascular: RRR Pulmonary: Clear on the right and diminished on left Abdomen: Soft, non tender, bowel sounds present. Extremities: No lower extremity edema. Wounds: Clean and dry.  No erythema or signs of infection.   Lab Results: CBC: Recent Labs    12/19/21 0019  WBC 7.7  HGB 11.0*  HCT 31.8*  PLT 250    BMET:  Recent Labs    12/19/21 0019  NA 133*  K 5.1  CL 97*  CO2 29  GLUCOSE 99  BUN 15  CREATININE 0.74  CALCIUM 9.2     PT/INR: No results for input(s): "LABPROT", "INR" in the last 72 hours. ABG:  INR: Will add last result for INR, ABG once components are confirmed Will add last 4 CBG results once components are confirmed  Assessment/Plan:  1. CV - SR. 2.  Pulmonary - S/p left thoracentesis on 10/14 (400 ml removed). S/p video bronchoscopy for mucous plugging. On 4 liters of oxygen via [place].  CXR this am appears stable (left dense consolidation, small left apical space with fluid). Encourage incentive spirometer and flutter valve. Continue Mucomyst, Breo Ellipta, Duo Neb PRN 3. Mild expected blood loss anemia-H and H this am stable at 11 and 31.8 4. ID-on Cefepime (started 10/15). WBC this am 7700 and remains afebrile. Gram stain show few gram positive  cocci, rare gram negative rods. Culture re incubated for growth 5. On Lovenox for DVT prophylaxis 6. Regarding intercostal neuralgia, on MS PRN, Oxy PRN, Lyrica 150 mg bid (history of fibromyalgia and post herpetic neuralgia).   [name] M ZimmermanPA-C 12/19/2021,7:12 AM  Patient seen and examined, agree with above Looks great Pain is better She is breathing comfortably on RA but no sat recorded on RA She has started to cough up some mucous- hopefully swelling is decreasing and will be able to clear secretions.  CXR stable to slightly improved (will lag clinical improvement) Continue ambulation, IS, flutter, nebs Continue Maxipime for now- afebrile and normal WBC  [name] Solorio C. Roxan Hockey, MD [facility] [phone]

## 2021-12-19 NOTE — Progress Notes (Signed)
PT Cancellation Note  Patient Details Name: [name] [name] MRN: [id number] DOB: 09/14/1950   Cancelled Treatment:    Reason Eval/Treat Not Completed: PT screened, no needs identified, will sign off (pt walking independently on unit on RA and denies need for therapy services. Will sign off)   Kayson Bullis B Kendra Woolford 12/19/2021, 7:50 AM Bayard Males, PT Acute Rehabilitation Services Office: [phone]

## 2021-12-19 NOTE — TOC Initial Note (Addendum)
Transition of Care [facility]) - Initial/Assessment Note    Patient Details  Name: [name] [name] MRN: [id number] Date of Birth: [birth year]  Transition of Care [facility]) CM/SW Contact:    Verdell Carmine, RN Phone Number: 12/19/2021, 3:31 PM  Clinical Narrative:                  Discussed with nursing. Patient presented ith [facility]. Post op day 3 video bronchoscopy. Post thoracentesis. Oxygen ambulatory sarurations done and reviewed. Currently qualifies for home oxygen Called patient and family answered. Explained role and call for DC planning. Patient in bathroom, will call again shortly to discuss needs TOC will follow for needs, recommendations, and transitions if care. 1600 patient called for follow up regarding oxygrn needs and general discharge planning. She understands ixygen set up and does not feel she needs home health. She has her children and grandchildren for support. Oxygen via [employer].  Expected Discharge Plan: Home/Self Care Barriers to Discharge: Continued Medical Work up   Patient Goals and CMS Choice        Expected Discharge Plan and Services Expected Discharge Plan: Home/Self Care   Discharge Planning Services: CM Consult   Living arrangements for the past 2 months: Single Family Home                                      Prior Living Arrangements/Services Living arrangements for the past 2 months: Single Family Home Lives with:: Self Patient language and need for interpreter reviewed:: Yes        Need for Family Participation in Patient Care: Yes (Comment) Care giver support system in place?: Yes (comment)   Criminal Activity/Legal Involvement Pertinent to Current Situation/Hospitalization: No - Comment as needed  Activities of Daily Living   ADL Screening (condition at time of admission) Is the patient deaf or have difficulty hearing?: No Does the patient have difficulty seeing, even when wearing glasses/contacts?: No Does the patient have  difficulty dressing or bathing?: No  Permission Sought/Granted                  Emotional Assessment     Affect (typically observed): Accepting Orientation: : Oriented to Self, Oriented to Place, Oriented to  Time, Oriented to Situation Alcohol / Substance Use: Not Applicable    Admission diagnosis:  S/P lobectomy of lung [Z90.2] Patient Active Problem List   Diagnosis Date Noted   S/P lobectomy of lung 12/12/2021   Lung cancer ([place]) 12/05/2021   Acute pain of right shoulder 08/15/2021   Elevated glucose level 01/02/2019   Elevated TSH 11/30/2018   Grief reaction 11/24/2017   Multiple joint pain 09/16/2016   Osteopenia 06/15/2016   Screening mammogram, encounter for 04/28/2016   Estrogen deficiency 04/28/2016   Medicare annual wellness visit, initial 01/02/2015   Colon cancer screening 05/31/2013   Hyperkalemia 11/30/2012   Retinopathy, bilateral 10/05/2012   Hyperlipidemia, mild 10/05/2012   Adverse effects of medication 03/24/2012   Routine general medical examination at a health care facility 01/22/2011   Generalized osteoarthritis of hand 07/06/2009   SEBACEOUS CYST, NECK 07/13/2007   SLEEP DISORDER 01/18/2007   POSTHERPETIC NEURALGIA 12/15/2006   TOBACCO ABUSE 12/15/2006   CARPAL TUNNEL SYNDROME 12/15/2006   Osteoarthritis 12/15/2006   Fibromyalgia 12/15/2006   PCP:  Abner Greenspan, MD Pharmacy:   [facility], [place], [address]  [place], [place] [address] Phone: [phone] Fax: [phone]  [id number] [place], [place] [address] Phone: [phone] Fax: [phone]     Social Determinants of Health (SDOH) Interventions    Readmission Risk Interventions     No data to display

## 2021-12-19 NOTE — Progress Notes (Signed)
Nurse requested Mobility Specialist to perform oxygen saturation test with pt which includes removing pt from oxygen both at rest and while ambulating.  Below are the results from that testing.     Patient Saturations on Room Air at Rest = spO2 88%  Patient Saturations on Room Air while Ambulating = sp02 84% .    Patient Saturations on 3 Liters of oxygen while Ambulating = sp02 89-92%  At end of testing pt left in room on 3 Liters of oxygen.  Reported results to nurse.

## 2021-12-20 ENCOUNTER — Inpatient Hospital Stay (HOSPITAL_COMMUNITY): Payer: Medicare Other

## 2021-12-20 NOTE — Progress Notes (Signed)
Mobility Specialist Progress Note    12/20/21 1532  Mobility  Activity Ambulated with assistance in hallway  Level of Assistance Contact guard assist, steadying assist  Assistive Device Other (Comment) (hallway rails)  Distance Ambulated (ft) 420 ft  Activity Response Tolerated well  Mobility Referral Yes  $Mobility charge 1 Mobility   Pre-Mobility: 92 HR, 92% SpO2 During Mobility: 88-90% SpO2 on 2LO2 Post-Mobility: 100 HR  Pt received in bed and agreeable. No complaints. Encouraged pursed lip breathing. SpO2 88-90% on 2LO2. Returned to bed with call bell in reach and family present.  Hildred Alamin Mobility Specialist  Secure Chat Only

## 2021-12-20 NOTE — Plan of Care (Signed)

## 2021-12-20 NOTE — Progress Notes (Signed)
      [address]       [place] [address]             [phone]       4 Days Post-Op Procedure(s) (LRB): VIDEO BRONCHOSCOPY (N/A)  Subjective: Patient is able to expectorate mucous. She walks many times daily.   Objective: Vital signs in last 24 hours: Temp:  [97.6 F (36.4 C)-98.8 F (37.1 C)] 97.9 F (36.6 C) (10/20 0713) Pulse Rate:  [66-85] 66 (10/20 0713) Cardiac Rhythm: Normal sinus rhythm (10/20 0719) Resp:  [15-25] 18 (10/20 0713) BP: (106-136)/(64-82) 106/64 (10/20 0328) SpO2:  [95 %-98 %] 96 % (10/20 0713)     Intake/Output from previous day: 10/19 0701 - 10/20 0700 In: 625.4 [P.O.:240; IV Piggyback:385.4] Out: -    Physical Exam:  Cardiovascular: RRR Pulmonary: Clear on the right and diminished on left Abdomen: Soft, non tender, bowel sounds present. Extremities: No lower extremity edema. Wounds: Clean and dry.  No erythema or signs of infection.   Lab Results: CBC: Recent Labs    12/19/21 0019  WBC 7.7  HGB 11.0*  HCT 31.8*  PLT 250    BMET:  Recent Labs    12/19/21 0019  NA 133*  K 5.1  CL 97*  CO2 29  GLUCOSE 99  BUN 15  CREATININE 0.74  CALCIUM 9.2     PT/INR: No results for input(s): "LABPROT", "INR" in the last 72 hours. ABG:  INR: Will add last result for INR, ABG once components are confirmed Will add last 4 CBG results once components are confirmed  Assessment/Plan:  1. CV - SR. 2.  Pulmonary - S/p left thoracentesis on 10/14 (400 ml removed). S/p video bronchoscopy 10/16 for mucous plugging. On 3 liters of oxygen via [place].  Will need oxygen at discharge. CXR this am appears improved (left dense consolidation, small left apical space with fluid). Encourage incentive spirometer and flutter valve. Continue Mucomyst, Breo Ellipta, Duo Neb PRN 3. Mild expected blood loss anemia-H and H this am stable at 11 and 31.8 4. ID-on Cefepime (started 10/15). WBC this am 7700 and remains afebrile. Gram stain show  few gram positive cocci, rare gram negative rods. Culture re incubated for growth 5. On Lovenox for DVT prophylaxis 6. Once oxygen requirements 2 L or less, will discharge   Hassani Sliney M ZimmermanPA-C 12/20/2021,7:37 AM

## 2021-12-20 NOTE — Progress Notes (Signed)
Nurse requested Mobility Specialist to perform oxygen saturation test with pt which includes removing pt from oxygen both at rest and while ambulating.  Below are the results from that testing.     Patient Saturations on Room Air at Rest = spO2 92%  Patient Saturations on Room Air while Ambulating = sp02 85% .    Patient Saturations on 2 Liters of oxygen while Ambulating = sp02 88-90%  At end of testing pt left in room on 2 Liters of oxygen.  Reported results to nurse.

## 2021-12-20 NOTE — Progress Notes (Signed)
Mobility Specialist Progress Note    12/20/21 0946  Mobility  Activity Ambulated with assistance in hallway  Level of Assistance Contact guard assist, steadying assist  Assistive Device Other (Comment) (hallway rails)  Distance Ambulated (ft) 420 ft  Activity Response Tolerated fair  Mobility Referral Yes  $Mobility charge 1 Mobility   During Mobility: 97 HR Post-Mobility: 78 HR  Pt received and agreeable. No complaints. Encouraged pursed lip breathing. Pt on RA and SOB with exertion. Returned to sitting EOB with call bell in reach.   Hildred Alamin Mobility Specialist  Secure Chat Only

## 2021-12-21 ENCOUNTER — Inpatient Hospital Stay (HOSPITAL_COMMUNITY): Payer: Medicare Other

## 2021-12-21 MED ORDER — OXYCODONE HCL 5 MG PO TABS: 5.0000 mg | ORAL_TABLET | Freq: Four times a day (QID) | ORAL | 0 refills | Status: AC | PRN

## 2021-12-21 NOTE — Progress Notes (Signed)
[address]       [place] [address]             [phone]      5 Days Post-Op Procedure(s) (LRB): VIDEO BRONCHOSCOPY (N/A) Subjective: Looks and feels well, very anxious to get discharged  Objective: Vital signs in last 24 hours: Temp:  [97.9 F (36.6 C)-98.5 F (36.9 C)] 98 F (36.7 C) (10/21 0313) Pulse Rate:  [73-83] 74 (10/21 0313) Cardiac Rhythm: Sinus tachycardia (10/20 1900) Resp:  [18-20] 20 (10/21 0313) BP: (121-136)/(52-75) 121/52 (10/21 0313) SpO2:  [93 %-98 %] 98 % (10/21 0313)  Hemodynamic parameters for last 24 hours:    Intake/Output from previous day: 10/20 0701 - 10/21 0700 In: 480 [P.O.:480] Out: -  Intake/Output this shift: No intake/output data recorded.  General appearance: alert, cooperative, and no distress Heart: regular rate and rhythm Lungs: Diminished on the left lower fields, right is clear Abdomen: Benign exam Extremities: No edema or calf tenderness Wound: Incisions healing well without evidence of infection  Lab Results: Recent Labs    12/19/21 0019  WBC 7.7  HGB 11.0*  HCT 31.8*  PLT 250   BMET:  Recent Labs    12/19/21 0019  NA 133*  K 5.1  CL 97*  CO2 29  GLUCOSE 99  BUN 15  CREATININE 0.74  CALCIUM 9.2    PT/INR: No results for input(s): "LABPROT", "INR" in the last 72 hours. ABG    Component Value Date/Time   PHART 7.46 (H) 12/10/2021 1435   HCO3 26.3 12/10/2021 1435   O2SAT 96.7 12/10/2021 1435   CBG (last 3)  No results for input(s): "GLUCAP" in the last 72 hours.  Meds Scheduled Meds:  aspirin EC  81 mg Oral Daily   bisacodyl  10 mg Oral Daily   busPIRone  15 mg Oral Daily   DULoxetine  60 mg Oral Daily   enoxaparin (LOVENOX) injection  40 mg Subcutaneous Daily   fluticasone furoate-vilanterol  1 puff Inhalation Daily   And   umeclidinium bromide  1 puff Inhalation Daily   furosemide  20 mg Intravenous Daily   melatonin  20 mg Oral QHS   pantoprazole  40 mg Oral  Daily   polyethylene glycol  17 g Oral Daily   pregabalin  150 mg Oral BID   senna-docusate  1 tablet Oral QHS   sorbitol  30 mL Oral Once   Continuous Infusions:  sodium chloride 10 mL/hr at 12/18/21 0506   ceFEPime (MAXIPIME) IV 2 g (12/21/21 0603)   PRN Meds:.acetaminophen, ipratropium-albuterol, morphine injection, ondansetron (ZOFRAN) IV, oxyCODONE  Xrays DG Chest 2 View  Result Date: 12/20/2021 CLINICAL DATA:  [id number] with left pleural effusion. EXAM: CHEST - 2 VIEW COMPARISON:  PA LatYesterday at 4:39 a.m. FINDINGS: PA Lat at 4:54 a.m. There is again noted left hydropneumothorax with the hydro component predominating and opacifying the mid to lower left chest, with a small apical pneumo component above the level of the clavicle estimated no more than 5%. Partial aeration of left lung apex at the level of the aortic arch. This is also similar to yesterday's exam. Right lung remains clear of focal opacity. The heart silhouette largely obscured on the left, with left-sided volume loss and mediastinal shift. No vascular congestion is seen. Stable mediastinal configuration, as visualized. Stable bony thorax. IMPRESSION: Unchanged left hydropneumothorax with hydro component predominating. Electronically Signed   By: Telford Nab M.D.   On: 12/20/2021  05:53    Assessment/Plan: S/P Procedure(s) (LRB): VIDEO BRONCHOSCOPY (N/A) POD#5  1 afebrile, vital signs stable, sinus rhythm/sinus tachycardia 2 oxygen saturations are good on 2 L nasal cannula 3 voiding well, unmeasured 4 no new labs 5 chest x-ray appearance is fairly stable in terms of hydropneumothorax 6 he is stable for discharge on home O2 encouraged to continue aggressive pulmonary hygiene with her incentive spirometer and flutter valve  LOS: 9 days    [name] [name] 12/21/2021

## 2021-12-21 NOTE — TOC Transition Note (Signed)
Transition of Care [facility]) - CM/SW Discharge Note   Patient Details  Name: [name] [name] MRN: [id number] Date of Birth: 10-18-1950  [facility]) CM/SW Contact:  Konrad Penta, RN Phone Number: [phone] 12/21/2021, 9:17 AM   Clinical Narrative:   Spoke with [name] at bedside prior to discharge. States she lives alone but family will stay with her when returns home. Oxygen has been delivered to room. [name] feels comfortable with wearing oxygen during ambulation and exertion. Currently on room air. Reports she has been up walking and independent in room. Denies having any home health needs. Family will transport home today.  Spoke with nursing to confirm no further needs. Please re-consult TOC if needed.     Final next level of care: Home/Self Care Barriers to Discharge: No Barriers Identified   Patient Goals and CMS Choice Patient states their goals for this hospitalization and ongoing recovery are:: ready to get home   Choice offered to / list presented to : Patient  Discharge Placement                       Discharge Plan and Services   Discharge Planning Services: CM Consult            DME Arranged: Oxygen DME Agency: [employer], [place] Date DME Agency Contacted: 12/19/21   Representative spoke with at DME Agency: Melene Muller            Social Determinants of Health (SDOH) Interventions     Readmission Risk Interventions     No data to display

## 2021-12-21 NOTE — Plan of Care (Signed)
Problem: Education: Goal: Knowledge of disease or condition will improve 12/21/2021 0342 by Arlyce Harman, RN Outcome: Progressing 12/20/2021 2150 by Arlyce Harman, RN Outcome: Progressing Goal: Knowledge of the prescribed therapeutic regimen will improve 12/21/2021 0342 by Arlyce Harman, RN Outcome: Progressing 12/20/2021 2150 by Arlyce Harman, RN Outcome: Progressing   Problem: Activity: Goal: Risk for activity intolerance will decrease 12/21/2021 0342 by Arlyce Harman, RN Outcome: Progressing 12/20/2021 2150 by Arlyce Harman, RN Outcome: Progressing   Problem: Cardiac: Goal: Will achieve and/or maintain hemodynamic stability 12/21/2021 0342 by Arlyce Harman, RN Outcome: Progressing 12/20/2021 2150 by Arlyce Harman, RN Outcome: Progressing   Problem: Clinical Measurements: Goal: Postoperative complications will be avoided or minimized 12/21/2021 0342 by Arlyce Harman, RN Outcome: Progressing 12/20/2021 2150 by Arlyce Harman, RN Outcome: Progressing   Problem: Respiratory: Goal: Respiratory status will improve 12/21/2021 0342 by Arlyce Harman, RN Outcome: Progressing 12/20/2021 2150 by Arlyce Harman, RN Outcome: Progressing   Problem: Pain Management: Goal: Pain level will decrease 12/21/2021 0342 by Arlyce Harman, RN Outcome: Progressing 12/20/2021 2150 by Arlyce Harman, RN Outcome: Progressing   Problem: Skin Integrity: Goal: Wound healing without signs and symptoms infection will improve 12/21/2021 0342 by Arlyce Harman, RN Outcome: Progressing 12/20/2021 2150 by Arlyce Harman, RN Outcome: Progressing   Problem: Education: Goal: Knowledge of General Education information will improve Description: Including pain rating scale, medication(s)/side effects and non-pharmacologic comfort measures 12/21/2021 0342 by Arlyce Harman, RN Outcome: Progressing 12/20/2021 2150 by Arlyce Harman,  RN Outcome: Progressing   Problem: Health Behavior/Discharge Planning: Goal: Ability to manage health-related needs will improve 12/21/2021 0342 by Arlyce Harman, RN Outcome: Progressing 12/20/2021 2150 by Arlyce Harman, RN Outcome: Progressing   Problem: Clinical Measurements: Goal: Ability to maintain clinical measurements within normal limits will improve 12/21/2021 0342 by Arlyce Harman, RN Outcome: Progressing 12/20/2021 2150 by Arlyce Harman, RN Outcome: Progressing Goal: Will remain free from infection 12/21/2021 0342 by Arlyce Harman, RN Outcome: Progressing 12/20/2021 2150 by Arlyce Harman, RN Outcome: Progressing Goal: Diagnostic test results will improve 12/21/2021 0342 by Arlyce Harman, RN Outcome: Progressing 12/20/2021 2150 by Arlyce Harman, RN Outcome: Progressing Goal: Respiratory complications will improve 12/21/2021 0342 by Arlyce Harman, RN Outcome: Progressing 12/20/2021 2150 by Arlyce Harman, RN Outcome: Progressing Goal: Cardiovascular complication will be avoided 12/21/2021 0342 by Arlyce Harman, RN Outcome: Progressing 12/20/2021 2150 by Arlyce Harman, RN Outcome: Progressing   Problem: Activity: Goal: Risk for activity intolerance will decrease 12/21/2021 0342 by Arlyce Harman, RN Outcome: Progressing 12/20/2021 2150 by Arlyce Harman, RN Outcome: Progressing   Problem: Nutrition: Goal: Adequate nutrition will be maintained 12/21/2021 0342 by Arlyce Harman, RN Outcome: Progressing 12/20/2021 2150 by Arlyce Harman, RN Outcome: Progressing   Problem: Coping: Goal: Level of anxiety will decrease 12/21/2021 0342 by Arlyce Harman, RN Outcome: Progressing 12/20/2021 2150 by Arlyce Harman, RN Outcome: Progressing   Problem: Elimination: Goal: Will not experience complications related to bowel motility 12/21/2021 0342 by Arlyce Harman, RN Outcome:  Progressing 12/20/2021 2150 by Arlyce Harman, RN Outcome: Progressing Goal: Will not experience complications related to urinary retention 12/21/2021 0342 by Arlyce Harman, RN Outcome: Progressing 12/20/2021 2150 by Arlyce Harman, RN Outcome: Progressing   Problem: Pain Managment: Goal: General experience of comfort will improve 12/21/2021 0342 by Arlyce Harman, RN Outcome: Progressing  12/20/2021 2150 by Arlyce Harman, RN Outcome: Progressing   Problem: Safety: Goal: Ability to remain free from injury will improve 12/21/2021 0342 by Arlyce Harman, RN Outcome: Progressing 12/20/2021 2150 by Arlyce Harman, RN Outcome: Progressing   Problem: Skin Integrity: Goal: Risk for impaired skin integrity will decrease 12/21/2021 0342 by Arlyce Harman, RN Outcome: Progressing 12/20/2021 2150 by Arlyce Harman, RN Outcome: Progressing

## 2021-12-23 ENCOUNTER — Other Ambulatory Visit: Payer: Self-pay | Admitting: Thoracic Surgery (Cardiothoracic Vascular Surgery)

## 2021-12-23 DIAGNOSIS — C3490 Malignant neoplasm of unspecified part of unspecified bronchus or lung: Secondary | ICD-10-CM

## 2021-12-24 ENCOUNTER — Telehealth: Payer: Self-pay

## 2021-12-24 ENCOUNTER — Ambulatory Visit (INDEPENDENT_AMBULATORY_CARE_PROVIDER_SITE_OTHER): Payer: Self-pay | Admitting: Thoracic Surgery (Cardiothoracic Vascular Surgery)

## 2021-12-24 ENCOUNTER — Ambulatory Visit
Admission: RE | Admit: 2021-12-24 | Discharge: 2021-12-24 | Disposition: A | Discharge status: Home / Self Care | Payer: Medicare Other | Source: Ambulatory Visit | Attending: Thoracic Surgery (Cardiothoracic Vascular Surgery) | Admitting: Thoracic Surgery (Cardiothoracic Vascular Surgery)

## 2021-12-24 ENCOUNTER — Telehealth: Payer: Self-pay | Admitting: *Deleted

## 2021-12-24 VITALS — BP 158/74 | HR 70 | Resp 20 | Ht 64.0 in | Wt 170.0 lb

## 2021-12-24 DIAGNOSIS — C3412 Malignant neoplasm of upper lobe, left bronchus or lung: Secondary | ICD-10-CM

## 2021-12-24 DIAGNOSIS — J90 Pleural effusion, not elsewhere classified: Secondary | ICD-10-CM | POA: Diagnosis not present

## 2021-12-24 DIAGNOSIS — C3490 Malignant neoplasm of unspecified part of unspecified bronchus or lung: Secondary | ICD-10-CM

## 2021-12-24 NOTE — Progress Notes (Signed)
[address]       [name] [address]             [phone]     HPI: [name] returns for a scheduled follow-up visit after recent lobectomy.  [name] [name] is a 71 year old woman with a history of tobacco abuse, fibromyalgia, hyperlipidemia, COPD, endometriosis, arthritis, postherpetic neuralgia, and depression.  She recent was found to have a left upper lobe lung nodule.  It was hypermetabolic on PET.  There also was a possible hypermetabolic node along the pulmonary artery.  Robotic bronchoscopy showed non-small cell carcinoma.  I did a robotic assisted left upper lobectomy and node dissection on 12/12/2021.  Postoperative course was complicated by mucous plugging and atelectasis requiring bronchoscopy.  She went home on postoperative day #8.  Pathology showed a T1, N1, stage IIb squamous cell carcinoma.  She does have some incisional pain.  She also feels a sensation of tightness in that left chest with certain movements.  Taking oxycodone about every 4 hours.  She is on a stable chronic dose of Lyrica.  She has not been using acetaminophen.  No shortness of breath or swelling in her legs.  Past Medical History:  Diagnosis Date   Arthritis    OA   Carpal tunnel syndrome    Depression    Endometriosis    Fibromyalgia    Hyperlipidemia    Left upper lobe pulmonary nodule 11/2021   Post herpetic neuralgia    Sleep disorder    Tobacco abuse     Current Outpatient Medications  Medication Sig Dispense Refill   aspirin 81 MG tablet Take 81 mg by mouth daily.       busPIRone (BUSPAR) 15 MG tablet TAKE 1/2 TABLET BY MOUTH TWICE A DAY (Patient taking differently: Take 15 mg by mouth daily.) 90 tablet 2   DULoxetine (CYMBALTA) 60 MG capsule TAKE 1 CAPSULE BY MOUTH EVERY DAY 90 capsule 2   Fluticasone-Umeclidin-Vilant (TRELEGY ELLIPTA) 100-62.5-25 MCG/ACT AEPB Inhale 1 puff into the lungs daily. (Patient taking differently: Inhale 1 puff into the lungs daily  as needed (shortness of breath).) 28 each 11   Melatonin 10 MG CAPS Take 20 mg by mouth at bedtime.     naproxen sodium (ALEVE) 220 MG tablet Take 440 mg by mouth daily as needed (headaches).     oxyCODONE (OXY IR/ROXICODONE) 5 MG immediate release tablet Take 1 tablet (5 mg total) by mouth every 6 (six) hours as needed for up to 7 days for moderate pain. 28 tablet 0   pregabalin (LYRICA) 75 MG capsule TAKE 1 CAPSULE BY MOUTH TWICE A DAY 180 capsule 1   No current facility-administered medications for this visit.    Physical Exam BP (!) 158/74   Pulse 70   Resp 20   Ht 5\' 4"  (1.626 m)   Wt 170 lb (77.1 kg)   SpO2 95% Comment: RA  BMI 29.19 kg/m  71 year old woman in no acute distress Alert and oriented x3 with no focal deficits Lungs absent at left base otherwise clear Incisions clean dry and intact No peripheral edema Cardiac regular rate and rhythm  Diagnostic Tests: CHEST - 2 VIEW   COMPARISON:  12/21/2021.   FINDINGS: Decreased left pleural effusion with increased expansion of the left lung since the most recent prior study. No pneumothorax. Right lung is clear.   IMPRESSION: 1. Interval improvement in lung aeration. Specifically, re-expansion of much the residual left lung. Has been  a decrease in the left pleural effusion, which till obscures the left heart border and left hemidiaphragm. 2. No evidence of pneumonia or pulmonary edema.  No pneumothorax.     Electronically Signed   By: Lajean Manes M.D.   On: 12/24/2021 08:14 I personally reviewed the chest x-ray images.  Shows improved aeration.  Impression: [name] [name] is a 71 year old woman with a history of tobacco abuse, fibromyalgia, hyperlipidemia, COPD, endometriosis, arthritis, postherpetic neuralgia, and depression.  Incidentally found to have a lung nodule on a chest x-ray of her shoulder.  Stage IIb (T1, N1) squamous cell carcinoma-status post left upper lobectomy and node dissection.  Will  benefit from adjuvant chemotherapy.  We will make referral to oncology at [facility].  Status post left upper lobectomy-complicated by mucous plugging and atelectasis.  Required bronchoscopy.  Now has markedly improved aeration.  Still some elevation of left hemidiaphragm and a small effusion.  Hopefully those will resolve over time.  Postoperative pain-as expected.  I advised her to take 1000 mg of acetaminophen 3 times a day on a standing basis continue Lyrica.  She can continue to use oxycodone as needed we want to start weaning that back soon as we can.  She will call if she needs a refill on her prescription.  Should not drive until she is no longer having to take oxycodone during the day.  Other than that no restrictions on her activities.  Plan: Follow-up with Dr. Patsey Berthold Refer to oncology at [facility] for adjuvant chemotherapy Return in 3 weeks with PA lateral chest x-ray to check on progress  [name], MD [facility] [phone]

## 2021-12-24 NOTE — Telephone Encounter (Signed)
-----   Message from Tyler Pita, MD sent at 12/24/2021  9:51 AM EDT ----- She will need follow-up with me in 2 to 3 months. ----- Message ----- From: [name], MD Sent: 12/24/2021   9:43 AM EDT To: Abner Greenspan, MD; Tyler Pita, MD

## 2021-12-24 NOTE — Telephone Encounter (Signed)
Lm x1 for patient to schedule.

## 2021-12-24 NOTE — Telephone Encounter (Signed)
Noted  

## 2021-12-24 NOTE — Patient Outreach (Signed)
  Care Coordination [facility] Note Transition Care Management Follow-up Telephone Call Date of discharge and from where: [facility] [phone] How have you been since you were released from the hospital?  I feel pretty good Any questions or concerns? No  Items Reviewed: Did the pt receive and understand the discharge instructions provided? Yes  Medications obtained and verified? Yes  Other? No  Any new allergies since your discharge? No  Dietary orders reviewed? No Do you have support at home? Yes   Home Care and Equipment/Supplies: Were home health services ordered? no If so, what is the name of the agency? N/a  Has the agency set up a time to come to the patient's home? no Were any new equipment or medical supplies ordered?  Yes: Oxygen 2 L n/c What is the name of the medical supply agency? [place] [facility] Were you able to get the supplies/equipment? yes Do you have any questions related to the use of the equipment or supplies? No  Functional Questionnaire: (I = Independent and D = Dependent) ADLs: I  Bathing/Dressing- I  Meal Prep- I  Eating- I  Maintaining continence- I  Transferring/Ambulation- I  Managing Meds- I  Follow up appointments reviewed:  PCP Hospital f/u appt confirmed? No   Specialist Hospital f/u appt confirmed? Yes  Dr Modesto Charon [id number] 9:00 Chest xray [id number] 8:00 Are transportation arrangements needed? No  If their condition worsens, is the pt aware to call PCP or go to the Emergency Dept.? Yes Was the patient provided with contact information for the PCP's office or ED? Yes Was to pt encouraged to call back with questions or concerns? Yes  SDOH assessments and interventions completed:   Yes  Care Coordination Interventions Activated:  Yes   Care Coordination Interventions:  No Care Coordination interventions needed at this time.   Encounter Outcome:  Pt. Visit Completed    [facility] [phone]

## 2021-12-26 ENCOUNTER — Other Ambulatory Visit: Payer: Self-pay

## 2021-12-26 ENCOUNTER — Other Ambulatory Visit: Payer: Self-pay | Admitting: *Deleted

## 2021-12-26 NOTE — Progress Notes (Signed)
The proposed treatment discussed in conference is for discussion purpose only and is not a binding recommendation.  The patients have not been physically examined, or presented with their treatment options.  Therefore, final treatment plans cannot be decided.  

## 2021-12-27 ENCOUNTER — Encounter: Payer: Self-pay | Admitting: *Deleted

## 2021-12-27 NOTE — Progress Notes (Signed)
Referral received from Dr. Roxan Hockey. Appt scheduled and pt notified with appt details. Contact info given and instructed to call with any questions or needs prior to appt. Pt verbalized understanding.

## 2022-01-03 ENCOUNTER — Inpatient Hospital Stay: Payer: Medicare Other | Attending: Internal Medicine | Admitting: Internal Medicine

## 2022-01-03 ENCOUNTER — Encounter: Payer: Self-pay | Admitting: *Deleted

## 2022-01-03 ENCOUNTER — Inpatient Hospital Stay: Payer: Medicare Other

## 2022-01-03 ENCOUNTER — Encounter: Payer: Self-pay | Admitting: Internal Medicine

## 2022-01-03 VITALS — BP 140/74 | HR 71 | Temp 96.6°F | Resp 19 | Wt 169.9 lb

## 2022-01-03 DIAGNOSIS — Z902 Acquired absence of lung [part of]: Secondary | ICD-10-CM | POA: Diagnosis not present

## 2022-01-03 DIAGNOSIS — C3412 Malignant neoplasm of upper lobe, left bronchus or lung: Secondary | ICD-10-CM

## 2022-01-03 DIAGNOSIS — Z87891 Personal history of nicotine dependence: Secondary | ICD-10-CM | POA: Diagnosis not present

## 2022-01-03 NOTE — Progress Notes (Signed)
Patient has no concerns at the moment. 

## 2022-01-03 NOTE — Progress Notes (Signed)
Met with patient during initial consultation with Dr. Rogue Bussing. All questions answered during visit. Instructed pt to call once she has discussed MD recommendations with family and has made a decision regarding adjuvant chemo vs. active surveillance. Contact info given. Advised for pt to call if has any further questions or needs. Pt verbalized understanding. Nothing further needed at this time.

## 2022-01-03 NOTE — Assessment & Plan Note (Addendum)
#  Left upper lobe-lung cancer moderately differentiated squamous cell carcinoma-pT1pN1- [IIB]; PDL-1 =70%. Will check EGFR status-although low clinical possibility.  # I discussed the risk of recurrence-for stage II lung cancer-in the range of 30 to 40%;and the  benefit from adjuvant chemotherapy.  I discussed the goal of treatment would be cure.  Discussed that the absolute benefit from platinum based doublet chemotherapy is about 8 to 10%.  Based on PD-L1 status would also benefit from immunotherapy.     #Status adjuvant chemotherapy would include platinum [cisplatin or carboplatin] based on renal function in combination with Taxol.  Chemotherapy is recommended every 3 weeks x 4 cycles.    Discussed the potential side effects including but not limited to-increasing fatigue, nausea vomiting, diarrhea, hair loss, sores in the mouth, increase risk of infection and also neuropathy.   # I also discussed the role of immunotherapy for stage II and above resected lung cancer.  Patient's PDL is 70%-recommend Aztezoluzimab every 3 weeks for 16 cycles/ 1 year.    I discussed the mechanism of action. Discussed the potential side effects of immunotherapy including but not limited to diarrhea; skin rash; elevated LFTs/endocrine abnormalities etc.  #Discussed that patient will need surveillance imaging every 6 months or so; irrespective of patient's decision for adjuvant therapy.  #After lengthy discussion with the patient and daughter-they expressed interest in the treatment plan; however stated that they would need time to process information.  They will let us know of their decision/will call [facility].  They are aware that I will be out of town for the next few weeks; and in my absence there could possibly see other providers to have a discussion; and plan is interested proceed with treatment plan.  Discussed with [facility].   Thank you Dr. Blase Mess for allowing me to participate in the care of your  pleasant patient. Please do not hesitate to contact me with questions or concerns in the interim.   # DISPOSITION:  # Follow up TBD- Dr.B  # I reviewed the blood work- with the patient in detail; also reviewed the imaging independently [as summarized above]; and with the patient in detail.

## 2022-01-03 NOTE — Progress Notes (Signed)
[place] CONSULT NOTE  Patient Care Team: Tower, [name], MD as PCP - General Telford Nab, RN as Oncology Nurse Navigator  CHIEF COMPLAINTS/PURPOSE OF CONSULTATION: lung cancer  #  Oncology History Overview Note  JULY 2023- 1. 10 mm mean diameter nodule in the left upper lobe adjacent to an area of pleural and parenchymal scarring.     SEP 2023- 1. Hypermetabolic spiculated nodular left upper lobe pulmonary lesion is compatible with primary bronchogenic neoplasm. 2. Mildly metabolic nodularity in the left hilum, without discrete adenopathy may reflect early nodal disease involvement. Suggest attention on follow-up imaging. 3. No evidence of distant hypermetabolic metastatic disease. 4. Hypermetabolic nodule in the left lobe of the thyroid, Recommend thyroid US and biopsy (ref: J Am Coll Radiol. 2015 Feb;12(2): 143-50). 5. Aortic Atherosclerosis (ICD10-I70.0) and Emphysema (ICD10-J43.9).  # LUNG: Resection  Synchronous Tumors: Not applicable Total Number of Primary Tumors: 1 Procedure: Lobectomy, lung Specimen Laterality: Left Tumor Focality: Unifocal Tumor Site: Upper lobe Tumor Size: 2.6 cm Histologic Type: Squamous cell carcinoma, moderately differentiated Visceral Pleura Invasion: Not identified Direct Invasion of Adjacent Structures: No adjacent structures present Lymphovascular Invasion: Not identified Margins: All margins negative for invasive carcinoma      Closest Margin(s) to Invasive Carcinoma: Bronchovascular margin      Margin(s) Involved by Invasive Carcinoma: Not applicable       Margin Status for Non-Invasive Tumor: Negative Treatment Effect: No known presurgical therapy Regional Lymph Nodes:      Number of Lymph Nodes Involved: 1                           Nodal Sites with Tumor: Level 10      Number of Lymph Nodes Examined: 21                      Nodal Sites Examined: Levels 4, 5, 7, 9, 10, 11, 12 and 13 Distant Metastasis:       Distant Site(s) Involved: Not applicable Pathologic Stage Classification (pTNM, AJCC 8th Edition): pT1c, pN1 Ancillary Studies: Can be performed upon request Representative Tumor Block: B5 Comment(s): None    Lung cancer ([place])  12/05/2021 Initial Diagnosis   Lung cancer ([place])   12/18/2021 Cancer Staging   Staging form: Lung, AJCC 8th Edition - Pathologic stage from 12/18/2021: Stage IIB (pT1c, pN1, cM0) - Signed by [name], MD on 12/18/2021 Histopathologic type: Squamous cell carcinoma, NOS   Cancer of upper lobe of left lung ([place])  01/03/2022 Initial Diagnosis   Cancer of upper lobe of left lung ([place])   01/03/2022 Cancer Staging   Staging form: Lung, AJCC 8th Edition - Pathologic: Stage IIB (pT1c, pN1, cM0) - Signed by Cammie Sickle, MD on 01/03/2022    HISTORY OF PRESENTING ILLNESS: Alone.  Daughter over the phone.  [name] [name] 71 y.o.  female history of smoking is here for further evaluation and recommendations for lung cancer.  Patient had chest x-rays for right arm pain few months ago.  Which interestingly showed a lesion in the left upper lobe which led to further work-up including CT scan/PET scan biopsy.  Patient further underwent surgery.  She is here to review the results of the pathology and the treatment plan.    Review of Systems  Constitutional:  Negative for chills, diaphoresis, fever, malaise/fatigue and weight loss.  HENT:  Negative for nosebleeds and sore throat.   Eyes:  Negative  for double vision.  Respiratory:  Negative for cough, hemoptysis, sputum production, shortness of breath and wheezing.   Cardiovascular:  Negative for chest pain, palpitations, orthopnea and leg swelling.  Gastrointestinal:  Negative for abdominal pain, blood in stool, constipation, diarrhea, heartburn, melena, nausea and vomiting.  Genitourinary:  Negative for dysuria, frequency and urgency.  Musculoskeletal:  Negative for back pain and joint pain.  Skin:  Negative.  Negative for itching and rash.  Neurological:  Negative for dizziness, tingling, focal weakness, weakness and headaches.  Endo/Heme/Allergies:  Does not bruise/bleed easily.  Psychiatric/Behavioral:  Negative for depression. The patient is not nervous/anxious and does not have insomnia.      MEDICAL HISTORY:  Past Medical History:  Diagnosis Date   Arthritis    OA   Carpal tunnel syndrome    Depression    Endometriosis    Fibromyalgia    Hyperlipidemia    Left upper lobe pulmonary nodule 11/2021   Post herpetic neuralgia    Sleep disorder    Tobacco abuse     SURGICAL HISTORY: Past Surgical History:  Procedure Laterality Date   ABDOMINAL HYSTERECTOMY  1992   total endometriosis   APPENDECTOMY  1992   bladder tack  1992   CATARACT EXTRACTION W/PHACO Right 03/25/2018   Procedure: CATARACT EXTRACTION PHACO AND INTRAOCULAR LENS PLACEMENT ([place]) RIGHT;  Surgeon: Marchia Meiers, MD;  Location: [facility];  Service: Ophthalmology;  Laterality: Right;  [place]  01:11 CDE 8.02 Fluid pack lot # [id number]   CATARACT EXTRACTION W/PHACO Left 04/22/2018   Procedure: CATARACT EXTRACTION PHACO AND INTRAOCULAR LENS PLACEMENT ([place]) Left Eye;  Surgeon: Marchia Meiers, MD;  Location: [facility];  Service: Ophthalmology;  Laterality: Left;  [place]  01:02 CDE 8.11 Fluid pack lot # [id number]   COLONOSCOPY     FOOT SURGERY Right 04/2016   [place] great toe; straightening others   INTERCOSTAL NERVE BLOCK Left 12/12/2021   Procedure: INTERCOSTAL NERVE BLOCK;  Surgeon: [name], MD;  Location: [facility];  Service: Thoracic;  Laterality: Left;   LYMPH NODE DISSECTION Left 12/12/2021   Procedure: LYMPH NODE DISSECTION;  Surgeon: [name], MD;  Location: [facility];  Service: Thoracic;  Laterality: Left;   VIDEO BRONCHOSCOPY N/A 12/16/2021   Procedure: VIDEO BRONCHOSCOPY;  Surgeon: [name], MD;  Location: [facility];  Service: Thoracic;  Laterality: N/A;     SOCIAL HISTORY: Social History   Socioeconomic History   Marital status: Widowed    Spouse name: Not on file   Number of children: 2   Years of education: Not on file   Highest education level: Not on file  Occupational History   Not on file  Tobacco Use   Smoking status: Former    Packs/day: 1.00    Years: 55.00    Total pack years: 55.00    Types: Cigarettes    Quit date: 12/03/2021    Years since quitting: 0.0   Smokeless tobacco: Never  Vaping Use   Vaping Use: Some days  Substance and Sexual Activity   Alcohol use: No    Alcohol/week: 0.0 standard drinks of alcohol   Drug use: No   Sexual activity: Not Currently  Other Topics Concern   Not on file  Social History Narrative   Lives alone   Social Determinants of Health   Financial Resource Strain: Low Risk  (02/01/2021)   Overall Financial Resource Strain (CARDIA)    Difficulty of Paying Living Expenses: Not hard  at all  Food Insecurity: No Food Insecurity (02/01/2021)   Hunger Vital Sign    Worried About Running Out of Food in the Last Year: Never true    Ran Out of Food in the Last Year: Never true  Transportation Needs: No Transportation Needs (02/01/2021)   PRAPARE - Hydrologist (Medical): No    Lack of Transportation (Non-Medical): No  Physical Activity: Inactive (02/01/2021)   Exercise Vital Sign    Days of Exercise per Week: 0 days    Minutes of Exercise per Session: 0 min  Stress: No Stress Concern Present (02/01/2021)   [place]    Feeling of Stress : Not at all  Social Connections: [place] (02/01/2021)   Social Connection and Isolation Panel [NHANES]    Frequency of Communication with Friends and Family: More than three times a week    Frequency of Social Gatherings with Friends and Family: More than three times a week    Attends Religious Services: More than 4 times per year    Active  Member of [employer] or Organizations: Yes    Attends Music therapist: More than 4 times per year    Marital Status: Married  Human resources officer Violence: Not At Risk (02/01/2021)   Humiliation, Afraid, Rape, and Kick questionnaire    Fear of Current or Ex-Partner: No    Emotionally Abused: No    Physically Abused: No    Sexually Abused: No    FAMILY HISTORY: Family History  Problem Relation Age of Onset   Heart disease Mother        MI   Heart disease Father        MI   Arthritis Sister        crippling arthritis   Arthritis Brother        crippling arthritis    ALLERGIES:  has No Known Allergies.  MEDICATIONS:  Current Outpatient Medications  Medication Sig Dispense Refill   aspirin 81 MG tablet Take 81 mg by mouth daily.       busPIRone (BUSPAR) 15 MG tablet TAKE 1/2 TABLET BY MOUTH TWICE A DAY (Patient taking differently: Take 15 mg by mouth daily.) 90 tablet 2   DULoxetine (CYMBALTA) 60 MG capsule TAKE 1 CAPSULE BY MOUTH EVERY DAY 90 capsule 2   Fluticasone-Umeclidin-Vilant (TRELEGY ELLIPTA) 100-62.5-25 MCG/ACT AEPB Inhale 1 puff into the lungs daily. (Patient taking differently: Inhale 1 puff into the lungs daily as needed (shortness of breath).) 28 each 11   Melatonin 10 MG CAPS Take 20 mg by mouth at bedtime.     naproxen sodium (ALEVE) 220 MG tablet Take 440 mg by mouth daily as needed (headaches).     pregabalin (LYRICA) 75 MG capsule TAKE 1 CAPSULE BY MOUTH TWICE A DAY 180 capsule 1   No current facility-administered medications for this visit.      Marland Kitchen  PHYSICAL EXAMINATION: ECOG PERFORMANCE STATUS: 0 - Asymptomatic  Vitals:   01/03/22 0927  BP: (!) 140/74  Pulse: 71  Resp: 19  Temp: (!) 96.6 F (35.9 C)  SpO2: 97%   Filed Weights   01/03/22 0927  Weight: 169 lb 14.4 oz (77.1 kg)    Physical Exam Vitals and nursing note reviewed.  Constitutional:      Comments:     HENT:     Head: Normocephalic and atraumatic.     Mouth/Throat:      Mouth: Mucous  membranes are moist.     Pharynx: No oropharyngeal exudate.  Eyes:     Pupils: Pupils are equal, round, and reactive to light.  Cardiovascular:     Rate and Rhythm: Normal rate and regular rhythm.  Pulmonary:     Effort: No respiratory distress.     Breath sounds: No wheezing.     Comments: Decreased breath sounds bilaterally at bases.  No wheeze or crackles Abdominal:     General: Bowel sounds are normal. There is no distension.     Palpations: Abdomen is soft. There is no mass.     Tenderness: There is no abdominal tenderness. There is no guarding or rebound.  Musculoskeletal:        General: No tenderness. Normal range of motion.     Cervical back: Normal range of motion and neck supple.  Skin:    General: Skin is warm.  Neurological:     Mental Status: She is alert and oriented to person, place, and time.  Psychiatric:        Mood and Affect: Affect normal.        Judgment: Judgment normal.      LABORATORY DATA:  I have reviewed the data as listed Lab Results  Component Value Date   WBC 7.7 12/19/2021   HGB 11.0 (L) 12/19/2021   HCT 31.8 (L) 12/19/2021   MCV 89.6 12/19/2021   PLT 250 12/19/2021   Recent Labs    06/17/21 0904 12/10/21 1450 12/13/21 0102 12/14/21 0020 12/16/21 0018 12/19/21 0019  NA 136 135   < > 136 134* 133*  K 4.9 4.3   < > 5.1 4.5 5.1  CL 102 106   < > 103 102 97*  CO2 29 24   < > _0 GLUCOSE 95 78   < > 96 108* 99  BUN 10 10   < > _1 CREATININE 0.70 0.68   < > 0.99 0.63 0.74  CALCIUM 9.6 9.3   < > 9.0 8.9 9.2  GFRNONAA  --  >60   < > >60 >60 >60  PROT 6.7 6.5  --  5.6*  --   --   ALBUMIN 4.1 3.6  --  3.1*  --   --   AST 11 18  --  17  --   --   ALT 7 14  --  12  --   --   ALKPHOS 93 89  --  63  --   --   BILITOT 0.6 0.9  --  0.4  --   --    < > = values in this interval not displayed.    RADIOGRAPHIC STUDIES: I have personally reviewed the radiological images as listed and agreed with the findings in  the report. DG Chest 2 View  Result Date: 12/24/2021 CLINICAL DATA:  Status post left upper lobectomy.  Follow-up study. EXAM: CHEST - 2 VIEW COMPARISON:  12/21/2021. FINDINGS: Decreased left pleural effusion with increased expansion of the left lung since the most recent prior study. No pneumothorax. Right lung is clear. IMPRESSION: 1. Interval improvement in lung aeration. Specifically, re-expansion of much the residual left lung. Has been a decrease in the left pleural effusion, which till obscures the left heart border and left hemidiaphragm. 2. No evidence of pneumonia or pulmonary edema.  No pneumothorax. Electronically Signed   By: Lajean Manes M.D.   On: 12/24/2021 08:14   DG Chest 2 View  Result  Date: 12/21/2021 CLINICAL DATA:  [id number] with hydropneumothorax. EXAM: CHEST - 2 VIEW COMPARISON:  PA Lat 12/20/2021 at 4:54 a.m. FINDINGS: PA Lat 5:47 a.m. Left hydropneumothorax with sizable hydro component and minimal apical pneumo component is again noted. The pleural fluid appears to have increased since the degree of aeration in the left apex is less than yesterday. The minimal apical pneumo component is also slightly smaller. Right lung remains clear. Cardiomediastinal silhouette is stable with aortic atherosclerosis. Osteopenia. IMPRESSION: Increased left pleural fluid, with decreased small apical pneumo component. Attendant decreased aeration in the left apical lung. Rest of the mid to lower left chest is opacified. Electronically Signed   By: Telford Nab M.D.   On: 12/21/2021 07:47   DG Chest 2 View  Result Date: 12/20/2021 CLINICAL DATA:  [id number] with left pleural effusion. EXAM: CHEST - 2 VIEW COMPARISON:  PA LatYesterday at 4:39 a.m. FINDINGS: PA Lat at 4:54 a.m. There is again noted left hydropneumothorax with the hydro component predominating and opacifying the mid to lower left chest, with a small apical pneumo component above the level of the clavicle estimated no more than 5%.  Partial aeration of left lung apex at the level of the aortic arch. This is also similar to yesterday's exam. Right lung remains clear of focal opacity. The heart silhouette largely obscured on the left, with left-sided volume loss and mediastinal shift. No vascular congestion is seen. Stable mediastinal configuration, as visualized. Stable bony thorax. IMPRESSION: Unchanged left hydropneumothorax with hydro component predominating. Electronically Signed   By: Telford Nab M.D.   On: 12/20/2021 05:53   DG Chest 2 View  Result Date: 12/19/2021 CLINICAL DATA:  [id number] with pleural effusion, pneumothorax. EXAM: CHEST - 2 VIEW COMPARISON:  PA Lat chest yesterday at 5:53 a.m., PA Lat chest 12/17/2021. FINDINGS: AP Lat, 4:41 a.m. Left thoracic near-opacification continues to be noted, and some degree of volume loss with left mediastinal shift consistent with an atelectatic component. Patchy aeration continues to be seen only in the left upper lung field above the hilar level, and a left hydropneumothorax is again noted with hydro component predominating, and only a small apical pneumo component. The pneumo component is probably no more than 5% volume. It is unchanged. Right lung remains clear aside from mild increased interstitial markings in the lung base. The heart is not well evaluated due to obscuration by the left lung consolidation/effusion. No vascular congestion is seen. There is a stable mediastinum with calcification of the transverse aorta. Osteopenia and degenerative change. IMPRESSION: Overall aeration is not significantly changed. There is a left hydropneumothorax with the hydro component predominating and opacifying the lower 2/3 of the left chest with only minimal aeration in the left upper lung field, with evidence of left-sided volume loss, mediastinal shift and elevated hemidiaphragm indicating an atelectatic component. The small left apical pneumo component is estimated 5% or less volume. Right  lung remains essentially clear. Electronically Signed   By: Telford Nab M.D.   On: 12/19/2021 05:07   DG Chest 2 View  Result Date: 12/18/2021 CLINICAL DATA:  Left pleural effusion. EXAM: CHEST - 2 VIEW COMPARISON:  Chest two views 12/17/2021 at 531 hours, 12/16/2021, CT chest 12/15/2021 FINDINGS: There is again near complete opacification of left hemithorax with air within the left lung apex corresponding to the hydropneumothorax seen on prior radiographs and CT. There is again dense left lung atelectasis/volume loss. No significant change in left lung aeration. Minimal linear right basilar subsegmental atelectasis. The cardiac  silhouette is not well evaluated due to the left hemithorax opacities. No gross mediastinal abnormality is seen. Moderate calcification within the aortic arch. No acute skeletal abnormality. IMPRESSION: No significant change from prior radiographs. There is again near complete opacification of left hemithorax with air within the left lung apex corresponding to the hydropneumothorax seen on prior radiographs and CT. Electronically Signed   By: Yvonne Kendall M.D.   On: 12/18/2021 08:24   DG Chest 2 View  Result Date: 12/17/2021 CLINICAL DATA:  Hydropneumothorax EXAM: CHEST - 2 VIEW COMPARISON:  12/16/2021 FINDINGS: Near complete opacification of the left hemithorax is once again identified with gas density at the apex compatible with hydropneumothorax and substantial underlying atelectasis or pneumonia in the left lung. There is some minimal increase in aerated lung along the left upper lobe laterally. Probable elevated left hemidiaphragm. Mild scarring or atelectasis at the right lung base appear stable. Left cardiac and mediastinal borders obscured by the left hemithoracic opacity. Atherosclerotic calcification of the aortic arch. IMPRESSION: 1. Minimal increase in aerated lung along the left upper lobe laterally. Otherwise no significant interval change. Near complete  opacification of the left hemithorax with gas density at the apex compatible with hydropneumothorax and substantial underlying atelectasis or pneumonia. 2.  Aortic Atherosclerosis (ICD10-I70.0). Electronically Signed   By: Van Clines M.D.   On: 12/17/2021 08:15   DG Chest 2 View  Result Date: 12/16/2021 CLINICAL DATA:  Left hydropneumothorax EXAM: CHEST - 2 VIEW COMPARISON:  Previous studies including the examinations done on 12/15/2021 FINDINGS: There is large left hydropneumothorax. Opacification of most of the left lung suggests underlying atelectasis of left lung. There is blunting of right lateral costophrenic angle. Small linear densities are seen in right lower lung field. No significant interval changes are noted. IMPRESSION: Large left hydropneumothorax with marked atelectasis in left lung. Small right pleural effusion. Subsegmental atelectasis is seen in right lower lung field. Electronically Signed   By: Elmer Picker M.D.   On: 12/16/2021 08:12   CT CHEST WO CONTRAST  Result Date: 12/15/2021 CLINICAL DATA:  Pleural effusion.  Status post left upper lobectomy. EXAM: CT CHEST WITHOUT CONTRAST TECHNIQUE: Multidetector CT imaging of the chest was performed following the standard protocol without IV contrast. RADIATION DOSE REDUCTION: This exam was performed according to the departmental dose-optimization program which includes automated exposure control, adjustment of the mA and/or kV according to patient size and/or use of iterative reconstruction technique. COMPARISON:  11/11/2021 FINDINGS: Cardiovascular: The heart size is normal. No substantial pericardial effusion. Coronary artery calcification is evident. Mild atherosclerotic calcification is noted in the wall of the thoracic aorta. Mediastinum/Nodes: No mediastinal lymphadenopathy. Left hilum obscured. No definite lymphadenopathy in the right hilum although assessment is limited by lack of intravenous contrast material. The  esophagus has normal imaging features. There is no axillary lymphadenopathy. Lungs/Pleura: Asymmetric elevation left hemidiaphragm is associated with collapse/consolidation in the left lung and left-sided hydropneumothorax. Nodular foci of ground-glass attenuation in the right upper lobe may be infectious/inflammatory atelectasis noted posterior right lower lobe. Upper Abdomen: Unremarkable. Musculoskeletal: No worrisome lytic or sclerotic osseous abnormality. IMPRESSION: 1. Asymmetric elevation left hemidiaphragm associated with collapse/consolidation in the left lung and moderate to large left-sided hydropneumothorax. 2. Nodular foci of ground-glass attenuation in the right upper lobe may be infectious/inflammatory. 3.  Aortic Atherosclerosis (ICD10-I70.0). These results will be called to the ordering clinician or representative by the Radiologist Assistant, and communication documented in the PACS or [employer]. Electronically Signed   By: Randall Hiss  Tery Sanfilippo M.D.   On: 12/15/2021 14:20   DG Chest 2 View  Result Date: 12/15/2021 CLINICAL DATA:  Left hydropneumothorax EXAM: CHEST - 2 VIEW COMPARISON:  Prior chest x-ray 12/14/2021 FINDINGS: Near total whiteout of the left hemithorax. Pneumothorax component remains present at the apex. Overall findings most consistent with large left hydropneumothorax. There is right to left shift of the cardiac and mediastinal contours suggesting volume loss consistent with known prior right upper lobectomy. Probable small right pleural effusion and associated right basilar atelectasis. IMPRESSION: 1. Progressive large left hydropneumothorax with complete whiteout of the left chest. 2. Trace right pleural effusion and basilar atelectasis. Electronically Signed   By: Jacqulynn Cadet M.D.   On: 12/15/2021 07:11   US THORACENTESIS ASP PLEURAL SPACE W/IMG GUIDE  Result Date: 12/14/2021 INDICATION: Patient with history of left upper lobectomy 12/12/2021 due to non-small cell  lung cancer, noted to have hydropneumothorax post chest tube removal yesterday. Request to IR for therapeutic left thoracentesis. EXAM: ULTRASOUND GUIDED LEFT THORACENTESIS MEDICATIONS: 10 mL 1% lidocaine COMPLICATIONS: None immediate. PROCEDURE: An ultrasound guided thoracentesis was thoroughly discussed with the patient and questions answered. The benefits, risks, alternatives and complications were also discussed. The patient understands and wishes to proceed with the procedure. Written consent was obtained. Ultrasound was performed to localize and mark an adequate pocket of fluid in the left chest. The area was then prepped and draped in the normal sterile fashion. 1% Lidocaine was used for local anesthesia. Under ultrasound guidance a 6 Fr Safe-T-Centesis catheter was introduced. Thoracentesis was performed. The catheter was removed and a dressing applied. FINDINGS: A total of approximately 400 mL of bloody fluid and air fluid were removed. Procedure aborted at this amount due to patient complaints of significant chest pain. IMPRESSION: Successful ultrasound guided left thoracentesis yielding 400 mL of pleural fluid. Read by Candiss Norse, PA-C Electronically Signed   By: Ruthann Cancer M.D.   On: 12/14/2021 14:16   DG Chest 1 View  Result Date: 12/14/2021 CLINICAL DATA:  Hydropneumothorax EXAM: CHEST  1 VIEW COMPARISON:  Same-day radiograph FINDINGS: Postsurgical changes from left upper lobectomy with left-sided hydropneumothorax. The pneumothorax component appears slightly decreased in size. Interval decrease in the fluid component of hydropneumothorax. Small right-sided pleural effusion. Stable heart size. No right-sided pneumothorax. IMPRESSION: 1. Slight interval decrease in size of the left-sided hydropneumothorax. 2. Small right-sided pleural effusion. Electronically Signed   By: Davina Poke D.O.   On: 12/14/2021 13:11   DG Chest 2 View  Result Date: 12/14/2021 CLINICAL DATA:  Status  post lobectomy December 12, 2021 EXAM: CHEST - 2 VIEW COMPARISON:  December 12, 2021 FINDINGS: Status post left upper lobectomy with persistent left apical pneumothorax and moderate layering pleural effusion. The left apical pneumothorax is probably slightly enlarged compared prior exam. Right basilar atelectasis is stable. Small right pleural effusion is noted. Mediastinal contour and cardiac silhouette are stable. IMPRESSION: Status post left upper lobectomy with persistent left apical pneumothorax and moderate layering pleural effusion. The left apical pneumothorax is probably slightly enlarged compared prior exam. Electronically Signed   By: Abelardo Diesel M.D.   On: 12/14/2021 08:17   DG Chest 1V REPEAT Same Day  Result Date: 12/13/2021 CLINICAL DATA:  Chest tube removal status post lobectomy EXAM: CHEST - 1 VIEW SAME DAY COMPARISON:  12/13/2021 FINDINGS: Interval removal of previously noted left apical chest tube. Status post left upper lobectomy. Small left apical pneumothorax, slightly increased from the prior exam. Moderate layering left pleural effusion, increased  from prior exam. Increased right basilar atelectasis with likely trace pleural effusion. Unchanged cardiac and mediastinal contours. Aortic atherosclerosis. No acute osseous abnormality. IMPRESSION: 1. Interval removal of previously noted left apical chest tube. Small left apical pneumothorax, slightly increased from the prior exam. 2. Moderate layering left pleural effusion, increased from prior exam. 3. Increased right basilar atelectasis with likely trace pleural effusion. Electronically Signed   By: Merilyn Baba M.D.   On: 12/13/2021 12:56   DG Chest Port 1 View  Result Date: 12/13/2021 CLINICAL DATA:  [id number] S/P lobectomy of lung [id number] EXAM: PORTABLE CHEST 1 VIEW COMPARISON:  Radiograph 12/12/2021 FINDINGS: Unchanged cardiomediastinal silhouette. Similar positioning of the left apical chest tube. New opacification of the left mid to  lower lung slightly decreased left apical pneumothorax. Mild right basilar atelectasis and probable trace effusion. Bones are unchanged. IMPRESSION: Postsurgical changes of left upper lobectomy with slight decreased size of the left apical pneumothorax and similar positioning of left apical chest tube. New left mid to lower lung opacification, likely reflective of fusion and/or atelectasis. Electronically Signed   By: Maurine Simmering M.D.   On: 12/13/2021 08:33   DG Chest Port 1 View  Result Date: 12/12/2021 CLINICAL DATA:  Status post lobectomy EXAM: PORTABLE CHEST 1 VIEW COMPARISON:  Chest x-ray 12/10/2021, 11/13/2021, 09/09/2021, chest CT 11/11/2021 FINDINGS: Interval left upper lobectomy. Placement of left-sided chest tube with tip at the medial left apex. Surgical sutures at the left hilar region. Moderate left apical pneumothorax with suspected small pneumothorax component at left CP angle. No shift. Mild airspace disease at the right base. Stable cardiomediastinal silhouette. IMPRESSION: 1. Status post left upper lobectomy with moderate left apical pneumothorax and probable small CP angle pneumothorax. Left-sided chest tube is in place. No shift. 2. Mild airspace disease at the right base, probably due to atelectasis. Electronically Signed   By: Donavan Foil M.D.   On: 12/12/2021 16:18   DG Chest 2 View  Result Date: 12/11/2021 CLINICAL DATA:  Preoperative exam. EXAM: CHEST - 2 VIEW COMPARISON:  Chest radiograph November 13, 2021; PET-CT November 11, 2021 FINDINGS: Stable cardiac and mediastinal contours. No large area pulmonary consolidation. Ill-defined nodular opacity left upper hemithorax. No pleural effusion or pneumothorax. Thoracic spine degenerative changes. IMPRESSION: 1. Ill-defined nodular opacity left upper hemithorax compatible with known primary pulmonary malignancy. 2. No acute cardiopulmonary process. Electronically Signed   By: Lovey Newcomer M.D.   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 Will check EGFR status-although low clinical possibility.  # I discussed the risk of recurrence-for stage II lung cancer-in the range of 30 to 40%;and the  benefit from adjuvant chemotherapy.  I discussed the goal of treatment would be cure.  Discussed that the absolute benefit from platinum based doublet chemotherapy is about 8 to 10%.  Based on PD-L1 status would also benefit from immunotherapy.     #Status adjuvant chemotherapy would include platinum [cisplatin or carboplatin] based on renal function in combination with Taxol.  Chemotherapy is recommended every 3 weeks x 4 cycles.    Discussed the potential side effects including but not limited to-increasing fatigue, nausea vomiting, diarrhea, hair loss, sores in the mouth, increase risk of infection and also neuropathy.   # I also discussed the role of immunotherapy for stage II and above resected lung cancer.  Patient's PDL is 70%-recommend  Aztezoluzimab every 3 weeks for 16 cycles/ 1 year.    I discussed the mechanism of action. Discussed the potential side effects of immunotherapy including but not limited to diarrhea; skin rash; elevated LFTs/endocrine abnormalities etc.  #Discussed that patient will need surveillance imaging every 6 months or so; irrespective of patient's decision for adjuvant therapy.  #After lengthy discussion with the patient and daughter-they expressed interest in the treatment plan; however stated that they would need time to process information.  They will let us know of their decision/will call [facility]  They are aware that I will be out of town for the next few weeks; and in my absence there could possibly see other providers to have a discussion; and plan is interested proceed with treatment plan.  Discussed with [facility].   Thank you Dr. Blase Mess for  allowing me to participate in the care of your pleasant patient. Please do not hesitate to contact me with questions or concerns in the interim.   # DISPOSITION:  # Follow up TBD- Dr.B  # I reviewed the blood work- with the patient in detail; also reviewed the imaging independently [as summarized above]; and with the patient in detail.     All questions were answered. The patient knows to call the clinic with any problems, questions or concerns.       Cammie Sickle, MD 01/03/2022 12:22 PM

## 2022-01-13 ENCOUNTER — Other Ambulatory Visit: Payer: Self-pay | Admitting: Thoracic Surgery (Cardiothoracic Vascular Surgery)

## 2022-01-13 DIAGNOSIS — C3490 Malignant neoplasm of unspecified part of unspecified bronchus or lung: Secondary | ICD-10-CM

## 2022-01-14 ENCOUNTER — Ambulatory Visit (INDEPENDENT_AMBULATORY_CARE_PROVIDER_SITE_OTHER): Payer: Self-pay | Admitting: Thoracic Surgery (Cardiothoracic Vascular Surgery)

## 2022-01-14 ENCOUNTER — Ambulatory Visit
Admission: RE | Admit: 2022-01-14 | Discharge: 2022-01-14 | Disposition: A | Discharge status: Home / Self Care | Payer: Medicare Other | Source: Ambulatory Visit | Attending: Thoracic Surgery (Cardiothoracic Vascular Surgery) | Admitting: Thoracic Surgery (Cardiothoracic Vascular Surgery)

## 2022-01-14 VITALS — BP 174/95 | HR 69 | Resp 18 | Ht 64.0 in | Wt 174.0 lb

## 2022-01-14 DIAGNOSIS — Z09 Encounter for follow-up examination after completed treatment for conditions other than malignant neoplasm: Secondary | ICD-10-CM

## 2022-01-14 DIAGNOSIS — J90 Pleural effusion, not elsewhere classified: Secondary | ICD-10-CM | POA: Diagnosis not present

## 2022-01-14 DIAGNOSIS — C3412 Malignant neoplasm of upper lobe, left bronchus or lung: Secondary | ICD-10-CM

## 2022-01-14 DIAGNOSIS — C3490 Malignant neoplasm of unspecified part of unspecified bronchus or lung: Secondary | ICD-10-CM

## 2022-01-14 DIAGNOSIS — Z85118 Personal history of other malignant neoplasm of bronchus and lung: Secondary | ICD-10-CM | POA: Diagnosis not present

## 2022-01-14 NOTE — Progress Notes (Signed)
[address]       [place] [address]             [phone]      HPI: [name] [name] returns for a scheduled postoperative follow-up visit after recent lobectomy.  Texie Tupou is a 71 year old woman with a history of tobacco abuse, fibromyalgia, hyperlipidemia, COPD, endometriosis, arthritis, postherpetic neuralgia, and depression.  She was found to have a left upper lobe lung nodule that was hypermetabolic on PET.  I did a robotic assisted left upper lobectomy and node dissection on 12/12/2020.  Pathology showed a T1, N1, stage IIb squamous cell carcinoma.  Postoperative course complicated by mucous plugging requiring bronchoscopy.  She continues to have some incisional pain.  She describes this as a sharp pain radiating around her rib cage.  Also complains of some bulging in the left upper quadrant.  No longer taking oxycodone.  She is prescribed Lyrica 75 mg p.o. twice daily but has only been taking it once a day.  I asked her a couple of times and she insisted that was the case.  Past Medical History:  Diagnosis Date   Arthritis    OA   Carpal tunnel syndrome    Depression    Endometriosis    Fibromyalgia    Hyperlipidemia    Left upper lobe pulmonary nodule 11/2021   Post herpetic neuralgia    Sleep disorder    Tobacco abuse     Current Outpatient Medications  Medication Sig Dispense Refill   aspirin 81 MG tablet Take 81 mg by mouth daily.       busPIRone (BUSPAR) 15 MG tablet TAKE 1/2 TABLET BY MOUTH TWICE A DAY (Patient taking differently: Take 15 mg by mouth daily.) 90 tablet 2   DULoxetine (CYMBALTA) 60 MG capsule TAKE 1 CAPSULE BY MOUTH EVERY DAY 90 capsule 2   Fluticasone-Umeclidin-Vilant (TRELEGY ELLIPTA) 100-62.5-25 MCG/ACT AEPB Inhale 1 puff into the lungs daily. (Patient taking differently: Inhale 1 puff into the lungs daily as needed (shortness of breath).) 28 each 11   Melatonin 10 MG CAPS Take 20 mg by mouth at bedtime.     naproxen  sodium (ALEVE) 220 MG tablet Take 440 mg by mouth daily as needed (headaches).     pregabalin (LYRICA) 75 MG capsule TAKE 1 CAPSULE BY MOUTH TWICE A DAY 180 capsule 1   No current facility-administered medications for this visit.    Physical Exam BP (!) 174/95 (BP Location: Left Arm, Patient Position: Sitting)   Pulse 69   Resp 18   Ht 5\' 4"  (1.626 m)   Wt 174 lb (78.9 kg)   SpO2 97% Comment: RA  BMI 29.87 kg/m  Well-appearing 71 year old woman in no acute distress Alert and oriented x3 with no focal deficits Lungs absent breath sounds at left base but otherwise clear Cardiac regular rate and rhythm Incisions well-healed Laxity left upper quadrant musculature  Diagnostic Tests: I personally reviewed the chest x-ray images.  Postoperative changes with volume loss on the left and small pleural effusion.  Unchanged from her previous film.  Impression: [name] [name] is a 71 year old woman with a history of tobacco abuse, fibromyalgia, hyperlipidemia, COPD, endometriosis, arthritis, postherpetic neuralgia, and depression.   She now is about a month out from lobectomy.  Pain is improved to the point where she no longer needs oxycodone, but is still bothersome to her.  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  Currently only taking Lyrica once a day  although it is prescribed to take twice a day.  I think the first step would be to start taking that twice daily.  She may drive.  Appropriate precautions were discussed.  She may gradually resume additional activities as tolerated.  She saw Dr. Rogue Bussing and is planning to proceed with adjuvant chemoimmunotherapy.  I went through the reasoning for treating even though as far as we know all the disease has been removed.  From a surgical standpoint she can go ahead with her adjuvant treatment at any time, but I would probably wait until 1 December unless absolutely necessary to start sooner.  Plan: Increase Lyrica to 75 mg twice  daily Follow-up with Dr. Rogue Bussing She has a follow-up appointment with Dr. Patsey Berthold at the end of December. I will plan to see her back in about 2 months with a chest x-ray to check on her progress.  [name], MD [facility] [phone]

## 2022-01-15 ENCOUNTER — Other Ambulatory Visit: Payer: Self-pay | Admitting: Family Medicine

## 2022-01-15 ENCOUNTER — Inpatient Hospital Stay (HOSPITAL_BASED_OUTPATIENT_CLINIC_OR_DEPARTMENT_OTHER): Payer: Medicare Other | Admitting: Hospice and Palliative Medicine

## 2022-01-15 DIAGNOSIS — C3412 Malignant neoplasm of upper lobe, left bronchus or lung: Secondary | ICD-10-CM

## 2022-01-15 NOTE — Telephone Encounter (Signed)
Spoke to patient by telephone and was advised that she is not on any other pain medication now.. Patient stated that she did request the Tramadol because the nerves where she had surgery is giving her a fit. Patient stated that she said something to the surgeon about going up on her Tramadol yesterday when she saw him. Patient stated that she was told to ask Dr. Glori Bickers since she is the one that prescribes the Tramadol.

## 2022-01-15 NOTE — Telephone Encounter (Signed)
Refill request Tramadol no longer on medication list discontinued 12/21/21 at discharge Lyrica last refill 05/09/20 #180/1 Last office visit acute 09/09/21

## 2022-01-15 NOTE — Telephone Encounter (Signed)
Please check in with her to see if she needs to re start it and ask if she is on any other pain medicine after her surgery (I think it was removed due to px for oxycodone post op) Thanks

## 2022-01-15 NOTE — Progress Notes (Signed)
Multidisciplinary Oncology Council Documentation  DEAIRRA HALLECK was presented by our [facility] on 01/15/2022, which included representatives from:  Palliative Care Dietitian  Physical/Occupational Therapist Nurse Navigator Genetics Speech Therapist Social work Survivorship RN Financial Navigator Research RN   Geraldyne currently presents with history of lung cancer  We reviewed previous medical and familial history, history of present illness, and recent lab results along with all available histopathologic and imaging studies. The [facility] considered available treatment options and made the following recommendations/referrals:  None currently, consider nutrition if patient desires chemo  The MOC is a meeting of clinicians from various specialty areas who evaluate and discuss patients for whom a multidisciplinary approach is being considered. Final determinations in the plan of care are those of the provider(s).   Today's extended care, comprehensive team conference, Fawne was not present for the discussion and was not examined.

## 2022-01-16 ENCOUNTER — Other Ambulatory Visit: Payer: Self-pay | Admitting: Family Medicine

## 2022-01-21 ENCOUNTER — Encounter: Payer: Self-pay | Admitting: *Deleted

## 2022-01-21 DIAGNOSIS — Z902 Acquired absence of lung [part of]: Secondary | ICD-10-CM | POA: Diagnosis not present

## 2022-01-21 DIAGNOSIS — C3490 Malignant neoplasm of unspecified part of unspecified bronchus or lung: Secondary | ICD-10-CM | POA: Diagnosis not present

## 2022-01-21 NOTE — Progress Notes (Signed)
Results received from molecular testing for EGFR mutation. Pt informed that her results are negative and that recommended treatment after surgery would be IV chemotherapy as previously discussed with Dr. Brahmanday. Pt verbalized understanding. Follow up appt scheduled with Dr. B after the holiday to further discuss adjuvant chemotherapy and plan treatments. Pt verbalized understanding. Nothing further needed at this time. 

## 2022-01-30 ENCOUNTER — Inpatient Hospital Stay: Payer: Medicare Other | Admitting: Internal Medicine

## 2022-01-30 ENCOUNTER — Encounter: Payer: Self-pay | Admitting: Internal Medicine

## 2022-01-30 ENCOUNTER — Encounter: Payer: Self-pay | Admitting: *Deleted

## 2022-01-30 VITALS — BP 131/81 | HR 70 | Temp 96.8°F | Resp 20 | Wt 174.1 lb

## 2022-01-30 DIAGNOSIS — Z87891 Personal history of nicotine dependence: Secondary | ICD-10-CM | POA: Diagnosis not present

## 2022-01-30 DIAGNOSIS — Z902 Acquired absence of lung [part of]: Secondary | ICD-10-CM | POA: Diagnosis not present

## 2022-01-30 DIAGNOSIS — C3412 Malignant neoplasm of upper lobe, left bronchus or lung: Secondary | ICD-10-CM

## 2022-01-30 MED ORDER — DEXAMETHASONE 4 MG PO TABS: 4.0000 mg | ORAL_TABLET | Freq: Two times a day (BID) | ORAL | 0 refills | Status: DC

## 2022-01-30 MED ORDER — LIDOCAINE-PRILOCAINE 2.5-2.5 % EX CREA: TOPICAL_CREAM | CUTANEOUS | 3 refills | Status: DC

## 2022-01-30 MED ORDER — PROCHLORPERAZINE MALEATE 10 MG PO TABS: 10.0000 mg | ORAL_TABLET | Freq: Four times a day (QID) | ORAL | 1 refills | Status: AC | PRN

## 2022-01-30 MED ORDER — ONDANSETRON HCL 8 MG PO TABS: ORAL_TABLET | ORAL | 1 refills | Status: AC

## 2022-01-30 NOTE — Progress Notes (Signed)
[name] CONSULT NOTE  Patient Care Team: Tower, [name], MD as PCP - General Telford Nab, RN as Oncology Nurse Navigator  CHIEF COMPLAINTS/PURPOSE OF CONSULTATION: lung cancer  #  Oncology History Overview Note  JULY 2023- 1. 10 mm mean diameter nodule in the left upper lobe adjacent to an area of pleural and parenchymal scarring.     SEP 2023- 1. Hypermetabolic spiculated nodular left upper lobe pulmonary lesion is compatible with primary bronchogenic neoplasm. 2. Mildly metabolic nodularity in the left hilum, without discrete adenopathy may reflect early nodal disease involvement. Suggest attention on follow-up imaging. 3. No evidence of distant hypermetabolic metastatic disease. 4. Hypermetabolic nodule in the left lobe of the thyroid, Recommend thyroid US and biopsy (ref: J Am Coll Radiol. 2015 Feb;12(2): 143-50). 5. Aortic Atherosclerosis (ICD10-I70.0) and Emphysema (ICD10-J43.9).  # LUNG: Resection  Synchronous Tumors: Not applicable Total Number of Primary Tumors: 1 Procedure: Lobectomy, lung Specimen Laterality: Left Tumor Focality: Unifocal Tumor Site: Upper lobe Tumor Size: 2.6 cm Histologic Type: Squamous cell carcinoma, moderately differentiated Visceral Pleura Invasion: Not identified Direct Invasion of Adjacent Structures: No adjacent structures present Lymphovascular Invasion: Not identified Margins: All margins negative for invasive carcinoma      Closest Margin(s) to Invasive Carcinoma: Bronchovascular margin      Margin(s) Involved by Invasive Carcinoma: Not applicable       Margin Status for Non-Invasive Tumor: Negative Treatment Effect: No known presurgical therapy Regional Lymph Nodes:      Number of Lymph Nodes Involved: 1                           Nodal Sites with Tumor: Level 10      Number of Lymph Nodes Examined: 21                      Nodal Sites Examined: Levels 4, 5, 7, 9, 10, 11, 12 and 13 Distant Metastasis:       Distant Site(s) Involved: Not applicable Pathologic Stage Classification (pTNM, AJCC 8th Edition): pT1c, pN1 Ancillary Studies: Can be performed upon request Representative Tumor Block: B5 Comment(s): None    Lung cancer ([place])  12/05/2021 Initial Diagnosis   Lung cancer ([place])   12/18/2021 Cancer Staging   Staging form: Lung, AJCC 8th Edition - Pathologic stage from 12/18/2021: Stage IIB (pT1c, pN1, cM0) - Signed by [name], MD on 12/18/2021 Histopathologic type: Squamous cell carcinoma, NOS   Cancer of upper lobe of left lung ([place])  01/03/2022 Initial Diagnosis   Cancer of upper lobe of left lung ([place])   01/03/2022 Cancer Staging   Staging form: Lung, AJCC 8th Edition - Pathologic: Stage IIB (pT1c, pN1, cM0) - Signed by Cammie Sickle, MD on 01/03/2022   02/13/2022 -  Chemotherapy   Patient is on Treatment Plan : LUNG Cisplatin + Docetaxel q21d      HISTORY OF PRESENTING ILLNESS: Ambulating independently.  Accompanied by daughter.  [name] [name] 71 y.o.  female history of smoking with stage II T2 N1 squamous cell carcinoma left lung cancer is here for to review the results of her molecular testing/treatment plan.  Patient is recovering well.  Denies any worsening pain.  Denies any worsening shortness of breath.  Review of Systems  Constitutional:  Negative for chills, diaphoresis, fever, malaise/fatigue and weight loss.  HENT:  Negative for nosebleeds and sore throat.   Eyes:  Negative for double vision.  Respiratory:  Negative for cough, hemoptysis, sputum production, shortness of breath and wheezing.   Cardiovascular:  Negative for chest pain, palpitations, orthopnea and leg swelling.  Gastrointestinal:  Negative for abdominal pain, blood in stool, constipation, diarrhea, heartburn, melena, nausea and vomiting.  Genitourinary:  Negative for dysuria, frequency and urgency.  Musculoskeletal:  Negative for back pain and joint pain.  Skin: Negative.   Negative for itching and rash.  Neurological:  Negative for dizziness, tingling, focal weakness, weakness and headaches.  Endo/Heme/Allergies:  Does not bruise/bleed easily.  Psychiatric/Behavioral:  Negative for depression. The patient is not nervous/anxious and does not have insomnia.      MEDICAL HISTORY:  Past Medical History:  Diagnosis Date   Arthritis    OA   Carpal tunnel syndrome    Depression    Endometriosis    Fibromyalgia    Hyperlipidemia    Left upper lobe pulmonary nodule 11/2021   Post herpetic neuralgia    Sleep disorder    Tobacco abuse     SURGICAL HISTORY: Past Surgical History:  Procedure Laterality Date   ABDOMINAL HYSTERECTOMY  1992   total endometriosis   APPENDECTOMY  1992   bladder tack  1992   CATARACT EXTRACTION W/PHACO Right 03/25/2018   Procedure: CATARACT EXTRACTION PHACO AND INTRAOCULAR LENS PLACEMENT ([place]) RIGHT;  Surgeon: Marchia Meiers, MD;  Location: [facility];  Service: Ophthalmology;  Laterality: Right;  [place]  01:11 CDE 8.02 Fluid pack lot # [id number]   CATARACT EXTRACTION W/PHACO Left 04/22/2018   Procedure: CATARACT EXTRACTION PHACO AND INTRAOCULAR LENS PLACEMENT ([place]) Left Eye;  Surgeon: Marchia Meiers, MD;  Location: [facility];  Service: Ophthalmology;  Laterality: Left;  [place]  01:02 CDE 8.11 Fluid pack lot # [id number]   COLONOSCOPY     FOOT SURGERY Right 04/2016   Ethan in [place] great toe; straightening others   INTERCOSTAL NERVE BLOCK Left 12/12/2021   Procedure: INTERCOSTAL NERVE BLOCK;  Surgeon: [name], MD;  Location: [facility];  Service: Thoracic;  Laterality: Left;   LYMPH NODE DISSECTION Left 12/12/2021   Procedure: LYMPH NODE DISSECTION;  Surgeon: [name], MD;  Location: [facility];  Service: Thoracic;  Laterality: Left;   VIDEO BRONCHOSCOPY N/A 12/16/2021   Procedure: VIDEO BRONCHOSCOPY;  Surgeon: [name], MD;  [facility];  Service: Thoracic;  Laterality: N/A;    SOCIAL  HISTORY: Social History   Socioeconomic History   Marital status: Widowed    Spouse name: Not on file   Number of children: 2   Years of education: Not on file   Highest education level: Not on file  Occupational History   Not on file  Tobacco Use   Smoking status: Former    Packs/day: 1.00    Years: 55.00    Total pack years: 55.00    Types: Cigarettes    Quit date: 12/03/2021    Years since quitting: 0.1   Smokeless tobacco: Never  Vaping Use   Vaping Use: Some days  Substance and Sexual Activity   Alcohol use: No    Alcohol/week: 0.0 standard drinks of alcohol   Drug use: No   Sexual activity: Not Currently  Other Topics Concern   Not on file  Social History Narrative   Lives alone   Social Determinants of Health   Financial Resource Strain: Low Risk  (02/01/2021)   Overall Financial Resource Strain (CARDIA)    Difficulty of Paying Living Expenses: Not hard at all  Food  Insecurity: No Food Insecurity (02/01/2021)   Hunger Vital Sign    Worried About Running Out of Food in the Last Year: Never true    Ran Out of Food in the Last Year: Never true  Transportation Needs: No Transportation Needs (02/01/2021)   PRAPARE - Hydrologist (Medical): No    Lack of Transportation (Non-Medical): No  Physical Activity: Inactive (02/01/2021)   Exercise Vital Sign    Days of Exercise per Week: 0 days    Minutes of Exercise per Session: 0 min  Stress: No Stress Concern Present (02/01/2021)   [place]    Feeling of Stress : Not at all  Social Connections: [place] (02/01/2021)   Social Connection and Isolation Panel [NHANES]    Frequency of Communication with Friends and Family: More than three times a week    Frequency of Social Gatherings with Friends and Family: More than three times a week    Attends Religious Services: More than 4 times per year    Active Member of [employer]  or Organizations: Yes    Attends Music therapist: More than 4 times per year    Marital Status: Married  Human resources officer Violence: Not At Risk (02/01/2021)   Humiliation, Afraid, Rape, and Kick questionnaire    Fear of Current or Ex-Partner: No    Emotionally Abused: No    Physically Abused: No    Sexually Abused: No    FAMILY HISTORY: Family History  Problem Relation Age of Onset   Heart disease Mother        MI   Heart disease Father        MI   Arthritis Sister        crippling arthritis   Arthritis Brother        crippling arthritis    ALLERGIES:  has No Known Allergies.  MEDICATIONS:  Current Outpatient Medications  Medication Sig Dispense Refill   aspirin 81 MG tablet Take 81 mg by mouth daily.       busPIRone (BUSPAR) 15 MG tablet TAKE 1/2 TABLET BY MOUTH TWICE A DAY (Patient taking differently: Take 15 mg by mouth daily.) 90 tablet 2   dexamethasone (DECADRON) 4 MG tablet Take 1 tablet (4 mg total) by mouth 2 (two) times daily with a meal. Start 2 days prior to chemo. Take for 2 days; DO NOT take on the day of chemo. 60 tablet 0   DULoxetine (CYMBALTA) 60 MG capsule TAKE 1 CAPSULE BY MOUTH EVERY DAY 90 capsule 2   Fluticasone-Umeclidin-Vilant (TRELEGY ELLIPTA) 100-62.5-25 MCG/ACT AEPB Inhale 1 puff into the lungs daily. (Patient taking differently: Inhale 1 puff into the lungs daily as needed (shortness of breath).) 28 each 11   lidocaine-prilocaine (EMLA) cream Apply on the port. 30 -45 min  prior to port access. 30 g 3   Melatonin 10 MG CAPS Take 20 mg by mouth at bedtime.     naproxen sodium (ALEVE) 220 MG tablet Take 440 mg by mouth daily as needed (headaches).     ondansetron (ZOFRAN) 8 MG tablet One pill every 8 hours as needed for nausea/vomitting. 40 tablet 1   pregabalin (LYRICA) 75 MG capsule TAKE 1 CAPSULE BY MOUTH TWICE A DAY 180 capsule 1   prochlorperazine (COMPAZINE) 10 MG tablet Take 1 tablet (10 mg total) by mouth every 6 (six) hours as  needed for nausea or vomiting. 40 tablet 1  traMADol (ULTRAM) 50 MG tablet TAKE 1 TABLET BY MOUTH EVERY 8 HOURS AS NEEDED. 90 tablet 0   No current facility-administered medications for this visit.      Marland Kitchen  PHYSICAL EXAMINATION: ECOG PERFORMANCE STATUS: 0 - Asymptomatic  Vitals:   01/30/22 1121  BP: 131/81  Pulse: 70  Resp: 20  Temp: (!) 96.8 F (36 C)  SpO2: 98%   Filed Weights   01/30/22 1121  Weight: 174 lb 1.6 oz (79 kg)    Physical Exam Vitals and nursing note reviewed.  Constitutional:      Comments:     HENT:     Head: Normocephalic and atraumatic.     Mouth/Throat:     Mouth: Mucous membranes are moist.     Pharynx: No oropharyngeal exudate.  Eyes:     Pupils: Pupils are equal, round, and reactive to light.  Cardiovascular:     Rate and Rhythm: Normal rate and regular rhythm.  Pulmonary:     Effort: No respiratory distress.     Breath sounds: No wheezing.     Comments: Decreased breath sounds bilaterally at bases.  No wheeze or crackles Abdominal:     General: Bowel sounds are normal. There is no distension.     Palpations: Abdomen is soft. There is no mass.     Tenderness: There is no abdominal tenderness. There is no guarding or rebound.  Musculoskeletal:        General: No tenderness. Normal range of motion.     Cervical back: Normal range of motion and neck supple.  Skin:    General: Skin is warm.  Neurological:     Mental Status: She is alert and oriented to person, place, and time.  Psychiatric:        Mood and Affect: Affect normal.        Judgment: Judgment normal.      LABORATORY DATA:  I have reviewed the data as listed Lab Results  Component Value Date   WBC 7.7 12/19/2021   HGB 11.0 (L) 12/19/2021   HCT 31.8 (L) 12/19/2021   MCV 89.6 12/19/2021   PLT 250 12/19/2021   Recent Labs    06/17/21 0904 12/10/21 1450 12/13/21 0102 12/14/21 0020 12/16/21 0018 12/19/21 0019  NA 136 135   < > 136 134* 133*  K 4.9 4.3   < > 5.1  4.5 5.1  CL 102 106   < > 103 102 97*  CO2 29 24   < > _0 GLUCOSE 95 78   < > 96 108* 99  BUN 10 10   < > _1 CREATININE 0.70 0.68   < > 0.99 0.63 0.74  CALCIUM 9.6 9.3   < > 9.0 8.9 9.2  GFRNONAA  --  >60   < > >60 >60 >60  PROT 6.7 6.5  --  5.6*  --   --   ALBUMIN 4.1 3.6  --  3.1*  --   --   AST 11 18  --  17  --   --   ALT 7 14  --  12  --   --   ALKPHOS 93 89  --  63  --   --   BILITOT 0.6 0.9  --  0.4  --   --    < > = values in this interval not displayed.    RADIOGRAPHIC STUDIES: I have personally reviewed the radiological images as listed and  agreed with the findings in the report. DG Chest 2 View  Result Date: 01/14/2022 CLINICAL DATA:  History of lung cancer and left upper lobe lobectomy. EXAM: CHEST - 2 VIEW COMPARISON:  Multiple previous chest x-rays. FINDINGS: The cardiac silhouette, mediastinal and hilar contours are within normal limits. Persistent loss of volume in the left hemithorax and moderate-sized left pleural effusion. The right lung remains clear. IMPRESSION: Persistent loss of volume in the left hemithorax and moderate-sized left pleural effusion. Electronically Signed   By: Marijo Sanes M.D.   On: 01/14/2022 11:12    ASSESSMENT & PLAN:   Cancer of upper lobe of left lung ([place]) # Left upper lobe-lung cancer moderately differentiated squamous cell carcinoma-pT1pN1- [IIB]; PDL-1 =70%. Will check EGFR status-although low clinical possibility.  # I discussed the risk of recurrence-for stage II lung cancer-in the range of 30 to 40%;and the  benefit from adjuvant chemotherapy.  I discussed the goal of treatment would be cure.  Discussed that the absolute benefit from platinum based doublet chemotherapy is about 8 to 10%.  Based on PD-L1 status would also benefit from immunotherapy.     # Recommend adjuvant chemotherapy cisplatin-Taxotere every 3 weeks x 4 cycles.  Growth factor-Neulasta/On pro would be given as prophylaxis for chemotherapy-induced  neutropenia to prevent febrile neutropenias.    Discussed the potential side effects including but not limited to-increasing fatigue, nausea vomiting, diarrhea, hair loss, sores in the mouth, increase risk of infection and also neuropathy.   # I also discussed the role of immunotherapy for stage II and above resected lung cancer.  Patient's PDL is 70%-recommend Aztezoluzimab/Pembrolizumab every 3 weeks for 16 cycles/ 1 year.    I discussed the mechanism of action. Discussed the potential side effects of immunotherapy including but not limited to diarrhea; skin rash; elevated LFTs/endocrine abnormalities etc.  # Chemotherapy education; port placement. Hopefully the planned start chemotherapy next 2 weeks;  Antiemetics-Zofran and Compazine; EMLA cream sent to pharmacy.    # DISPOSITION:  # mediport refer to IR # chemo education- cisplatin-taxotere # Follow up in 2 weeks-WED- MD; labs- cbc/cmp;mag; Thursday-D-2 Cispatin-Taxotere; FridayD-2 [name]- Dr.B     All questions were answered. The patient knows to call the clinic with any problems, questions or concerns.       Cammie Sickle, MD 01/30/2022 3:42 PM

## 2022-01-30 NOTE — Assessment & Plan Note (Addendum)
#  Left upper lobe-lung cancer moderately differentiated squamous cell carcinoma-pT1pN1- [IIB]; PDL-1 =70%. EGFR status-WILD   # I discussed the risk of recurrence-for stage II lung cancer-in the range of 30 to 40%;and the  benefit from adjuvant chemotherapy.  I discussed the goal of treatment would be cure.     # Recommend adjuvant chemotherapy cisplatin-Taxotere every 3 weeks x 4 cycles.  Growth factor-Neulasta/On pro would be given as prophylaxis for chemotherapy-induced neutropenia to prevent febrile neutropenias.    Discussed the potential side effects including but not limited to-increasing fatigue, nausea vomiting, diarrhea, hair loss, sores in the mouth, increase risk of infection and also neuropathy.   # I also discussed the role of immunotherapy for stage II and above resected lung cancer.  Patient's PDL is 70%-recommend Aztezoluzimab/Pembrolizumab every 3 weeks for 16 cycles/ 1 year.    I discussed the mechanism of action. Discussed the potential side effects of immunotherapy including but not limited to diarrhea; skin rash; elevated LFTs/endocrine abnormalities etc.  # Chemotherapy education; port placement. Hopefully the planned start chemotherapy next 2 weeks;  Antiemetics-Zofran and Compazine; EMLA cream sent to pharmacy.   # I had a long discussion with patient and daughter regarding her major concerns for potential side effects of chemotherapy.  If she does noticed to have significant side effects-patient understand that chemotherapy will be discontinued.  Discussed with [facility].  # DISPOSITION:  # mediport refer to IR # chemo education- cisplatin-taxotere # Follow up in 2 weeks-WED- MD; labs- cbc/cmp;mag; Thursday-D-2 Cispatin-Taxotere; FridayD-2 [name]- Dr.B

## 2022-01-30 NOTE — Progress Notes (Signed)
Met with patient during follow up visit with Dr. Rogue Bussing. All questions answered during visit. Pt informed that she will be called with appts for port placement and treatment start. Nothing further needed at this time. Instructed pt to call with any questions or needs. Pt verbalized understanding.

## 2022-01-30 NOTE — Progress Notes (Signed)
Patient has no concerns 

## 2022-01-30 NOTE — Progress Notes (Signed)
START ON PATHWAY REGIMEN - Non-Small Cell Lung     A cycle is every 21 days:     Docetaxel      Cisplatin   **Always confirm dose/schedule in your pharmacy ordering system**  Patient Characteristics: Postoperative without Neoadjuvant Therapy (Pathologic Staging), Stage II-III, Adjuvant Chemotherapy, Stage IIB, Squamous Cell Therapeutic Status: Postoperative without Neoadjuvant Therapy (Pathologic Staging) AJCC T Category: pT1c AJCC N Category: pN1 AJCC M Category: cM0 AJCC 8 Stage Grouping: IIB Adjuvant Chemotherapy Status: Adjuvant Chemotherapy ALK Rearrangement Status: Negative EGFR Mutation Status: Negative/Wild Type Histology: Squamous Cell Intent of Therapy: Curative Intent, Discussed with Patient

## 2022-01-31 ENCOUNTER — Other Ambulatory Visit: Payer: Self-pay

## 2022-02-03 ENCOUNTER — Telehealth: Payer: Self-pay | Admitting: Internal Medicine

## 2022-02-03 ENCOUNTER — Other Ambulatory Visit: Payer: Self-pay | Admitting: Internal Medicine

## 2022-02-03 ENCOUNTER — Inpatient Hospital Stay: Payer: Medicare Other

## 2022-02-03 DIAGNOSIS — C3412 Malignant neoplasm of upper lobe, left bronchus or lung: Secondary | ICD-10-CM

## 2022-02-03 NOTE — Telephone Encounter (Signed)
Spoke with pt who confirmed upcoming appts

## 2022-02-03 NOTE — Telephone Encounter (Signed)
Patient scheduled for Chemo class on 12/7 and to start treatment on 12/13 (Decoupled). Left appointment details and requested she call back with any questions or concerns.

## 2022-02-03 NOTE — Telephone Encounter (Signed)
Pt missed Chemo EDU. I left pt a VM to call back and reschedule. Chemo is to start week of 12/11.

## 2022-02-04 ENCOUNTER — Telehealth: Payer: Self-pay

## 2022-02-04 ENCOUNTER — Other Ambulatory Visit: Payer: Self-pay | Admitting: Radiology

## 2022-02-04 DIAGNOSIS — Z902 Acquired absence of lung [part of]: Secondary | ICD-10-CM

## 2022-02-04 NOTE — Telephone Encounter (Signed)
Unsuccessful attempt to reach patient X2 on preferred number listed in notes for scheduled AWV Left message on voicemail okay to reschedule

## 2022-02-04 NOTE — Progress Notes (Signed)
Patient for IR Port Placement on Wed 02/05/2022, I called and spoke with the patient on the phone and gave pre-procedure instructions. Pt was made aware to be here at 12:30p at the new entrance, NPO after MN prior to procedure as well as driver post procedure/recovery/discharge. Pt stated understanding.  Called 02/05/2022

## 2022-02-05 ENCOUNTER — Ambulatory Visit
Admission: RE | Admit: 2022-02-05 | Discharge: 2022-02-05 | Disposition: A | Discharge status: Home / Self Care | Payer: Medicare Other | Source: Ambulatory Visit | Attending: Internal Medicine | Admitting: Internal Medicine

## 2022-02-05 DIAGNOSIS — C3412 Malignant neoplasm of upper lobe, left bronchus or lung: Secondary | ICD-10-CM

## 2022-02-05 MED ORDER — LIDOCAINE-EPINEPHRINE 1 %-1:100000 IJ SOLN
INTRAMUSCULAR | Status: AC
  Filled 2022-02-05: qty 1

## 2022-02-05 MED ORDER — HEPARIN SOD (PORK) LOCK FLUSH 100 UNIT/ML IV SOLN
INTRAVENOUS | Status: AC
  Filled 2022-02-05: qty 5

## 2022-02-06 ENCOUNTER — Inpatient Hospital Stay: Payer: Medicare Other

## 2022-02-10 ENCOUNTER — Inpatient Hospital Stay: Payer: Medicare Other | Attending: Internal Medicine

## 2022-02-10 DIAGNOSIS — Z902 Acquired absence of lung [part of]: Secondary | ICD-10-CM | POA: Insufficient documentation

## 2022-02-10 DIAGNOSIS — Z5111 Encounter for antineoplastic chemotherapy: Secondary | ICD-10-CM | POA: Insufficient documentation

## 2022-02-10 DIAGNOSIS — Z87891 Personal history of nicotine dependence: Secondary | ICD-10-CM | POA: Insufficient documentation

## 2022-02-10 DIAGNOSIS — Z5189 Encounter for other specified aftercare: Secondary | ICD-10-CM | POA: Insufficient documentation

## 2022-02-10 DIAGNOSIS — C3412 Malignant neoplasm of upper lobe, left bronchus or lung: Secondary | ICD-10-CM | POA: Insufficient documentation

## 2022-02-12 ENCOUNTER — Inpatient Hospital Stay: Payer: Medicare Other

## 2022-02-12 ENCOUNTER — Inpatient Hospital Stay (HOSPITAL_BASED_OUTPATIENT_CLINIC_OR_DEPARTMENT_OTHER): Payer: Medicare Other | Admitting: Internal Medicine

## 2022-02-12 VITALS — BP 131/70 | HR 61 | Temp 97.0°F | Resp 18 | Wt 176.6 lb

## 2022-02-12 DIAGNOSIS — C3412 Malignant neoplasm of upper lobe, left bronchus or lung: Secondary | ICD-10-CM

## 2022-02-12 DIAGNOSIS — Z902 Acquired absence of lung [part of]: Secondary | ICD-10-CM | POA: Diagnosis not present

## 2022-02-12 DIAGNOSIS — Z87891 Personal history of nicotine dependence: Secondary | ICD-10-CM | POA: Diagnosis not present

## 2022-02-12 DIAGNOSIS — Z5189 Encounter for other specified aftercare: Secondary | ICD-10-CM | POA: Diagnosis not present

## 2022-02-12 DIAGNOSIS — Z5111 Encounter for antineoplastic chemotherapy: Secondary | ICD-10-CM | POA: Diagnosis not present

## 2022-02-12 LAB — CBC WITH DIFFERENTIAL/PLATELET
Abs Immature Granulocytes: 0.03 10*3/uL (ref 0.00–0.07)
Basophils Absolute: 0 10*3/uL (ref 0.0–0.1)
Basophils Relative: 0 %
Eosinophils Absolute: 0.3 10*3/uL (ref 0.0–0.5)
Eosinophils Relative: 5 %
HCT: 40.5 % (ref 36.0–46.0)
Hemoglobin: 13.2 g/dL (ref 12.0–15.0)
Immature Granulocytes: 1 %
Lymphocytes Relative: 37 %
Lymphs Abs: 2.2 10*3/uL (ref 0.7–4.0)
MCH: 29.1 pg (ref 26.0–34.0)
MCHC: 32.6 g/dL (ref 30.0–36.0)
MCV: 89.2 fL (ref 80.0–100.0)
Monocytes Absolute: 0.5 10*3/uL (ref 0.1–1.0)
Monocytes Relative: 8 %
Neutro Abs: 2.9 10*3/uL (ref 1.7–7.7)
Neutrophils Relative %: 49 %
Platelets: 248 10*3/uL (ref 150–400)
RBC: 4.54 MIL/uL (ref 3.87–5.11)
RDW: 13.2 % (ref 11.5–15.5)
WBC: 5.8 10*3/uL (ref 4.0–10.5)
nRBC: 0 % (ref 0.0–0.2)

## 2022-02-12 LAB — COMPREHENSIVE METABOLIC PANEL
ALT: 14 U/L (ref 0–44)
AST: 16 U/L (ref 15–41)
Albumin: 3.8 g/dL (ref 3.5–5.0)
Alkaline Phosphatase: 103 U/L (ref 38–126)
Anion gap: 6 (ref 5–15)
BUN: 8 mg/dL (ref 8–23)
CO2: 27 mmol/L (ref 22–32)
Calcium: 9.4 mg/dL (ref 8.9–10.3)
Chloride: 104 mmol/L (ref 98–111)
Creatinine, Ser: 0.63 mg/dL (ref 0.44–1.00)
GFR, Estimated: >60 mL/min (ref >60)
Glucose, Bld: 108 mg/dL — ABNORMAL HIGH (ref 70–99)
Potassium: 5 mmol/L (ref 3.5–5.1)
Sodium: 137 mmol/L (ref 135–145)
Total Bilirubin: 0.5 mg/dL (ref 0.3–1.2)
Total Protein: 7.3 g/dL (ref 6.5–8.1)

## 2022-02-12 LAB — MAGNESIUM: Magnesium: 2.1 mg/dL (ref 1.7–2.4)

## 2022-02-12 MED FILL — Fosaprepitant Dimeglumine For IV Infusion 150 MG (Base Eq): INTRAVENOUS | Qty: 5 | Status: AC

## 2022-02-12 MED FILL — Dexamethasone Sodium Phosphate Inj 100 MG/10ML: INTRAMUSCULAR | Qty: 1 | Status: AC

## 2022-02-12 NOTE — Progress Notes (Signed)
Patient here today for follow up and treatment consideration regarding lung cancer. Patient reports appetite is good, denies nausea or vomiting. Patient was able to pick up all pre-medications, she did not start dexamethasone yesterday. Patient did not have port placed on 12/6 as planned but does wish to pursue port placement. Nurse will reach out to have port placement rescheduled. Patient denies any other needs or concerns at this time.

## 2022-02-12 NOTE — Assessment & Plan Note (Addendum)
#  Left upper lobe-lung cancer moderately differentiated squamous cell carcinoma-pT1pN1- [IIB]; PDL-1 =70%. EGFR status-WILD.   adjuvant chemotherapy-cisplatin Taxotere x 4 cycles; followed by adjuvant Atezo/Pembrolizumab x1 year.    # proceed with  adjuvant chemotherapy cisplatin-Taxotere every 3 weeks cycle #1 of planned 4 treatments tomorrow.   Labs today reviewed;  acceptable for treatment.  Growth factor-Neulasta/On pro would be given as prophylaxis for chemotherapy-induced neutropenia to prevent febrile neutropenias.  Discussed regarding use of Claritin to help with joint pains.  I discussed the mechanism of action. Discussed the potential side effects of immunotherapy including but not limited to diarrhea; skin rash; elevated LFTs/endocrine abnormalities etc. Reminded of Dex day prior to treatment.   # DISPOSITION:  # chemo as planned tomorrow  # follow up in 1 week- APP;labs- cbc/bmp;mag; possible IVFs over 1 hour  # Follow up in 3 weeks-WED- MD; labs- cbc/cmp;mag; Thursday-D-2 Cispatin-Taxotere; FridayD-2 [name]- Dr.B

## 2022-02-12 NOTE — Progress Notes (Signed)
[facility] CONSULT NOTE  Patient Care Team: Tower, [name], MD as PCP - General Telford Nab, RN as Oncology Nurse Navigator  CHIEF COMPLAINTS/PURPOSE OF CONSULTATION: lung cancer  #  Oncology History Overview Note  JULY 2023- 1. 10 mm mean diameter nodule in the left upper lobe adjacent to an area of pleural and parenchymal scarring.     SEP 2023- 1. Hypermetabolic spiculated nodular left upper lobe pulmonary lesion is compatible with primary bronchogenic neoplasm. 2. Mildly metabolic nodularity in the left hilum, without discrete adenopathy may reflect early nodal disease involvement. Suggest attention on follow-up imaging. 3. No evidence of distant hypermetabolic metastatic disease. 4. Hypermetabolic nodule in the left lobe of the thyroid, Recommend thyroid US and biopsy (ref: J Am Coll Radiol. 2015 Feb;12(2): 143-50). 5. Aortic Atherosclerosis (ICD10-I70.0) and Emphysema (ICD10-J43.9).  # LUNG: Resection  Synchronous Tumors: Not applicable Total Number of Primary Tumors: 1 Procedure: Lobectomy, lung Specimen Laterality: Left Tumor Focality: Unifocal Tumor Site: Upper lobe Tumor Size: 2.6 cm Histologic Type: Squamous cell carcinoma, moderately differentiated Visceral Pleura Invasion: Not identified Direct Invasion of Adjacent Structures: No adjacent structures present Lymphovascular Invasion: Not identified Margins: All margins negative for invasive carcinoma      Closest Margin(s) to Invasive Carcinoma: Bronchovascular margin      Margin(s) Involved by Invasive Carcinoma: Not applicable       Margin Status for Non-Invasive Tumor: Negative Treatment Effect: No known presurgical therapy Regional Lymph Nodes:      Number of Lymph Nodes Involved: 1                           Nodal Sites with Tumor: Level 10      Number of Lymph Nodes Examined: 21                      Nodal Sites Examined: Levels 4, 5, 7, 9, 10, 11, 12 and 13 Distant Metastasis:       Distant Site(s) Involved: Not applicable Pathologic Stage Classification (pTNM, AJCC 8th Edition): pT1c, pN1 Ancillary Studies: Can be performed upon request Representative Tumor Block: B5 Comment(s): None    Lung cancer ([place])  12/05/2021 Initial Diagnosis   Lung cancer ([place])   12/18/2021 Cancer Staging   Staging form: Lung, AJCC 8th Edition - Pathologic stage from 12/18/2021: Stage IIB (pT1c, pN1, cM0) - Signed by [name], MD on 12/18/2021 Histopathologic type: Squamous cell carcinoma, NOS   Cancer of upper lobe of left lung ([place])  01/03/2022 Initial Diagnosis   Cancer of upper lobe of left lung ([place])   01/03/2022 Cancer Staging   Staging form: Lung, AJCC 8th Edition - Pathologic: Stage IIB (pT1c, pN1, cM0) - Signed by Cammie Sickle, MD on 01/03/2022   02/13/2022 -  Chemotherapy   Patient is on Treatment Plan : LUNG Cisplatin + Docetaxel q21d      HISTORY OF PRESENTING ILLNESS: Ambulating independently.  Accompanied by daughter.  [name] [name] 71 y.o.  female history of smoking with stage II T2 N1 squamous cell carcinoma left lung cancer is here to proceed with adjuvant chemotherapy with cisplatin and Taxotere.  Patient reports appetite is good, denies nausea or vomiting. Patient was able to pick up all pre-medications, she did not start dexamethasone yesterday.   In the interim patient did not go port placement.  Patient s/p chemotherapy education.  Patient denies any other needs or concerns at this time. Patient  is recovering well.  Denies any worsening pain.  Denies any worsening shortness of breath.  Review of Systems  Constitutional:  Negative for chills, diaphoresis, fever, malaise/fatigue and weight loss.  HENT:  Negative for nosebleeds and sore throat.   Eyes:  Negative for double vision.  Respiratory:  Negative for cough, hemoptysis, sputum production, shortness of breath and wheezing.   Cardiovascular:  Negative for chest pain, palpitations,  orthopnea and leg swelling.  Gastrointestinal:  Negative for abdominal pain, blood in stool, constipation, diarrhea, heartburn, melena, nausea and vomiting.  Genitourinary:  Negative for dysuria, frequency and urgency.  Musculoskeletal:  Negative for back pain and joint pain.  Skin: Negative.  Negative for itching and rash.  Neurological:  Negative for dizziness, tingling, focal weakness, weakness and headaches.  Endo/Heme/Allergies:  Does not bruise/bleed easily.  Psychiatric/Behavioral:  Negative for depression. The patient is not nervous/anxious and does not have insomnia.      MEDICAL HISTORY:  Past Medical History:  Diagnosis Date   Arthritis    OA   Carpal tunnel syndrome    Depression    Endometriosis    Fibromyalgia    Hyperlipidemia    Left upper lobe pulmonary nodule 11/2021   Post herpetic neuralgia    Sleep disorder    Tobacco abuse     SURGICAL HISTORY: Past Surgical History:  Procedure Laterality Date   ABDOMINAL HYSTERECTOMY  1992   total endometriosis   APPENDECTOMY  1992   bladder tack  1992   CATARACT EXTRACTION W/PHACO Right 03/25/2018   Procedure: CATARACT EXTRACTION PHACO AND INTRAOCULAR LENS PLACEMENT ([place]) RIGHT;  Surgeon: Marchia Meiers, MD;  Location: [facility] ORS;  Service: Ophthalmology;  Laterality: Right;  [place]  01:11 CDE 8.02 Fluid pack lot # [id number]   CATARACT EXTRACTION W/PHACO Left 04/22/2018   Procedure: CATARACT EXTRACTION PHACO AND INTRAOCULAR LENS PLACEMENT ([place]) Left Eye;  Surgeon: Marchia Meiers, MD;  Location: [facility] OR[facility];  Service: Ophthalmology;  Laterality: Left;  [place]  01:02 CDE 8.11 Fluid pack lot # [id number]   COLONOSCOPY     FOOT SURGERY Right 04/2016   Ong in [place] great toe; straightening others   INTERCOSTAL NERVE BLOCK Left 12/12/2021   Procedure: INTERCOSTAL NERVE BLOCK;  Surgeon: [name], MD;  Location: [facility];  Service: Thoracic;  Laterality: Left;   LYMPH NODE DISSECTION Left 12/12/2021    Procedure: LYMPH NODE DISSECTION;  Surgeon: [name], MD;  Location: [facility];  Service: Thoracic;  Laterality: Left;   VIDEO BRONCHOSCOPY N/A 12/16/2021   Procedure: VIDEO BRONCHOSCOPY;  Surgeon: [name], MD;  Location: [facility] OR;  Service: Thoracic;  Laterality: N/A;    SOCIAL HISTORY: Social History   Socioeconomic History   Marital status: Widowed    Spouse name: Not on file   Number of children: 2   Years of education: Not on file   Highest education level: Not on file  Occupational History   Not on file  Tobacco Use   Smoking status: Former    Packs/day: 1.00    Years: 55.00    Total pack years: 55.00    Types: Cigarettes    Quit date: 12/03/2021    Years since quitting: 0.1   Smokeless tobacco: Never  Vaping Use   Vaping Use: Some days  Substance and Sexual Activity   Alcohol use: No    Alcohol/week: 0.0 standard drinks of alcohol   Drug use: No   Sexual activity: Not Currently  Other Topics Concern  Not on file  Social History Narrative   Lives alone   Social Determinants of Health   Financial Resource Strain: Low Risk  (02/01/2021)   Overall Financial Resource Strain (CARDIA)    Difficulty of Paying Living Expenses: Not hard at all  Food Insecurity: No Food Insecurity (02/01/2021)   Hunger Vital Sign    Worried About Running Out of Food in the Last Year: Never true    Ran Out of Food in the Last Year: Never true  Transportation Needs: No Transportation Needs (02/01/2021)   PRAPARE - Hydrologist (Medical): No    Lack of Transportation (Non-Medical): No  Physical Activity: Inactive (02/01/2021)   Exercise Vital Sign    Days of Exercise per Week: 0 days    Minutes of Exercise per Session: 0 min  Stress: No Stress Concern Present (02/01/2021)   [place]    Feeling of Stress : Not at all  Social Connections: [place] (02/01/2021)    Social Connection and Isolation Panel [NHANES]    Frequency of Communication with Friends and Family: More than three times a week    Frequency of Social Gatherings with Friends and Family: More than three times a week    Attends Religious Services: More than 4 times per year    Active Member of [employer] or Organizations: Yes    Attends Music therapist: More than 4 times per year    Marital Status: Married  Human resources officer Violence: Not At Risk (02/01/2021)   Humiliation, Afraid, Rape, and Kick questionnaire    Fear of Current or Ex-Partner: No    Emotionally Abused: No    Physically Abused: No    Sexually Abused: No    FAMILY HISTORY: Family History  Problem Relation Age of Onset   Heart disease Mother        MI   Heart disease Father        MI   Arthritis Sister        crippling arthritis   Arthritis Brother        crippling arthritis    ALLERGIES:  has No Known Allergies.  MEDICATIONS:  Current Outpatient Medications  Medication Sig Dispense Refill   aspirin 81 MG tablet Take 81 mg by mouth daily.       busPIRone (BUSPAR) 15 MG tablet TAKE 1/2 TABLET BY MOUTH TWICE A DAY (Patient taking differently: Take 15 mg by mouth daily.) 90 tablet 2   Cobalamin Combinations (NEURIVA PLUS PO) Take by mouth.     DULoxetine (CYMBALTA) 60 MG capsule TAKE 1 CAPSULE BY MOUTH EVERY DAY 90 capsule 2   Fluticasone-Umeclidin-Vilant (TRELEGY ELLIPTA) 100-62.5-25 MCG/ACT AEPB Inhale 1 puff into the lungs daily. (Patient taking differently: Inhale 1 puff into the lungs daily as needed (shortness of breath).) 28 each 11   Melatonin 10 MG CAPS Take 20 mg by mouth at bedtime.     naproxen sodium (ALEVE) 220 MG tablet Take 440 mg by mouth daily as needed (headaches).     pregabalin (LYRICA) 75 MG capsule TAKE 1 CAPSULE BY MOUTH TWICE A DAY 180 capsule 1   traMADol (ULTRAM) 50 MG tablet TAKE 1 TABLET BY MOUTH EVERY 8 HOURS AS NEEDED. 90 tablet 0   dexamethasone (DECADRON) 4 MG tablet Take  1 tablet (4 mg total) by mouth 2 (two) times daily with a meal. Start 2 days prior to chemo. Take for 2 days; DO  NOT take on the day of chemo. (Patient not taking: Reported on 02/12/2022) 60 tablet 0   lidocaine-prilocaine (EMLA) cream Apply on the port. 30 -45 min  prior to port access. (Patient not taking: Reported on 02/12/2022) 30 g 3   ondansetron (ZOFRAN) 8 MG tablet One pill every 8 hours as needed for nausea/vomitting. (Patient not taking: Reported on 02/12/2022) 40 tablet 1   prochlorperazine (COMPAZINE) 10 MG tablet Take 1 tablet (10 mg total) by mouth every 6 (six) hours as needed for nausea or vomiting. (Patient not taking: Reported on 02/12/2022) 40 tablet 1   No current facility-administered medications for this visit.      Marland Kitchen  PHYSICAL EXAMINATION: ECOG PERFORMANCE STATUS: 0 - Asymptomatic  Vitals:   02/12/22 0908  BP: 131/70  Pulse: 61  Resp: 18  Temp: (!) 97 F (36.1 C)  SpO2: 100%   Filed Weights   02/12/22 0908  Weight: 176 lb 9.6 oz (80.1 kg)    Physical Exam Vitals and nursing note reviewed.  Constitutional:      Comments:     HENT:     Head: Normocephalic and atraumatic.     Mouth/Throat:     Mouth: Mucous membranes are moist.     Pharynx: No oropharyngeal exudate.  Eyes:     Pupils: Pupils are equal, round, and reactive to light.  Cardiovascular:     Rate and Rhythm: Normal rate and regular rhythm.  Pulmonary:     Effort: No respiratory distress.     Breath sounds: No wheezing.     Comments: Decreased breath sounds bilaterally at bases.  No wheeze or crackles Abdominal:     General: Bowel sounds are normal. There is no distension.     Palpations: Abdomen is soft. There is no mass.     Tenderness: There is no abdominal tenderness. There is no guarding or rebound.  Musculoskeletal:        General: No tenderness. Normal range of motion.     Cervical back: Normal range of motion and neck supple.  Skin:    General: Skin is warm.  Neurological:      Mental Status: She is alert and oriented to person, place, and time.  Psychiatric:        Mood and Affect: Affect normal.        Judgment: Judgment normal.      LABORATORY DATA:  I have reviewed the data as listed Lab Results  Component Value Date   WBC 5.8 02/12/2022   HGB 13.2 02/12/2022   HCT 40.5 02/12/2022   MCV 89.2 02/12/2022   PLT 248 02/12/2022   Recent Labs    12/10/21 1450 12/13/21 0102 12/14/21 0020 12/16/21 0018 12/19/21 0019 02/12/22 0846  NA 135   < > 136 134* 133* 137  K 4.3   < > 5.1 4.5 5.1 5.0  CL 106   < > 103 102 97* 104  CO2 24   < > _0 GLUCOSE 78   < > 96 108* 99 108*  BUN 10   < > _1 CREATININE 0.68   < > 0.99 0.63 0.74 0.63  CALCIUM 9.3   < > 9.0 8.9 9.2 9.4  GFRNONAA >60   < > >60 >60 >60 >60  PROT 6.5  --  5.6*  --   --  7.3  ALBUMIN 3.6  --  3.1*  --   --  3.8  AST 18  --  17  --   --  16  ALT 14  --  12  --   --  14  ALKPHOS 89  --  63  --   --  103  BILITOT 0.9  --  0.4  --   --  0.5   < > = values in this interval not displayed.    RADIOGRAPHIC STUDIES: I have personally reviewed the radiological images as listed and agreed with the findings in the report. DG Chest 2 View  Result Date: 01/14/2022 CLINICAL DATA:  History of lung cancer and left upper lobe lobectomy. EXAM: CHEST - 2 VIEW COMPARISON:  Multiple previous chest x-rays. FINDINGS: The cardiac silhouette, mediastinal and hilar contours are within normal limits. Persistent loss of volume in the left hemithorax and moderate-sized left pleural effusion. The right lung remains clear. IMPRESSION: Persistent loss of volume in the left hemithorax and moderate-sized left pleural effusion. Electronically Signed   By: Marijo Sanes M.D.   On: 01/14/2022 11:12    ASSESSMENT & PLAN:   Cancer of upper lobe of left lung ([place]) # Left upper lobe-lung cancer moderately differentiated squamous cell carcinoma-pT1pN1- [IIB]; PDL-1 =70%. EGFR status-WILD.   adjuvant  chemotherapy-cisplatin Taxotere x 4 cycles; followed by adjuvant Atezo/Pembrolizumab x1 year.    # proceed with  adjuvant chemotherapy cisplatin-Taxotere every 3 weeks cycle #1 of planned 4 treatments tomorrow.   Labs today reviewed;  acceptable for treatment.  Growth factor-Neulasta/On pro would be given as prophylaxis for chemotherapy-induced neutropenia to prevent febrile neutropenias.  Discussed regarding use of Claritin to help with joint pains.  I discussed the mechanism of action. Discussed the potential side effects of immunotherapy including but not limited to diarrhea; skin rash; elevated LFTs/endocrine abnormalities etc. Reminded of Dex day prior to treatment.   # DISPOSITION:  # chemo as planned tomorrow  # follow up in 1 week- APP;labs- cbc/bmp;mag; possible IVFs over 1 hour  # Follow up in 3 weeks-WED- MD; labs- cbc/cmp;mag; Thursday-D-2 Cispatin-Taxotere; FridayD-2 [name]- Dr.B     All questions were answered. The patient knows to call the clinic with any problems, questions or concerns.       Cammie Sickle, MD 02/12/2022 9:40 AM

## 2022-02-13 ENCOUNTER — Ambulatory Visit: Payer: Medicare Other | Admitting: Internal Medicine

## 2022-02-13 ENCOUNTER — Other Ambulatory Visit: Payer: Medicare Other

## 2022-02-13 ENCOUNTER — Inpatient Hospital Stay: Payer: Medicare Other

## 2022-02-13 ENCOUNTER — Encounter: Payer: Self-pay | Admitting: *Deleted

## 2022-02-13 VITALS — BP 105/68 | HR 69 | Temp 97.5°F | Resp 16

## 2022-02-13 DIAGNOSIS — Z902 Acquired absence of lung [part of]: Secondary | ICD-10-CM | POA: Diagnosis not present

## 2022-02-13 DIAGNOSIS — Z87891 Personal history of nicotine dependence: Secondary | ICD-10-CM | POA: Diagnosis not present

## 2022-02-13 DIAGNOSIS — C3412 Malignant neoplasm of upper lobe, left bronchus or lung: Secondary | ICD-10-CM

## 2022-02-13 DIAGNOSIS — Z5111 Encounter for antineoplastic chemotherapy: Secondary | ICD-10-CM | POA: Diagnosis not present

## 2022-02-13 DIAGNOSIS — Z5189 Encounter for other specified aftercare: Secondary | ICD-10-CM | POA: Diagnosis not present

## 2022-02-13 MED ORDER — SODIUM CHLORIDE 0.9 % IV SOLN
75.0000 mg/m2 | Freq: Once | INTRAVENOUS | Status: AC
  Administered 2022-02-13: 142 mg via INTRAVENOUS
  Filled 2022-02-13: qty 142

## 2022-02-13 MED ORDER — SODIUM CHLORIDE 0.9 % IV SOLN
Freq: Once | INTRAVENOUS | Status: AC
  Filled 2022-02-13: qty 250

## 2022-02-13 MED ORDER — SODIUM CHLORIDE 0.9 % IV SOLN
10.0000 mg | Freq: Once | INTRAVENOUS | Status: AC
  Administered 2022-02-13: 10 mg via INTRAVENOUS
  Filled 2022-02-13: qty 10

## 2022-02-13 MED ORDER — MAGNESIUM SULFATE 2 GM/50ML IV SOLN
2.0000 g | Freq: Once | INTRAVENOUS | Status: AC
  Administered 2022-02-13: 2 g via INTRAVENOUS
  Filled 2022-02-13: qty 50

## 2022-02-13 MED ORDER — POTASSIUM CHLORIDE IN NACL 20-0.9 MEQ/L-% IV SOLN
Freq: Once | INTRAVENOUS | Status: AC
  Filled 2022-02-13: qty 1000

## 2022-02-13 MED ORDER — SODIUM CHLORIDE 0.9 % IV SOLN
75.0000 mg/m2 | Freq: Once | INTRAVENOUS | Status: AC
  Administered 2022-02-13: 140 mg via INTRAVENOUS
  Filled 2022-02-13: qty 14

## 2022-02-13 MED ORDER — SODIUM CHLORIDE 0.9 % IV SOLN
150.0000 mg | Freq: Once | INTRAVENOUS | Status: AC
  Administered 2022-02-13: 150 mg via INTRAVENOUS
  Filled 2022-02-13: qty 150

## 2022-02-13 MED ORDER — PALONOSETRON HCL INJECTION 0.25 MG/5ML
0.2500 mg | Freq: Once | INTRAVENOUS | Status: AC
  Administered 2022-02-13: 0.25 mg via INTRAVENOUS
  Filled 2022-02-13: qty 5

## 2022-02-13 NOTE — Progress Notes (Signed)
Per Dr Rogue Bussing, ok to run post hydration fluids concurrently with cisplatin.

## 2022-02-13 NOTE — Patient Instructions (Signed)
[facility] CANCER CTR AT [place]  Discharge Instructions: Thank you for choosing [facility] to provide your oncology and hematology care.  If you have a lab appointment with the [facility], please go directly to the [facility] and check in at the registration area.  Wear comfortable clothing and clothing appropriate for easy access to any Portacath or PICC line.   We strive to give you quality time with your provider. You may need to reschedule your appointment if you arrive late (15 or more minutes).  Arriving late affects you and other patients whose appointments are after yours.  Also, if you miss three or more appointments without notifying the office, you may be dismissed from the clinic at the provider's discretion.      For prescription refill requests, have your pharmacy contact our office and allow 72 hours for refills to be completed.    Today you received the following chemotherapy and/or immunotherapy agents Taxotere & Cisplatin      To help prevent nausea and vomiting after your treatment, we encourage you to take your nausea medication as directed.  BELOW ARE SYMPTOMS THAT SHOULD BE REPORTED IMMEDIATELY: *FEVER GREATER THAN 100.4 F (38 C) OR HIGHER *CHILLS OR SWEATING *NAUSEA AND VOMITING THAT IS NOT CONTROLLED WITH YOUR NAUSEA MEDICATION *UNUSUAL SHORTNESS OF BREATH *UNUSUAL BRUISING OR BLEEDING *URINARY PROBLEMS (pain or burning when urinating, or frequent urination) *BOWEL PROBLEMS (unusual diarrhea, constipation, pain near the anus) TENDERNESS IN MOUTH AND THROAT WITH OR WITHOUT PRESENCE OF ULCERS (sore throat, sores in mouth, or a toothache) UNUSUAL RASH, SWELLING OR PAIN  UNUSUAL VAGINAL DISCHARGE OR ITCHING   Items with * indicate a potential emergency and should be followed up as soon as possible or go to the Emergency Department if any problems should occur.  Please show the CHEMOTHERAPY ALERT CARD or IMMUNOTHERAPY ALERT CARD at  check-in to the Emergency Department and triage nurse.  Should you have questions after your visit or need to cancel or reschedule your appointment, please contact [facility] CANCER [place] AT [place]  [phone] and follow the prompts.  Office hours are 8:00 a.m. to 4:30 p.m. Monday - Friday. Please note that voicemails left after 4:00 p.m. may not be returned until the following business day.  We are closed weekends and major holidays. You have access to a nurse at all times for urgent questions. Please call the main number to the clinic [phone] and follow the prompts.  For any non-urgent questions, you may also contact your provider using MyChart. We now offer e-Visits for anyone 30 and older to request care online for non-urgent symptoms. For details visit [email or web address].   Also download the MyChart app! Go to the app store, search "MyChart", open the app, select [facility], and log in with your MyChart username and password.  Masks are optional in the cancer centers. If you would like for your care team to wear a mask while they are taking care of you, please let them know. For doctor visits, patients may have with them one support person who is at least 72 years old. At this time, visitors are not allowed in the infusion area.

## 2022-02-13 NOTE — Progress Notes (Signed)
Met with patient in the infusion area. Reviewed upcoming appts. Discussed nutrition referral and pt is agreeable. All questions answered during visit. Instructed pt to call with any further questions or needs. Pt verbalized understanding.

## 2022-02-14 ENCOUNTER — Inpatient Hospital Stay: Payer: Medicare Other

## 2022-02-14 ENCOUNTER — Telehealth: Payer: Self-pay

## 2022-02-14 ENCOUNTER — Ambulatory Visit: Payer: Medicare Other

## 2022-02-14 VITALS — BP 172/85 | HR 77 | Resp 17

## 2022-02-14 DIAGNOSIS — C3412 Malignant neoplasm of upper lobe, left bronchus or lung: Secondary | ICD-10-CM | POA: Diagnosis not present

## 2022-02-14 DIAGNOSIS — Z5111 Encounter for antineoplastic chemotherapy: Secondary | ICD-10-CM | POA: Diagnosis not present

## 2022-02-14 DIAGNOSIS — Z5189 Encounter for other specified aftercare: Secondary | ICD-10-CM | POA: Diagnosis not present

## 2022-02-14 DIAGNOSIS — Z87891 Personal history of nicotine dependence: Secondary | ICD-10-CM | POA: Diagnosis not present

## 2022-02-14 DIAGNOSIS — Z902 Acquired absence of lung [part of]: Secondary | ICD-10-CM | POA: Diagnosis not present

## 2022-02-14 MED ORDER — PEGFILGRASTIM-CBQV 6 MG/0.6ML ~~LOC~~ SOSY
6.0000 mg | PREFILLED_SYRINGE | Freq: Once | SUBCUTANEOUS | Status: AC
  Administered 2022-02-14: 6 mg via SUBCUTANEOUS
  Filled 2022-02-14: qty 0.6

## 2022-02-14 NOTE — Patient Instructions (Signed)

## 2022-02-14 NOTE — Telephone Encounter (Signed)
Telephone call to patient for follow up after receiving first infusion.   Patient states infusion went great.  States eating good and drinking plenty of fluids.   Denies any nausea or vomiting.  Encouraged patient to call for any concerns or questions. 

## 2022-02-17 ENCOUNTER — Inpatient Hospital Stay (HOSPITAL_BASED_OUTPATIENT_CLINIC_OR_DEPARTMENT_OTHER): Payer: Medicare Other | Admitting: Medical Oncology

## 2022-02-17 ENCOUNTER — Other Ambulatory Visit: Payer: Self-pay

## 2022-02-17 ENCOUNTER — Telehealth: Payer: Self-pay | Admitting: *Deleted

## 2022-02-17 ENCOUNTER — Inpatient Hospital Stay: Payer: Medicare Other

## 2022-02-17 ENCOUNTER — Encounter: Payer: Self-pay | Admitting: Medical Oncology

## 2022-02-17 VITALS — BP 160/80 | HR 84 | Resp 16

## 2022-02-17 VITALS — BP 131/76 | HR 86 | Temp 96.7°F | Resp 18 | Ht 64.0 in | Wt 176.0 lb

## 2022-02-17 DIAGNOSIS — R112 Nausea with vomiting, unspecified: Secondary | ICD-10-CM | POA: Diagnosis not present

## 2022-02-17 DIAGNOSIS — E860 Dehydration: Secondary | ICD-10-CM

## 2022-02-17 DIAGNOSIS — C3412 Malignant neoplasm of upper lobe, left bronchus or lung: Secondary | ICD-10-CM | POA: Diagnosis not present

## 2022-02-17 DIAGNOSIS — Z902 Acquired absence of lung [part of]: Secondary | ICD-10-CM | POA: Diagnosis not present

## 2022-02-17 DIAGNOSIS — Z5189 Encounter for other specified aftercare: Secondary | ICD-10-CM | POA: Diagnosis not present

## 2022-02-17 DIAGNOSIS — Z87891 Personal history of nicotine dependence: Secondary | ICD-10-CM | POA: Diagnosis not present

## 2022-02-17 DIAGNOSIS — Z5111 Encounter for antineoplastic chemotherapy: Secondary | ICD-10-CM | POA: Diagnosis not present

## 2022-02-17 LAB — COMPREHENSIVE METABOLIC PANEL
ALT: 29 U/L (ref 0–44)
AST: 29 U/L (ref 15–41)
Albumin: 3.9 g/dL (ref 3.5–5.0)
Alkaline Phosphatase: 118 U/L (ref 38–126)
Anion gap: 12 (ref 5–15)
BUN: 18 mg/dL (ref 8–23)
CO2: 26 mmol/L (ref 22–32)
Calcium: 9.2 mg/dL (ref 8.9–10.3)
Chloride: 95 mmol/L — ABNORMAL LOW (ref 98–111)
Creatinine, Ser: 0.76 mg/dL (ref 0.44–1.00)
GFR, Estimated: >60 mL/min (ref >60)
Glucose, Bld: 156 mg/dL — ABNORMAL HIGH (ref 70–99)
Potassium: 3.9 mmol/L (ref 3.5–5.1)
Sodium: 133 mmol/L — ABNORMAL LOW (ref 135–145)
Total Bilirubin: 1.3 mg/dL — ABNORMAL HIGH (ref 0.3–1.2)
Total Protein: 7.6 g/dL (ref 6.5–8.1)

## 2022-02-17 LAB — CBC WITH DIFFERENTIAL/PLATELET
Abs Immature Granulocytes: 0.2 10*3/uL — ABNORMAL HIGH (ref 0.00–0.07)
Basophils Absolute: 0 10*3/uL (ref 0.0–0.1)
Basophils Relative: 0 %
Eosinophils Absolute: 0.1 10*3/uL (ref 0.0–0.5)
Eosinophils Relative: 1 %
HCT: 42.7 % (ref 36.0–46.0)
Hemoglobin: 14.8 g/dL (ref 12.0–15.0)
Lymphocytes Relative: 13 %
Lymphs Abs: 1.1 10*3/uL (ref 0.7–4.0)
MCH: 29.4 pg (ref 26.0–34.0)
MCHC: 34.7 g/dL (ref 30.0–36.0)
MCV: 84.9 fL (ref 80.0–100.0)
Metamyelocytes Relative: 1 %
Monocytes Absolute: 0.2 10*3/uL (ref 0.1–1.0)
Monocytes Relative: 2 %
Myelocytes: 1 %
Neutro Abs: 7.1 10*3/uL (ref 1.7–7.7)
Neutrophils Relative %: 82 %
Platelets: 156 10*3/uL (ref 150–400)
RBC: 5.03 MIL/uL (ref 3.87–5.11)
RDW: 12.8 % (ref 11.5–15.5)
Smear Review: NORMAL
WBC: 8.6 10*3/uL (ref 4.0–10.5)
nRBC: 0 % (ref 0.0–0.2)

## 2022-02-17 LAB — MAGNESIUM: Magnesium: 2.1 mg/dL (ref 1.7–2.4)

## 2022-02-17 MED ORDER — ONDANSETRON HCL 4 MG/2ML IJ SOLN
8.0000 mg | Freq: Once | INTRAMUSCULAR | Status: AC
  Administered 2022-02-17: 8 mg via INTRAVENOUS
  Filled 2022-02-17: qty 4

## 2022-02-17 MED ORDER — SODIUM CHLORIDE 0.9 % IV SOLN
INTRAVENOUS | Status: AC
  Filled 2022-02-17 (×2): qty 250

## 2022-02-17 NOTE — Progress Notes (Signed)
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   Reason for Consult: [name] [name] is a 71 y.o. female who presents today for:  Nausea/vomiting: Patient reports that for the past 3 days she has had nausea/vomiting, decreased appetite, general aches and pains, headache. She was vomiting every time she eat or tried to take medications. This improved as of yesterday when they started scheduling her zofran/compazine. Then she was able to hold down a bite or two of soft foods and few sips of liquids. She denies any coffee ground or bloody appearance to her vomit. No diarrhea/constipation/abdominal pain. She has tried compazine for symptoms with little relief. Last dose was last night around 11 pm. She feels like she is dehydrated.   Denies any neurologic complaints. Denies recent fevers or illnesses. Denies any easy bleeding or bruising. Denies chest pain. Denies constipation, or diarrhea. Denies urinary complaints. Patient offers no further specific complaints today.  Wt Readings from Last 3 Encounters:  02/17/22 176 lb (79.8 kg)  02/12/22 176 lb 9.6 oz (80.1 kg)  01/30/22 174 lb 1.6 oz (79 kg)     PAST MEDICAL HISTORY: Past Medical History:  Diagnosis Date   Arthritis    OA   Carpal tunnel syndrome    Depression    Endometriosis    Fibromyalgia    Hyperlipidemia    Left upper lobe pulmonary nodule 11/2021   Post herpetic neuralgia    Sleep disorder    Tobacco abuse     PAST SURGICAL HISTORY:  Past Surgical History:  Procedure  Laterality Date   ABDOMINAL HYSTERECTOMY  1992   total endometriosis   APPENDECTOMY  1992   bladder tack  1992   CATARACT EXTRACTION W/PHACO Right 03/25/2018   Procedure: CATARACT EXTRACTION PHACO AND INTRAOCULAR LENS PLACEMENT ([place]) RIGHT;  Surgeon: Marchia Meiers, MD;  Location: [facility];  Service: Ophthalmology;  Laterality: Right;  [place]  01:11 CDE 8.02 Fluid pack lot # [id number]   CATARACT EXTRACTION W/PHACO Left 04/22/2018   Procedure: CATARACT EXTRACTION PHACO AND INTRAOCULAR LENS PLACEMENT ([place]) Left Eye;  Surgeon: Marchia Meiers, MD;  Location: [facility];  Service: Ophthalmology;  Laterality: Left;  [place]  01:02 CDE 8.11 Fluid pack lot # [id number]   COLONOSCOPY     FOOT SURGERY Right 04/2016   [facility] in [place] great toe; straightening others   INTERCOSTAL NERVE BLOCK Left 12/12/2021   Procedure: INTERCOSTAL NERVE BLOCK;  Surgeon: [name], MD;  Location: [place];  Service: Thoracic;  Laterality: Left;   LYMPH NODE DISSECTION Left 12/12/2021   Procedure: LYMPH NODE DISSECTION;  Surgeon: [name], MD;  Location: [place];  Service: Thoracic;  Laterality: Left;   VIDEO BRONCHOSCOPY N/A 12/16/2021   Procedure: VIDEO BRONCHOSCOPY;  Surgeon: [name], MD;  Location: [facility];  Service: Thoracic;  Laterality: N/A;    HEMATOLOGY/ONCOLOGY HISTORY:  Oncology History Overview Note  JULY 2023- 1. 10 mm mean diameter nodule in the left upper lobe adjacent to an area of pleural and parenchymal scarring.     SEP 2023-  1. Hypermetabolic spiculated nodular left upper lobe pulmonary lesion is compatible with primary bronchogenic neoplasm. 2. Mildly metabolic nodularity in the left hilum, without discrete adenopathy may reflect early nodal disease involvement. Suggest attention on follow-up imaging. 3. No evidence of distant hypermetabolic metastatic disease. 4. Hypermetabolic nodule in the left lobe of the thyroid, Recommend thyroid US and biopsy (ref:  J Am Coll Radiol. 2015 Feb;12(2): 143-50). 5. Aortic Atherosclerosis (ICD10-I70.0) and Emphysema (ICD10-J43.9).  # LUNG: Resection  Synchronous Tumors: Not applicable Total Number of Primary Tumors: 1 Procedure: Lobectomy, lung Specimen Laterality: Left Tumor Focality: Unifocal Tumor Site: Upper lobe Tumor Size: 2.6 cm Histologic Type: Squamous cell carcinoma, moderately differentiated Visceral Pleura Invasion: Not identified Direct Invasion of Adjacent Structures: No adjacent structures present Lymphovascular Invasion: Not identified Margins: All margins negative for invasive carcinoma      Closest Margin(s) to Invasive Carcinoma: Bronchovascular margin      Margin(s) Involved by Invasive Carcinoma: Not applicable       Margin Status for Non-Invasive Tumor: Negative Treatment Effect: No known presurgical therapy Regional Lymph Nodes:      Number of Lymph Nodes Involved: 1                           Nodal Sites with Tumor: Level 10      Number of Lymph Nodes Examined: 21                      Nodal Sites Examined: Levels 4, 5, 7, 9, 10, 11, 12 and 13 Distant Metastasis:      Distant Site(s) Involved: Not applicable Pathologic Stage Classification (pTNM, AJCC 8th Edition): pT1c, pN1 Ancillary Studies: Can be performed upon request Representative Tumor Block: B5 Comment(s): None  # DEC 14th, 2023- Cisplatin-Taxotere #1; D-2 [name] x4 cycles    Lung cancer ([place])  12/05/2021 Initial Diagnosis   Lung cancer ([place])   12/18/2021 Cancer Staging   Staging form: Lung, AJCC 8th Edition - Pathologic stage from 12/18/2021: Stage IIB (pT1c, pN1, cM0) - Signed by [name], MD on 12/18/2021 Histopathologic type: Squamous cell carcinoma, NOS   Cancer of upper lobe of left lung ([place])  01/03/2022 Initial Diagnosis   Cancer of upper lobe of left lung ([place])   01/03/2022 Cancer Staging   Staging form: Lung, AJCC 8th Edition - Pathologic: Stage IIB (pT1c, pN1, cM0) - Signed by  Cammie Sickle, MD on 01/03/2022   02/13/2022 -  Chemotherapy   Patient is on Treatment Plan : LUNG Cisplatin + Docetaxel q21d       ALLERGIES:  has No Known Allergies.  MEDICATIONS:  Current Outpatient Medications  Medication Sig Dispense Refill   aspirin 81 MG tablet Take 81 mg by mouth daily.   (Patient not taking: Reported on 02/17/2022)     busPIRone (BUSPAR) 15 MG tablet TAKE 1/2 TABLET BY MOUTH TWICE A DAY (Patient not taking: Reported on 02/17/2022) 90 tablet 2   Cobalamin Combinations (NEURIVA PLUS PO) Take by mouth. (Patient not taking: Reported on 02/17/2022)     dexamethasone (DECADRON) 4 MG tablet Take 1 tablet (4 mg total) by mouth 2 (two) times daily with a meal. Start 2 days prior to chemo. Take for 2 days; DO NOT take on the day of chemo. (Patient not taking: Reported on 02/12/2022) 60 tablet 0   DULoxetine (CYMBALTA) 60 MG capsule TAKE 1 CAPSULE BY MOUTH EVERY DAY (Patient not taking: Reported on 02/17/2022)  90 capsule 2   Fluticasone-Umeclidin-Vilant (TRELEGY ELLIPTA) 100-62.5-25 MCG/ACT AEPB Inhale 1 puff into the lungs daily. (Patient not taking: Reported on 02/17/2022) 28 each 11   lidocaine-prilocaine (EMLA) cream Apply on the port. 30 -45 min  prior to port access. (Patient not taking: Reported on 02/12/2022) 30 g 3   Melatonin 10 MG CAPS Take 20 mg by mouth at bedtime. (Patient not taking: Reported on 02/17/2022)     naproxen sodium (ALEVE) 220 MG tablet Take 440 mg by mouth daily as needed (headaches). (Patient not taking: Reported on 02/17/2022)     ondansetron (ZOFRAN) 8 MG tablet One pill every 8 hours as needed for nausea/vomitting. (Patient not taking: Reported on 02/12/2022) 40 tablet 1   pregabalin (LYRICA) 75 MG capsule TAKE 1 CAPSULE BY MOUTH TWICE A DAY (Patient not taking: Reported on 02/17/2022) 180 capsule 1   prochlorperazine (COMPAZINE) 10 MG tablet Take 1 tablet (10 mg total) by mouth every 6 (six) hours as needed for nausea or vomiting. (Patient  not taking: Reported on 02/12/2022) 40 tablet 1   traMADol (ULTRAM) 50 MG tablet TAKE 1 TABLET BY MOUTH EVERY 8 HOURS AS NEEDED. 90 tablet 0   No current facility-administered medications for this visit.   Facility-Administered Medications Ordered in Other Visits  Medication Dose Route Frequency Provider Last Rate Last Admin   0.9 %  sodium chloride infusion   Intravenous Continuous Hughie Closs, PA-C   Stopped at 02/17/22 1214    VITAL SIGNS: BP 131/76   Pulse 86   Temp (!) 96.7 F (35.9 C) (Tympanic)   Resp 18   Ht 5\' 4"  (1.626 m)   Wt 176 lb (79.8 kg)   SpO2 96%   BMI 30.21 kg/m  Filed Weights   02/17/22 1030  Weight: 176 lb (79.8 kg)    Estimated body mass index is 30.21 kg/m as calculated from the following:   Height as of this encounter: 5\' 4"  (1.626 m).   Weight as of this encounter: 176 lb (79.8 kg).  LABS: CBC:    Component Value Date/Time   WBC 8.6 02/17/2022 0956   HGB 14.8 02/17/2022 0956   HCT 42.7 02/17/2022 0956   PLT 156 02/17/2022 0956   MCV 84.9 02/17/2022 0956   NEUTROABS 7.1 02/17/2022 0956   LYMPHSABS 1.1 02/17/2022 0956   MONOABS 0.2 02/17/2022 0956   EOSABS 0.1 02/17/2022 0956   BASOSABS 0.0 02/17/2022 0956   Comprehensive Metabolic Panel:    Component Value Date/Time   NA 133 (L) 02/17/2022 0956   K 3.9 02/17/2022 0956   CL 95 (L) 02/17/2022 0956   CO2 26 02/17/2022 0956   BUN 18 02/17/2022 0956   CREATININE 0.76 02/17/2022 0956   GLUCOSE 156 (H) 02/17/2022 0956   CALCIUM 9.2 02/17/2022 0956   AST 29 02/17/2022 0956   ALT 29 02/17/2022 0956   ALKPHOS 118 02/17/2022 0956   BILITOT 1.3 (H) 02/17/2022 0956   PROT 7.6 02/17/2022 0956   ALBUMIN 3.9 02/17/2022 0956    RADIOGRAPHIC STUDIES: No results found.  PERFORMANCE STATUS (ECOG) : 1 - Symptomatic but completely ambulatory  Review of Systems Unless otherwise noted, a complete review of systems is negative.  Physical Exam General: NAD Cardiovascular: regular rate and  rhythm Pulmonary: clear ant fields Abdomen: soft, nontender, + bowel sounds GU: no suprapubic tenderness Extremities: no edema, no joint deformities Skin: no rashes Neurological: Weakness but otherwise nonfocal  Assessment and Plan- Patient is a 71 y.o. female  Encounter Diagnoses  Name Primary?   Dehydration Yes   Cancer of upper lobe of left lung (HCC)    Nausea and vomiting, unspecified vomiting type    Symptomatic likely from her chemotherapy and Udenyca. Also was not able to hold down Claritin which is likely contributing. Discussed with patient and her daughter who accompanies her today. She was given 8 mg of zofran and 1L IVF. Feeling much better thereafter. This is reassuring.Given her labs today showing some hyponatremia and her clinical improvement with IVF I will see her again tomorrow for labs and consideration of additional fluids. They have antiemetics on hand to use as directed- scheduled encouraged. May consider decadron tomorrow. She will take Claritin when she gets home and continue daily. She will stick to a bland diet if possible. She will likely benefit from fluids the day she receives her [place] and the next business day. Scheduled antiemetics would also help in the beginning along with her Claritin.     Patient expressed understanding and was in agreement with this plan. She also understands that She can call clinic at any time with any questions, concerns, or complaints.   Thank you for allowing me to participate in the care of this very pleasant patient.   Time Total: 25  Visit consisted of counseling and education dealing with the complex and emotionally intense issues of symptom management in the setting of serious illness.Greater than 50%  of this time was spent counseling and coordinating care related to the above assessment and plan.  Signed by: Nelwyn Salisbury, PA-C

## 2022-02-17 NOTE — Telephone Encounter (Signed)
Patient in clinic to see [facility]

## 2022-02-17 NOTE — Telephone Encounter (Signed)
Daughter called reporting that patient has been vomiting all weekend and the nausea medicine is not working. She spoke with On Call last evening and was told we would call early this morning to come in for IV fluids. Please advise

## 2022-02-18 ENCOUNTER — Inpatient Hospital Stay: Payer: Medicare Other

## 2022-02-18 ENCOUNTER — Inpatient Hospital Stay (HOSPITAL_BASED_OUTPATIENT_CLINIC_OR_DEPARTMENT_OTHER): Payer: Medicare Other | Admitting: Nurse Practitioner

## 2022-02-18 ENCOUNTER — Other Ambulatory Visit: Payer: Self-pay | Admitting: Radiology

## 2022-02-18 ENCOUNTER — Encounter: Payer: Self-pay | Admitting: Nurse Practitioner

## 2022-02-18 VITALS — BP 138/72 | HR 72 | Temp 97.0°F | Resp 18 | Ht 64.0 in | Wt 176.0 lb

## 2022-02-18 DIAGNOSIS — T451X5A Adverse effect of antineoplastic and immunosuppressive drugs, initial encounter: Secondary | ICD-10-CM | POA: Diagnosis not present

## 2022-02-18 DIAGNOSIS — Z87891 Personal history of nicotine dependence: Secondary | ICD-10-CM | POA: Diagnosis not present

## 2022-02-18 DIAGNOSIS — K5900 Constipation, unspecified: Secondary | ICD-10-CM

## 2022-02-18 DIAGNOSIS — C3412 Malignant neoplasm of upper lobe, left bronchus or lung: Secondary | ICD-10-CM

## 2022-02-18 DIAGNOSIS — R112 Nausea with vomiting, unspecified: Secondary | ICD-10-CM

## 2022-02-18 DIAGNOSIS — Z09 Encounter for follow-up examination after completed treatment for conditions other than malignant neoplasm: Secondary | ICD-10-CM | POA: Diagnosis not present

## 2022-02-18 DIAGNOSIS — Z902 Acquired absence of lung [part of]: Secondary | ICD-10-CM | POA: Diagnosis not present

## 2022-02-18 DIAGNOSIS — Z5189 Encounter for other specified aftercare: Secondary | ICD-10-CM | POA: Diagnosis not present

## 2022-02-18 DIAGNOSIS — E860 Dehydration: Secondary | ICD-10-CM

## 2022-02-18 DIAGNOSIS — Z5111 Encounter for antineoplastic chemotherapy: Secondary | ICD-10-CM | POA: Diagnosis not present

## 2022-02-18 LAB — CBC WITH DIFFERENTIAL/PLATELET
Abs Immature Granulocytes: 0.08 10*3/uL — ABNORMAL HIGH (ref 0.00–0.07)
Basophils Absolute: 0.1 10*3/uL (ref 0.0–0.1)
Basophils Relative: 2 %
Eosinophils Absolute: 0.1 10*3/uL (ref 0.0–0.5)
Eosinophils Relative: 2 %
HCT: 39 % (ref 36.0–46.0)
Hemoglobin: 13.5 g/dL (ref 12.0–15.0)
Immature Granulocytes: 2 %
Lymphocytes Relative: 45 %
Lymphs Abs: 2 10*3/uL (ref 0.7–4.0)
MCH: 29.3 pg (ref 26.0–34.0)
MCHC: 34.6 g/dL (ref 30.0–36.0)
MCV: 84.8 fL (ref 80.0–100.0)
Monocytes Absolute: 0.1 10*3/uL (ref 0.1–1.0)
Monocytes Relative: 3 %
Neutro Abs: 2.1 10*3/uL (ref 1.7–7.7)
Neutrophils Relative %: 46 %
Platelets: 131 10*3/uL — ABNORMAL LOW (ref 150–400)
RBC: 4.6 MIL/uL (ref 3.87–5.11)
RDW: 12.8 % (ref 11.5–15.5)
WBC: 4.4 10*3/uL (ref 4.0–10.5)
nRBC: 0 % (ref 0.0–0.2)

## 2022-02-18 LAB — COMPREHENSIVE METABOLIC PANEL
ALT: 24 U/L (ref 0–44)
AST: 21 U/L (ref 15–41)
Albumin: 3.8 g/dL (ref 3.5–5.0)
Alkaline Phosphatase: 101 U/L (ref 38–126)
Anion gap: 8 (ref 5–15)
BUN: 20 mg/dL (ref 8–23)
CO2: 25 mmol/L (ref 22–32)
Calcium: 9.1 mg/dL (ref 8.9–10.3)
Chloride: 103 mmol/L (ref 98–111)
Creatinine, Ser: 0.63 mg/dL (ref 0.44–1.00)
GFR, Estimated: >60 mL/min (ref >60)
Glucose, Bld: 114 mg/dL — ABNORMAL HIGH (ref 70–99)
Potassium: 3.7 mmol/L (ref 3.5–5.1)
Sodium: 136 mmol/L (ref 135–145)
Total Bilirubin: 0.8 mg/dL (ref 0.3–1.2)
Total Protein: 7.1 g/dL (ref 6.5–8.1)

## 2022-02-18 LAB — MAGNESIUM: Magnesium: 2 mg/dL (ref 1.7–2.4)

## 2022-02-18 MED ORDER — ONDANSETRON HCL 4 MG/2ML IJ SOLN
8.0000 mg | Freq: Once | INTRAMUSCULAR | Status: AC
  Administered 2022-02-18: 8 mg via INTRAVENOUS
  Filled 2022-02-18: qty 4

## 2022-02-18 MED ORDER — SODIUM CHLORIDE 0.9 % IV SOLN
INTRAVENOUS | Status: DC
  Filled 2022-02-18 (×2): qty 250

## 2022-02-18 MED ORDER — SODIUM CHLORIDE 0.9 % IV SOLN
10.0000 mg | Freq: Once | INTRAVENOUS | Status: AC
  Administered 2022-02-18: 10 mg via INTRAVENOUS
  Filled 2022-02-18: qty 10

## 2022-02-18 NOTE — Patient Instructions (Signed)
It is common for patients who are undergoing treatment and taking certain prescribed medications to experience side-effects with constipation.  If you experience constipation, please take stool softeners such as Senna and/or Miralax every day to avoid constipation.  These medications are available over the counter.  Of course, if you have diarrhea, stop taking stool softeners.  Drinking plenty of fluid, eating fruits and vegetable, and being active also reduces the risk of constipation.   If despite taking stool softeners, and you still have no bowel movement for 2 days or more than your normal bowel habit frequency, please take one of the following over the counter laxatives:  Milk of Magnesia or Mag Citrate everyday and contact me immediately for further instructions.  The goal is to have at least one bowel movement every day or every other day without pain or straining.   Due to your nausea, I also recommend that you use a suppository to help soften stool. If no effect, you can also use an enema. These are available over the counter.

## 2022-02-18 NOTE — Progress Notes (Signed)
Symptom Management [name] [name] at [facility] [address] [place], [place] [address] [phone] (phone) [phone] (fax)  Patient Care Team: Tower, [name] Fanny, MD as PCP - General [name] Nab, RN as Oncology Nurse Navigator   Name of the patient: [name] [name]  BX:[id number]  03/22/1950   Date of visit: 02/18/22  Diagnosis- Lung Cancer  Chief complaint/ Reason for visit- nausea, vomiting, constipation  Heme/Onc history:  Oncology History Overview Note  JULY 2023- 1. 10 mm mean diameter nodule in the left upper lobe adjacent to an area of pleural and parenchymal scarring.     SEP 2023- 1. Hypermetabolic spiculated nodular left upper lobe pulmonary lesion is compatible with primary bronchogenic neoplasm. 2. Mildly metabolic nodularity in the left hilum, without discrete adenopathy may reflect early nodal disease involvement. Suggest attention on follow-up imaging. 3. No evidence of distant hypermetabolic metastatic disease. 4. Hypermetabolic nodule in the left lobe of the thyroid, Recommend thyroid US and biopsy (ref: J Am Coll Radiol. 2015 Feb;12(2): 143-50). 5. Aortic Atherosclerosis (ICD10-I70.0) and Emphysema (ICD10-J43.9).  # LUNG: Resection  Synchronous Tumors: Not applicable Total Number of Primary Tumors: 1 Procedure: Lobectomy, lung Specimen Laterality: Left Tumor Focality: Unifocal Tumor Site: Upper lobe Tumor Size: 2.6 cm Histologic Type: Squamous cell carcinoma, moderately differentiated Visceral Pleura Invasion: Not identified Direct Invasion of Adjacent Structures: No adjacent structures present Lymphovascular Invasion: Not identified Margins: All margins negative for invasive carcinoma      Closest Margin(s) to Invasive Carcinoma: Bronchovascular margin      Margin(s) Involved by Invasive Carcinoma: Not applicable       Margin Status for  Non-Invasive Tumor: Negative Treatment Effect: No known presurgical therapy Regional Lymph Nodes:      Number of Lymph Nodes Involved: 1                           Nodal Sites with Tumor: Level 10      Number of Lymph Nodes Examined: 21                      Nodal Sites Examined: Levels 4, 5, 7, 9, 10, 11, 12 and 13 Distant Metastasis:      Distant Site(s) Involved: Not applicable Pathologic Stage Classification (pTNM, AJCC 8th Edition): pT1c, pN1 Ancillary Studies: Can be performed upon request Representative Tumor Block: B5 Comment(s): None  # DEC 14th, 2023- Cisplatin-Taxotere #1; D-2 udenyca x4 cycles    Lung cancer ([place])  12/05/2021 Initial Diagnosis   Lung cancer ([place])   12/18/2021 Cancer Staging   Staging form: Lung, AJCC 8th Edition - Pathologic stage from 12/18/2021: Stage IIB (pT1c, pN1, cM0) - Signed by [name] Nakayama, MD on 12/18/2021 Histopathologic type: Squamous cell carcinoma, NOS   Cancer of upper lobe of left lung ([place])  01/03/2022 Initial Diagnosis   Cancer of upper lobe of left lung ([place])   01/03/2022 Cancer Staging   Staging form: Lung, AJCC 8th Edition - Pathologic: Stage IIB (pT1c, pN1, cM0) - Signed by [name] Sickle, MD on 01/03/2022   02/13/2022 -  Chemotherapy   Patient is on Treatment Plan : LUNG Cisplatin + Docetaxel q21d       Interval history- [name] [name] is a 71 y.o. female currently undergoing chemotherapy for lung cancer who presents to [facility] for complaints of nausea, vomiting, and constipation. She  received chemo with cisplatin & docetaxel on 12/14 and describes vomiting all weekend. She was seen by PA yesterday and received IVF and zofran which helped. Had small BM this morning that was hard. Continues to be nauseous. Not eating much.   Review of systems- Review of Systems  Constitutional:  Positive for malaise/fatigue. Negative for chills, fever and weight loss.  HENT:  Negative for hearing loss,  nosebleeds, sore throat and tinnitus.   Eyes:  Negative for blurred vision and double vision.  Respiratory:  Negative for cough, hemoptysis, shortness of breath and wheezing.   Cardiovascular:  Negative for chest pain, palpitations and leg swelling.  Gastrointestinal:  Positive for constipation and nausea. Negative for abdominal pain, blood in stool, diarrhea, melena and vomiting.  Genitourinary:  Negative for dysuria and urgency.  Musculoskeletal:  Negative for back pain, falls, joint pain and myalgias.  Skin:  Negative for itching and rash.  Neurological:  Negative for dizziness, tingling, sensory change, loss of consciousness, weakness and headaches.  Endo/Heme/Allergies:  Negative for environmental allergies. Does not bruise/bleed easily.  Psychiatric/Behavioral:  Negative for depression. The patient is not nervous/anxious and does not have insomnia.      No Known Allergies  Past Medical History:  Diagnosis Date   Arthritis    OA   Carpal tunnel syndrome    Depression    Endometriosis    Fibromyalgia    Hyperlipidemia    Left upper lobe pulmonary nodule 11/2021   Post herpetic neuralgia    Sleep disorder    Tobacco abuse     Past Surgical History:  Procedure Laterality Date   ABDOMINAL HYSTERECTOMY  1992   total endometriosis   APPENDECTOMY  1992   bladder tack  1992   CATARACT EXTRACTION W/PHACO Right 03/25/2018   Procedure: CATARACT EXTRACTION PHACO AND INTRAOCULAR LENS PLACEMENT ([place]) RIGHT;  Surgeon: [name] Meiers, MD;  Location: [facility] ORS;  Service: Ophthalmology;  Laterality: Right;  [place]  01:11 CDE 8.02 Fluid pack lot # [id number]   CATARACT EXTRACTION W/PHACO Left 04/22/2018   Procedure: CATARACT EXTRACTION PHACO AND INTRAOCULAR LENS PLACEMENT ([place]) Left Eye;  Surgeon: [name] Meiers, MD;  Location: [facility] OR[facility];  Service: Ophthalmology;  Laterality: Left;  [place]  01:02 CDE 8.11 Fluid pack lot # [id number]   COLONOSCOPY     FOOT SURGERY Right 04/2016   [place] in  [place] great toe; straightening others   INTERCOSTAL NERVE BLOCK Left 12/12/2021   Procedure: INTERCOSTAL NERVE BLOCK;  Surgeon: [name] Nakayama, MD;  Location: [facility];  Service: Thoracic;  Laterality: Left;   LYMPH NODE DISSECTION Left 12/12/2021   Procedure: LYMPH NODE DISSECTION;  Surgeon: [name] Nakayama, MD;  Location: [facility];  Service: Thoracic;  Laterality: Left;   VIDEO BRONCHOSCOPY N/A 12/16/2021   Procedure: VIDEO BRONCHOSCOPY;  Surgeon: [name] Nakayama, MD;  Location: [facility] OR;  Service: Thoracic;  Laterality: N/A;    Social History   Socioeconomic History   Marital status: Widowed    Spouse name: Not on file   Number of children: 2   Years of education: Not on file   Highest education level: Not on file  Occupational History   Not on file  Tobacco Use   Smoking status: Former    Packs/day: 1.00    Years: 55.00    Total pack years: 55.00    Types: Cigarettes    Quit date: 12/03/2021    Years since quitting: 0.2   Smokeless tobacco: Never  Vaping Use  Vaping Use: Some days  Substance and Sexual Activity   Alcohol use: No    Alcohol/week: 0.0 standard drinks of alcohol   Drug use: No   Sexual activity: Not Currently  Other Topics Concern   Not on file  Social History Narrative   Lives alone   Social Determinants of Health   Financial Resource Strain: Low Risk  (02/01/2021)   Overall Financial Resource Strain (CARDIA)    Difficulty of Paying Living Expenses: Not hard at all  Food Insecurity: No Food Insecurity (02/01/2021)   Hunger Vital Sign    Worried About Running Out of Food in the Last Year: Never true    Ran Out of Food in the Last Year: Never true  Transportation Needs: No Transportation Needs (02/01/2021)   PRAPARE - Hydrologist (Medical): No    Lack of Transportation (Non-Medical): No  Physical Activity: Inactive (02/01/2021)   Exercise Vital Sign    Days of Exercise per Week: 0 days    Minutes of  Exercise per Session: 0 min  Stress: No Stress Concern Present (02/01/2021)   [place]    Feeling of Stress : Not at all  Social Connections: [place] (02/01/2021)   Social Connection and Isolation Panel [NHANES]    Frequency of Communication with Friends and Family: More than three times a week    Frequency of Social Gatherings with Friends and Family: More than three times a week    Attends Religious Services: More than 4 times per year    Active Member of [employer] or Organizations: Yes    Attends Music therapist: More than 4 times per year    Marital Status: Married  Human resources officer Violence: Not At Risk (02/01/2021)   Humiliation, Afraid, Rape, and Kick questionnaire    Fear of Current or Ex-Partner: No    Emotionally Abused: No    Physically Abused: No    Sexually Abused: No    Family History  Problem Relation Age of Onset   Heart disease Mother        MI   Heart disease Father        MI   Arthritis Sister        crippling arthritis   Arthritis Brother        crippling arthritis     Current Outpatient Medications:    acetaminophen (TYLENOL) 500 MG tablet, Take 500 mg by mouth every 6 (six) hours as needed., Disp: , Rfl:    ondansetron (ZOFRAN) 8 MG tablet, One pill every 8 hours as needed for nausea/vomitting., Disp: 40 tablet, Rfl: 1   prochlorperazine (COMPAZINE) 10 MG tablet, Take 1 tablet (10 mg total) by mouth every 6 (six) hours as needed for nausea or vomiting., Disp: 40 tablet, Rfl: 1   aspirin 81 MG tablet, Take 81 mg by mouth daily.   (Patient not taking: Reported on 02/17/2022), Disp: , Rfl:    busPIRone (BUSPAR) 15 MG tablet, TAKE 1/2 TABLET BY MOUTH TWICE A DAY (Patient not taking: Reported on 02/17/2022), Disp: 90 tablet, Rfl: 2   Cobalamin Combinations (NEURIVA PLUS PO), Take by mouth. (Patient not taking: Reported on 02/17/2022), Disp: , Rfl:    dexamethasone (DECADRON)  4 MG tablet, Take 1 tablet (4 mg total) by mouth 2 (two) times daily with a meal. Start 2 days prior to chemo. Take for 2 days; DO NOT take on the day of chemo. (Patient  not taking: Reported on 02/12/2022), Disp: 60 tablet, Rfl: 0   DULoxetine (CYMBALTA) 60 MG capsule, TAKE 1 CAPSULE BY MOUTH EVERY DAY (Patient not taking: Reported on 02/17/2022), Disp: 90 capsule, Rfl: 2   Fluticasone-Umeclidin-Vilant (TRELEGY ELLIPTA) 100-62.5-25 MCG/ACT AEPB, Inhale 1 puff into the lungs daily. (Patient not taking: Reported on 02/17/2022), Disp: 28 each, Rfl: 11   lidocaine-prilocaine (EMLA) cream, Apply on the port. 30 -45 min  prior to port access. (Patient not taking: Reported on 02/12/2022), Disp: 30 g, Rfl: 3   Melatonin 10 MG CAPS, Take 20 mg by mouth at bedtime. (Patient not taking: Reported on 02/17/2022), Disp: , Rfl:    naproxen sodium (ALEVE) 220 MG tablet, Take 440 mg by mouth daily as needed (headaches). (Patient not taking: Reported on 02/17/2022), Disp: , Rfl:    pregabalin (LYRICA) 75 MG capsule, TAKE 1 CAPSULE BY MOUTH TWICE A DAY (Patient not taking: Reported on 02/17/2022), Disp: 180 capsule, Rfl: 1   traMADol (ULTRAM) 50 MG tablet, TAKE 1 TABLET BY MOUTH EVERY 8 HOURS AS NEEDED. (Patient not taking: Reported on 02/18/2022), Disp: 90 tablet, Rfl: 0 No current facility-administered medications for this visit.  Facility-Administered Medications Ordered in Other Visits:    0.9 %  sodium chloride infusion, , Intravenous, Continuous, Covington, Sarah M, PA-C, Stopped at 02/17/22 1214  Physical exam:  Vitals:   02/18/22 0930  BP: 138/72  Pulse: 72  Resp: 18  Temp: (!) 97 F (36.1 C)  TempSrc: Tympanic  Weight: 176 lb (79.8 kg)  Height: 5\' 4"  (1.626 m)   Physical Exam Constitutional:      General: She is not in acute distress.    Appearance: She is well-developed. She is not ill-appearing.  HENT:     Head: Atraumatic.     Nose: Nose normal.     Mouth/Throat:     Pharynx: No  oropharyngeal exudate.  Eyes:     General: No scleral icterus.    Conjunctiva/sclera: Conjunctivae normal.  Cardiovascular:     Rate and Rhythm: Normal rate and regular rhythm.  Pulmonary:     Effort: Pulmonary effort is normal.     Breath sounds: Normal breath sounds.  Abdominal:     General: Bowel sounds are normal. There is no distension.     Palpations: Abdomen is soft.     Tenderness: There is no abdominal tenderness.  Musculoskeletal:        General: No tenderness or deformity. Normal range of motion.     Cervical back: Normal range of motion and neck supple.  Skin:    General: Skin is warm and dry.     Coloration: Skin is not pale.  Neurological:     General: No focal deficit present.     Mental Status: She is alert and oriented to person, place, and time.  Psychiatric:        Mood and Affect: Mood normal.        Behavior: Behavior normal.         Latest Ref Rng & Units 02/18/2022    9:03 AM  CMP  Glucose 70 - 99 mg/dL 114   BUN 8 - 23 mg/dL 20   Creatinine 0.44 - 1.00 mg/dL 0.63   Sodium 135 - 145 mmol/L 136   Potassium 3.5 - 5.1 mmol/L 3.7   Chloride 98 - 111 mmol/L 103   CO2 22 - 32 mmol/L 25   Calcium 8.9 - 10.3 mg/dL 9.1   Total Protein 6.5 - 8.1  g/dL 7.1   Total Bilirubin 0.3 - 1.2 mg/dL 0.8   Alkaline Phos 38 - 126 U/L 101   AST 15 - 41 U/L 21   ALT 0 - 44 U/L 24       Latest Ref Rng & Units 02/18/2022    9:03 AM  CBC  WBC 4.0 - 10.5 K/uL 4.4   Hemoglobin 12.0 - 15.0 g/dL 13.5   Hematocrit 36.0 - 46.0 % 39.0   Platelets 150 - 400 K/uL 131     No images are attached to the encounter.  No results found.  Assessment and plan- Patient is a 71 y.o. female   Nausea & Vomiting- secondary to chemotherapy. Plan for IV fluids, decadron 10 mg and zofran 8 mg IV in clinic today. Patient was able to tolerate orals and can be discharged home. Reviewed use of home antiemetics.  Headache- likely related to fluid volume deficit. Tylenol per otc directions,  not to exceed 3000 mg in 24 hour period.  Constipation- antiemetics as part of premeds contributing. IV fluids should help. Stay hydrated and reviewed bowel management plan. Once nausea and vomiting has improved, can use milk of magnesia or mag citrate for constipation. If not able to tolerate orals, ok to use suppository or enema. Caution in future though based on risk of neutropenia with chemotherapy. Hold suppository or enema if ANC < 1500. Once acute constipation resolves, plan for bowel prophylaxis with senna and miralax. Should she develop ongoing vomiting, constipation, diarrhea or abdominal pain, recommend imaging for possible SBO.  Stage IIB Squamous cell carcinoma of left lung- s/p cycle 1 of cisplatin-docetaxel with udenyca support on 02/14/22.   Disposition:  Fluids, steroids, and zofran IV today RTC if symptoms don't improve or worsen, otherwise, follow up as scheduled - la   Visit Diagnosis No diagnosis found.  Patient expressed understanding and was in agreement with this plan. She also understands that She can call clinic at any time with any questions, concerns, or complaints.   Thank you for allowing me to participate in the care of this very pleasant patient.   Beckey Rutter, DNP, AGNP-C [place] [facility]  CC:

## 2022-02-19 ENCOUNTER — Other Ambulatory Visit: Payer: Self-pay | Admitting: Radiology

## 2022-02-19 ENCOUNTER — Inpatient Hospital Stay: Payer: Medicare Other

## 2022-02-19 ENCOUNTER — Encounter: Payer: Self-pay | Admitting: Nurse Practitioner

## 2022-02-19 ENCOUNTER — Inpatient Hospital Stay: Payer: Medicare Other | Admitting: Oncology

## 2022-02-19 ENCOUNTER — Encounter: Payer: Self-pay | Admitting: *Deleted

## 2022-02-19 ENCOUNTER — Ambulatory Visit: Payer: Medicare Other

## 2022-02-20 ENCOUNTER — Ambulatory Visit
Admission: RE | Admit: 2022-02-20 | Discharge: 2022-02-20 | Disposition: A | Discharge status: Home / Self Care | Payer: Medicare Other | Source: Ambulatory Visit | Attending: Internal Medicine | Admitting: Internal Medicine

## 2022-02-20 DIAGNOSIS — Z902 Acquired absence of lung [part of]: Secondary | ICD-10-CM | POA: Diagnosis not present

## 2022-02-20 DIAGNOSIS — C3490 Malignant neoplasm of unspecified part of unspecified bronchus or lung: Secondary | ICD-10-CM | POA: Diagnosis not present

## 2022-02-25 ENCOUNTER — Encounter: Payer: Self-pay | Admitting: Nurse Practitioner

## 2022-02-26 NOTE — Progress Notes (Signed)
Patient for IR Port Insertion on Thurs 02/27/2022, I called and left a VM for the patient on the phone and gave pre-procedure instructions. The VM stated that the patient be here at 12:30p at the new entrance, NPO after MN prior to procedure as well as driver post procedure/recovery/discharge. Called 02/26/2022

## 2022-02-27 ENCOUNTER — Ambulatory Visit
Admission: RE | Admit: 2022-02-27 | Discharge: 2022-02-27 | Disposition: A | Discharge status: Home / Self Care | Payer: Medicare Other | Source: Ambulatory Visit | Attending: Internal Medicine | Admitting: Internal Medicine

## 2022-02-27 ENCOUNTER — Ambulatory Visit (INDEPENDENT_AMBULATORY_CARE_PROVIDER_SITE_OTHER): Payer: Medicare Other | Admitting: Family Medicine

## 2022-02-27 ENCOUNTER — Encounter: Payer: Self-pay | Admitting: Family Medicine

## 2022-02-27 VITALS — BP 128/80 | HR 80 | Temp 98.7°F | Ht 64.0 in | Wt 178.1 lb

## 2022-02-27 DIAGNOSIS — R300 Dysuria: Secondary | ICD-10-CM

## 2022-02-27 DIAGNOSIS — C3490 Malignant neoplasm of unspecified part of unspecified bronchus or lung: Secondary | ICD-10-CM | POA: Diagnosis not present

## 2022-02-27 DIAGNOSIS — Z902 Acquired absence of lung [part of]: Secondary | ICD-10-CM

## 2022-02-27 DIAGNOSIS — Z87891 Personal history of nicotine dependence: Secondary | ICD-10-CM | POA: Insufficient documentation

## 2022-02-27 DIAGNOSIS — N390 Urinary tract infection, site not specified: Secondary | ICD-10-CM

## 2022-02-27 DIAGNOSIS — C3492 Malignant neoplasm of unspecified part of left bronchus or lung: Secondary | ICD-10-CM | POA: Diagnosis not present

## 2022-02-27 DIAGNOSIS — Z452 Encounter for adjustment and management of vascular access device: Secondary | ICD-10-CM | POA: Diagnosis not present

## 2022-02-27 HISTORY — PX: IR IMAGING GUIDED PORT INSERTION: IMG5740

## 2022-02-27 LAB — POC URINALSYSI DIPSTICK (AUTOMATED)
Bilirubin, UA: NEGATIVE
Glucose, UA: NEGATIVE
Ketones, UA: NEGATIVE
Nitrite, UA: POSITIVE
Protein, UA: NEGATIVE
Spec Grav, UA: 1.01 (ref 1.010–1.025)
Urobilinogen, UA: 0.2 E.U./dL
pH, UA: 7 (ref 5.0–8.0)

## 2022-02-27 MED ORDER — HEPARIN SOD (PORK) LOCK FLUSH 100 UNIT/ML IV SOLN
INTRAVENOUS | Status: AC
  Filled 2022-02-27: qty 5

## 2022-02-27 MED ORDER — LIDOCAINE-EPINEPHRINE 1 %-1:100000 IJ SOLN
INTRAMUSCULAR | Status: AC
  Administered 2022-02-27: 15 mL
  Filled 2022-02-27: qty 1

## 2022-02-27 MED ORDER — SULFAMETHOXAZOLE-TRIMETHOPRIM 400-80 MG PO TABS: 1.0000 | ORAL_TABLET | Freq: Two times a day (BID) | ORAL | 0 refills | Status: DC

## 2022-02-27 MED ORDER — HEPARIN SOD (PORK) LOCK FLUSH 100 UNIT/ML IV SOLN
INTRAVENOUS | Status: AC
  Administered 2022-02-27: 500 [IU]
  Filled 2022-02-27: qty 5

## 2022-02-27 MED ORDER — FENTANYL CITRATE (PF) 100 MCG/2ML IJ SOLN
INTRAMUSCULAR | Status: AC | PRN
  Administered 2022-02-27 (×2): 50 ug via INTRAVENOUS

## 2022-02-27 MED ORDER — MIDAZOLAM HCL 5 MG/5ML IJ SOLN
INTRAMUSCULAR | Status: AC | PRN
  Administered 2022-02-27: 1 mg via INTRAVENOUS

## 2022-02-27 MED ORDER — SODIUM CHLORIDE 0.9 % IV SOLN: INTRAVENOUS | Status: DC

## 2022-02-27 MED ORDER — FENTANYL CITRATE (PF) 100 MCG/2ML IJ SOLN
INTRAMUSCULAR | Status: AC
  Filled 2022-02-27: qty 2

## 2022-02-27 MED ORDER — MIDAZOLAM HCL 2 MG/2ML IJ SOLN
INTRAMUSCULAR | Status: AC | PRN
  Administered 2022-02-27: 1 mg via INTRAVENOUS

## 2022-02-27 MED ORDER — MIDAZOLAM HCL 2 MG/2ML IJ SOLN
INTRAMUSCULAR | Status: AC
  Filled 2022-02-27: qty 2

## 2022-02-27 NOTE — Progress Notes (Signed)
[name] [name] T. [name] Posner, MD, [facility] at [facility] [place], [address]  Phone: [phone]  FAX: [phone]  [name] [name] - 70 y.o. female  MRN [id number]  Date of Birth: [birth year]  Date: 02/27/2022  PCP: Abner Greenspan, MD  Referral: Abner Greenspan, MD  Chief Complaint  Patient presents with   Burning with Urination   Subjective:   [name] [name] is a 71 y.o. very pleasant female patient with Body mass index is 30.58 kg/m. who presents with the following:  Pleasant patient presents with dysuria.  Ongoing treatment for lung cancer.  I remember her well having found some lung nodules earlier in the year on a shoulder x-ray.  She presents with acute urgency and dysuria.  Is been present over the last few days.  She has not seen any gross blood.  She denies any CVA tenderness or abdominal tenderness  Review of Systems is noted in the HPI, as appropriate  Objective:   BP 128/80   Pulse 80   Temp 98.7 F (37.1 C) (Oral)   Ht 5\' 4"  (1.626 m)   Wt 178 lb 2 oz (80.8 kg)   SpO2 97%   BMI 30.58 kg/m    GEN: WDWN HEENT: Atraumatc, normocephalic. CV: RRR, No M/G/R PULM: CTA B, No wheezes, crackles, or rhonchi ABD: S, NT, ND, +BS, no rebound. No CVAT. No suprapubic tenderness.   Laboratory and Imaging Data: Results for orders placed or performed in visit on 02/27/22  POCT Urinalysis Dipstick (Automated)  Result Value Ref Range   Color, UA Yellow    Clarity, UA Cloudy    Glucose, UA Negative Negative   Bilirubin, UA Negative    Ketones, UA Negative    Spec Grav, UA 1.010 1.010 - 1.025   Blood, UA Small    pH, UA 7.0 5.0 - 8.0   Protein, UA Negative Negative   Urobilinogen, UA 0.2 0.2 or 1.0 E.U./dL   Nitrite, UA Positive    Leukocytes, UA Large (3+) (A) Negative     Assessment and Plan:     ICD-10-CM   1. Acute lower UTI  N39.0     2. Dysuria  R30.0 POCT Urinalysis Dipstick (Automated)     Urine Culture     History and UA are highly suggestive.  We will obtain a culture and treat for urinary tract infection  Medication Management during today's office visit: Meds ordered this encounter  Medications   sulfamethoxazole-trimethoprim (BACTRIM) 400-80 MG tablet    Sig: Take 1 tablet by mouth 2 (two) times daily.    Dispense:  14 tablet    Refill:  0   There are no discontinued medications.  Orders placed today for conditions managed today: Orders Placed This Encounter  Procedures   Urine Culture   POCT Urinalysis Dipstick (Automated)    Disposition: No follow-ups on file.  Dragon [employer] One speech-to-text software was used for transcription in this dictation.  Possible transcriptional errors can occur using Editor, commissioning.   Signed,  [name] Deed. Glori Machnik, MD   Outpatient Encounter Medications as of 02/27/2022  Medication Sig   acetaminophen (TYLENOL) 500 MG tablet Take 500 mg by mouth every 6 (six) hours as needed.   aspirin 81 MG tablet Take 81 mg by mouth daily.   busPIRone (BUSPAR) 15 MG tablet TAKE 1/2 TABLET BY MOUTH TWICE A DAY   Cobalamin Combinations (NEURIVA PLUS PO) Take by mouth.  dexamethasone (DECADRON) 4 MG tablet Take 1 tablet (4 mg total) by mouth 2 (two) times daily with a meal. Start 2 days prior to chemo. Take for 2 days; DO NOT take on the day of chemo.   DULoxetine (CYMBALTA) 60 MG capsule TAKE 1 CAPSULE BY MOUTH EVERY DAY   Fluticasone-Umeclidin-Vilant (TRELEGY ELLIPTA) 100-62.5-25 MCG/ACT AEPB Inhale 1 puff into the lungs daily.   lidocaine-prilocaine (EMLA) cream Apply on the port. 30 -45 min  prior to port access.   Melatonin 10 MG CAPS Take 20 mg by mouth at bedtime.   naproxen sodium (ALEVE) 220 MG tablet Take 440 mg by mouth daily as needed (headaches).   ondansetron (ZOFRAN) 8 MG tablet One pill every 8 hours as needed for nausea/vomitting.   pregabalin (LYRICA) 75 MG capsule TAKE 1 CAPSULE BY MOUTH TWICE A DAY    prochlorperazine (COMPAZINE) 10 MG tablet Take 1 tablet (10 mg total) by mouth every 6 (six) hours as needed for nausea or vomiting.   sulfamethoxazole-trimethoprim (BACTRIM) 400-80 MG tablet Take 1 tablet by mouth 2 (two) times daily.   traMADol (ULTRAM) 50 MG tablet TAKE 1 TABLET BY MOUTH EVERY 8 HOURS AS NEEDED.   Facility-Administered Encounter Medications as of 02/27/2022  Medication   0.9 %  sodium chloride infusion

## 2022-02-27 NOTE — Progress Notes (Signed)
Patient clinically stable post Port placement per Dr Dwaine Gale, tolerated well. Vitals stable pre and post procedure. Received Versed 2 mg along with Fentanyl 100 mcg IV for procedure. Report given to Genelle Bal RN post procedure/331

## 2022-02-27 NOTE — Discharge Instructions (Signed)
Implanted Port Home Guide  An implanted port is a type of central line that is placed under the skin. Central lines are used to provide IV access when treatment or nutrition needs to be given through a person's veins. Implanted ports are used for long-term IV access. An implanted port may be placed because: You need IV medicine that would be irritating to the small veins in your hands or arms. You need long-term IV medicines, such as antibiotics. You need IV nutrition for a long period. You need frequent blood draws for lab tests. You need dialysis.   Implanted ports are usually placed in the chest area, but they can also be placed in the upper arm, the abdomen, or the leg. An implanted port has two main parts: Reservoir. The reservoir is round and will appear as a small, raised area under your skin. The reservoir is the part where a needle is inserted to give medicines or draw blood. Catheter. The catheter is a thin, flexible tube that extends from the reservoir. The catheter is placed into a large vein. Medicine that is inserted into the reservoir goes into the catheter and then into the vein.   How will I care for my incision  You may shower tomorrow  How is my port accessed? Special steps must be taken to access the port: Before the port is accessed, a numbing cream can be placed on the skin. This helps numb the skin over the port site. Your health care provider uses a sterile technique to access the port. Your health care provider must put on a mask and sterile gloves. The skin over your port is cleaned carefully with an antiseptic and allowed to dry. The port is gently pinched between sterile gloves, and a needle is inserted into the port. Only "non-coring" port needles should be used to access the port. Once the port is accessed, a blood return should be checked. This helps ensure that the port is in the vein and is not clogged. If your port needs to remain accessed for a constant  infusion, a clear (transparent) bandage will be placed over the needle site. The bandage and needle will need to be changed every week, or as directed by your health care provider.   What is flushing? Flushing helps keep the port from getting clogged. Follow your health care provider's instructions on how and when to flush the port. Ports are usually flushed with saline solution or a medicine called heparin. The need for flushing will depend on how the port is used. If the port is used for intermittent medicines or blood draws, the port will need to be flushed: After medicines have been given. After blood has been drawn. As part of routine maintenance. If a constant infusion is running, the port may not need to be flushed.   How long will my port stay implanted? The port can stay in for as long as your health care provider thinks it is needed. When it is time for the port to come out, surgery will be done to remove it. The procedure is similar to the one performed when the port was put in. When should I seek immediate medical care? When you have an implanted port, you should seek immediate medical care if: You notice a bad smell coming from the incision site. You have swelling, redness, or drainage at the incision site. You have more swelling or pain at the port site or the surrounding area. You have a fever that   is not controlled with medicine.   This information is not intended to replace advice given to you by your health care provider. Make sure you discuss any questions you have with your health care provider. Document Released: 02/17/2005 Document Revised: 07/26/2015 Document Reviewed: 10/25/2012 Elsevier Interactive Patient Education  2017 [employer]    

## 2022-02-27 NOTE — Consult Note (Signed)
Chief Complaint: Chest port placement  Referring Physician(s): Brahmanday,Govinda R  Patient Status: [facility] - Out-pt  History of Present Illness: [name] [name] is a 71 y.o. 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  She feels generally well today.  She is eager to have the port placed as it has been difficult having the chemo administered through peripheral IV.  She denies fever, chills, SOB, and Chest pain.  Past Medical History:  Diagnosis Date   Arthritis    OA   Carpal tunnel syndrome    Depression    Endometriosis    Fibromyalgia    Hyperlipidemia    Left upper lobe pulmonary nodule 11/2021   Post herpetic neuralgia    Sleep disorder    Tobacco abuse     Past Surgical History:  Procedure Laterality Date   ABDOMINAL HYSTERECTOMY  1992   total endometriosis   APPENDECTOMY  1992   bladder tack  1992   CATARACT EXTRACTION W/PHACO Right 03/25/2018   Procedure: CATARACT EXTRACTION PHACO AND INTRAOCULAR LENS PLACEMENT ([place]) RIGHT;  Surgeon: Marchia Meiers, MD;  Location: [facility] ORS;  Service: Ophthalmology;  Laterality: Right;  [place]  01:11 CDE 8.02 Fluid pack lot # [id number]   CATARACT EXTRACTION W/PHACO Left 04/22/2018   Procedure: CATARACT EXTRACTION PHACO AND INTRAOCULAR LENS PLACEMENT ([place]) Left Eye;  Surgeon: Marchia Meiers, MD;  Location: [facility] OR[facility];  Service: Ophthalmology;  Laterality: Left;  [place]  01:02 CDE 8.11 Fluid pack lot # [id number]   COLONOSCOPY     FOOT SURGERY Right 04/2016   [place] in [place] great toe; straightening others   INTERCOSTAL NERVE BLOCK Left 12/12/2021   Procedure: INTERCOSTAL NERVE BLOCK;  Surgeon: [name], MD;  Location: [place];  Service: Thoracic;  Laterality: Left;   LYMPH NODE DISSECTION Left 12/12/2021   Procedure: LYMPH NODE DISSECTION;  Surgeon: [name], MD;  Location: [place];  Service: Thoracic;  Laterality: Left;   VIDEO BRONCHOSCOPY N/A  12/16/2021   Procedure: VIDEO BRONCHOSCOPY;  Surgeon: [name], MD;  Location: [facility] OR;  Service: Thoracic;  Laterality: N/A;    Allergies: Patient has no known allergies.  Medications: Prior to Admission medications   Medication Sig Start Date End Date Taking? Authorizing Provider  acetaminophen (TYLENOL) 500 MG tablet Take 500 mg by mouth every 6 (six) hours as needed.   Yes [provider]  aspirin 81 MG tablet Take 81 mg by mouth daily.   Yes [provider]  busPIRone (BUSPAR) 15 MG tablet TAKE 1/2 TABLET BY MOUTH TWICE A DAY 06/10/21  Yes Tower, [name], MD  Cobalamin Combinations (NEURIVA PLUS PO) Take by mouth.   Yes [provider]  dexamethasone (DECADRON) 4 MG tablet Take 1 tablet (4 mg total) by mouth 2 (two) times daily with a meal. Start 2 days prior to chemo. Take for 2 days; DO NOT take on the day of chemo. 01/30/22  Yes Cammie Sickle, MD  DULoxetine (CYMBALTA) 60 MG capsule TAKE 1 CAPSULE BY MOUTH EVERY DAY 06/10/21  Yes Tower, [name], MD  Fluticasone-Umeclidin-Vilant (TRELEGY ELLIPTA) 100-62.5-25 MCG/ACT AEPB Inhale 1 puff into the lungs daily. 10/29/21  Yes Tyler Pita, MD  Melatonin 10 MG CAPS Take 20 mg by mouth at bedtime.   Yes [provider]  naproxen sodium (ALEVE) 220 MG tablet Take 440 mg by mouth daily as needed (headaches).   Yes [provider]  ondansetron (ZOFRAN) 8 MG tablet One pill every 8 hours as needed for nausea/vomitting. 01/30/22  Yes Cammie Sickle, MD  pregabalin (LYRICA) 75 MG capsule TAKE 1 CAPSULE BY MOUTH TWICE A DAY 01/16/22  Yes Tower, [name], MD  prochlorperazine (COMPAZINE) 10 MG tablet Take 1 tablet (10 mg total) by mouth every 6 (six) hours as needed for nausea or vomiting. 01/30/22  Yes Cammie Sickle, MD  sulfamethoxazole-trimethoprim (BACTRIM) 400-80 MG tablet Take 1 tablet by mouth 2 (two) times daily. 02/27/22  Yes Copland, Frederico Hamman, MD  traMADol  (ULTRAM) 50 MG tablet TAKE 1 TABLET BY MOUTH EVERY 8 HOURS AS NEEDED. 01/16/22  Yes Tower, [name], MD  lidocaine-prilocaine (EMLA) cream Apply on the port. 30 -45 min  prior to port access. 01/30/22   Cammie Sickle, MD     Family History  Problem Relation Age of Onset   Heart disease Mother        MI   Heart disease Father        MI   Arthritis Sister        crippling arthritis   Arthritis Brother        crippling arthritis    Social History   Socioeconomic History   Marital status: Widowed    Spouse name: Not on file   Number of children: 2   Years of education: Not on file   Highest education level: Not on file  Occupational History   Not on file  Tobacco Use   Smoking status: Former    Packs/day: 1.00    Years: 55.00    Total pack years: 55.00    Types: Cigarettes    Quit date: 12/03/2021    Years since quitting: 0.2   Smokeless tobacco: Never  Vaping Use   Vaping Use: Some days  Substance and Sexual Activity   Alcohol use: No    Alcohol/week: 0.0 standard drinks of alcohol   Drug use: No   Sexual activity: Not Currently  Other Topics Concern   Not on file  Social History Narrative   Lives alone   Social Determinants of Health   Financial Resource Strain: Low Risk  (02/01/2021)   Overall Financial Resource Strain (CARDIA)    Difficulty of Paying Living Expenses: Not hard at all  Food Insecurity: No Food Insecurity (02/01/2021)   Hunger Vital Sign    Worried About Running Out of Food in the Last Year: Never true    [place] in the Last Year: Never true  Transportation Needs: No Transportation Needs (02/18/2022)   PRAPARE - Hydrologist (Medical): No    Lack of Transportation (Non-Medical): No  Physical Activity: Inactive (02/01/2021)   Exercise Vital Sign    Days of Exercise per Week: 0 days    Minutes of Exercise per Session: 0 min  Stress: No Stress Concern Present (02/01/2021)   [place]    Feeling of Stress : Not at all  Social Connections: [place] (02/01/2021)   Social Connection and Isolation Panel [NHANES]    Frequency of Communication with Friends and Family: More than three times a week    Frequency of Social Gatherings with Friends and Family: More than three times a week    Attends Religious Services: More than 4 times per year    Active Member of [employer] or Organizations: Yes    Attends Archivist Meetings: More  than 4 times per year    Marital Status: Married   Review of Systems: A 12 point ROS discussed and pertinent positives are indicated in the HPI above.  All other systems are negative.  Review of Systems  Vital Signs: BP 124/80   Pulse 80   Temp 98.4 F (36.9 C)   Resp 20   Ht 5\' 4"  (1.626 m)   Wt 80 kg   SpO2 98%   BMI 30.27 kg/m   Advance Care Plan: The advanced care plan/surrogate decision maker was discussed at the time of visit and the patient did not wish to discuss or was not able to name a surrogate decision maker or provide an advance care plan.    Physical Exam Constitutional:      Appearance: Normal appearance.  Cardiovascular:     Rate and Rhythm: Normal rate and regular rhythm.  Pulmonary:     Effort: Pulmonary effort is normal.     Breath sounds: Normal breath sounds.  Abdominal:     General: Abdomen is flat.     Palpations: Abdomen is soft.  Skin:    General: Skin is warm and dry.  Neurological:     Mental Status: She is alert.  Psychiatric:        Mood and Affect: Mood normal.        Behavior: Behavior normal.     Imaging: No results found.  Labs:  CBC: Recent Labs    12/19/21 0019 02/12/22 0846 02/17/22 0956 02/18/22 0903  WBC 7.7 5.8 8.6 4.4  HGB 11.0* 13.2 14.8 13.5  HCT 31.8* 40.5 42.7 39.0  PLT 250 248 156 131*    COAGS: Recent Labs    12/10/21 1450  INR 1.0  APTT 26    BMP: Recent Labs    12/19/21 0019  02/12/22 0846 02/17/22 0956 02/18/22 0903  NA 133* 137 133* 136  K 5.1 5.0 3.9 3.7  CL 97* 104 95* 103  CO2 29 27 26 25   GLUCOSE 99 108* 156* 114*  BUN 15 8 18 20   CALCIUM 9.2 9.4 9.2 9.1  CREATININE 0.74 0.63 0.76 0.63  GFRNONAA >60 >60 >60 >60    LIVER FUNCTION TESTS: Recent Labs    12/14/21 0020 02/12/22 0846 02/17/22 0956 02/18/22 0903  BILITOT 0.4 0.5 1.3* 0.8  AST 17 16 29 21   ALT 12 14 29 24   ALKPHOS 63 103 118 101  PROT 5.6* 7.3 7.6 7.1  ALBUMIN 3.1* 3.8 3.9 3.8    TUMOR MARKERS: No results for input(s): "AFPTM", "CEA", "CA199", "CHROMGRNA" in the last 8760 hours.  Assessment and Plan:  71 year old woman with history of Stage IIB squamous cell carcinoma of left lung presents to IR for chest port placement.  Risks and benefits of image guided port-a-catheter placement was discussed with the patient including, but not limited to bleeding, infection, pneumothorax, or fibrin sheath development and need for additional procedures.  All of the patient's questions were answered, patient is agreeable to proceed. Consent signed and in chart.  Thank you for this interesting consult.  I greatly enjoyed meeting [name] [name] and look forward to participating in their care.  A copy of this report was sent to the requesting provider on this date.  Electronically Signed: Paula Libra Leandrew Keech, MD 02/27/2022, 1:38 PM   I spent a total of  15 Minutes  in face to face in clinical consultation, greater than 50% of which was counseling/coordinating care for Chest [facility] Placement.

## 2022-02-27 NOTE — Procedures (Signed)
Interventional Radiology Procedure Note  Procedure: Port placement.  Indication: Lung Ca.  Findings: Please refer to procedural dictation for full description.  Complications: None  EBL: < 10 mL  Miachel Roux, MD [phone]

## 2022-02-28 ENCOUNTER — Ambulatory Visit: Payer: Medicare Other | Admitting: Pulmonary Disease

## 2022-03-03 LAB — URINE CULTURE
MICRO NUMBER:: 14365439
SPECIMEN QUALITY:: ADEQUATE

## 2022-03-05 ENCOUNTER — Ambulatory Visit: Payer: Medicare Other

## 2022-03-05 ENCOUNTER — Inpatient Hospital Stay (HOSPITAL_BASED_OUTPATIENT_CLINIC_OR_DEPARTMENT_OTHER): Payer: Medicare Other | Admitting: Internal Medicine

## 2022-03-05 ENCOUNTER — Encounter: Payer: Self-pay | Admitting: Internal Medicine

## 2022-03-05 ENCOUNTER — Inpatient Hospital Stay: Payer: Medicare Other | Attending: Internal Medicine

## 2022-03-05 VITALS — BP 157/79 | HR 70 | Temp 98.5°F | Resp 18 | Wt 178.2 lb

## 2022-03-05 DIAGNOSIS — Z902 Acquired absence of lung [part of]: Secondary | ICD-10-CM | POA: Insufficient documentation

## 2022-03-05 DIAGNOSIS — F17210 Nicotine dependence, cigarettes, uncomplicated: Secondary | ICD-10-CM | POA: Diagnosis not present

## 2022-03-05 DIAGNOSIS — Z5111 Encounter for antineoplastic chemotherapy: Secondary | ICD-10-CM | POA: Diagnosis not present

## 2022-03-05 DIAGNOSIS — C3412 Malignant neoplasm of upper lobe, left bronchus or lung: Secondary | ICD-10-CM | POA: Diagnosis not present

## 2022-03-05 DIAGNOSIS — Z5189 Encounter for other specified aftercare: Secondary | ICD-10-CM | POA: Insufficient documentation

## 2022-03-05 DIAGNOSIS — R112 Nausea with vomiting, unspecified: Secondary | ICD-10-CM | POA: Diagnosis not present

## 2022-03-05 LAB — CBC WITH DIFFERENTIAL/PLATELET
Abs Immature Granulocytes: 0.69 10*3/uL — ABNORMAL HIGH (ref 0.00–0.07)
Basophils Absolute: 0.1 10*3/uL (ref 0.0–0.1)
Basophils Relative: 0 %
Eosinophils Absolute: 0 10*3/uL (ref 0.0–0.5)
Eosinophils Relative: 0 %
HCT: 32.9 % — ABNORMAL LOW (ref 36.0–46.0)
Hemoglobin: 11.1 g/dL — ABNORMAL LOW (ref 12.0–15.0)
Immature Granulocytes: 4 %
Lymphocytes Relative: 8 %
Lymphs Abs: 1.2 10*3/uL (ref 0.7–4.0)
MCH: 29.6 pg (ref 26.0–34.0)
MCHC: 33.7 g/dL (ref 30.0–36.0)
MCV: 87.7 fL (ref 80.0–100.0)
Monocytes Absolute: 0.5 10*3/uL (ref 0.1–1.0)
Monocytes Relative: 3 %
Neutro Abs: 13.3 10*3/uL — ABNORMAL HIGH (ref 1.7–7.7)
Neutrophils Relative %: 85 %
Platelets: 424 10*3/uL — ABNORMAL HIGH (ref 150–400)
RBC: 3.75 MIL/uL — ABNORMAL LOW (ref 3.87–5.11)
RDW: 14.6 % (ref 11.5–15.5)
WBC: 15.7 10*3/uL — ABNORMAL HIGH (ref 4.0–10.5)
nRBC: 0 % (ref 0.0–0.2)

## 2022-03-05 LAB — COMPREHENSIVE METABOLIC PANEL
ALT: 11 U/L (ref 0–44)
AST: 17 U/L (ref 15–41)
Albumin: 3.6 g/dL (ref 3.5–5.0)
Alkaline Phosphatase: 79 U/L (ref 38–126)
Anion gap: 8 (ref 5–15)
BUN: 14 mg/dL (ref 8–23)
CO2: 23 mmol/L (ref 22–32)
Calcium: 9.1 mg/dL (ref 8.9–10.3)
Chloride: 104 mmol/L (ref 98–111)
Creatinine, Ser: 0.82 mg/dL (ref 0.44–1.00)
GFR, Estimated: >60 mL/min (ref >60)
Glucose, Bld: 165 mg/dL — ABNORMAL HIGH (ref 70–99)
Potassium: 4.5 mmol/L (ref 3.5–5.1)
Sodium: 135 mmol/L (ref 135–145)
Total Bilirubin: 0.4 mg/dL (ref 0.3–1.2)
Total Protein: 7 g/dL (ref 6.5–8.1)

## 2022-03-05 LAB — MAGNESIUM: Magnesium: 2.2 mg/dL (ref 1.7–2.4)

## 2022-03-05 MED FILL — Fosaprepitant Dimeglumine For IV Infusion 150 MG (Base Eq): INTRAVENOUS | Qty: 5 | Status: AC

## 2022-03-05 MED FILL — Dexamethasone Sodium Phosphate Inj 100 MG/10ML: INTRAMUSCULAR | Qty: 1 | Status: AC

## 2022-03-05 NOTE — Assessment & Plan Note (Addendum)
#  Left upper lobe-lung cancer moderately differentiated squamous cell carcinoma-pT1pN1- [IIB]; PDL-1 =70%. EGFR status-WILD.   adjuvant chemotherapy-cisplatin Taxotere x 4 cycles; followed by adjuvant Atezo/Pembrolizumab x1 year.   # Proceed with cycle # 2/ of planned #4- adjuvant chemotherapy cisplatin-Taxotere every 3 weeks. Labs today reviewed;  acceptable for treatment today.  Discussed that we will continue current dose/intensity of chemotherapy.  However if significant side effects noted would recommend continuing the dose.  # Nausea/vomiting-secondary chemotherapy-improved with IV fluids.  Recommend keeping up with dexamethasone as ordered.  If recurrent would recommend Zyprexa low-dose.  Plan IV fluids post udenyca.   # IV Access: port  # DISPOSITION:  # chemo as planned tomorrow  # on 1/05- IVFs 1 lit- with udenyca   # follow up in 1 week- APP;labs- cbc/bmp;mag; possible IVFs over 1 hour  # Follow up in 3 weeks-WED- MD;port- labs- cbc/cmp;mag; Thursday-D-2 Cispatin-Taxotere; D-2 Udenyca/IVFs over 1 hour- Dr.B

## 2022-03-05 NOTE — Addendum Note (Signed)
Addended by: Cammie Sickle on: 03/05/2022 04:36 PM   Modules accepted: Orders

## 2022-03-05 NOTE — Progress Notes (Signed)
[facility] CONSULT NOTE  Patient Care Team: Tower, [name], MD as PCP - General Telford Nab, RN as Oncology Nurse Navigator  CHIEF COMPLAINTS/PURPOSE OF CONSULTATION: lung cancer  #  Oncology History Overview Note  JULY 2023- 1. 10 mm mean diameter nodule in the left upper lobe adjacent to an area of pleural and parenchymal scarring.     SEP 2023- 1. Hypermetabolic spiculated nodular left upper lobe pulmonary lesion is compatible with primary bronchogenic neoplasm. 2. Mildly metabolic nodularity in the left hilum, without discrete adenopathy may reflect early nodal disease involvement. Suggest attention on follow-up imaging. 3. No evidence of distant hypermetabolic metastatic disease. 4. Hypermetabolic nodule in the left lobe of the thyroid, Recommend thyroid US and biopsy (ref: J Am Coll Radiol. 2015 Feb;12(2): 143-50). 5. Aortic Atherosclerosis (ICD10-I70.0) and Emphysema (ICD10-J43.9).  # LUNG: Resection  Synchronous Tumors: Not applicable Total Number of Primary Tumors: 1 Procedure: Lobectomy, lung Specimen Laterality: Left Tumor Focality: Unifocal Tumor Site: Upper lobe Tumor Size: 2.6 cm Histologic Type: Squamous cell carcinoma, moderately differentiated Visceral Pleura Invasion: Not identified Direct Invasion of Adjacent Structures: No adjacent structures present Lymphovascular Invasion: Not identified Margins: All margins negative for invasive carcinoma      Closest Margin(s) to Invasive Carcinoma: Bronchovascular margin      Margin(s) Involved by Invasive Carcinoma: Not applicable       Margin Status for Non-Invasive Tumor: Negative Treatment Effect: No known presurgical therapy Regional Lymph Nodes:      Number of Lymph Nodes Involved: 1                           Nodal Sites with Tumor: Level 10      Number of Lymph Nodes Examined: 21                      Nodal Sites Examined: Levels 4, 5, 7, 9, 10, 11, 12 and 13 Distant Metastasis:       Distant Site(s) Involved: Not applicable Pathologic Stage Classification (pTNM, AJCC 8th Edition): pT1c, pN1 Ancillary Studies: Can be performed upon request Representative Tumor Block: B5 Comment(s): None  # DEC 14th, 2023- Cisplatin-Taxotere #1; D-2 [name] x4 cycles    Lung cancer ([place])  12/05/2021 Initial Diagnosis   Lung cancer ([place])   12/18/2021 Cancer Staging   Staging form: Lung, AJCC 8th Edition - Pathologic stage from 12/18/2021: Stage IIB (pT1c, pN1, cM0) - Signed by [name], MD on 12/18/2021 Histopathologic type: Squamous cell carcinoma, NOS   Cancer of upper lobe of left lung ([place])  01/03/2022 Initial Diagnosis   Cancer of upper lobe of left lung ([place])   01/03/2022 Cancer Staging   Staging form: Lung, AJCC 8th Edition - Pathologic: Stage IIB (pT1c, pN1, cM0) - Signed by Cammie Sickle, MD on 01/03/2022   02/13/2022 -  Chemotherapy   Patient is on Treatment Plan : LUNG Cisplatin + Docetaxel q21d      HISTORY OF PRESENTING ILLNESS: Ambulating independently.  Accompanied by daughter.  [name] [name] 72 y.o.  female history of smoking with stage II T2 N1 squamous cell carcinoma left lung cancer currently on adjuvant chemotherapy with cisplatin and Taxotere is here for follow-up.  In the interim patient also had a port placement.   Patient admits to nausea with vomiting that lasted for couple of days postchemotherapy.  Symptoms improved with IV fluids.  Patient did not take dexamethasone as recommended.  Denies any significant leg swelling.  Denies any tingling or numbness.  Patient is recovering well.  Denies any worsening pain.  Denies any worsening shortness of breath.  Review of Systems  Constitutional:  Negative for chills, diaphoresis, fever, malaise/fatigue and weight loss.  HENT:  Negative for nosebleeds and sore throat.   Eyes:  Negative for double vision.  Respiratory:  Negative for cough, hemoptysis, sputum production, shortness of  breath and wheezing.   Cardiovascular:  Negative for chest pain, palpitations, orthopnea and leg swelling.  Gastrointestinal:  Negative for abdominal pain, blood in stool, constipation, diarrhea, heartburn, melena, nausea and vomiting.  Genitourinary:  Negative for dysuria, frequency and urgency.  Musculoskeletal:  Negative for back pain and joint pain.  Skin: Negative.  Negative for itching and rash.  Neurological:  Negative for dizziness, tingling, focal weakness, weakness and headaches.  Endo/Heme/Allergies:  Does not bruise/bleed easily.  Psychiatric/Behavioral:  Negative for depression. The patient is not nervous/anxious and does not have insomnia.      MEDICAL HISTORY:  Past Medical History:  Diagnosis Date   Arthritis    OA   Carpal tunnel syndrome    Depression    Endometriosis    Fibromyalgia    Hyperlipidemia    Left upper lobe pulmonary nodule 11/2021   Post herpetic neuralgia    Sleep disorder    Tobacco abuse     SURGICAL HISTORY: Past Surgical History:  Procedure Laterality Date   ABDOMINAL HYSTERECTOMY  1992   total endometriosis   APPENDECTOMY  1992   bladder tack  1992   CATARACT EXTRACTION W/PHACO Right 03/25/2018   Procedure: CATARACT EXTRACTION PHACO AND INTRAOCULAR LENS PLACEMENT ([place]) RIGHT;  Surgeon: Marchia Meiers, MD;  Location: [facility];  Service: Ophthalmology;  Laterality: Right;  [place]  01:11 CDE 8.02 Fluid pack lot # [id number]   CATARACT EXTRACTION W/PHACO Left 04/22/2018   Procedure: CATARACT EXTRACTION PHACO AND INTRAOCULAR LENS PLACEMENT ([place]) Left Eye;  Surgeon: Marchia Meiers, MD;  Location: [facility];  Service: Ophthalmology;  Laterality: Left;  [place]  01:02 CDE 8.11 Fluid pack lot # [id number]   COLONOSCOPY     FOOT SURGERY Right 04/2016   [place] in [place] great toe; straightening others   INTERCOSTAL NERVE BLOCK Left 12/12/2021   Procedure: INTERCOSTAL NERVE BLOCK;  Surgeon: [name], MD;  Location: [facility];  Service:  Thoracic;  Laterality: Left;   IR IMAGING GUIDED PORT INSERTION  02/27/2022   LYMPH NODE DISSECTION Left 12/12/2021   Procedure: LYMPH NODE DISSECTION;  Surgeon: [name], MD;  Location: [facility];  Service: Thoracic;  Laterality: Left;   VIDEO BRONCHOSCOPY N/A 12/16/2021   Procedure: VIDEO BRONCHOSCOPY;  Surgeon: [name], MD;  Location: [facility];  Service: Thoracic;  Laterality: N/A;    SOCIAL HISTORY: Social History   Socioeconomic History   Marital status: Widowed    Spouse name: Not on file   Number of children: 2   Years of education: Not on file   Highest education level: Not on file  Occupational History   Not on file  Tobacco Use   Smoking status: Former    Packs/day: 1.00    Years: 55.00    Total pack years: 55.00    Types: Cigarettes    Quit date: 12/03/2021    Years since quitting: 0.2   Smokeless tobacco: Never  Vaping Use   Vaping Use: Some days  Substance and Sexual Activity   Alcohol use: No  Alcohol/week: 0.0 standard drinks of alcohol   Drug use: No   Sexual activity: Not Currently  Other Topics Concern   Not on file  Social History Narrative   Lives alone   Social Determinants of Health   Financial Resource Strain: Low Risk  (02/01/2021)   Overall Financial Resource Strain (CARDIA)    Difficulty of Paying Living Expenses: Not hard at all  Food Insecurity: No Food Insecurity (02/01/2021)   Hunger Vital Sign    Worried About Running Out of Food in the Last Year: Never true    Ran Out of Food in the Last Year: Never true  Transportation Needs: No Transportation Needs (02/18/2022)   PRAPARE - Hydrologist (Medical): No    Lack of Transportation (Non-Medical): No  Physical Activity: Inactive (02/01/2021)   Exercise Vital Sign    Days of Exercise per Week: 0 days    Minutes of Exercise per Session: 0 min  Stress: No Stress Concern Present (02/01/2021)   [place]    Feeling of Stress : Not at all  Social Connections: [place] (02/01/2021)   Social Connection and Isolation Panel [NHANES]    Frequency of Communication with Friends and Family: More than three times a week    Frequency of Social Gatherings with Friends and Family: More than three times a week    Attends Religious Services: More than 4 times per year    Active Member of [employer] or Organizations: Yes    Attends Music therapist: More than 4 times per year    Marital Status: Married  Human resources officer Violence: Not At Risk (02/01/2021)   Humiliation, Afraid, Rape, and Kick questionnaire    Fear of Current or Ex-Partner: No    Emotionally Abused: No    Physically Abused: No    Sexually Abused: No    FAMILY HISTORY: Family History  Problem Relation Age of Onset   Heart disease Mother        MI   Heart disease Father        MI   Arthritis Sister        crippling arthritis   Arthritis Brother        crippling arthritis    ALLERGIES:  has No Known Allergies.  MEDICATIONS:  Current Outpatient Medications  Medication Sig Dispense Refill   acetaminophen (TYLENOL) 500 MG tablet Take 500 mg by mouth every 6 (six) hours as needed.     aspirin 81 MG tablet Take 81 mg by mouth daily.     busPIRone (BUSPAR) 15 MG tablet TAKE 1/2 TABLET BY MOUTH TWICE A DAY 90 tablet 2   Cobalamin Combinations (NEURIVA PLUS PO) Take by mouth.     dexamethasone (DECADRON) 4 MG tablet Take 1 tablet (4 mg total) by mouth 2 (two) times daily with a meal. Start 2 days prior to chemo. Take for 2 days; DO NOT take on the day of chemo. 60 tablet 0   docusate sodium (COLACE) 100 MG capsule Take 200 mg by mouth daily.     DULoxetine (CYMBALTA) 60 MG capsule TAKE 1 CAPSULE BY MOUTH EVERY DAY 90 capsule 2   Fluticasone-Umeclidin-Vilant (TRELEGY ELLIPTA) 100-62.5-25 MCG/ACT AEPB Inhale 1 puff into the lungs daily. 28 each 11   lidocaine-prilocaine (EMLA) cream  Apply on the port. 30 -45 min  prior to port access. 30 g 3   MAGNESIUM CITRATE PO Take by mouth daily.  Melatonin 10 MG CAPS Take 20 mg by mouth at bedtime.     naproxen sodium (ALEVE) 220 MG tablet Take 440 mg by mouth daily as needed (headaches).     ondansetron (ZOFRAN) 8 MG tablet One pill every 8 hours as needed for nausea/vomitting. 40 tablet 1   pregabalin (LYRICA) 75 MG capsule TAKE 1 CAPSULE BY MOUTH TWICE A DAY 180 capsule 1   prochlorperazine (COMPAZINE) 10 MG tablet Take 1 tablet (10 mg total) by mouth every 6 (six) hours as needed for nausea or vomiting. 40 tablet 1   senna (SENOKOT) 8.6 MG tablet Take 1 tablet by mouth as needed for constipation.     sulfamethoxazole-trimethoprim (BACTRIM) 400-80 MG tablet Take 1 tablet by mouth 2 (two) times daily. 14 tablet 0   traMADol (ULTRAM) 50 MG tablet TAKE 1 TABLET BY MOUTH EVERY 8 HOURS AS NEEDED. 90 tablet 0   No current facility-administered medications for this visit.   Facility-Administered Medications Ordered in Other Visits  Medication Dose Route Frequency Provider Last Rate Last Admin   0.9 %  sodium chloride infusion   Intravenous Continuous Hughie Closs, PA-C   Stopped at 02/17/22 1214      .  PHYSICAL EXAMINATION: ECOG PERFORMANCE STATUS: 0 - Asymptomatic  Vitals:   03/05/22 1500  BP: (!) 157/79  Pulse: 70  Resp: 18  Temp: 98.5 F (36.9 C)   Filed Weights   03/05/22 1500  Weight: 178 lb 3.2 oz (80.8 kg)    Physical Exam Vitals and nursing note reviewed.  Constitutional:      Comments:     HENT:     Head: Normocephalic and atraumatic.     Mouth/Throat:     Mouth: Mucous membranes are moist.     Pharynx: No oropharyngeal exudate.  Eyes:     Pupils: Pupils are equal, round, and reactive to light.  Cardiovascular:     Rate and Rhythm: Normal rate and regular rhythm.  Pulmonary:     Effort: No respiratory distress.     Breath sounds: No wheezing.     Comments: Decreased breath sounds  bilaterally at bases.  No wheeze or crackles Abdominal:     General: Bowel sounds are normal. There is no distension.     Palpations: Abdomen is soft. There is no mass.     Tenderness: There is no abdominal tenderness. There is no guarding or rebound.  Musculoskeletal:        General: No tenderness. Normal range of motion.     Cervical back: Normal range of motion and neck supple.  Skin:    General: Skin is warm.  Neurological:     Mental Status: She is alert and oriented to person, place, and time.  Psychiatric:        Mood and Affect: Affect normal.        Judgment: Judgment normal.      LABORATORY DATA:  I have reviewed the data as listed Lab Results  Component Value Date   WBC 15.7 (H) 03/05/2022   HGB 11.1 (L) 03/05/2022   HCT 32.9 (L) 03/05/2022   MCV 87.7 03/05/2022   PLT 424 (H) 03/05/2022   Recent Labs    02/17/22 0956 02/18/22 0903 03/05/22 1448  NA 133* 136 135  K 3.9 3.7 4.5  CL 95* 103 104  CO2 _0 GLUCOSE 156* 114* 165*  BUN _1 CREATININE 0.76 0.63 0.82  CALCIUM 9.2 9.1 9.1  GFRNONAA >  60 >60 >60  PROT 7.6 7.1 7.0  ALBUMIN 3.9 3.8 3.6  AST _0 ALT _1 ALKPHOS 118 101 79  BILITOT 1.3* 0.8 0.4    RADIOGRAPHIC STUDIES: I have personally reviewed the radiological images as listed and agreed with the findings in the report. IR IMAGING GUIDED PORT INSERTION  Result Date: 02/27/2022 INDICATION: 72 year old woman with history of lung cancer presents to IR for chest port placement EXAM: IMPLANTED PORT A CATH PLACEMENT WITH ULTRASOUND AND FLUOROSCOPIC GUIDANCE MEDICATIONS: None ANESTHESIA/SEDATION: Moderate (conscious) sedation was employed during this procedure. A total of Versed 2 mg and Fentanyl 100 mcg was administered intravenously by the radiology nurse. Total intra-service moderate Sedation Time: 50 minutes. The patient's level of consciousness and vital signs were monitored continuously by radiology nursing throughout the  procedure under my direct supervision. FLUOROSCOPY: Radiation Exposure Index (as provided by the fluoroscopic device): 9.1 mGy Kerma COMPLICATIONS: None immediate. PROCEDURE: The procedure, risks, benefits, and alternatives were explained to the patient. Questions regarding the procedure were encouraged and answered. The patient understands and consents to the procedure. A timeout was performed prior to the initiation of the procedure. Patient positioned supine on the angiography table. Right neck and anterior upper chest prepped and draped in the usual sterile fashion. All elements of maximal sterile barrier were utilized including, cap, mask, sterile gown, sterile gloves, large sterile drape, hand scrubbing and 2% Chlorhexidine for skin cleaning. The right internal jugular vein was evaluated with ultrasound and shown to be patent. A permanent ultrasound image was obtained and placed in the patient's medical record. Local anesthesia was provided with 1% lidocaine with epinephrine. Using sterile gel and a sterile probe cover, the right internal jugular vein was entered with a 21 ga needle during real time ultrasound guidance. 0.018 inch guidewire placed and 21 ga needle exchanged for transitional dilator set. Utilizing fluoroscopy, 0.035 inch guidewire advanced centrally without difficulty. Attention then turned to the right anterior upper chest. Following local lidocaine administration, a port pocket was created. The catheter was connected to the port and brought from the pocket to the venotomy site through a subcutaneous tunnel. The catheter was cut to size and inserted through the peel-away sheath. The catheter tip was positioned at the cavoatrial junction using fluoroscopic guidance. The port aspirated and flushed well. The port pocket was closed with deep and superficial absorbable suture. The port pocket incision and venotomy sites were also sealed with Dermabond. IMPRESSION: Successful placement of a right  internal jugular approach power injectable Port-A-Cath. The catheter is ready for immediate use. Electronically Signed   By: Miachel Roux M.D.   On: 02/27/2022 14:39    ASSESSMENT & PLAN:   Cancer of upper lobe of left lung ([place]) # Left upper lobe-lung cancer moderately differentiated squamous cell carcinoma-pT1pN1- [IIB]; PDL-1 =70%. EGFR status-WILD.   adjuvant chemotherapy-cisplatin Taxotere x 4 cycles; followed by adjuvant Atezo/Pembrolizumab x1 year.   # Proceed with cycle # 2/ of planned #4- adjuvant chemotherapy cisplatin-Taxotere every 3 weeks. Labs today reviewed;  acceptable for treatment today.  Discussed that we will continue current dose/intensity of chemotherapy.  However if significant side effects noted would recommend continuing the dose.  # Nausea/vomiting-secondary chemotherapy-improved with IV fluids.  Recommend keeping up with dexamethasone as ordered.  If recurrent would recommend Zyprexa low-dose.  Plan IV fluids post udenyca.   # DISPOSITION:  # chemo as planned tomorrow  # on 1/05- IVFs 1 lit- with udenyca   # follow  up in 1 week- APP;labs- cbc/bmp;mag; possible IVFs over 1 hour  # Follow up in 3 weeks-WED- MD; labs- cbc/cmp;mag; Thursday-D-2 Cispatin-Taxotere; D-2 Udenyca/IVFs over 1 hour- Dr.B     All questions were answered. The patient knows to call the clinic with any problems, questions or concerns.       Cammie Sickle, MD 03/05/2022 4:33 PM

## 2022-03-05 NOTE — Progress Notes (Signed)
Patient feeling fatigued, increasing SOBr, and new nosebleed every morning when she blows her nose.    Constipation minimal bowel movement yesterday.  Taking OTC stool softener daily, laxative PRN, and mag citrate daily.

## 2022-03-06 ENCOUNTER — Ambulatory Visit: Payer: Medicare Other

## 2022-03-06 ENCOUNTER — Encounter: Payer: Self-pay | Admitting: *Deleted

## 2022-03-06 ENCOUNTER — Other Ambulatory Visit: Payer: Self-pay

## 2022-03-06 ENCOUNTER — Inpatient Hospital Stay: Payer: Medicare Other

## 2022-03-06 VITALS — BP 160/79 | HR 64 | Temp 98.2°F | Resp 18

## 2022-03-06 DIAGNOSIS — Z5111 Encounter for antineoplastic chemotherapy: Secondary | ICD-10-CM | POA: Diagnosis not present

## 2022-03-06 DIAGNOSIS — Z902 Acquired absence of lung [part of]: Secondary | ICD-10-CM | POA: Diagnosis not present

## 2022-03-06 DIAGNOSIS — Z5189 Encounter for other specified aftercare: Secondary | ICD-10-CM | POA: Diagnosis not present

## 2022-03-06 DIAGNOSIS — C3412 Malignant neoplasm of upper lobe, left bronchus or lung: Secondary | ICD-10-CM

## 2022-03-06 DIAGNOSIS — F17210 Nicotine dependence, cigarettes, uncomplicated: Secondary | ICD-10-CM | POA: Diagnosis not present

## 2022-03-06 DIAGNOSIS — R112 Nausea with vomiting, unspecified: Secondary | ICD-10-CM | POA: Diagnosis not present

## 2022-03-06 MED ORDER — MAGNESIUM SULFATE 2 GM/50ML IV SOLN
2.0000 g | Freq: Once | INTRAVENOUS | Status: AC
  Administered 2022-03-06: 2 g via INTRAVENOUS
  Filled 2022-03-06: qty 50

## 2022-03-06 MED ORDER — SODIUM CHLORIDE 0.9 % IV SOLN
75.0000 mg/m2 | Freq: Once | INTRAVENOUS | Status: AC
  Administered 2022-03-06: 142 mg via INTRAVENOUS
  Filled 2022-03-06: qty 142

## 2022-03-06 MED ORDER — SODIUM CHLORIDE 0.9 % IV SOLN
Freq: Once | INTRAVENOUS | Status: AC
  Filled 2022-03-06: qty 250

## 2022-03-06 MED ORDER — SODIUM CHLORIDE 0.9 % IV SOLN
150.0000 mg | Freq: Once | INTRAVENOUS | Status: AC
  Administered 2022-03-06: 150 mg via INTRAVENOUS
  Filled 2022-03-06: qty 150

## 2022-03-06 MED ORDER — HEPARIN SOD (PORK) LOCK FLUSH 100 UNIT/ML IV SOLN
500.0000 [IU] | Freq: Once | INTRAVENOUS | Status: AC | PRN
  Administered 2022-03-06: 500 [IU]
  Filled 2022-03-06: qty 5

## 2022-03-06 MED ORDER — SODIUM CHLORIDE 0.9 % IV SOLN
10.0000 mg | Freq: Once | INTRAVENOUS | Status: AC
  Administered 2022-03-06: 10 mg via INTRAVENOUS
  Filled 2022-03-06: qty 10

## 2022-03-06 MED ORDER — PALONOSETRON HCL INJECTION 0.25 MG/5ML
0.2500 mg | Freq: Once | INTRAVENOUS | Status: AC
  Administered 2022-03-06: 0.25 mg via INTRAVENOUS
  Filled 2022-03-06: qty 5

## 2022-03-06 MED ORDER — SODIUM CHLORIDE 0.9 % IV SOLN
75.0000 mg/m2 | Freq: Once | INTRAVENOUS | Status: AC
  Administered 2022-03-06: 140 mg via INTRAVENOUS
  Filled 2022-03-06: qty 14

## 2022-03-06 MED ORDER — POTASSIUM CHLORIDE IN NACL 20-0.9 MEQ/L-% IV SOLN
Freq: Once | INTRAVENOUS | Status: AC
  Filled 2022-03-06: qty 1000

## 2022-03-06 NOTE — Patient Instructions (Signed)
[facility] [place]  Discharge Instructions: Thank you for choosing [facility] to provide your oncology and hematology care.  If you have a lab appointment with the [facility], please go directly to the [facility] and check in at the registration area.  Wear comfortable clothing and clothing appropriate for easy access to any Portacath or PICC line.   We strive to give you quality time with your provider. You may need to reschedule your appointment if you arrive late (15 or more minutes).  Arriving late affects you and other patients whose appointments are after yours.  Also, if you miss three or more appointments without notifying the office, you may be dismissed from the clinic at the provider's discretion.      For prescription refill requests, have your pharmacy contact our office and allow 72 hours for refills to be completed.    Today you received the following chemotherapy and/or immunotherapy agents Platinol & Taxotere      To help prevent nausea and vomiting after your treatment, we encourage you to take your nausea medication as directed.  BELOW ARE SYMPTOMS THAT SHOULD BE REPORTED IMMEDIATELY: *FEVER GREATER THAN 100.4 F (38 C) OR HIGHER *CHILLS OR SWEATING *NAUSEA AND VOMITING THAT IS NOT CONTROLLED WITH YOUR NAUSEA MEDICATION *UNUSUAL SHORTNESS OF BREATH *UNUSUAL BRUISING OR BLEEDING *URINARY PROBLEMS (pain or burning when urinating, or frequent urination) *BOWEL PROBLEMS (unusual diarrhea, constipation, pain near the anus) TENDERNESS IN MOUTH AND THROAT WITH OR WITHOUT PRESENCE OF ULCERS (sore throat, sores in mouth, or a toothache) UNUSUAL RASH, SWELLING OR PAIN  UNUSUAL VAGINAL DISCHARGE OR ITCHING   Items with * indicate a potential emergency and should be followed up as soon as possible or go to the Emergency Department if any problems should occur.  Please show the CHEMOTHERAPY ALERT CARD or IMMUNOTHERAPY ALERT CARD at  check-in to the Emergency Department and triage nurse.  Should you have questions after your visit or need to cancel or reschedule your appointment, please contact [facility] [place] [facility] [place]  [phone] and follow the prompts.  Office hours are 8:00 a.m. to 4:30 p.m. Monday - Friday. Please note that voicemails left after 4:00 p.m. may not be returned until the following business day.  We are closed weekends and major holidays. You have access to a nurse at all times for urgent questions. Please call the main number to the clinic [phone] and follow the prompts.  For any non-urgent questions, you may also contact your provider using MyChart. We now offer e-Visits for anyone 49 and older to request care online for non-urgent symptoms. For details visit [email or web address].   Also download the MyChart app! Go to the app store, search "MyChart", open the app, select [facility], and log in with your MyChart username and password.

## 2022-03-06 NOTE — Progress Notes (Signed)
Per Dr Rogue Bussing, ok to run cisplatin concurrently with post hydration fluids

## 2022-03-07 ENCOUNTER — Inpatient Hospital Stay: Payer: Medicare Other

## 2022-03-07 VITALS — BP 181/94 | HR 66 | Temp 98.0°F | Resp 20

## 2022-03-07 DIAGNOSIS — R112 Nausea with vomiting, unspecified: Secondary | ICD-10-CM | POA: Diagnosis not present

## 2022-03-07 DIAGNOSIS — Z902 Acquired absence of lung [part of]: Secondary | ICD-10-CM | POA: Diagnosis not present

## 2022-03-07 DIAGNOSIS — Z5189 Encounter for other specified aftercare: Secondary | ICD-10-CM | POA: Diagnosis not present

## 2022-03-07 DIAGNOSIS — C3412 Malignant neoplasm of upper lobe, left bronchus or lung: Secondary | ICD-10-CM | POA: Diagnosis not present

## 2022-03-07 DIAGNOSIS — F17210 Nicotine dependence, cigarettes, uncomplicated: Secondary | ICD-10-CM | POA: Diagnosis not present

## 2022-03-07 DIAGNOSIS — Z5111 Encounter for antineoplastic chemotherapy: Secondary | ICD-10-CM | POA: Diagnosis not present

## 2022-03-07 MED ORDER — PEGFILGRASTIM-CBQV 6 MG/0.6ML ~~LOC~~ SOSY
6.0000 mg | PREFILLED_SYRINGE | Freq: Once | SUBCUTANEOUS | Status: AC
  Administered 2022-03-07: 6 mg via SUBCUTANEOUS
  Filled 2022-03-07: qty 0.6

## 2022-03-07 MED ORDER — SODIUM CHLORIDE 0.9 % IV SOLN
Freq: Once | INTRAVENOUS | Status: AC
  Filled 2022-03-07: qty 250

## 2022-03-07 MED ORDER — SODIUM CHLORIDE 0.9% FLUSH
10.0000 mL | Freq: Once | INTRAVENOUS | Status: AC | PRN
  Administered 2022-03-07: 10 mL
  Filled 2022-03-07: qty 10

## 2022-03-07 MED ORDER — HEPARIN SOD (PORK) LOCK FLUSH 100 UNIT/ML IV SOLN
500.0000 [IU] | Freq: Once | INTRAVENOUS | Status: AC | PRN
  Administered 2022-03-07: 500 [IU]
  Filled 2022-03-07: qty 5

## 2022-03-12 ENCOUNTER — Inpatient Hospital Stay: Payer: Medicare Other

## 2022-03-12 ENCOUNTER — Inpatient Hospital Stay (HOSPITAL_BASED_OUTPATIENT_CLINIC_OR_DEPARTMENT_OTHER): Payer: Medicare Other | Admitting: Medical Oncology

## 2022-03-12 ENCOUNTER — Encounter: Payer: Self-pay | Admitting: Medical Oncology

## 2022-03-12 ENCOUNTER — Other Ambulatory Visit: Payer: Self-pay

## 2022-03-12 ENCOUNTER — Encounter: Payer: Self-pay | Admitting: *Deleted

## 2022-03-12 VITALS — BP 117/73 | HR 81 | Temp 98.2°F | Resp 17 | Wt 175.0 lb

## 2022-03-12 DIAGNOSIS — B370 Candidal stomatitis: Secondary | ICD-10-CM | POA: Diagnosis not present

## 2022-03-12 DIAGNOSIS — R519 Headache, unspecified: Secondary | ICD-10-CM | POA: Diagnosis not present

## 2022-03-12 DIAGNOSIS — K5900 Constipation, unspecified: Secondary | ICD-10-CM

## 2022-03-12 DIAGNOSIS — C3412 Malignant neoplasm of upper lobe, left bronchus or lung: Secondary | ICD-10-CM | POA: Diagnosis not present

## 2022-03-12 DIAGNOSIS — R112 Nausea with vomiting, unspecified: Secondary | ICD-10-CM | POA: Diagnosis not present

## 2022-03-12 DIAGNOSIS — E860 Dehydration: Secondary | ICD-10-CM

## 2022-03-12 DIAGNOSIS — Z5189 Encounter for other specified aftercare: Secondary | ICD-10-CM | POA: Diagnosis not present

## 2022-03-12 DIAGNOSIS — F17210 Nicotine dependence, cigarettes, uncomplicated: Secondary | ICD-10-CM | POA: Diagnosis not present

## 2022-03-12 DIAGNOSIS — Z5111 Encounter for antineoplastic chemotherapy: Secondary | ICD-10-CM | POA: Diagnosis not present

## 2022-03-12 DIAGNOSIS — Z902 Acquired absence of lung [part of]: Secondary | ICD-10-CM | POA: Diagnosis not present

## 2022-03-12 LAB — BASIC METABOLIC PANEL
Anion gap: 7 (ref 5–15)
BUN: 16 mg/dL (ref 8–23)
CO2: 27 mmol/L (ref 22–32)
Calcium: 8.8 mg/dL — ABNORMAL LOW (ref 8.9–10.3)
Chloride: 99 mmol/L (ref 98–111)
Creatinine, Ser: 0.75 mg/dL (ref 0.44–1.00)
GFR, Estimated: >60 mL/min (ref >60)
Glucose, Bld: 90 mg/dL (ref 70–99)
Potassium: 4.9 mmol/L (ref 3.5–5.1)
Sodium: 133 mmol/L — ABNORMAL LOW (ref 135–145)

## 2022-03-12 LAB — CBC WITH DIFFERENTIAL/PLATELET
Abs Immature Granulocytes: 0.08 10*3/uL — ABNORMAL HIGH (ref 0.00–0.07)
Basophils Absolute: 0 10*3/uL (ref 0.0–0.1)
Basophils Relative: 0 %
Eosinophils Absolute: 0.1 10*3/uL (ref 0.0–0.5)
Eosinophils Relative: 1 %
HCT: 33.7 % — ABNORMAL LOW (ref 36.0–46.0)
Hemoglobin: 11.3 g/dL — ABNORMAL LOW (ref 12.0–15.0)
Immature Granulocytes: 1 %
Lymphocytes Relative: 18 %
Lymphs Abs: 1.8 10*3/uL (ref 0.7–4.0)
MCH: 29.3 pg (ref 26.0–34.0)
MCHC: 33.5 g/dL (ref 30.0–36.0)
MCV: 87.3 fL (ref 80.0–100.0)
Monocytes Absolute: 0.8 10*3/uL (ref 0.1–1.0)
Monocytes Relative: 8 %
Neutro Abs: 6.8 10*3/uL (ref 1.7–7.7)
Neutrophils Relative %: 72 %
Platelets: 204 10*3/uL (ref 150–400)
RBC: 3.86 MIL/uL — ABNORMAL LOW (ref 3.87–5.11)
RDW: 14.9 % (ref 11.5–15.5)
Smear Review: NORMAL
WBC: 9.5 10*3/uL (ref 4.0–10.5)
nRBC: 0 % (ref 0.0–0.2)

## 2022-03-12 LAB — MAGNESIUM: Magnesium: 1.8 mg/dL (ref 1.7–2.4)

## 2022-03-12 MED ORDER — SODIUM CHLORIDE 0.9 % IV SOLN
INTRAVENOUS | Status: DC
  Filled 2022-03-12 (×2): qty 250

## 2022-03-12 MED ORDER — HEPARIN SOD (PORK) LOCK FLUSH 100 UNIT/ML IV SOLN
500.0000 [IU] | Freq: Once | INTRAVENOUS | Status: AC | PRN
  Administered 2022-03-12: 500 [IU]
  Filled 2022-03-12: qty 5

## 2022-03-12 MED ORDER — NYSTATIN 100000 UNIT/ML MT SUSP: 5.0000 mL | Freq: Four times a day (QID) | OROMUCOSAL | 1 refills | Status: DC | PRN

## 2022-03-12 MED ORDER — SENNOSIDES 8.6 MG PO TABS: 1.0000 | ORAL_TABLET | Freq: Two times a day (BID) | ORAL | 0 refills | Status: AC | PRN

## 2022-03-12 MED ORDER — SODIUM CHLORIDE 0.9% FLUSH
10.0000 mL | Freq: Once | INTRAVENOUS | Status: AC | PRN
  Administered 2022-03-12: 10 mL
  Filled 2022-03-12: qty 10

## 2022-03-12 MED ORDER — SODIUM CHLORIDE 0.9 % IV SOLN
Freq: Once | INTRAVENOUS | Status: AC
  Filled 2022-03-12: qty 250

## 2022-03-12 NOTE — Progress Notes (Signed)
Patient here for oncology follow-up appointment, concerns of constant headache, sore throat with difficult swallowing and extreme constipation

## 2022-03-12 NOTE — Progress Notes (Signed)
Nutrition Assessment   Reason for Assessment:  Referral received   ASSESSMENT:  72 year old female with lung cancer.  Past medical history of depression, HLD.  Patient currently on cisplatin/taxotere.    Spoke with patient via phone.  Patient reports that she has no taste, altered taste.  She has been trying to drink water, gatorade, boost/ensure shakes (1-2/day).  Says that sweet things have a taste but leave a slime in her mouth.  Noted she was seen today for thrush which was felt to be causing sore throat.  Patient has been eating potatoes, pinto beans, chicken, slaw, etc.  Has not had a bowel movement in over a week (laxatives were prescribed today).  Says that smells nauseated her and has been taking nausea pills.      Medications: zofran, compazine, MMW, senna   Labs: Na 133   Anthropometrics:   Height: 64 inches Weight: 175 lb today UBW: 166-172 lb BMI: 30    Estimated Energy Needs  Kcals: 1975-2300 Protein: 98-115 g Fluid: 1975-2300 ml   NUTRITION DIAGNOSIS: Inadequate oral intake related to cancer treatment related side effects as evidenced by taste alteration, nausea, constipation.      INTERVENTION:  Discussed strategies to help with taste change.  Will mail handout Encouraged patient to be proactive with taking nausea medication.  Will mail Nausea and Vomiting handout Encouraged 350 calorie shake. Will mail coupons Contact information provided   MONITORING, EVALUATION, GOAL: weight trends, intake   Next Visit: Thursday, Jan 25 during infusion  Machelle Raybon B. Zenia Resides, [place], [place] Registered Dietitian [phone]

## 2022-03-12 NOTE — Progress Notes (Signed)
Sympt[facility] Management [place] at [facility] Telephone:([phone] Fax:([phone]) [phone]  Patient Care Team: Tower, [name], MD as PCP - General Telford Nab, RN as Oncology Nurse Navigator   Name of the patient: [name] [name]  [id number]  1950-09-30   Date of visit: 03/12/22  Oncological History:Left upper lobe-lung cancer moderately differentiated squamous cell carcinoma-pT1pN1- [IIB]; PDL-1 =70%. EGFR status-WILD   Current Treatment: Adjuvant chemotherapy-cisplatin Taxotere x 4 cycles; followed by adjuvant Atezo/Pembrolizumab x1 year currently s/p cycle 2 of 4 cisplatin-taxotere Q3 weeks. Last treatment was on 03/03/2022. Had Udenyca support on 03/07/2022   Reason for Symptom Management Visit today: [name] [name] is a 72 y.o. female who presents today for:  Headache: Patient reports that she has had a headache, constipation, sore throat for the past 4 days. These symptoms all also occurred after her last chemotherapy treatment and lasted a few days, resolving with supportive measures.. At her last visit her Oncologist Dr. Rogue Bussing suggested she trial low dose Zyprexa if her nausea/vomiting continued to be significantly bothersome despite dexamethosone use and IVF. Today she describes the symptoms as a bit worse than after her first round of chemotherapy. She has been trying to eat and drink though her sore throat and nausea have hindered this a bit. The good news is that she has not vomiting thus far which is improvement from last round of chemotherapy. She has noticed a white film on her tongue and in her mouth. No swelling of the tongue and no trouble swallowing or breathing. No SOB, chest pain, fevers. She does report a mild constant headache without visual changes and some constipation. Last BM was 1 week ago. She is passing gas. Has tried stool softener without much improvement.    Denies any neurologic complaints. Denies recent fevers or  illnesses. Denies any easy bleeding or bruising. Denies urinary complaints. Patient offers no further specific complaints today.  Wt Readings from Last 3 Encounters:  03/12/22 175 lb (79.4 kg)  03/05/22 178 lb 3.2 oz (80.8 kg)  02/27/22 176 lb 5.9 oz (80 kg)     PAST MEDICAL HISTORY: Past Medical History:  Diagnosis Date   Arthritis    OA   Carpal tunnel syndrome    Depression    Endometriosis    Fibromyalgia    Hyperlipidemia    Left upper lobe pulmonary nodule 11/2021   Post herpetic neuralgia    Sleep disorder    Tobacco abuse     PAST SURGICAL HISTORY:  Past Surgical History:  Procedure Laterality Date   ABDOMINAL HYSTERECTOMY  1992   total endometriosis   APPENDECTOMY  1992   bladder tack  1992   CATARACT EXTRACTION W/PHACO Right 03/25/2018   Procedure: CATARACT EXTRACTION PHACO AND INTRAOCULAR LENS PLACEMENT ([place]) RIGHT;  Surgeon: Marchia Meiers, MD;  Location: [facility];  Service: Ophthalmology;  Laterality: Right;  [place]  01:11 CDE 8.02 Fluid pack lot # [id number]   CATARACT EXTRACTION W/PHACO Left 04/22/2018   Procedure: CATARACT EXTRACTION PHACO AND INTRAOCULAR LENS PLACEMENT ([place]) Left Eye;  Surgeon: Marchia Meiers, MD;  Location: [facility];  Service: Ophthalmology;  Laterality: Left;  [place]  01:02 CDE 8.11 Fluid pack lot # [id number]   COLONOSCOPY     FOOT SURGERY Right 04/2016   [place] in [place] great toe; straightening others   INTERCOSTAL NERVE BLOCK Left 12/12/2021   Procedure: INTERCOSTAL NERVE BLOCK;  Surgeon: [name], MD;  Location: [place];  Service: Thoracic;  Laterality:  Left;   IR IMAGING GUIDED PORT INSERTION  02/27/2022   LYMPH NODE DISSECTION Left 12/12/2021   Procedure: LYMPH NODE DISSECTION;  Surgeon: [name], MD;  Location: [place];  Service: Thoracic;  Laterality: Left;   VIDEO BRONCHOSCOPY N/A 12/16/2021   Procedure: VIDEO BRONCHOSCOPY;  Surgeon: [name], MD;  Location: [facility];  Service: Thoracic;   Laterality: N/A;    HEMATOLOGY/ONCOLOGY HISTORY:  Oncology History Overview Note  JULY 2023- 1. 10 mm mean diameter nodule in the left upper lobe adjacent to an area of pleural and parenchymal scarring.     SEP 2023- 1. Hypermetabolic spiculated nodular left upper lobe pulmonary lesion is compatible with primary bronchogenic neoplasm. 2. Mildly metabolic nodularity in the left hilum, without discrete adenopathy may reflect early nodal disease involvement. Suggest attention on follow-up imaging. 3. No evidence of distant hypermetabolic metastatic disease. 4. Hypermetabolic nodule in the left lobe of the thyroid, Recommend thyroid US and biopsy (ref: J Am Coll Radiol. 2015 Feb;12(2): 143-50). 5. Aortic Atherosclerosis (ICD10-I70.0) and Emphysema (ICD10-J43.9).  # LUNG: Resection  Synchronous Tumors: Not applicable Total Number of Primary Tumors: 1 Procedure: Lobectomy, lung Specimen Laterality: Left Tumor Focality: Unifocal Tumor Site: Upper lobe Tumor Size: 2.6 cm Histologic Type: Squamous cell carcinoma, moderately differentiated Visceral Pleura Invasion: Not identified Direct Invasion of Adjacent Structures: No adjacent structures present Lymphovascular Invasion: Not identified Margins: All margins negative for invasive carcinoma      Closest Margin(s) to Invasive Carcinoma: Bronchovascular margin      Margin(s) Involved by Invasive Carcinoma: Not applicable       Margin Status for Non-Invasive Tumor: Negative Treatment Effect: No known presurgical therapy Regional Lymph Nodes:      Number of Lymph Nodes Involved: 1                           Nodal Sites with Tumor: Level 10      Number of Lymph Nodes Examined: 21                      Nodal Sites Examined: Levels 4, 5, 7, 9, 10, 11, 12 and 13 Distant Metastasis:      Distant Site(s) Involved: Not applicable Pathologic Stage Classification (pTNM, AJCC 8th Edition): pT1c, pN1 Ancillary Studies: Can be performed upon  request Representative Tumor Block: B5 Comment(s): None  # DEC 14th, 2023- Cisplatin-Taxotere #1; D-2 [name] x4 cycles    Lung cancer ([place])  12/05/2021 Initial Diagnosis   Lung cancer ([place])   12/18/2021 Cancer Staging   Staging form: Lung, AJCC 8th Edition - Pathologic stage from 12/18/2021: Stage IIB (pT1c, pN1, cM0) - Signed by [name], MD on 12/18/2021 Histopathologic type: Squamous cell carcinoma, NOS   Cancer of upper lobe of left lung ([place])  01/03/2022 Initial Diagnosis   Cancer of upper lobe of left lung ([place])   01/03/2022 Cancer Staging   Staging form: Lung, AJCC 8th Edition - Pathologic: Stage IIB (pT1c, pN1, cM0) - Signed by Cammie Sickle, MD on 01/03/2022   02/13/2022 -  Chemotherapy   Patient is on Treatment Plan : LUNG Cisplatin + Docetaxel q21d       ALLERGIES:  has No Known Allergies.  MEDICATIONS:  Current Outpatient Medications  Medication Sig Dispense Refill   acetaminophen (TYLENOL) 500 MG tablet Take 500 mg by mouth every 6 (six) hours as needed.     aspirin 81 MG tablet Take 81  mg by mouth daily.     busPIRone (BUSPAR) 15 MG tablet TAKE 1/2 TABLET BY MOUTH TWICE A DAY 90 tablet 2   Cobalamin Combinations (NEURIVA PLUS PO) Take by mouth.     dexamethasone (DECADRON) 4 MG tablet Take 1 tablet (4 mg total) by mouth 2 (two) times daily with a meal. Start 2 days prior to chemo. Take for 2 days; DO NOT take on the day of chemo. 60 tablet 0   docusate sodium (COLACE) 100 MG capsule Take 200 mg by mouth daily.     DULoxetine (CYMBALTA) 60 MG capsule TAKE 1 CAPSULE BY MOUTH EVERY DAY 90 capsule 2   Fluticasone-Umeclidin-Vilant (TRELEGY ELLIPTA) 100-62.5-25 MCG/ACT AEPB Inhale 1 puff into the lungs daily. 28 each 11   lidocaine-prilocaine (EMLA) cream Apply on the port. 30 -45 min  prior to port access. 30 g 3   magic mouthwash (nystatin, lidocaine, diphenhydrAMINE, alum & mag hydroxide) suspension Swish and spit 5 mLs 4 (four) times daily as  needed for mouth pain. 240 mL 1   MAGNESIUM CITRATE PO Take by mouth daily.     Melatonin 10 MG CAPS Take 20 mg by mouth at bedtime.     naproxen sodium (ALEVE) 220 MG tablet Take 440 mg by mouth daily as needed (headaches).     ondansetron (ZOFRAN) 8 MG tablet One pill every 8 hours as needed for nausea/vomitting. 40 tablet 1   pregabalin (LYRICA) 75 MG capsule TAKE 1 CAPSULE BY MOUTH TWICE A DAY 180 capsule 1   prochlorperazine (COMPAZINE) 10 MG tablet Take 1 tablet (10 mg total) by mouth every 6 (six) hours as needed for nausea or vomiting. 40 tablet 1   sulfamethoxazole-trimethoprim (BACTRIM) 400-80 MG tablet Take 1 tablet by mouth 2 (two) times daily. 14 tablet 0   traMADol (ULTRAM) 50 MG tablet TAKE 1 TABLET BY MOUTH EVERY 8 HOURS AS NEEDED. 90 tablet 0   senna (SENOKOT) 8.6 MG tablet Take 1 tablet (8.6 mg total) by mouth 2 (two) times daily as needed for constipation. 14 tablet 0   No current facility-administered medications for this visit.   Facility-Administered Medications Ordered in Other Visits  Medication Dose Route Frequency Provider Last Rate Last Admin   0.9 %  sodium chloride infusion   Intravenous Continuous Hughie Closs, PA-C   Stopped at 02/17/22 1214   0.9 %  sodium chloride infusion   Intravenous Continuous Hughie Closs, [place] 999 mL/hr at 03/12/22 1056 New Bag at 03/12/22 1056   heparin lock flush 100 unit/mL  500 Units Intracatheter Once PRN Cammie Sickle, MD        VITAL SIGNS: BP 117/73 (Patient Position: Sitting)   Pulse 81   Temp 98.2 F (36.8 C) (Oral)   Resp 17   Wt 175 lb (79.4 kg)   SpO2 100%   BMI 30.04 kg/m  Filed Weights   03/12/22 1019  Weight: 175 lb (79.4 kg)    Estimated body mass index is 30.04 kg/m as calculated from the following:   Height as of 02/27/22: 5\' 4"  (1.626 m).   Weight as of this encounter: 175 lb (79.4 kg).  LABS: CBC:    Component Value Date/Time   WBC 9.5 03/12/2022 0951   HGB 11.3 (L) 03/12/2022  0951   HCT 33.7 (L) 03/12/2022 0951   PLT 204 03/12/2022 0951   MCV 87.3 03/12/2022 0951   NEUTROABS 6.8 03/12/2022 0951   LYMPHSABS 1.8 03/12/2022 0951   MONOABS 0.8  03/12/2022 0951   EOSABS 0.1 03/12/2022 0951   BASOSABS 0.0 03/12/2022 0951   Comprehensive Metabolic Panel:    Component Value Date/Time   NA 133 (L) 03/12/2022 0951   K 4.9 03/12/2022 0951   CL 99 03/12/2022 0951   CO2 27 03/12/2022 0951   BUN 16 03/12/2022 0951   CREATININE 0.75 03/12/2022 0951   GLUCOSE 90 03/12/2022 0951   CALCIUM 8.8 (L) 03/12/2022 0951   AST 17 03/05/2022 1448   ALT 11 03/05/2022 1448   ALKPHOS 79 03/05/2022 1448   BILITOT 0.4 03/05/2022 1448   PROT 7.0 03/05/2022 1448   ALBUMIN 3.6 03/05/2022 1448    RADIOGRAPHIC STUDIES: IR IMAGING GUIDED PORT INSERTION  Result Date: 02/27/2022 INDICATION: 72 year old woman with history of lung cancer presents to IR for chest port placement EXAM: IMPLANTED PORT A CATH PLACEMENT WITH ULTRASOUND AND FLUOROSCOPIC GUIDANCE MEDICATIONS: None ANESTHESIA/SEDATION: Moderate (conscious) sedation was employed during this procedure. A total of Versed 2 mg and Fentanyl 100 mcg was administered intravenously by the radiology nurse. Total intra-service moderate Sedation Time: 50 minutes. The patient's level of consciousness and vital signs were monitored continuously by radiology nursing throughout the procedure under my direct supervision. FLUOROSCOPY: Radiation Exposure Index (as provided by the fluoroscopic device): 9.1 mGy Kerma COMPLICATIONS: None immediate. PROCEDURE: The procedure, risks, benefits, and alternatives were explained to the patient. Questions regarding the procedure were encouraged and answered. The patient understands and consents to the procedure. A timeout was performed prior to the initiation of the procedure. Patient positioned supine on the angiography table. Right neck and anterior upper chest prepped and draped in the usual sterile fashion. All  elements of maximal sterile barrier were utilized including, cap, mask, sterile gown, sterile gloves, large sterile drape, hand scrubbing and 2% Chlorhexidine for skin cleaning. The right internal jugular vein was evaluated with ultrasound and shown to be patent. A permanent ultrasound image was obtained and placed in the patient's medical record. Local anesthesia was provided with 1% lidocaine with epinephrine. Using sterile gel and a sterile probe cover, the right internal jugular vein was entered with a 21 ga needle during real time ultrasound guidance. 0.018 inch guidewire placed and 21 ga needle exchanged for transitional dilator set. Utilizing fluoroscopy, 0.035 inch guidewire advanced centrally without difficulty. Attention then turned to the right anterior upper chest. Following local lidocaine administration, a port pocket was created. The catheter was connected to the port and brought from the pocket to the venotomy site through a subcutaneous tunnel. The catheter was cut to size and inserted through the peel-away sheath. The catheter tip was positioned at the cavoatrial junction using fluoroscopic guidance. The port aspirated and flushed well. The port pocket was closed with deep and superficial absorbable suture. The port pocket incision and venotomy sites were also sealed with Dermabond. IMPRESSION: Successful placement of a right internal jugular approach power injectable Port-A-Cath. The catheter is ready for immediate use. Electronically Signed   By: Miachel Roux M.D.   On: 02/27/2022 14:39    PERFORMANCE STATUS (ECOG) : 1 - Symptomatic but completely ambulatory  Review of Systems Unless otherwise noted, a complete review of systems is negative.  Physical Exam General: NAD HEENT: Clear airway. No erythema of oropharynx. While film of tongue with scattered stuck on white debris of mouth Cardiovascular: regular rate and rhythm Pulmonary: clear ant fields Abdomen: soft, nontender, + bowel  sounds GU: no suprapubic tenderness Extremities: no edema, no joint deformities Skin: no rashes Neurological: Weakness but  otherwise nonfocal  Assessment and Plan- Patient is a 72 y.o. female    Encounter Diagnoses  Name Primary?   Oral thrush Yes   Dehydration    Constipation, unspecified constipation type    Acute intractable headache, unspecified headache type    Hypocalcemia    Sore Throat: Appears secondary to thrush. Treating with magic mouthwash after discussing with patient.   Dehydration/hyponatremia: Secondary to decreased oral intake due to thrush. IVF today. She will continue to increase her oral intake and follow up next week if needed. Weight down slightly but I think she will rebound well once we treat her for thrush as she states that her appetite is good and that her vomiting did not occur with this round of chemotherapy. If not I would suggest a referral to nutrition.  Hypocalcemia: New. Mild. Will monitor. Should improve with increased oral intake.   Anemia: Chronic secondary to chemotherapy. Stable with a value of 11.3 today.   Constipation: Secondary to chemotherapy and dehydration. 1L IVF today. Encouraged hydration, magnesium supplement, stool softeners, miralax. Discussed red flags.   Headache: Occurs following chemotherapy. May be secondary to chemo/dehydration. Given her diagnosis if IVF do not help her headache imaging should be considered. We discussed this today.   Patient expressed understanding and was in agreement with this plan. She also understands that She can call clinic at any time with any questions, concerns, or complaints.   Thank you for allowing me to participate in the care of this very pleasant patient.   Time Total: 25  Visit consisted of counseling and education dealing with the complex and emotionally intense issues of symptom management in the setting of serious illness.Greater than 50%  of this time was spent counseling and  coordinating care related to the above assessment and plan.  Signed by: Nelwyn Salisbury, PA-C

## 2022-03-13 ENCOUNTER — Other Ambulatory Visit: Payer: Self-pay | Admitting: Medical Oncology

## 2022-03-13 ENCOUNTER — Telehealth: Payer: Self-pay | Admitting: *Deleted

## 2022-03-13 ENCOUNTER — Other Ambulatory Visit: Payer: Self-pay | Admitting: Family Medicine

## 2022-03-13 ENCOUNTER — Other Ambulatory Visit: Payer: Self-pay | Admitting: Internal Medicine

## 2022-03-13 MED ORDER — NYSTATIN 100000 UNIT/ML MT SUSP: 5.0000 mL | Freq: Four times a day (QID) | OROMUCOSAL | 0 refills | Status: DC

## 2022-03-13 NOTE — Telephone Encounter (Signed)
Call returned to patient and informed that new prescription has been sent to pharmacy for her. She will call thenm to see if her insurance will cver new medicine and that pharmacy has It in stock.

## 2022-03-13 NOTE — Telephone Encounter (Signed)
Daughter called to report she was unable to get the mouthwash prescription filled she would like advice on what to do next.

## 2022-03-13 NOTE — Telephone Encounter (Signed)
Patient called reporting that [facility] cannot fill the Magic Mouth Wash and that she will not be able to afford it as it will cost her $150. She is asking if something else can be ordered. Please advise

## 2022-03-14 NOTE — Telephone Encounter (Signed)
CPE was on 06/17/21, pt has had a recent acute appt with another provider.   Both meds were filled on 06/10/21 #90 tabs with 2 refills

## 2022-03-17 ENCOUNTER — Other Ambulatory Visit: Payer: Self-pay | Admitting: Thoracic Surgery (Cardiothoracic Vascular Surgery)

## 2022-03-17 ENCOUNTER — Encounter: Payer: Self-pay | Admitting: Internal Medicine

## 2022-03-17 DIAGNOSIS — R911 Solitary pulmonary nodule: Secondary | ICD-10-CM

## 2022-03-18 ENCOUNTER — Ambulatory Visit: Payer: Medicare Other | Admitting: Thoracic Surgery (Cardiothoracic Vascular Surgery)

## 2022-03-18 ENCOUNTER — Telehealth: Payer: Self-pay | Admitting: Internal Medicine

## 2022-03-18 NOTE — Telephone Encounter (Signed)
On call page received-   Rosey Bath daughter called to report the patient is having cold symptoms- runny nose, sore throat and sneezing. No fevers or sob. Her grandson was sick. Patient wanted to know next steps.  Advised to reach out to PCP for COVID and respiratory viral panel. We do not check at the cancer center.   Please follow up the results to assess need for antivirals.   Cc. Dr. Blenda Bridegroom.

## 2022-03-20 ENCOUNTER — Encounter: Payer: Self-pay | Admitting: Internal Medicine

## 2022-03-25 ENCOUNTER — Encounter: Payer: Self-pay | Admitting: Thoracic Surgery (Cardiothoracic Vascular Surgery)

## 2022-03-25 ENCOUNTER — Ambulatory Visit (INDEPENDENT_AMBULATORY_CARE_PROVIDER_SITE_OTHER): Payer: Self-pay | Admitting: Thoracic Surgery (Cardiothoracic Vascular Surgery)

## 2022-03-25 ENCOUNTER — Ambulatory Visit
Admission: RE | Admit: 2022-03-25 | Discharge: 2022-03-25 | Disposition: A | Discharge status: Home / Self Care | Payer: Medicare Other | Source: Ambulatory Visit | Attending: Thoracic Surgery (Cardiothoracic Vascular Surgery) | Admitting: Thoracic Surgery (Cardiothoracic Vascular Surgery)

## 2022-03-25 VITALS — BP 155/73 | HR 79 | Resp 20 | Ht 64.0 in | Wt 178.0 lb

## 2022-03-25 DIAGNOSIS — R911 Solitary pulmonary nodule: Secondary | ICD-10-CM

## 2022-03-25 DIAGNOSIS — C3412 Malignant neoplasm of upper lobe, left bronchus or lung: Secondary | ICD-10-CM

## 2022-03-25 DIAGNOSIS — R0602 Shortness of breath: Secondary | ICD-10-CM | POA: Diagnosis not present

## 2022-03-25 NOTE — Progress Notes (Signed)
[address]       [place] [address]             [phone]     HPI: [name] [name] returns for a scheduled follow-up visit after a left upper lobectomy.  [name] [name] is a 72 year old woman with a history of tobacco abuse, fibromyalgia, hyperlipidemia, COPD, endometriosis, arthritis, postherpetic neuralgia, and depression. She was found to have a left upper lobe lung nodule that was hypermetabolic on PET.   I did a robotic assisted left upper lobectomy and node dissection on 12/12/2020. Pathology showed a T1, N1, stage IIb squamous cell carcinoma.  Postoperative course was complicated by mucous plugging.  I saw in the office in November.  She was having a fair amount of incisional pain.  She was on Lyrica for fibromyalgia.  We increased that from 75 mg once a day to 75 mg twice a day.  In the interim since her last visit she started chemotherapy.  She has had 2 cycles and is scheduled to have her third this Thursday.  She is not having any incisional pain.  No respiratory issues.  Past Medical History:  Diagnosis Date   Arthritis    OA   Carpal tunnel syndrome    Depression    Endometriosis    Fibromyalgia    Hyperlipidemia    Left upper lobe pulmonary nodule 11/2021   Post herpetic neuralgia    Sleep disorder    Tobacco abuse     Current Outpatient Medications  Medication Sig Dispense Refill   acetaminophen (TYLENOL) 500 MG tablet Take 500 mg by mouth every 6 (six) hours as needed.     aspirin 81 MG tablet Take 81 mg by mouth daily.     busPIRone (BUSPAR) 15 MG tablet TAKE 1/2 TABLET BY MOUTH TWICE A DAY 90 tablet 1   Cobalamin Combinations (NEURIVA PLUS PO) Take by mouth.     dexamethasone (DECADRON) 4 MG tablet Take 1 tablet (4 mg total) by mouth 2 (two) times daily with a meal. Start 2 days prior to chemo. Take for 2 days; DO NOT take on the day of chemo. 60 tablet 0   docusate sodium (COLACE) 100 MG capsule Take 200 mg by mouth daily.      DULoxetine (CYMBALTA) 60 MG capsule TAKE 1 CAPSULE BY MOUTH EVERY DAY 90 capsule 1   Fluticasone-Umeclidin-Vilant (TRELEGY ELLIPTA) 100-62.5-25 MCG/ACT AEPB Inhale 1 puff into the lungs daily. 28 each 11   lidocaine-prilocaine (EMLA) cream Apply on the port. 30 -45 min  prior to port access. 30 g 3   magic mouthwash (nystatin, lidocaine, diphenhydrAMINE, alum & mag hydroxide) suspension Swish and spit 5 mLs 4 (four) times daily as needed for mouth pain. 240 mL 1   MAGNESIUM CITRATE PO Take by mouth daily.     Melatonin 10 MG CAPS Take 20 mg by mouth at bedtime.     naproxen sodium (ALEVE) 220 MG tablet Take 440 mg by mouth daily as needed (headaches).     nystatin (MYCOSTATIN) 100000 UNIT/ML suspension Take 5 mLs (500,000 Units total) by mouth 4 (four) times daily. 60 mL 0   ondansetron (ZOFRAN) 8 MG tablet One pill every 8 hours as needed for nausea/vomitting. 40 tablet 1   pregabalin (LYRICA) 75 MG capsule TAKE 1 CAPSULE BY MOUTH TWICE A DAY 180 capsule 1   prochlorperazine (COMPAZINE) 10 MG tablet Take 1 tablet (10 mg total) by mouth every 6 (six) hours  as needed for nausea or vomiting. 40 tablet 1   senna (SENOKOT) 8.6 MG tablet Take 1 tablet (8.6 mg total) by mouth 2 (two) times daily as needed for constipation. 14 tablet 0   traMADol (ULTRAM) 50 MG tablet TAKE 1 TABLET BY MOUTH EVERY 8 HOURS AS NEEDED. 90 tablet 0   No current facility-administered medications for this visit.   Facility-Administered Medications Ordered in Other Visits  Medication Dose Route Frequency Provider Last Rate Last Admin   0.9 %  sodium chloride infusion   Intravenous Continuous Rushie Chestnut, PA-C   Stopped at 02/17/22 1214    Physical Exam BP (!) 155/73 (BP Location: Right Arm, Patient Position: Sitting, Cuff Size: Normal)   Pulse 79   Resp 20   Ht 5\' 4"  (1.626 m)   Wt 178 lb (80.7 kg)   SpO2 95% Comment: RA  BMI 30.14 kg/m  72 year old woman in no acute distress Alert and oriented x 3 with no  focal deficits Alopecia Lungs absent breath sounds left base, otherwise clear Incisions well-healed  Diagnostic Tests: CHEST - 2 VIEW   COMPARISON:  Chest two views 03/16/2021, 12/24/2021, 12/21/2021; CT chest 12/15/2021   FINDINGS: Right chest wall porta catheter tip overlies the central superior vena cava, new from 01/14/2022. This was placed 02/27/2022, seen on intra procedural fluoroscopy images on PACS.   Mild calcification within the aortic arch. The visualized cardiac silhouette is unremarkable.   Slight improvement in mild-to-moderate left pleural effusion. The right lung is clear. No pneumothorax is seen. Mild-to-moderate multilevel degenerative disc changes of the thoracic spine.   IMPRESSION: Right chest wall porta catheter tip overlies the central superior vena cava, new from 01/14/2022.   Slight improvement in mild-to-moderate left pleural effusion.     Electronically Signed   By: 01/16/2022 M.D.   On: 03/25/2022 11:34 Improved aeration at left base.  Very small effusion with some volume loss due to lobectomy and likely some mild elevation of the left hemidiaphragm.  Impression: [name] [name] is a 72 year old woman with a history of tobacco abuse, fibromyalgia, hyperlipidemia, COPD, endometriosis, arthritis, postherpetic neuralgia, depression, and a stage IIb squamous cell carcinoma of the lung.  Stage IIb squamous cell carcinoma of the lung-status post left upper lobectomy in October 2023.  Currently undergoing adjuvant chemotherapy with Dr. November 2023.  She is not having any incisional pain.  Her incisions are well-healed.  She does have some volume loss with some elevation of the left hemidiaphragm that has improved from her last film along with a very small pleural effusion.   Plan: Continue to follow-up with Dr. Donneta Romberg and Dr. Donneta Romberg I will be happy to see [name] [name] back anytime in the future if I can be of any further assistance with her  care  Earl Lites, MD [facility] [phone]

## 2022-03-26 ENCOUNTER — Inpatient Hospital Stay: Payer: Medicare Other

## 2022-03-26 ENCOUNTER — Encounter: Payer: Self-pay | Admitting: Internal Medicine

## 2022-03-26 ENCOUNTER — Inpatient Hospital Stay (HOSPITAL_BASED_OUTPATIENT_CLINIC_OR_DEPARTMENT_OTHER): Payer: Medicare Other | Admitting: Internal Medicine

## 2022-03-26 VITALS — BP 129/74 | HR 73 | Temp 97.8°F | Resp 18 | Wt 180.4 lb

## 2022-03-26 DIAGNOSIS — Z5111 Encounter for antineoplastic chemotherapy: Secondary | ICD-10-CM | POA: Diagnosis not present

## 2022-03-26 DIAGNOSIS — C3412 Malignant neoplasm of upper lobe, left bronchus or lung: Secondary | ICD-10-CM

## 2022-03-26 DIAGNOSIS — Z902 Acquired absence of lung [part of]: Secondary | ICD-10-CM | POA: Diagnosis not present

## 2022-03-26 DIAGNOSIS — F17210 Nicotine dependence, cigarettes, uncomplicated: Secondary | ICD-10-CM | POA: Diagnosis not present

## 2022-03-26 DIAGNOSIS — Z5189 Encounter for other specified aftercare: Secondary | ICD-10-CM | POA: Diagnosis not present

## 2022-03-26 DIAGNOSIS — R112 Nausea with vomiting, unspecified: Secondary | ICD-10-CM | POA: Diagnosis not present

## 2022-03-26 LAB — CBC WITH DIFFERENTIAL/PLATELET
Abs Immature Granulocytes: 0.12 10*3/uL — ABNORMAL HIGH (ref 0.00–0.07)
Basophils Absolute: 0 10*3/uL (ref 0.0–0.1)
Basophils Relative: 0 %
Eosinophils Absolute: 0 10*3/uL (ref 0.0–0.5)
Eosinophils Relative: 0 %
HCT: 34.9 % — ABNORMAL LOW (ref 36.0–46.0)
Hemoglobin: 11.1 g/dL — ABNORMAL LOW (ref 12.0–15.0)
Immature Granulocytes: 1 %
Lymphocytes Relative: 9 %
Lymphs Abs: 1.1 10*3/uL (ref 0.7–4.0)
MCH: 29.8 pg (ref 26.0–34.0)
MCHC: 31.8 g/dL (ref 30.0–36.0)
MCV: 93.8 fL (ref 80.0–100.0)
Monocytes Absolute: 0.5 10*3/uL (ref 0.1–1.0)
Monocytes Relative: 4 %
Neutro Abs: 10.6 10*3/uL — ABNORMAL HIGH (ref 1.7–7.7)
Neutrophils Relative %: 86 %
Platelets: 225 10*3/uL (ref 150–400)
RBC: 3.72 MIL/uL — ABNORMAL LOW (ref 3.87–5.11)
RDW: 17.6 % — ABNORMAL HIGH (ref 11.5–15.5)
WBC: 12.3 10*3/uL — ABNORMAL HIGH (ref 4.0–10.5)
nRBC: 0 % (ref 0.0–0.2)

## 2022-03-26 LAB — COMPREHENSIVE METABOLIC PANEL
ALT: 13 U/L (ref 0–44)
AST: 17 U/L (ref 15–41)
Albumin: 3.7 g/dL (ref 3.5–5.0)
Alkaline Phosphatase: 103 U/L (ref 38–126)
Anion gap: 10 (ref 5–15)
BUN: 17 mg/dL (ref 8–23)
CO2: 25 mmol/L (ref 22–32)
Calcium: 9.7 mg/dL (ref 8.9–10.3)
Chloride: 99 mmol/L (ref 98–111)
Creatinine, Ser: 0.64 mg/dL (ref 0.44–1.00)
GFR, Estimated: >60 mL/min (ref >60)
Glucose, Bld: 118 mg/dL — ABNORMAL HIGH (ref 70–99)
Potassium: 5.1 mmol/L (ref 3.5–5.1)
Sodium: 134 mmol/L — ABNORMAL LOW (ref 135–145)
Total Bilirubin: 0.6 mg/dL (ref 0.3–1.2)
Total Protein: 7.2 g/dL (ref 6.5–8.1)

## 2022-03-26 LAB — MAGNESIUM: Magnesium: 2.1 mg/dL (ref 1.7–2.4)

## 2022-03-26 MED FILL — Fosaprepitant Dimeglumine For IV Infusion 150 MG (Base Eq): INTRAVENOUS | Qty: 5 | Status: AC

## 2022-03-26 MED FILL — Dexamethasone Sodium Phosphate Inj 100 MG/10ML: INTRAMUSCULAR | Qty: 1 | Status: AC

## 2022-03-26 NOTE — Progress Notes (Signed)
Doing well. Chemo tx is tomorrow.

## 2022-03-26 NOTE — Progress Notes (Signed)
[facility] CONSULT NOTE  Patient Care Team: Tower, [name], MD as PCP - General Telford Nab, RN as Oncology Nurse Navigator Cammie Sickle, MD as Consulting Physician (Internal Medicine)  CHIEF COMPLAINTS/PURPOSE OF CONSULTATION: lung cancer  #  Oncology History Overview Note  JULY 2023- 1. 10 mm mean diameter nodule in the left upper lobe adjacent to an area of pleural and parenchymal scarring.     SEP 2023- 1. Hypermetabolic spiculated nodular left upper lobe pulmonary lesion is compatible with primary bronchogenic neoplasm. 2. Mildly metabolic nodularity in the left hilum, without discrete adenopathy may reflect early nodal disease involvement. Suggest attention on follow-up imaging. 3. No evidence of distant hypermetabolic metastatic disease. 4. Hypermetabolic nodule in the left lobe of the thyroid, Recommend thyroid US and biopsy (ref: J Am Coll Radiol. 2015 Feb;12(2): 143-50). 5. Aortic Atherosclerosis (ICD10-I70.0) and Emphysema (ICD10-J43.9).  # LUNG: Resection  Synchronous Tumors: Not applicable Total Number of Primary Tumors: 1 Procedure: Lobectomy, lung Specimen Laterality: Left Tumor Focality: Unifocal Tumor Site: Upper lobe Tumor Size: 2.6 cm Histologic Type: Squamous cell carcinoma, moderately differentiated Visceral Pleura Invasion: Not identified Direct Invasion of Adjacent Structures: No adjacent structures present Lymphovascular Invasion: Not identified Margins: All margins negative for invasive carcinoma      Closest Margin(s) to Invasive Carcinoma: Bronchovascular margin      Margin(s) Involved by Invasive Carcinoma: Not applicable       Margin Status for Non-Invasive Tumor: Negative Treatment Effect: No known presurgical therapy Regional Lymph Nodes:      Number of Lymph Nodes Involved: 1                           Nodal Sites with Tumor: Level 10      Number of Lymph Nodes Examined: 21                      Nodal Sites  Examined: Levels 4, 5, 7, 9, 10, 11, 12 and 13 Distant Metastasis:      Distant Site(s) Involved: Not applicable Pathologic Stage Classification (pTNM, AJCC 8th Edition): pT1c, pN1 Ancillary Studies: Can be performed upon request Representative Tumor Block: B5 Comment(s): None  # DEC 14th, 2023- Cisplatin-Taxotere #1; D-2 [name] x4 cycles    Lung cancer ([place])  12/05/2021 Initial Diagnosis   Lung cancer ([place])   12/18/2021 Cancer Staging   Staging form: Lung, AJCC 8th Edition - Pathologic stage from 12/18/2021: Stage IIB (pT1c, pN1, cM0) - Signed by [name], MD on 12/18/2021 Histopathologic type: Squamous cell carcinoma, NOS   Cancer of upper lobe of left lung ([place])  01/03/2022 Initial Diagnosis   Cancer of upper lobe of left lung ([place])   01/03/2022 Cancer Staging   Staging form: Lung, AJCC 8th Edition - Pathologic: Stage IIB (pT1c, pN1, cM0) - Signed by Cammie Sickle, MD on 01/03/2022   02/13/2022 -  Chemotherapy   Patient is on Treatment Plan : LUNG Cisplatin + Docetaxel q21d      HISTORY OF PRESENTING ILLNESS: Ambulating independently.  Accompanied by daughter.  [name] [name] 72 y.o.  female history of smoking with stage II T2 N1 squamous cell carcinoma left lung cancer currently on adjuvant chemotherapy with cisplatin and Taxotere is here for follow-up.  Patient noted to have URI symptoms post last chemotherapy.  Also had mild to moderate nausea.  Currently  Doing well. Chemo tx is tomorrow.    Denies any  significant leg swelling.  Denies any tingling or numbness.  Patient is recovering well.  Denies any worsening pain.  Denies any worsening shortness of breath.  Review of Systems  Constitutional:  Negative for chills, diaphoresis, fever, malaise/fatigue and weight loss.  HENT:  Negative for nosebleeds and sore throat.   Eyes:  Negative for double vision.  Respiratory:  Negative for cough, hemoptysis, sputum production, shortness of breath and  wheezing.   Cardiovascular:  Negative for chest pain, palpitations, orthopnea and leg swelling.  Gastrointestinal:  Negative for abdominal pain, blood in stool, constipation, diarrhea, heartburn, melena, nausea and vomiting.  Genitourinary:  Negative for dysuria, frequency and urgency.  Musculoskeletal:  Negative for back pain and joint pain.  Skin: Negative.  Negative for itching and rash.  Neurological:  Negative for dizziness, tingling, focal weakness, weakness and headaches.  Endo/Heme/Allergies:  Does not bruise/bleed easily.  Psychiatric/Behavioral:  Negative for depression. The patient is not nervous/anxious and does not have insomnia.      MEDICAL HISTORY:  Past Medical History:  Diagnosis Date   Arthritis    OA   Carpal tunnel syndrome    Depression    Endometriosis    Fibromyalgia    Hyperlipidemia    Left upper lobe pulmonary nodule 11/2021   Post herpetic neuralgia    Sleep disorder    Tobacco abuse     SURGICAL HISTORY: Past Surgical History:  Procedure Laterality Date   ABDOMINAL HYSTERECTOMY  1992   total endometriosis   APPENDECTOMY  1992   bladder tack  1992   CATARACT EXTRACTION W/PHACO Right 03/25/2018   Procedure: CATARACT EXTRACTION PHACO AND INTRAOCULAR LENS PLACEMENT ([place]) RIGHT;  Surgeon: Marchia Meiers, MD;  Location: [facility];  Service: Ophthalmology;  Laterality: Right;  [place]  01:11 CDE 8.02 Fluid pack lot # [id number]   CATARACT EXTRACTION W/PHACO Left 04/22/2018   Procedure: CATARACT EXTRACTION PHACO AND INTRAOCULAR LENS PLACEMENT ([place]) Left Eye;  Surgeon: Marchia Meiers, MD;  Location: [facility];  Service: Ophthalmology;  Laterality: Left;  [place]  01:02 CDE 8.11 Fluid pack lot # [id number]   COLONOSCOPY     FOOT SURGERY Right 04/2016   [place] in [place] great toe; straightening others   INTERCOSTAL NERVE BLOCK Left 12/12/2021   Procedure: INTERCOSTAL NERVE BLOCK;  Surgeon: [name], MD;  Location: [facility];  Service: Thoracic;   Laterality: Left;   IR IMAGING GUIDED PORT INSERTION  02/27/2022   LYMPH NODE DISSECTION Left 12/12/2021   Procedure: LYMPH NODE DISSECTION;  Surgeon: [name], MD;  Location: [facility];  Service: Thoracic;  Laterality: Left;   VIDEO BRONCHOSCOPY N/A 12/16/2021   Procedure: VIDEO BRONCHOSCOPY;  Surgeon: [name], MD;  Location: [facility];  Service: Thoracic;  Laterality: N/A;    SOCIAL HISTORY: Social History   Socioeconomic History   Marital status: Widowed    Spouse name: Not on file   Number of children: 2   Years of education: Not on file   Highest education level: Not on file  Occupational History   Not on file  Tobacco Use   Smoking status: Former    Packs/day: 1.00    Years: 55.00    Total pack years: 55.00    Types: Cigarettes    Quit date: 12/03/2021    Years since quitting: 0.3   Smokeless tobacco: Never  Vaping Use   Vaping Use: Some days  Substance and Sexual Activity   Alcohol use: No    Alcohol/week:  0.0 standard drinks of alcohol   Drug use: No   Sexual activity: Not Currently  Other Topics Concern   Not on file  Social History Narrative   Lives alone   Social Determinants of Health   Financial Resource Strain: Low Risk  (02/01/2021)   Overall Financial Resource Strain (CARDIA)    Difficulty of Paying Living Expenses: Not hard at all  Food Insecurity: No Food Insecurity (02/01/2021)   Hunger Vital Sign    Worried About Running Out of Food in the Last Year: Never true    Ran Out of Food in the Last Year: Never true  Transportation Needs: No Transportation Needs (02/18/2022)   PRAPARE - Hydrologist (Medical): No    Lack of Transportation (Non-Medical): No  Physical Activity: Inactive (02/01/2021)   Exercise Vital Sign    Days of Exercise per Week: 0 days    Minutes of Exercise per Session: 0 min  Stress: No Stress Concern Present (02/01/2021)   [place]    Feeling of Stress : Not at all  Social Connections: [place] (02/01/2021)   Social Connection and Isolation Panel [NHANES]    Frequency of Communication with Friends and Family: More than three times a week    Frequency of Social Gatherings with Friends and Family: More than three times a week    Attends Religious Services: More than 4 times per year    Active Member of [employer] or Organizations: Yes    Attends Music therapist: More than 4 times per year    Marital Status: Married  Human resources officer Violence: Not At Risk (02/01/2021)   Humiliation, Afraid, Rape, and Kick questionnaire    Fear of Current or Ex-Partner: No    Emotionally Abused: No    Physically Abused: No    Sexually Abused: No    FAMILY HISTORY: Family History  Problem Relation Age of Onset   Heart disease Mother        MI   Heart disease Father        MI   Arthritis Sister        crippling arthritis   Arthritis Brother        crippling arthritis    ALLERGIES:  has No Known Allergies.  MEDICATIONS:  Current Outpatient Medications  Medication Sig Dispense Refill   acetaminophen (TYLENOL) 500 MG tablet Take 500 mg by mouth every 6 (six) hours as needed.     aspirin 81 MG tablet Take 81 mg by mouth daily.     busPIRone (BUSPAR) 15 MG tablet TAKE 1/2 TABLET BY MOUTH TWICE A DAY 90 tablet 1   Cobalamin Combinations (NEURIVA PLUS PO) Take by mouth.     dexamethasone (DECADRON) 4 MG tablet Take 1 tablet (4 mg total) by mouth 2 (two) times daily with a meal. Start 2 days prior to chemo. Take for 2 days; DO NOT take on the day of chemo. 60 tablet 0   docusate sodium (COLACE) 100 MG capsule Take 200 mg by mouth daily.     DULoxetine (CYMBALTA) 60 MG capsule TAKE 1 CAPSULE BY MOUTH EVERY DAY 90 capsule 1   Fluticasone-Umeclidin-Vilant (TRELEGY ELLIPTA) 100-62.5-25 MCG/ACT AEPB Inhale 1 puff into the lungs daily. 28 each 11   lidocaine-prilocaine (EMLA) cream Apply on the  port. 30 -45 min  prior to port access. 30 g 3   MAGNESIUM CITRATE PO Take by mouth daily.  Melatonin 10 MG CAPS Take 20 mg by mouth at bedtime.     nystatin (MYCOSTATIN) 100000 UNIT/ML suspension Take 5 mLs (500,000 Units total) by mouth 4 (four) times daily. 60 mL 0   ondansetron (ZOFRAN) 8 MG tablet One pill every 8 hours as needed for nausea/vomitting. 40 tablet 1   pregabalin (LYRICA) 75 MG capsule TAKE 1 CAPSULE BY MOUTH TWICE A DAY 180 capsule 1   prochlorperazine (COMPAZINE) 10 MG tablet Take 1 tablet (10 mg total) by mouth every 6 (six) hours as needed for nausea or vomiting. 40 tablet 1   senna (SENOKOT) 8.6 MG tablet Take 1 tablet (8.6 mg total) by mouth 2 (two) times daily as needed for constipation. 14 tablet 0   traMADol (ULTRAM) 50 MG tablet TAKE 1 TABLET BY MOUTH EVERY 8 HOURS AS NEEDED. 90 tablet 0   magic mouthwash (nystatin, lidocaine, diphenhydrAMINE, alum & mag hydroxide) suspension Swish and spit 5 mLs 4 (four) times daily as needed for mouth pain. (Patient not taking: Reported on 03/26/2022) 240 mL 1   naproxen sodium (ALEVE) 220 MG tablet Take 440 mg by mouth daily as needed (headaches). (Patient not taking: Reported on 03/26/2022)     No current facility-administered medications for this visit.   Facility-Administered Medications Ordered in Other Visits  Medication Dose Route Frequency Provider Last Rate Last Admin   0.9 %  sodium chloride infusion   Intravenous Continuous Hughie Closs, PA-C   Stopped at 02/17/22 1214      .  PHYSICAL EXAMINATION: ECOG PERFORMANCE STATUS: 0 - Asymptomatic  Vitals:   03/26/22 1507  BP: 129/74  Pulse: 73  Resp: 18  Temp: 97.8 F (36.6 C)  SpO2: 97%   Filed Weights   03/26/22 1507  Weight: 180 lb 6.4 oz (81.8 kg)    Physical Exam Vitals and nursing note reviewed.  Constitutional:      Comments:     HENT:     Head: Normocephalic and atraumatic.     Mouth/Throat:     Mouth: Mucous membranes are moist.      Pharynx: No oropharyngeal exudate.  Eyes:     Pupils: Pupils are equal, round, and reactive to light.  Cardiovascular:     Rate and Rhythm: Normal rate and regular rhythm.  Pulmonary:     Effort: No respiratory distress.     Breath sounds: No wheezing.     Comments: Decreased breath sounds bilaterally at bases.  No wheeze or crackles Abdominal:     General: Bowel sounds are normal. There is no distension.     Palpations: Abdomen is soft. There is no mass.     Tenderness: There is no abdominal tenderness. There is no guarding or rebound.  Musculoskeletal:        General: No tenderness. Normal range of motion.     Cervical back: Normal range of motion and neck supple.  Skin:    General: Skin is warm.  Neurological:     Mental Status: She is alert and oriented to person, place, and time.  Psychiatric:        Mood and Affect: Affect normal.        Judgment: Judgment normal.      LABORATORY DATA:  I have reviewed the data as listed Lab Results  Component Value Date   WBC 12.3 (H) 03/26/2022   HGB 11.1 (L) 03/26/2022   HCT 34.9 (L) 03/26/2022   MCV 93.8 03/26/2022   PLT 225 03/26/2022  Recent Labs    02/18/22 0903 03/05/22 1448 03/12/22 0951 03/26/22 1452  NA 136 135 133* 134*  K 3.7 4.5 4.9 5.1  CL 103 104 99 99  CO2 25 23 27 25   GLUCOSE 114* 165* 90 118*  BUN 20 14 16 17   CREATININE 0.63 0.82 0.75 0.64  CALCIUM 9.1 9.1 8.8* 9.7  GFRNONAA >60 >60 >60 >60  PROT 7.1 7.0  --  7.2  ALBUMIN 3.8 3.6  --  3.7  AST 21 17  --  17  ALT 24 11  --  13  ALKPHOS 101 79  --  103  BILITOT 0.8 0.4  --  0.6    RADIOGRAPHIC STUDIES: I have personally reviewed the radiological images as listed and agreed with the findings in the report. DG Chest 2 View  Result Date: 03/25/2022 CLINICAL DATA:  Lung nodule. History of lymph node dissection 12/13/2019. Some shortness of breath. History of left upper lobectomy or primary lung cancer. EXAM: CHEST - 2 VIEW COMPARISON:  Chest two  views 03/16/2021, 12/24/2021, 12/21/2021; CT chest 12/15/2021 FINDINGS: Right chest wall porta catheter tip overlies the central superior vena cava, new from 01/14/2022. This was placed 02/27/2022, seen on intra procedural fluoroscopy images on PACS. Mild calcification within the aortic arch. The visualized cardiac silhouette is unremarkable. Slight improvement in mild-to-moderate left pleural effusion. The right lung is clear. No pneumothorax is seen. Mild-to-moderate multilevel degenerative disc changes of the thoracic spine. IMPRESSION: Right chest wall porta catheter tip overlies the central superior vena cava, new from 01/14/2022. Slight improvement in mild-to-moderate left pleural effusion. Electronically Signed   By: Yvonne Kendall M.D.   On: 03/25/2022 11:34   IR IMAGING GUIDED PORT INSERTION  Result Date: 02/27/2022 INDICATION: 72 year old woman with history of lung cancer presents to IR for chest port placement EXAM: IMPLANTED PORT A CATH PLACEMENT WITH ULTRASOUND AND FLUOROSCOPIC GUIDANCE MEDICATIONS: None ANESTHESIA/SEDATION: Moderate (conscious) sedation was employed during this procedure. A total of Versed 2 mg and Fentanyl 100 mcg was administered intravenously by the radiology nurse. Total intra-service moderate Sedation Time: 50 minutes. The patient's level of consciousness and vital signs were monitored continuously by radiology nursing throughout the procedure under my direct supervision. FLUOROSCOPY: Radiation Exposure Index (as provided by the fluoroscopic device): 9.1 mGy Kerma COMPLICATIONS: None immediate. PROCEDURE: The procedure, risks, benefits, and alternatives were explained to the patient. Questions regarding the procedure were encouraged and answered. The patient understands and consents to the procedure. A timeout was performed prior to the initiation of the procedure. Patient positioned supine on the angiography table. Right neck and anterior upper chest prepped and draped in the  usual sterile fashion. All elements of maximal sterile barrier were utilized including, cap, mask, sterile gown, sterile gloves, large sterile drape, hand scrubbing and 2% Chlorhexidine for skin cleaning. The right internal jugular vein was evaluated with ultrasound and shown to be patent. A permanent ultrasound image was obtained and placed in the patient's medical record. Local anesthesia was provided with 1% lidocaine with epinephrine. Using sterile gel and a sterile probe cover, the right internal jugular vein was entered with a 21 ga needle during real time ultrasound guidance. 0.018 inch guidewire placed and 21 ga needle exchanged for transitional dilator set. Utilizing fluoroscopy, 0.035 inch guidewire advanced centrally without difficulty. Attention then turned to the right anterior upper chest. Following local lidocaine administration, a port pocket was created. The catheter was connected to the port and brought from the pocket to the venotomy  site through a subcutaneous tunnel. The catheter was cut to size and inserted through the peel-away sheath. The catheter tip was positioned at the cavoatrial junction using fluoroscopic guidance. The port aspirated and flushed well. The port pocket was closed with deep and superficial absorbable suture. The port pocket incision and venotomy sites were also sealed with Dermabond. IMPRESSION: Successful placement of a right internal jugular approach power injectable Port-A-Cath. The catheter is ready for immediate use. Electronically Signed   By: Miachel Roux M.D.   On: 02/27/2022 14:39    ASSESSMENT & PLAN:   Cancer of upper lobe of left lung ([place]) # Left upper lobe-lung cancer moderately differentiated squamous cell carcinoma-pT1pN1- [IIB]; PDL-1 =70%. EGFR status-WILD.   adjuvant chemotherapy-cisplatin Taxotere x 4 cycles; followed by adjuvant Atezo/Pembrolizumab x1 year.   # Proceed with cycle # 3 of planned #4- adjuvant chemotherapy cisplatin-Taxotere every  3 weeks. Labs today reviewed;  acceptable for treatment today. Will repeat CT san after cycle #4; and plan immunotherapy in 1 month later.   # Nausea/vomiting-secondary chemotherapy-improved with IV fluids.  Improved; continue dexamethasone as ordered.  If recurrent would recommend Zyprexa low-dose.  Plan IV fluids post udenyca.   # IV Access: port  # DISPOSITION:  # chemo as planned tomorrow  # on 1/26;  IVFs 1 lit- with udenyca   # follow up in 1 week- APP;labs- cbc/bmp;mag; possible IVFs over 1 hour  # pt requesting AM appts- Follow up in 3 weeks-WED- MD;port- labs- cbc/cmp;mag; Thursday-D-2 Cispatin-Taxotere; D-2 Udenyca/IVFs over 1 hour- Dr.B   All questions were answered. The patient knows to call the clinic with any problems, questions or concerns.    Cammie Sickle, MD 03/26/2022 4:00 PM

## 2022-03-26 NOTE — Assessment & Plan Note (Addendum)
#  Left upper lobe-lung cancer moderately differentiated squamous cell carcinoma-pT1pN1- [IIB]; PDL-1 =70%. EGFR status-WILD.   adjuvant chemotherapy-cisplatin Taxotere x 4 cycles; followed by adjuvant Atezo/Pembrolizumab x1 year.   # Proceed with cycle # 3 of planned #4- adjuvant chemotherapy cisplatin-Taxotere every 3 weeks. Labs today reviewed;  acceptable for treatment today. Will repeat CT san after cycle #4; and plan immunotherapy in 1 month later.   # Nausea/vomiting-secondary chemotherapy-improved with IV fluids.  Improved; continue dexamethasone as ordered.  If recurrent would recommend Zyprexa low-dose.  Plan IV fluids post udenyca.   # IV Access: port  # DISPOSITION:  # chemo as planned tomorrow  # on 1/26;  IVFs 1 lit- with udenyca   # follow up in 1 week- APP;labs- cbc/bmp;mag; possible IVFs over 1 hour  # pt requesting AM appts- Follow up in 3 weeks-WED- MD;port- labs- cbc/cmp;mag; Thursday-D-2 Cispatin-Taxotere; D-2 Udenyca/IVFs over 1 hour- Dr.B

## 2022-03-27 ENCOUNTER — Inpatient Hospital Stay: Payer: Medicare Other

## 2022-03-27 ENCOUNTER — Other Ambulatory Visit: Payer: Self-pay

## 2022-03-27 VITALS — BP 152/76 | HR 72 | Temp 96.9°F | Resp 18

## 2022-03-27 DIAGNOSIS — Z5189 Encounter for other specified aftercare: Secondary | ICD-10-CM | POA: Diagnosis not present

## 2022-03-27 DIAGNOSIS — C3412 Malignant neoplasm of upper lobe, left bronchus or lung: Secondary | ICD-10-CM

## 2022-03-27 DIAGNOSIS — R519 Headache, unspecified: Secondary | ICD-10-CM

## 2022-03-27 DIAGNOSIS — Z902 Acquired absence of lung [part of]: Secondary | ICD-10-CM | POA: Diagnosis not present

## 2022-03-27 DIAGNOSIS — R112 Nausea with vomiting, unspecified: Secondary | ICD-10-CM | POA: Diagnosis not present

## 2022-03-27 DIAGNOSIS — Z5111 Encounter for antineoplastic chemotherapy: Secondary | ICD-10-CM | POA: Diagnosis not present

## 2022-03-27 DIAGNOSIS — F17210 Nicotine dependence, cigarettes, uncomplicated: Secondary | ICD-10-CM | POA: Diagnosis not present

## 2022-03-27 MED ORDER — SODIUM CHLORIDE 0.9 % IV SOLN
Freq: Once | INTRAVENOUS | Status: AC
  Filled 2022-03-27: qty 250

## 2022-03-27 MED ORDER — HEPARIN SOD (PORK) LOCK FLUSH 100 UNIT/ML IV SOLN
500.0000 [IU] | Freq: Once | INTRAVENOUS | Status: DC | PRN
  Filled 2022-03-27: qty 5

## 2022-03-27 MED ORDER — SODIUM CHLORIDE 0.9 % IV SOLN
142.0000 mg | Freq: Once | INTRAVENOUS | Status: AC
  Administered 2022-03-27: 142 mg via INTRAVENOUS
  Filled 2022-03-27: qty 142

## 2022-03-27 MED ORDER — POTASSIUM CHLORIDE IN NACL 20-0.9 MEQ/L-% IV SOLN
Freq: Once | INTRAVENOUS | Status: AC
  Filled 2022-03-27: qty 1000

## 2022-03-27 MED ORDER — ACETAMINOPHEN 325 MG PO TABS
650.0000 mg | ORAL_TABLET | Freq: Once | ORAL | Status: AC
  Administered 2022-03-27: 650 mg via ORAL
  Filled 2022-03-27: qty 2

## 2022-03-27 MED ORDER — SODIUM CHLORIDE 0.9 % IV SOLN
150.0000 mg | Freq: Once | INTRAVENOUS | Status: AC
  Administered 2022-03-27: 150 mg via INTRAVENOUS
  Filled 2022-03-27: qty 150

## 2022-03-27 MED ORDER — HEPARIN SOD (PORK) LOCK FLUSH 100 UNIT/ML IV SOLN
INTRAVENOUS | Status: AC
  Administered 2022-03-27: 500 [IU]
  Filled 2022-03-27: qty 5

## 2022-03-27 MED ORDER — PALONOSETRON HCL INJECTION 0.25 MG/5ML
0.2500 mg | Freq: Once | INTRAVENOUS | Status: AC
  Administered 2022-03-27: 0.25 mg via INTRAVENOUS
  Filled 2022-03-27: qty 5

## 2022-03-27 MED ORDER — MAGNESIUM SULFATE 2 GM/50ML IV SOLN
2.0000 g | Freq: Once | INTRAVENOUS | Status: AC
  Administered 2022-03-27: 2 g via INTRAVENOUS
  Filled 2022-03-27: qty 50

## 2022-03-27 MED ORDER — SODIUM CHLORIDE 0.9 % IV SOLN
75.0000 mg/m2 | Freq: Once | INTRAVENOUS | Status: AC
  Administered 2022-03-27: 140 mg via INTRAVENOUS
  Filled 2022-03-27: qty 14

## 2022-03-27 MED ORDER — SODIUM CHLORIDE 0.9 % IV SOLN
10.0000 mg | Freq: Once | INTRAVENOUS | Status: AC
  Administered 2022-03-27: 10 mg via INTRAVENOUS
  Filled 2022-03-27: qty 10

## 2022-03-27 NOTE — Patient Instructions (Signed)
[facility]  Discharge Instructions: Thank you for choosing [facility] to provide your oncology and hematology care.  If you have a lab appointment with the [facility], please go directly to the [facility] and check in at the registration area.  Wear comfortable clothing and clothing appropriate for easy access to any Portacath or PICC line.   We strive to give you quality time with your provider. You may need to reschedule your appointment if you arrive late (15 or more minutes).  Arriving late affects you and other patients whose appointments are after yours.  Also, if you miss three or more appointments without notifying the office, you may be dismissed from the clinic at the provider's discretion.      For prescription refill requests, have your pharmacy contact our office and allow 72 hours for refills to be completed.    Today you received the following chemotherapy and/or immunotherapy agents Taxotere and Cisplatin        To help prevent nausea and vomiting after your treatment, we encourage you to take your nausea medication as directed.  BELOW ARE SYMPTOMS THAT SHOULD BE REPORTED IMMEDIATELY: *FEVER GREATER THAN 100.4 F (38 C) OR HIGHER *CHILLS OR SWEATING *NAUSEA AND VOMITING THAT IS NOT CONTROLLED WITH YOUR NAUSEA MEDICATION *UNUSUAL SHORTNESS OF BREATH *UNUSUAL BRUISING OR BLEEDING *URINARY PROBLEMS (pain or burning when urinating, or frequent urination) *BOWEL PROBLEMS (unusual diarrhea, constipation, pain near the anus) TENDERNESS IN MOUTH AND THROAT WITH OR WITHOUT PRESENCE OF ULCERS (sore throat, sores in mouth, or a toothache) UNUSUAL RASH, SWELLING OR PAIN  UNUSUAL VAGINAL DISCHARGE OR ITCHING   Items with * indicate a potential emergency and should be followed up as soon as possible or go to the Emergency Department if any problems should occur.  Please show the CHEMOTHERAPY ALERT CARD or IMMUNOTHERAPY ALERT CARD  at check-in to the Emergency Department and triage nurse.  Should you have questions after your visit or need to cancel or reschedule your appointment, please contact [facility]  [phone] and follow the prompts.  Office hours are 8:00 a.m. to 4:30 p.m. Monday - Friday. Please note that voicemails left after 4:00 p.m. may not be returned until the following business day.  We are closed weekends and major holidays. You have access to a nurse at all times for urgent questions. Please call the main number to the clinic [phone] and follow the prompts.  For any non-urgent questions, you may also contact your provider using MyChart. We now offer e-Visits for anyone 37 and older to request care online for non-urgent symptoms. For details visit [email or web address].   Also download the MyChart app! Go to the app store, search "MyChart", open the app, select [facility], and log in with your MyChart username and password.

## 2022-03-27 NOTE — Progress Notes (Signed)
Nutrition Follow-up:  Patient with lung cancer.  Patient currently on cisplatin/taxotere.    Met with patient in infusion.  Patient reports that appetite is better.  Thrush and sore throat have resolved.  Patient still reports nausea especially with smells. Nausea medications help symptoms.  Reports that she ate pizza last night for supper.  Has never been a big breakfast or lunch eater.  Snacks on nuts and peanut butter crackers during the day.  Drinks water and gatoraide mostly.  Has been drinking protein shake daily    Medications: reviewed  Labs: Na 134, glucose 118  Anthropometrics:   Weight 180 lb 6.4 oz today  175 lb on 1/10 UBW of 166-172 lb   NUTRITION DIAGNOSIS: Inadequate oral intake improving    INTERVENTION:  Patient did receive handouts in the mail that RD sent previously. Discussed snack options that include protein to include daily Continue oral nutrition supplement daily. If not oral intake decreased can increase to 2-3 times per day Continue nausea medication    MONITORING, EVALUATION, GOAL: weight trends, intake   NEXT VISIT: Thursday, Feb 15 during infusion  Sophonie Goforth B. Zenia Resides, [place], [place] Registered Dietitian [phone]

## 2022-03-27 NOTE — Progress Notes (Signed)
Per Dr. Rogue Bussing okay to run 1 hour post cisplatin hydration fluids with the Cisplatin.

## 2022-03-28 ENCOUNTER — Inpatient Hospital Stay: Payer: Medicare Other

## 2022-03-28 VITALS — BP 154/64 | HR 76 | Temp 99.1°F | Resp 16

## 2022-03-28 DIAGNOSIS — Z5189 Encounter for other specified aftercare: Secondary | ICD-10-CM | POA: Diagnosis not present

## 2022-03-28 DIAGNOSIS — Z902 Acquired absence of lung [part of]: Secondary | ICD-10-CM | POA: Diagnosis not present

## 2022-03-28 DIAGNOSIS — F17210 Nicotine dependence, cigarettes, uncomplicated: Secondary | ICD-10-CM | POA: Diagnosis not present

## 2022-03-28 DIAGNOSIS — R112 Nausea with vomiting, unspecified: Secondary | ICD-10-CM | POA: Diagnosis not present

## 2022-03-28 DIAGNOSIS — C3412 Malignant neoplasm of upper lobe, left bronchus or lung: Secondary | ICD-10-CM | POA: Diagnosis not present

## 2022-03-28 DIAGNOSIS — Z5111 Encounter for antineoplastic chemotherapy: Secondary | ICD-10-CM | POA: Diagnosis not present

## 2022-03-28 MED ORDER — PEGFILGRASTIM-CBQV 6 MG/0.6ML ~~LOC~~ SOSY
6.0000 mg | PREFILLED_SYRINGE | Freq: Once | SUBCUTANEOUS | Status: AC
  Administered 2022-03-28: 6 mg via SUBCUTANEOUS
  Filled 2022-03-28: qty 0.6

## 2022-03-28 MED ORDER — HEPARIN SOD (PORK) LOCK FLUSH 100 UNIT/ML IV SOLN
500.0000 [IU] | Freq: Once | INTRAVENOUS | Status: AC | PRN
  Administered 2022-03-28: 500 [IU]
  Filled 2022-03-28: qty 5

## 2022-03-28 MED ORDER — SODIUM CHLORIDE 0.9% FLUSH
10.0000 mL | Freq: Once | INTRAVENOUS | Status: DC | PRN
  Filled 2022-03-28: qty 10

## 2022-03-28 MED ORDER — SODIUM CHLORIDE 0.9 % IV SOLN
Freq: Once | INTRAVENOUS | Status: AC
  Filled 2022-03-28: qty 250

## 2022-03-28 NOTE — Patient Instructions (Signed)

## 2022-03-31 ENCOUNTER — Inpatient Hospital Stay: Payer: Medicare Other

## 2022-03-31 ENCOUNTER — Inpatient Hospital Stay (HOSPITAL_BASED_OUTPATIENT_CLINIC_OR_DEPARTMENT_OTHER): Payer: Medicare Other | Admitting: Nurse Practitioner

## 2022-03-31 ENCOUNTER — Encounter: Payer: Self-pay | Admitting: Nurse Practitioner

## 2022-03-31 ENCOUNTER — Telehealth: Payer: Self-pay | Admitting: *Deleted

## 2022-03-31 VITALS — BP 135/71 | HR 94 | Temp 99.0°F | Wt 168.0 lb

## 2022-03-31 DIAGNOSIS — T451X5A Adverse effect of antineoplastic and immunosuppressive drugs, initial encounter: Secondary | ICD-10-CM

## 2022-03-31 DIAGNOSIS — C3412 Malignant neoplasm of upper lobe, left bronchus or lung: Secondary | ICD-10-CM

## 2022-03-31 DIAGNOSIS — E860 Dehydration: Secondary | ICD-10-CM

## 2022-03-31 DIAGNOSIS — R112 Nausea with vomiting, unspecified: Secondary | ICD-10-CM

## 2022-03-31 DIAGNOSIS — F17210 Nicotine dependence, cigarettes, uncomplicated: Secondary | ICD-10-CM | POA: Diagnosis not present

## 2022-03-31 DIAGNOSIS — Z5189 Encounter for other specified aftercare: Secondary | ICD-10-CM | POA: Diagnosis not present

## 2022-03-31 DIAGNOSIS — Z5111 Encounter for antineoplastic chemotherapy: Secondary | ICD-10-CM | POA: Diagnosis not present

## 2022-03-31 DIAGNOSIS — Z95828 Presence of other vascular implants and grafts: Secondary | ICD-10-CM

## 2022-03-31 DIAGNOSIS — Z902 Acquired absence of lung [part of]: Secondary | ICD-10-CM | POA: Diagnosis not present

## 2022-03-31 LAB — CBC WITH DIFFERENTIAL/PLATELET
Abs Immature Granulocytes: 0.78 10*3/uL — ABNORMAL HIGH (ref 0.00–0.07)
Band Neutrophils: 0 %
Basophils Absolute: 0 10*3/uL (ref 0.0–0.1)
Basophils Relative: 0 %
Blasts: 0 %
Eosinophils Absolute: 0 10*3/uL (ref 0.0–0.5)
Eosinophils Relative: 0 %
HCT: 35 % — ABNORMAL LOW (ref 36.0–46.0)
Hemoglobin: 12 g/dL (ref 12.0–15.0)
Lymphocytes Relative: 8 %
Lymphs Abs: 1.6 10*3/uL (ref 0.7–4.0)
MCH: 30.2 pg (ref 26.0–34.0)
MCHC: 34.3 g/dL (ref 30.0–36.0)
MCV: 88.2 fL (ref 80.0–100.0)
Metamyelocytes Relative: 1 %
Monocytes Absolute: 0.2 10*3/uL (ref 0.1–1.0)
Monocytes Relative: 1 %
Myelocytes: 3 %
Neutro Abs: 17 10*3/uL — ABNORMAL HIGH (ref 1.7–7.7)
Neutrophils Relative %: 87 %
Other: 0 %
Platelets: 127 10*3/uL — ABNORMAL LOW (ref 150–400)
Promyelocytes Relative: 0 %
RBC: 3.97 MIL/uL (ref 3.87–5.11)
RDW: 17.3 % — ABNORMAL HIGH (ref 11.5–15.5)
Smear Review: DECREASED
WBC Morphology: INCREASED
WBC: 19.6 10*3/uL — ABNORMAL HIGH (ref 4.0–10.5)
nRBC: 0 % (ref 0.0–0.2)
nRBC: 0 /100 WBC

## 2022-03-31 LAB — COMPREHENSIVE METABOLIC PANEL
ALT: 19 U/L (ref 0–44)
AST: 23 U/L (ref 15–41)
Albumin: 3.7 g/dL (ref 3.5–5.0)
Alkaline Phosphatase: 128 U/L — ABNORMAL HIGH (ref 38–126)
Anion gap: 10 (ref 5–15)
BUN: 21 mg/dL (ref 8–23)
CO2: 29 mmol/L (ref 22–32)
Calcium: 9.1 mg/dL (ref 8.9–10.3)
Chloride: 94 mmol/L — ABNORMAL LOW (ref 98–111)
Creatinine, Ser: 0.76 mg/dL (ref 0.44–1.00)
GFR, Estimated: >60 mL/min (ref >60)
Glucose, Bld: 129 mg/dL — ABNORMAL HIGH (ref 70–99)
Potassium: 3.1 mmol/L — ABNORMAL LOW (ref 3.5–5.1)
Sodium: 133 mmol/L — ABNORMAL LOW (ref 135–145)
Total Bilirubin: 1.1 mg/dL (ref 0.3–1.2)
Total Protein: 6.9 g/dL (ref 6.5–8.1)

## 2022-03-31 LAB — MAGNESIUM: Magnesium: 2 mg/dL (ref 1.7–2.4)

## 2022-03-31 MED ORDER — SODIUM CHLORIDE 0.9% FLUSH
10.0000 mL | Freq: Once | INTRAVENOUS | Status: AC
  Administered 2022-03-31: 10 mL via INTRAVENOUS
  Filled 2022-03-31: qty 10

## 2022-03-31 MED ORDER — SODIUM CHLORIDE 0.9 % IV SOLN
10.0000 mg | Freq: Once | INTRAVENOUS | Status: AC
  Administered 2022-03-31: 10 mg via INTRAVENOUS
  Filled 2022-03-31: qty 10

## 2022-03-31 MED ORDER — ONDANSETRON HCL 4 MG/2ML IJ SOLN
8.0000 mg | Freq: Once | INTRAMUSCULAR | Status: AC
  Administered 2022-03-31: 8 mg via INTRAVENOUS
  Filled 2022-03-31: qty 4

## 2022-03-31 MED ORDER — SODIUM CHLORIDE 0.9 % IV SOLN
Freq: Once | INTRAVENOUS | Status: AC
  Filled 2022-03-31: qty 250

## 2022-03-31 MED ORDER — SODIUM CHLORIDE 0.9 % IV SOLN: 8.0000 mg | Freq: Once | INTRAVENOUS | Status: DC

## 2022-03-31 MED ORDER — SODIUM CHLORIDE 0.9% FLUSH
10.0000 mL | Freq: Once | INTRAVENOUS | Status: AC
  Filled 2022-03-31: qty 10

## 2022-03-31 MED ORDER — HEPARIN SOD (PORK) LOCK FLUSH 100 UNIT/ML IV SOLN
500.0000 [IU] | Freq: Once | INTRAVENOUS | Status: AC
  Administered 2022-03-31: 500 [IU] via INTRAVENOUS
  Filled 2022-03-31: qty 5

## 2022-03-31 NOTE — Telephone Encounter (Signed)
Per Maudie Mercury, RN in radiation, patient has been vomiting since Saturday. I reached out to her daughter via telephone call this morning to further discuss concerns.  Pt had her chemotherapy treatment on Thursday and had fluids on Friday. On Saturday afternoon, pt started vomiting. Pt has unable to keep anything down. She has not taken any antiemetics this morning. She denies any diarrhea. She stated that her ribs hurt where she has been vomiting.

## 2022-03-31 NOTE — Progress Notes (Unsigned)
Symptom Management [place] [facility] [place]. [facility] [address], [place], [place] [address] [phone] (phone) [phone] (fax)  Patient Care Team: Tower, [name], MD as PCP - General Telford Nab, RN as Oncology Nurse Navigator Cammie Sickle, MD as Consulting Physician (Internal Medicine)   Name of the patient: [name] [name]  [id number]  1950-07-28   Date of visit: 03/31/22  Diagnosis- Lung Cancer  Chief complaint/ Reason for visit- Vomiting, Nausea  Heme/Onc history:  Oncology History Overview Note  JULY 2023- 1. 10 mm mean diameter nodule in the left upper lobe adjacent to an area of pleural and parenchymal scarring.     SEP 2023- 1. Hypermetabolic spiculated nodular left upper lobe pulmonary lesion is compatible with primary bronchogenic neoplasm. 2. Mildly metabolic nodularity in the left hilum, without discrete adenopathy may reflect early nodal disease involvement. Suggest attention on follow-up imaging. 3. No evidence of distant hypermetabolic metastatic disease. 4. Hypermetabolic nodule in the left lobe of the thyroid, Recommend thyroid US and biopsy (ref: J Am Coll Radiol. 2015 Feb;12(2): 143-50). 5. Aortic Atherosclerosis (ICD10-I70.0) and Emphysema (ICD10-J43.9).  # LUNG: Resection  Synchronous Tumors: Not applicable Total Number of Primary Tumors: 1 Procedure: Lobectomy, lung Specimen Laterality: Left Tumor Focality: Unifocal Tumor Site: Upper lobe Tumor Size: 2.6 cm Histologic Type: Squamous cell carcinoma, moderately differentiated Visceral Pleura Invasion: Not identified Direct Invasion of Adjacent Structures: No adjacent structures present Lymphovascular Invasion: Not identified Margins: All margins negative for invasive carcinoma      Closest Margin(s) to Invasive Carcinoma: Bronchovascular margin      Margin(s) Involved by Invasive  Carcinoma: Not applicable       Margin Status for Non-Invasive Tumor: Negative Treatment Effect: No known presurgical therapy Regional Lymph Nodes:      Number of Lymph Nodes Involved: 1                           Nodal Sites with Tumor: Level 10      Number of Lymph Nodes Examined: 21                      Nodal Sites Examined: Levels 4, 5, 7, 9, 10, 11, 12 and 13 Distant Metastasis:      Distant Site(s) Involved: Not applicable Pathologic Stage Classification (pTNM, AJCC 8th Edition): pT1c, pN1 Ancillary Studies: Can be performed upon request Representative Tumor Block: B5 Comment(s): None  # DEC 14th, 2023- Cisplatin-Taxotere #1; D-2 udenyca x4 cycles    Lung cancer ([place])  12/05/2021 Initial Diagnosis   Lung cancer ([place])   12/18/2021 Cancer Staging   Staging form: Lung, AJCC 8th Edition - Pathologic stage from 12/18/2021: Stage IIB (pT1c, pN1, cM0) - Signed by [name], MD on 12/18/2021 Histopathologic type: Squamous cell carcinoma, NOS   Cancer of upper lobe of left lung ([place])  01/03/2022 Initial Diagnosis   Cancer of upper lobe of left lung ([place])   01/03/2022 Cancer Staging   Staging form: Lung, AJCC 8th Edition - Pathologic: Stage IIB (pT1c, pN1, cM0) - Signed by Cammie Sickle, MD on 01/03/2022   02/13/2022 -  Chemotherapy   Patient is on Treatment Plan : LUNG Cisplatin + Docetaxel q21d       Interval history- Patient is 72 year old female currently receiving cisplatin + docetaxel chemotherapy who presents to clinic for complaints of nausea,  vomiting. Symptoms started over the weekend and have persisted. Complains of generalized rib pain d/t vomiting. No fevers or chills. No diarrhea or abdominal pain. Not able to tolerate food or liquids without recurrent symptoms.   ECOG FS:2 - Symptomatic, <50% confined to bed  Review of systems- Review of Systems  Constitutional:  Positive for malaise/fatigue. Negative for chills, fever and weight loss.  HENT:   Negative for hearing loss, nosebleeds, sore throat and tinnitus.   Eyes:  Negative for blurred vision and double vision.  Respiratory:  Negative for cough, hemoptysis, shortness of breath and wheezing.   Cardiovascular:  Negative for chest pain, palpitations and leg swelling.  Gastrointestinal:  Positive for nausea and vomiting. Negative for abdominal pain, blood in stool, constipation, diarrhea and melena.  Genitourinary:  Negative for dysuria and urgency.  Musculoskeletal:  Negative for back pain, falls, joint pain and myalgias.  Skin:  Negative for itching and rash.  Neurological:  Negative for dizziness, tingling, sensory change, loss of consciousness, weakness and headaches.  Endo/Heme/Allergies:  Negative for environmental allergies. Does not bruise/bleed easily.  Psychiatric/Behavioral:  Negative for depression. The patient is not nervous/anxious and does not have insomnia.     No Known Allergies  Past Medical History:  Diagnosis Date   Arthritis    OA   Carpal tunnel syndrome    Depression    Endometriosis    Fibromyalgia    Hyperlipidemia    Left upper lobe pulmonary nodule 11/2021   Post herpetic neuralgia    Sleep disorder    Tobacco abuse     Past Surgical History:  Procedure Laterality Date   ABDOMINAL HYSTERECTOMY  1992   total endometriosis   APPENDECTOMY  1992   bladder tack  1992   CATARACT EXTRACTION W/PHACO Right 03/25/2018   Procedure: CATARACT EXTRACTION PHACO AND INTRAOCULAR LENS PLACEMENT ([place]) RIGHT;  Surgeon: Marchia Meiers, MD;  Location: [facility];  Service: Ophthalmology;  Laterality: Right;  [place]  01:11 CDE 8.02 Fluid pack lot # [id number]   CATARACT EXTRACTION W/PHACO Left 04/22/2018   Procedure: CATARACT EXTRACTION PHACO AND INTRAOCULAR LENS PLACEMENT ([place]) Left Eye;  Surgeon: Marchia Meiers, MD;  Location: [facility];  Service: Ophthalmology;  Laterality: Left;  [place]  01:02 CDE 8.11 Fluid pack lot # [id number]   COLONOSCOPY     FOOT SURGERY Right 04/2016    [place] in [place] great toe; straightening others   INTERCOSTAL NERVE BLOCK Left 12/12/2021   Procedure: INTERCOSTAL NERVE BLOCK;  Surgeon: [name], MD;  Location: [facility];  Service: Thoracic;  Laterality: Left;   IR IMAGING GUIDED PORT INSERTION  02/27/2022   LYMPH NODE DISSECTION Left 12/12/2021   Procedure: LYMPH NODE DISSECTION;  Surgeon: [name], MD;  Location: [facility];  Service: Thoracic;  Laterality: Left;   VIDEO BRONCHOSCOPY N/A 12/16/2021   Procedure: VIDEO BRONCHOSCOPY;  Surgeon: [name], MD;  Location: [facility];  Service: Thoracic;  Laterality: N/A;    Social History   Socioeconomic History   Marital status: Widowed    Spouse name: Not on file   Number of children: 2   Years of education: Not on file   Highest education level: Not on file  Occupational History   Not on file  Tobacco Use   Smoking status: Former    Packs/day: 1.00    Years: 55.00    Total pack years: 55.00    Types: Cigarettes    Quit date: 12/03/2021    Years  since quitting: 0.3   Smokeless tobacco: Never  Vaping Use   Vaping Use: Some days  Substance and Sexual Activity   Alcohol use: No    Alcohol/week: 0.0 standard drinks of alcohol   Drug use: No   Sexual activity: Not Currently  Other Topics Concern   Not on file  Social History Narrative   Lives alone   Social Determinants of Health   Financial Resource Strain: Low Risk  (02/01/2021)   Overall Financial Resource Strain (CARDIA)    Difficulty of Paying Living Expenses: Not hard at all  Food Insecurity: No Food Insecurity (02/01/2021)   Hunger Vital Sign    Worried About Running Out of Food in the Last Year: Never true    Ran Out of Food in the Last Year: Never true  Transportation Needs: No Transportation Needs (02/18/2022)   PRAPARE - Hydrologist (Medical): No    Lack of Transportation (Non-Medical): No  Physical Activity: Inactive (02/01/2021)    Exercise Vital Sign    Days of Exercise per Week: 0 days    Minutes of Exercise per Session: 0 min  Stress: No Stress Concern Present (02/01/2021)   [place]    Feeling of Stress : Not at all  Social Connections: [place] (02/01/2021)   Social Connection and Isolation Panel [NHANES]    Frequency of Communication with Friends and Family: More than three times a week    Frequency of Social Gatherings with Friends and Family: More than three times a week    Attends Religious Services: More than 4 times per year    Active Member of [employer] or Organizations: Yes    Attends Music therapist: More than 4 times per year    Marital Status: Married  Human resources officer Violence: Not At Risk (02/01/2021)   Humiliation, Afraid, Rape, and Kick questionnaire    Fear of Current or Ex-Partner: No    Emotionally Abused: No    Physically Abused: No    Sexually Abused: No    Family History  Problem Relation Age of Onset   Heart disease Mother        MI   Heart disease Father        MI   Arthritis Sister        crippling arthritis   Arthritis Brother        crippling arthritis     Current Outpatient Medications:    acetaminophen (TYLENOL) 500 MG tablet, Take 500 mg by mouth every 6 (six) hours as needed., Disp: , Rfl:    aspirin 81 MG tablet, Take 81 mg by mouth daily., Disp: , Rfl:    busPIRone (BUSPAR) 15 MG tablet, TAKE 1/2 TABLET BY MOUTH TWICE A DAY, Disp: 90 tablet, Rfl: 1   Cobalamin Combinations (NEURIVA PLUS PO), Take by mouth., Disp: , Rfl:    dexamethasone (DECADRON) 4 MG tablet, Take 1 tablet (4 mg total) by mouth 2 (two) times daily with a meal. Start 2 days prior to chemo. Take for 2 days; DO NOT take on the day of chemo., Disp: 60 tablet, Rfl: 0   docusate sodium (COLACE) 100 MG capsule, Take 200 mg by mouth daily., Disp: , Rfl:    DULoxetine (CYMBALTA) 60 MG capsule, TAKE 1 CAPSULE BY MOUTH EVERY  DAY, Disp: 90 capsule, Rfl: 1   Fluticasone-Umeclidin-Vilant (TRELEGY ELLIPTA) 100-62.5-25 MCG/ACT AEPB, Inhale 1 puff into the lungs daily., Disp: 28  each, Rfl: 11   lidocaine-prilocaine (EMLA) cream, Apply on the port. 30 -45 min  prior to port access., Disp: 30 g, Rfl: 3   MAGNESIUM CITRATE PO, Take by mouth daily., Disp: , Rfl:    Melatonin 10 MG CAPS, Take 20 mg by mouth at bedtime., Disp: , Rfl:    nystatin (MYCOSTATIN) 100000 UNIT/ML suspension, Take 5 mLs (500,000 Units total) by mouth 4 (four) times daily., Disp: 60 mL, Rfl: 0   ondansetron (ZOFRAN) 8 MG tablet, One pill every 8 hours as needed for nausea/vomitting., Disp: 40 tablet, Rfl: 1   pregabalin (LYRICA) 75 MG capsule, TAKE 1 CAPSULE BY MOUTH TWICE A DAY, Disp: 180 capsule, Rfl: 1   prochlorperazine (COMPAZINE) 10 MG tablet, Take 1 tablet (10 mg total) by mouth every 6 (six) hours as needed for nausea or vomiting., Disp: 40 tablet, Rfl: 1   senna (SENOKOT) 8.6 MG tablet, Take 1 tablet (8.6 mg total) by mouth 2 (two) times daily as needed for constipation., Disp: 14 tablet, Rfl: 0   traMADol (ULTRAM) 50 MG tablet, TAKE 1 TABLET BY MOUTH EVERY 8 HOURS AS NEEDED., Disp: 90 tablet, Rfl: 0   magic mouthwash (nystatin, lidocaine, diphenhydrAMINE, alum & mag hydroxide) suspension, Swish and spit 5 mLs 4 (four) times daily as needed for mouth pain. (Patient not taking: Reported on 03/26/2022), Disp: 240 mL, Rfl: 1   naproxen sodium (ALEVE) 220 MG tablet, Take 440 mg by mouth daily as needed (headaches). (Patient not taking: Reported on 03/26/2022), Disp: , Rfl:  No current facility-administered medications for this visit.  Facility-Administered Medications Ordered in Other Visits:    0.9 %  sodium chloride infusion, , Intravenous, Continuous, [place], Sarah M, PA-C, Stopped at 02/17/22 1214   heparin lock flush 100 unit/mL, 500 Units, Intravenous, Once, Verlon Au, NP   sodium chloride flush (NS) 0.9 % injection 10 mL, 10 mL,  Intravenous, Once, Verlon Au, NP  Physical exam:  Vitals:   03/31/22 0951  BP: 135/71  Pulse: 94  Temp: 99 F (37.2 C)  TempSrc: Tympanic  SpO2: 97%  Weight: 168 lb (76.2 kg)   Physical Exam Constitutional:      General: She is not in acute distress.    Appearance: She is well-developed. She is not ill-appearing.     Comments: Fatigued appearing. In recliner.   Cardiovascular:     Rate and Rhythm: Normal rate and regular rhythm.  Pulmonary:     Effort: Pulmonary effort is normal.     Breath sounds: Normal breath sounds.  Abdominal:     General: Bowel sounds are normal. There is no distension.     Palpations: Abdomen is soft.     Tenderness: There is no abdominal tenderness.  Musculoskeletal:        General: No tenderness or deformity.  Skin:    General: Skin is warm and dry.     Coloration: Skin is not pale.  Neurological:     General: No focal deficit present.     Mental Status: She is alert and oriented to person, place, and time.  Psychiatric:        Mood and Affect: Mood normal.        Behavior: Behavior normal.         Latest Ref Rng & Units 03/26/2022    2:52 PM  CMP  Glucose 70 - 99 mg/dL 118   BUN 8 - 23 mg/dL 17   Creatinine 0.44 - 1.00 mg/dL 0.64  Sodium 135 - 145 mmol/L 134   Potassium 3.5 - 5.1 mmol/L 5.1   Chloride 98 - 111 mmol/L 99   CO2 22 - 32 mmol/L 25   Calcium 8.9 - 10.3 mg/dL 9.7   Total Protein 6.5 - 8.1 g/dL 7.2   Total Bilirubin 0.3 - 1.2 mg/dL 0.6   Alkaline Phos 38 - 126 U/L 103   AST 15 - 41 U/L 17   ALT 0 - 44 U/L 13       Latest Ref Rng & Units 03/31/2022    9:51 AM  CBC  WBC 4.0 - 10.5 K/uL 19.6   Hemoglobin 12.0 - 15.0 g/dL 12.0   Hematocrit 36.0 - 46.0 % 35.0   Platelets 150 - 400 K/uL 127    No images are attached to the encounter.  DG Chest 2 View  Result Date: 03/25/2022 CLINICAL DATA:  Lung nodule. History of lymph node dissection 12/13/2019. Some shortness of breath. History of left upper lobectomy or  primary lung cancer. EXAM: CHEST - 2 VIEW COMPARISON:  Chest two views 03/16/2021, 12/24/2021, 12/21/2021; CT chest 12/15/2021 FINDINGS: Right chest wall porta catheter tip overlies the central superior vena cava, new from 01/14/2022. This was placed 02/27/2022, seen on intra procedural fluoroscopy images on PACS. Mild calcification within the aortic arch. The visualized cardiac silhouette is unremarkable. Slight improvement in mild-to-moderate left pleural effusion. The right lung is clear. No pneumothorax is seen. Mild-to-moderate multilevel degenerative disc changes of the thoracic spine. IMPRESSION: Right chest wall porta catheter tip overlies the central superior vena cava, new from 01/14/2022. Slight improvement in mild-to-moderate left pleural effusion. Electronically Signed   By: Yvonne Kendall M.D.   On: 03/25/2022 11:34    Assessment and plan- Patient is a 72 y.o. female   Lung Cancer- stage IIB, receiving cisplatin + docetaxel s/p cycle 3 of 4 on 03/27/22.  Vomiting- d/t chemotherapy. Plan for iv fluids, decadron 10 mg, and zofran 8 mg in clinic. Will see if she is able to tolerate fluids by mouth and small bites. Will plan for reassessment tomorrow and possible IV medications as well. Reviewed use of home antiemetics. May need to take scheduled zofran for few more days then compazine for refractory symptoms. If unable to tolerate liquids she'll need to rtc for iv fluids.  Diarrhea- now resolved.  Rib pain- d/t emesis.   Disposition: IV fluids, zofran 8 mg, decadron 10 mg today RTC as scheduled or in the interim as needed if symptoms don't improve or worsen in interim- la   Visit Diagnosis No diagnosis found.  Patient expressed understanding and was in agreement with this plan. She also understands that She can call clinic at any time with any questions, concerns, or complaints.   Thank you for allowing me to participate in the care of this very pleasant patient.   Beckey Rutter, DNP,  AGNP-C, [place] [facility]  CC:

## 2022-04-01 ENCOUNTER — Telehealth: Payer: Self-pay

## 2022-04-01 NOTE — Telephone Encounter (Signed)
Called to follow up with patient this morning. Patient was seen on [facility] on 03/31/22 for vomiting x 2 days. Per patient states she has not vomited any since but is still nauseous and has a headache. Asked patient if she felt she needed to be seen today for more fluids but patient declined. Per patient she is scheduled for 04/02/22 and will wait for that appt. Advised to call back if symptoms progress or she has any concerns. Patient verbalized understanding.

## 2022-04-02 ENCOUNTER — Inpatient Hospital Stay: Payer: Medicare Other

## 2022-04-02 ENCOUNTER — Inpatient Hospital Stay (HOSPITAL_BASED_OUTPATIENT_CLINIC_OR_DEPARTMENT_OTHER): Payer: Medicare Other | Admitting: Medical Oncology

## 2022-04-02 ENCOUNTER — Encounter: Payer: Self-pay | Admitting: Medical Oncology

## 2022-04-02 VITALS — BP 123/73 | HR 85 | Temp 98.4°F | Wt 170.0 lb

## 2022-04-02 DIAGNOSIS — F17210 Nicotine dependence, cigarettes, uncomplicated: Secondary | ICD-10-CM | POA: Diagnosis not present

## 2022-04-02 DIAGNOSIS — R112 Nausea with vomiting, unspecified: Secondary | ICD-10-CM

## 2022-04-02 DIAGNOSIS — Z87891 Personal history of nicotine dependence: Secondary | ICD-10-CM

## 2022-04-02 DIAGNOSIS — T451X5D Adverse effect of antineoplastic and immunosuppressive drugs, subsequent encounter: Secondary | ICD-10-CM

## 2022-04-02 DIAGNOSIS — Z5189 Encounter for other specified aftercare: Secondary | ICD-10-CM | POA: Diagnosis not present

## 2022-04-02 DIAGNOSIS — T451X5A Adverse effect of antineoplastic and immunosuppressive drugs, initial encounter: Secondary | ICD-10-CM

## 2022-04-02 DIAGNOSIS — Z5111 Encounter for antineoplastic chemotherapy: Secondary | ICD-10-CM | POA: Diagnosis not present

## 2022-04-02 DIAGNOSIS — C3412 Malignant neoplasm of upper lobe, left bronchus or lung: Secondary | ICD-10-CM

## 2022-04-02 DIAGNOSIS — E860 Dehydration: Secondary | ICD-10-CM | POA: Diagnosis not present

## 2022-04-02 DIAGNOSIS — Z902 Acquired absence of lung [part of]: Secondary | ICD-10-CM | POA: Diagnosis not present

## 2022-04-02 LAB — BASIC METABOLIC PANEL
Anion gap: 10 (ref 5–15)
BUN: 17 mg/dL (ref 8–23)
CO2: 24 mmol/L (ref 22–32)
Calcium: 9 mg/dL (ref 8.9–10.3)
Chloride: 101 mmol/L (ref 98–111)
Creatinine, Ser: 0.68 mg/dL (ref 0.44–1.00)
GFR, Estimated: >60 mL/min (ref >60)
Glucose, Bld: 109 mg/dL — ABNORMAL HIGH (ref 70–99)
Potassium: 3.5 mmol/L (ref 3.5–5.1)
Sodium: 135 mmol/L (ref 135–145)

## 2022-04-02 LAB — CBC WITH DIFFERENTIAL/PLATELET
Abs Immature Granulocytes: 0.06 10*3/uL (ref 0.00–0.07)
Basophils Absolute: 0 10*3/uL (ref 0.0–0.1)
Basophils Relative: 0 %
Eosinophils Absolute: 0.1 10*3/uL (ref 0.0–0.5)
Eosinophils Relative: 2 %
HCT: 32 % — ABNORMAL LOW (ref 36.0–46.0)
Hemoglobin: 10.9 g/dL — ABNORMAL LOW (ref 12.0–15.0)
Immature Granulocytes: 1 %
Lymphocytes Relative: 20 %
Lymphs Abs: 1 10*3/uL (ref 0.7–4.0)
MCH: 30.2 pg (ref 26.0–34.0)
MCHC: 34.1 g/dL (ref 30.0–36.0)
MCV: 88.6 fL (ref 80.0–100.0)
Monocytes Absolute: 0.5 10*3/uL (ref 0.1–1.0)
Monocytes Relative: 9 %
Neutro Abs: 3.6 10*3/uL (ref 1.7–7.7)
Neutrophils Relative %: 68 %
Platelets: 97 10*3/uL — ABNORMAL LOW (ref 150–400)
RBC: 3.61 MIL/uL — ABNORMAL LOW (ref 3.87–5.11)
RDW: 16.9 % — ABNORMAL HIGH (ref 11.5–15.5)
Smear Review: NORMAL
WBC: 5.2 10*3/uL (ref 4.0–10.5)
nRBC: 0 % (ref 0.0–0.2)

## 2022-04-02 LAB — MAGNESIUM: Magnesium: 1.9 mg/dL (ref 1.7–2.4)

## 2022-04-02 MED ORDER — SODIUM CHLORIDE 0.9 % IV SOLN
Freq: Once | INTRAVENOUS | Status: AC
  Filled 2022-04-02: qty 250

## 2022-04-02 MED ORDER — HEPARIN SOD (PORK) LOCK FLUSH 100 UNIT/ML IV SOLN
500.0000 [IU] | Freq: Once | INTRAVENOUS | Status: AC
  Administered 2022-04-02: 500 [IU] via INTRAVENOUS
  Filled 2022-04-02: qty 5

## 2022-04-02 MED ORDER — SODIUM CHLORIDE 0.9% FLUSH
10.0000 mL | Freq: Once | INTRAVENOUS | Status: AC
  Administered 2022-04-02: 10 mL via INTRAVENOUS
  Filled 2022-04-02: qty 10

## 2022-04-02 NOTE — Progress Notes (Signed)
[facility] [place]. [facility] [address], [place], [place] [address] [phone] (phone) [phone] (fax)  Patient Care Team: Tower, [name], MD as PCP - General Telford Nab, RN as Oncology Nurse Navigator Cammie Sickle, MD as Consulting Physician (Internal Medicine)   Name of the patient: [name] [name]  [id number]  [birth year]   Date of visit: 04/02/22  Diagnosis- Lung Cancer  Chief complaint/ Reason for visit- Vomiting, Nausea  Heme/Onc history:  Oncology History Overview Note  JULY 2023- 1. 10 mm mean diameter nodule in the left upper lobe adjacent to an area of pleural and parenchymal scarring.     SEP 2023- 1. Hypermetabolic spiculated nodular left upper lobe pulmonary lesion is compatible with primary bronchogenic neoplasm. 2. Mildly metabolic nodularity in the left hilum, without discrete adenopathy may reflect early nodal disease involvement. Suggest attention on follow-up imaging. 3. No evidence of distant hypermetabolic metastatic disease. 4. Hypermetabolic nodule in the left lobe of the thyroid, Recommend thyroid US and biopsy (ref: J Am Coll Radiol. 2015 Feb;12(2): 143-50). 5. Aortic Atherosclerosis (ICD10-I70.0) and Emphysema (ICD10-J43.9).  # LUNG: Resection  Synchronous Tumors: Not applicable Total Number of Primary Tumors: 1 Procedure: Lobectomy, lung Specimen Laterality: Left Tumor Focality: Unifocal Tumor Site: Upper lobe Tumor Size: 2.6 cm Histologic Type: Squamous cell carcinoma, moderately differentiated Visceral Pleura Invasion: Not identified Direct Invasion of Adjacent Structures: No adjacent structures present Lymphovascular Invasion: Not identified Margins: All margins negative for invasive carcinoma      Closest Margin(s) to Invasive Carcinoma: Bronchovascular margin      Margin(s) Involved by Invasive  Carcinoma: Not applicable       Margin Status for Non-Invasive Tumor: Negative Treatment Effect: No known presurgical therapy Regional Lymph Nodes:      Number of Lymph Nodes Involved: 1                           Nodal Sites with Tumor: Level 10      Number of Lymph Nodes Examined: 21                      Nodal Sites Examined: Levels 4, 5, 7, 9, 10, 11, 12 and 13 Distant Metastasis:      Distant Site(s) Involved: Not applicable Pathologic Stage Classification (pTNM, AJCC 8th Edition): pT1c, pN1 Ancillary Studies: Can be performed upon request Representative Tumor Block: B5 Comment(s): None  # DEC 14th, 2023- Cisplatin-Taxotere #1; D-2 udenyca x4 cycles    Lung cancer ([place])  12/05/2021 Initial Diagnosis   Lung cancer ([place])   12/18/2021 Cancer Staging   Staging form: Lung, AJCC 8th Edition - Pathologic stage from 12/18/2021: Stage IIB (pT1c, pN1, cM0) - Signed by [name], MD on 12/18/2021 Histopathologic type: Squamous cell carcinoma, NOS   Cancer of upper lobe of left lung ([place])  01/03/2022 Initial Diagnosis   Cancer of upper lobe of left lung ([place])   01/03/2022 Cancer Staging   Staging form: Lung, AJCC 8th Edition - Pathologic: Stage IIB (pT1c, pN1, cM0) - Signed by Cammie Sickle, MD on 01/03/2022   02/13/2022 -  Chemotherapy   Patient is on Treatment Plan : LUNG Cisplatin + Docetaxel q21d       Interval history-   Patient was seen yesterday in our [facility] for nausea/vomiting. She was supported with IVF, antiemetics, and decadron. She  is here for follow up, labs and for consideration of additional IVF. Today she reports that overall she is feeling much better. Still a bit fatigued but nausea has greatly improved and she has not had any episodes of vomiting since. No watery diarrhea. No fevers, cough, dysuria.   Denies any neurologic complaints. Denies recent fevers or illnesses. Denies any easy bleeding or bruising. Reports good appetite and denies weight  loss. Denies chest pain. Denies any nausea, vomiting, constipation, or diarrhea. Denies urinary complaints. Patient offers no further specific complaints today.  ECOG FS:1 - Symptomatic but completely ambulatory  Review of systems- Review of Systems  Constitutional:  Positive for malaise/fatigue. Negative for chills and fever.  Respiratory:  Negative for cough and shortness of breath.   Cardiovascular:  Negative for chest pain.  Gastrointestinal:  Negative for abdominal pain, blood in stool, constipation, diarrhea and melena.  Genitourinary:  Negative for dysuria and frequency.  Skin:  Negative for rash.  Endo/Heme/Allergies:  Does not bruise/bleed easily.     Current treatment- Cisplatin + Docetaxel Q21days s/p cycle 3 (03/27/2022  No Known Allergies  Past Medical History:  Diagnosis Date   Arthritis    OA   Carpal tunnel syndrome    Depression    Endometriosis    Fibromyalgia    Hyperlipidemia    Left upper lobe pulmonary nodule 11/2021   Post herpetic neuralgia    Sleep disorder    Tobacco abuse     Past Surgical History:  Procedure Laterality Date   ABDOMINAL HYSTERECTOMY  1992   total endometriosis   APPENDECTOMY  1992   bladder tack  1992   CATARACT EXTRACTION W/PHACO Right 03/25/2018   Procedure: CATARACT EXTRACTION PHACO AND INTRAOCULAR LENS PLACEMENT ([place]) RIGHT;  Surgeon: Marchia Meiers, MD;  Location: [facility];  Service: Ophthalmology;  Laterality: Right;  [place]  01:11 CDE 8.02 Fluid pack lot # [id number]   CATARACT EXTRACTION W/PHACO Left 04/22/2018   Procedure: CATARACT EXTRACTION PHACO AND INTRAOCULAR LENS PLACEMENT ([place]) Left Eye;  Surgeon: Marchia Meiers, MD;  Location: [facility];  Service: Ophthalmology;  Laterality: Left;  [place]  01:02 CDE 8.11 Fluid pack lot # [id number]   COLONOSCOPY     FOOT SURGERY Right 04/2016   [place] in [place] great toe; straightening others   INTERCOSTAL NERVE BLOCK Left 12/12/2021   Procedure: INTERCOSTAL NERVE BLOCK;   Surgeon: [name], MD;  Location: [facility];  Service: Thoracic;  Laterality: Left;   IR IMAGING GUIDED PORT INSERTION  02/27/2022   LYMPH NODE DISSECTION Left 12/12/2021   Procedure: LYMPH NODE DISSECTION;  Surgeon: [name], MD;  Location: [facility];  Service: Thoracic;  Laterality: Left;   VIDEO BRONCHOSCOPY N/A 12/16/2021   Procedure: VIDEO BRONCHOSCOPY;  Surgeon: [name], MD;  Location: [facility];  Service: Thoracic;  Laterality: N/A;    Social History   Socioeconomic History   Marital status: Widowed    Spouse name: Not on file   Number of children: 2   Years of education: Not on file   Highest education level: Not on file  Occupational History   Not on file  Tobacco Use   Smoking status: Former    Packs/day: 1.00    Years: 55.00    Total pack years: 55.00    Types: Cigarettes    Quit date: 12/03/2021    Years since quitting: 0.3   Smokeless tobacco: Never  Vaping Use   Vaping Use: Some days  Substance  and Sexual Activity   Alcohol use: No    Alcohol/week: 0.0 standard drinks of alcohol   Drug use: No   Sexual activity: Not Currently  Other Topics Concern   Not on file  Social History Narrative   Lives alone   Social Determinants of Health   Financial Resource Strain: Low Risk  (02/01/2021)   Overall Financial Resource Strain (CARDIA)    Difficulty of Paying Living Expenses: Not hard at all  Food Insecurity: No Food Insecurity (02/01/2021)   Hunger Vital Sign    Worried About Running Out of Food in the Last Year: Never true    Ran Out of Food in the Last Year: Never true  Transportation Needs: No Transportation Needs (02/18/2022)   PRAPARE - Hydrologist (Medical): No    Lack of Transportation (Non-Medical): No  Physical Activity: Inactive (02/01/2021)   Exercise Vital Sign    Days of Exercise per Week: 0 days    Minutes of Exercise per Session: 0 min  Stress: No Stress Concern Present (02/01/2021)    [place]    Feeling of Stress : Not at all  Social Connections: [place] (02/01/2021)   Social Connection and Isolation Panel [NHANES]    Frequency of Communication with Friends and Family: More than three times a week    Frequency of Social Gatherings with Friends and Family: More than three times a week    Attends Religious Services: More than 4 times per year    Active Member of [employer] or Organizations: Yes    Attends Music therapist: More than 4 times per year    Marital Status: Married  Human resources officer Violence: Not At Risk (02/01/2021)   Humiliation, Afraid, Rape, and Kick questionnaire    Fear of Current or Ex-Partner: No    Emotionally Abused: No    Physically Abused: No    Sexually Abused: No    Family History  Problem Relation Age of Onset   Heart disease Mother        MI   Heart disease Father        MI   Arthritis Sister        crippling arthritis   Arthritis Brother        crippling arthritis     Current Outpatient Medications:    acetaminophen (TYLENOL) 500 MG tablet, Take 500 mg by mouth every 6 (six) hours as needed., Disp: , Rfl:    aspirin 81 MG tablet, Take 81 mg by mouth daily., Disp: , Rfl:    busPIRone (BUSPAR) 15 MG tablet, TAKE 1/2 TABLET BY MOUTH TWICE A DAY, Disp: 90 tablet, Rfl: 1   Cobalamin Combinations (NEURIVA PLUS PO), Take by mouth., Disp: , Rfl:    dexamethasone (DECADRON) 4 MG tablet, Take 1 tablet (4 mg total) by mouth 2 (two) times daily with a meal. Start 2 days prior to chemo. Take for 2 days; DO NOT take on the day of chemo., Disp: 60 tablet, Rfl: 0   docusate sodium (COLACE) 100 MG capsule, Take 200 mg by mouth daily., Disp: , Rfl:    DULoxetine (CYMBALTA) 60 MG capsule, TAKE 1 CAPSULE BY MOUTH EVERY DAY, Disp: 90 capsule, Rfl: 1   Fluticasone-Umeclidin-Vilant (TRELEGY ELLIPTA) 100-62.5-25 MCG/ACT AEPB, Inhale 1 puff into the lungs daily., Disp: 28  each, Rfl: 11   lidocaine-prilocaine (EMLA) cream, Apply on the port. 30 -45 min  prior to port  access., Disp: 30 g, Rfl: 3   MAGNESIUM CITRATE PO, Take by mouth daily., Disp: , Rfl:    Melatonin 10 MG CAPS, Take 20 mg by mouth at bedtime., Disp: , Rfl:    nystatin (MYCOSTATIN) 100000 UNIT/ML suspension, Take 5 mLs (500,000 Units total) by mouth 4 (four) times daily., Disp: 60 mL, Rfl: 0   ondansetron (ZOFRAN) 8 MG tablet, One pill every 8 hours as needed for nausea/vomitting., Disp: 40 tablet, Rfl: 1   pregabalin (LYRICA) 75 MG capsule, TAKE 1 CAPSULE BY MOUTH TWICE A DAY, Disp: 180 capsule, Rfl: 1   prochlorperazine (COMPAZINE) 10 MG tablet, Take 1 tablet (10 mg total) by mouth every 6 (six) hours as needed for nausea or vomiting., Disp: 40 tablet, Rfl: 1   senna (SENOKOT) 8.6 MG tablet, Take 1 tablet (8.6 mg total) by mouth 2 (two) times daily as needed for constipation., Disp: 14 tablet, Rfl: 0   traMADol (ULTRAM) 50 MG tablet, TAKE 1 TABLET BY MOUTH EVERY 8 HOURS AS NEEDED., Disp: 90 tablet, Rfl: 0   magic mouthwash (nystatin, lidocaine, diphenhydrAMINE, alum & mag hydroxide) suspension, Swish and spit 5 mLs 4 (four) times daily as needed for mouth pain. (Patient not taking: Reported on 03/26/2022), Disp: 240 mL, Rfl: 1   naproxen sodium (ALEVE) 220 MG tablet, Take 440 mg by mouth daily as needed (headaches). (Patient not taking: Reported on 03/26/2022), Disp: , Rfl:  No current facility-administered medications for this visit.  Facility-Administered Medications Ordered in Other Visits:    0.9 %  sodium chloride infusion, , Intravenous, Continuous, Tanayia Wahlquist M, PA-C, Stopped at 02/17/22 1214   sodium chloride flush (NS) 0.9 % injection 10 mL, 10 mL, Intravenous, Once, Verlon Au, NP  Physical exam:  Vitals:   04/02/22 0938  BP: 123/73  Pulse: 85  Temp: 98.4 F (36.9 C)  TempSrc: Tympanic  SpO2: 99%  Weight: 170 lb (77.1 kg)   Physical Exam Vitals and nursing note  reviewed.  Constitutional:      General: She is not in acute distress.    Appearance: Normal appearance. She is not ill-appearing, toxic-appearing or diaphoretic.  HENT:     Head: Normocephalic.  Eyes:     Extraocular Movements: Extraocular movements intact.     Pupils: Pupils are equal, round, and reactive to light.  Cardiovascular:     Rate and Rhythm: Normal rate and regular rhythm.     Heart sounds: Normal heart sounds.  Pulmonary:     Effort: Pulmonary effort is normal.     Breath sounds: Normal breath sounds.  Abdominal:     General: Bowel sounds are normal.     Palpations: Abdomen is soft.  Skin:    General: Skin is warm.     Coloration: Skin is not pale.     Findings: No rash.  Neurological:     Mental Status: She is alert.         Latest Ref Rng & Units 04/02/2022    9:29 AM  CMP  Glucose 70 - 99 mg/dL 109   BUN 8 - 23 mg/dL 17   Creatinine 0.44 - 1.00 mg/dL 0.68   Sodium 135 - 145 mmol/L 135   Potassium 3.5 - 5.1 mmol/L 3.5   Chloride 98 - 111 mmol/L 101   CO2 22 - 32 mmol/L 24   Calcium 8.9 - 10.3 mg/dL 9.0       Latest Ref Rng & Units 04/02/2022    9:29 AM  CBC  WBC 4.0 - 10.5 K/uL 5.2   Hemoglobin 12.0 - 15.0 g/dL 10.9   Hematocrit 36.0 - 46.0 % 32.0   Platelets 150 - 400 K/uL 97    No images are attached to the encounter.  DG Chest 2 View  Result Date: 03/25/2022 CLINICAL DATA:  Lung nodule. History of lymph node dissection 12/13/2019. Some shortness of breath. History of left upper lobectomy or primary lung cancer. EXAM: CHEST - 2 VIEW COMPARISON:  Chest two views 03/16/2021, 12/24/2021, 12/21/2021; CT chest 12/15/2021 FINDINGS: Right chest wall porta catheter tip overlies the central superior vena cava, new from 01/14/2022. This was placed 02/27/2022, seen on intra procedural fluoroscopy images on PACS. Mild calcification within the aortic arch. The visualized cardiac silhouette is unremarkable. Slight improvement in mild-to-moderate left pleural  effusion. The right lung is clear. No pneumothorax is seen. Mild-to-moderate multilevel degenerative disc changes of the thoracic spine. IMPRESSION: Right chest wall porta catheter tip overlies the central superior vena cava, new from 01/14/2022. Slight improvement in mild-to-moderate left pleural effusion. Electronically Signed   By: Yvonne Kendall M.D.   On: 03/25/2022 11:34    Assessment and plan- Patient is a 72 y.o. female   Lung Cancer- stage IIB, receiving cisplatin + docetaxel s/p cycle 3 of 4 on 03/27/22. No changes to plan at this time.  Vomiting- secondary to chemotherapy. Happy to hear this has almost fully resolved. Labs have improved. Today we will support her with 1L IVF as she states that drinking fluids tends to make her nausea worse. Continue PO intake. Pt declined [facility] to [facility] for follow up in 1 week but is aware that she can call to schedule if symptoms return. Continue regular follow up.  Diarrhea- now resolved.  Rib pain- Resolved   Disposition: 1L IVF today [facility] PRN      Visit Diagnosis 1. Cancer of upper lobe of left lung (HCC)   2. Dehydration   3. Chemotherapy induced nausea and vomiting     Patient expressed understanding and was in agreement with this plan. She also understands that She can call clinic at any time with any questions, concerns, or complaints.   Thank you for allowing me to participate in the care of this very pleasant patient.   Beckey Rutter, DNP, AGNP-C, [place] [facility] [place]  CC:

## 2022-04-03 ENCOUNTER — Encounter: Payer: Self-pay | Admitting: Internal Medicine

## 2022-04-16 ENCOUNTER — Inpatient Hospital Stay: Payer: Medicare Other

## 2022-04-16 ENCOUNTER — Inpatient Hospital Stay: Payer: Medicare Other | Attending: Internal Medicine | Admitting: Internal Medicine

## 2022-04-16 ENCOUNTER — Ambulatory Visit (INDEPENDENT_AMBULATORY_CARE_PROVIDER_SITE_OTHER): Payer: Medicare Other

## 2022-04-16 VITALS — Ht 65.0 in | Wt 183.0 lb

## 2022-04-16 VITALS — BP 148/75 | HR 80 | Temp 98.3°F | Resp 16 | Ht 64.0 in | Wt 183.8 lb

## 2022-04-16 DIAGNOSIS — R112 Nausea with vomiting, unspecified: Secondary | ICD-10-CM | POA: Insufficient documentation

## 2022-04-16 DIAGNOSIS — R197 Diarrhea, unspecified: Secondary | ICD-10-CM | POA: Insufficient documentation

## 2022-04-16 DIAGNOSIS — D61810 Antineoplastic chemotherapy induced pancytopenia: Secondary | ICD-10-CM | POA: Diagnosis not present

## 2022-04-16 DIAGNOSIS — Z0000 Encounter for general adult medical examination without abnormal findings: Secondary | ICD-10-CM

## 2022-04-16 DIAGNOSIS — R630 Anorexia: Secondary | ICD-10-CM | POA: Diagnosis not present

## 2022-04-16 DIAGNOSIS — Z902 Acquired absence of lung [part of]: Secondary | ICD-10-CM | POA: Insufficient documentation

## 2022-04-16 DIAGNOSIS — D6481 Anemia due to antineoplastic chemotherapy: Secondary | ICD-10-CM | POA: Diagnosis not present

## 2022-04-16 DIAGNOSIS — C3412 Malignant neoplasm of upper lobe, left bronchus or lung: Secondary | ICD-10-CM | POA: Insufficient documentation

## 2022-04-16 DIAGNOSIS — Z7952 Long term (current) use of systemic steroids: Secondary | ICD-10-CM | POA: Diagnosis not present

## 2022-04-16 DIAGNOSIS — E871 Hypo-osmolality and hyponatremia: Secondary | ICD-10-CM | POA: Insufficient documentation

## 2022-04-16 DIAGNOSIS — R634 Abnormal weight loss: Secondary | ICD-10-CM | POA: Insufficient documentation

## 2022-04-16 DIAGNOSIS — Z452 Encounter for adjustment and management of vascular access device: Secondary | ICD-10-CM | POA: Diagnosis not present

## 2022-04-16 DIAGNOSIS — Z1231 Encounter for screening mammogram for malignant neoplasm of breast: Secondary | ICD-10-CM | POA: Diagnosis not present

## 2022-04-16 DIAGNOSIS — T451X5A Adverse effect of antineoplastic and immunosuppressive drugs, initial encounter: Secondary | ICD-10-CM | POA: Insufficient documentation

## 2022-04-16 DIAGNOSIS — Z5189 Encounter for other specified aftercare: Secondary | ICD-10-CM | POA: Diagnosis not present

## 2022-04-16 DIAGNOSIS — Z87891 Personal history of nicotine dependence: Secondary | ICD-10-CM | POA: Diagnosis not present

## 2022-04-16 DIAGNOSIS — Z5111 Encounter for antineoplastic chemotherapy: Secondary | ICD-10-CM | POA: Insufficient documentation

## 2022-04-16 LAB — COMPREHENSIVE METABOLIC PANEL
ALT: 12 U/L (ref 0–44)
AST: 18 U/L (ref 15–41)
Albumin: 3.5 g/dL (ref 3.5–5.0)
Alkaline Phosphatase: 99 U/L (ref 38–126)
Anion gap: 7 (ref 5–15)
BUN: 14 mg/dL (ref 8–23)
CO2: 23 mmol/L (ref 22–32)
Calcium: 9.2 mg/dL (ref 8.9–10.3)
Chloride: 103 mmol/L (ref 98–111)
Creatinine, Ser: 0.58 mg/dL (ref 0.44–1.00)
GFR, Estimated: >60 mL/min (ref >60)
Glucose, Bld: 123 mg/dL — ABNORMAL HIGH (ref 70–99)
Potassium: 4.3 mmol/L (ref 3.5–5.1)
Sodium: 133 mmol/L — ABNORMAL LOW (ref 135–145)
Total Bilirubin: 0.3 mg/dL (ref 0.3–1.2)
Total Protein: 6.5 g/dL (ref 6.5–8.1)

## 2022-04-16 LAB — CBC WITH DIFFERENTIAL/PLATELET
Abs Immature Granulocytes: 0.1 10*3/uL — ABNORMAL HIGH (ref 0.00–0.07)
Basophils Absolute: 0 10*3/uL (ref 0.0–0.1)
Basophils Relative: 0 %
Eosinophils Absolute: 0 10*3/uL (ref 0.0–0.5)
Eosinophils Relative: 0 %
HCT: 31.4 % — ABNORMAL LOW (ref 36.0–46.0)
Hemoglobin: 10.4 g/dL — ABNORMAL LOW (ref 12.0–15.0)
Immature Granulocytes: 1 %
Lymphocytes Relative: 13 %
Lymphs Abs: 1.3 10*3/uL (ref 0.7–4.0)
MCH: 30.4 pg (ref 26.0–34.0)
MCHC: 33.1 g/dL (ref 30.0–36.0)
MCV: 91.8 fL (ref 80.0–100.0)
Monocytes Absolute: 0.7 10*3/uL (ref 0.1–1.0)
Monocytes Relative: 7 %
Neutro Abs: 8 10*3/uL — ABNORMAL HIGH (ref 1.7–7.7)
Neutrophils Relative %: 79 %
Platelets: 267 10*3/uL (ref 150–400)
RBC: 3.42 MIL/uL — ABNORMAL LOW (ref 3.87–5.11)
RDW: 18.5 % — ABNORMAL HIGH (ref 11.5–15.5)
WBC: 10.2 10*3/uL (ref 4.0–10.5)
nRBC: 0 % (ref 0.0–0.2)

## 2022-04-16 LAB — MAGNESIUM: Magnesium: 2.1 mg/dL (ref 1.7–2.4)

## 2022-04-16 MED ORDER — SODIUM CHLORIDE 0.9 % IV SOLN
INTRAVENOUS | Status: DC
  Filled 2022-04-16: qty 250

## 2022-04-16 MED ORDER — SODIUM CHLORIDE 0.9% FLUSH
10.0000 mL | Freq: Once | INTRAVENOUS | Status: AC
  Administered 2022-04-16: 10 mL via INTRAVENOUS
  Filled 2022-04-16: qty 10

## 2022-04-16 MED ORDER — HEPARIN SOD (PORK) LOCK FLUSH 100 UNIT/ML IV SOLN
500.0000 [IU] | Freq: Once | INTRAVENOUS | Status: AC
  Administered 2022-04-16: 500 [IU] via INTRAVENOUS
  Filled 2022-04-16: qty 5

## 2022-04-16 MED FILL — Dexamethasone Sodium Phosphate Inj 100 MG/10ML: INTRAMUSCULAR | Qty: 1 | Status: AC

## 2022-04-16 MED FILL — Fosaprepitant Dimeglumine For IV Infusion 150 MG (Base Eq): INTRAVENOUS | Qty: 5 | Status: AC

## 2022-04-16 NOTE — Progress Notes (Signed)
Patient denies new problems/concerns today.    Feeling much better today with no more nausea/vomiting and diarrhea.  Improved appetite with 13 lb wt gain.

## 2022-04-16 NOTE — Patient Instructions (Signed)
[name] [name] , Thank you for taking time to come for your Medicare Wellness Visit. I appreciate your ongoing commitment to your health goals. Please review the following plan we discussed and let me know if I can assist you in the future.   These are the goals we discussed:  Goals      Patient Stated     Starting 11/24/2017, I will continue to take medications as prescribed.      Patient Stated     Starting 11/24/2018, Patient wants to improve her mental health and anxiety issues.      Patient Stated     01/31/2020, I will continue to walk everyday for about 20 minutes.     Patient Stated     Would like to walk more and drink more water        This is a list of the screening recommended for you and due dates:  Health Maintenance  Topic Date Due   COVID-19 Vaccine (1) Never done   Zoster (Shingles) Vaccine (1 of 2) Never done   DTaP/Tdap/Td vaccine (3 - Tdap) 03/03/2017   Mammogram  12/03/2018   Cologuard (Stool DNA test)  12/02/2019   Flu Shot  06/01/2022*   Medicare Annual Wellness Visit  04/17/2023   Pneumonia Vaccine  Completed   DEXA scan (bone density measurement)  Completed   Hepatitis C Screening: USPSTF Recommendation to screen - Ages 48-79 yo.  Completed   HPV Vaccine  Aged Out  *Topic was postponed. The date shown is not the original due date.    Advanced directives: yes  Conditions/risks identified: low falls risk  Next appointment: Follow up in one year for your annual wellness visit 04/21/2023 @2pm  telephone   Preventive Care 65 Years and Older, Female Preventive care refers to lifestyle choices and visits with your health care provider that can promote health and wellness. What does preventive care include? A yearly physical exam. This is also called an annual well check. Dental exams once or twice a year. Routine eye exams. Ask your health care provider how often you should have your eyes checked. Personal lifestyle choices, including: Daily care of  your teeth and gums. Regular physical activity. Eating a healthy diet. Avoiding tobacco and drug use. Limiting alcohol use. Practicing safe sex. Taking low-dose aspirin every day. Taking vitamin and mineral supplements as recommended by your health care provider. What happens during an annual well check? The services and screenings done by your health care provider during your annual well check will depend on your age, overall health, lifestyle risk factors, and family history of disease. Counseling  Your health care provider may ask you questions about your: Alcohol use. Tobacco use. Drug use. Emotional well-being. Home and relationship well-being. Sexual activity. Eating habits. History of falls. Memory and ability to understand (cognition). Work and work Statistician. Reproductive health. Screening  You may have the following tests or measurements: Height, weight, and BMI. Blood pressure. Lipid and cholesterol levels. These may be checked every 5 years, or more frequently if you are over 75 years old. Skin check. Lung cancer screening. You may have this screening every year starting at age 74 if you have a 30-pack-year history of smoking and currently smoke or have quit within the past 15 years. Fecal occult blood test (FOBT) of the stool. You may have this test every year starting at age 68. Flexible sigmoidoscopy or colonoscopy. You may have a sigmoidoscopy every 5 years or a colonoscopy every 10 years starting  at age 8. Hepatitis C blood test. Hepatitis B blood test. Sexually transmitted disease (STD) testing. Diabetes screening. This is done by checking your blood sugar (glucose) after you have not eaten for a while (fasting). You may have this done every 1-3 years. Bone density scan. This is done to screen for osteoporosis. You may have this done starting at age 83. Mammogram. This may be done every 1-2 years. Talk to your health care provider about how often you should  have regular mammograms. Talk with your health care provider about your test results, treatment options, and if necessary, the need for more tests. Vaccines  Your health care provider may recommend certain vaccines, such as: Influenza vaccine. This is recommended every year. Tetanus, diphtheria, and acellular pertussis (Tdap, Td) vaccine. You may need a Td booster every 10 years. Zoster vaccine. You may need this after age 40. Pneumococcal 13-valent conjugate (PCV13) vaccine. One dose is recommended after age 59. Pneumococcal polysaccharide (PPSV23) vaccine. One dose is recommended after age 19. Talk to your health care provider about which screenings and vaccines you need and how often you need them. This information is not intended to replace advice given to you by your health care provider. Make sure you discuss any questions you have with your health care provider. Document Released: 03/16/2015 Document Revised: 11/07/2015 Document Reviewed: 12/19/2014 Elsevier Interactive Patient Education  2017 [employer] Prevention in the Home Falls can cause injuries. They can happen to people of all ages. There are many things you can do to make your home safe and to help prevent falls. What can I do on the outside of my home? Regularly fix the edges of walkways and driveways and fix any cracks. Remove anything that might make you trip as you walk through a door, such as a raised step or threshold. Trim any bushes or trees on the path to your home. Use bright outdoor lighting. Clear any walking paths of anything that might make someone trip, such as rocks or tools. Regularly check to see if handrails are loose or broken. Make sure that both sides of any steps have handrails. Any raised decks and porches should have guardrails on the edges. Have any leaves, snow, or ice cleared regularly. Use sand or salt on walking paths during winter. Clean up any spills in your garage right away. This  includes oil or grease spills. What can I do in the bathroom? Use night lights. Install grab bars by the toilet and in the tub and shower. Do not use towel bars as grab bars. Use non-skid mats or decals in the tub or shower. If you need to sit down in the shower, use a plastic, non-slip stool. Keep the floor dry. Clean up any water that spills on the floor as soon as it happens. Remove soap buildup in the tub or shower regularly. Attach bath mats securely with double-sided non-slip rug tape. Do not have throw rugs and other things on the floor that can make you trip. What can I do in the bedroom? Use night lights. Make sure that you have a light by your bed that is easy to reach. Do not use any sheets or blankets that are too big for your bed. They should not hang down onto the floor. Have a firm chair that has side arms. You can use this for support while you get dressed. Do not have throw rugs and other things on the floor that can make you trip. What can  I do in the kitchen? Clean up any spills right away. Avoid walking on wet floors. Keep items that you use a lot in easy-to-reach places. If you need to reach something above you, use a strong step stool that has a grab bar. Keep electrical cords out of the way. Do not use floor polish or wax that makes floors slippery. If you must use wax, use non-skid floor wax. Do not have throw rugs and other things on the floor that can make you trip. What can I do with my stairs? Do not leave any items on the stairs. Make sure that there are handrails on both sides of the stairs and use them. Fix handrails that are broken or loose. Make sure that handrails are as long as the stairways. Check any carpeting to make sure that it is firmly attached to the stairs. Fix any carpet that is loose or worn. Avoid having throw rugs at the top or bottom of the stairs. If you do have throw rugs, attach them to the floor with carpet tape. Make sure that you  have a light switch at the top of the stairs and the bottom of the stairs. If you do not have them, ask someone to add them for you. What else can I do to help prevent falls? Wear shoes that: Do not have high heels. Have rubber bottoms. Are comfortable and fit you well. Are closed at the toe. Do not wear sandals. If you use a stepladder: Make sure that it is fully opened. Do not climb a closed stepladder. Make sure that both sides of the stepladder are locked into place. Ask someone to hold it for you, if possible. Clearly mark and make sure that you can see: Any grab bars or handrails. First and last steps. Where the edge of each step is. Use tools that help you move around (mobility aids) if they are needed. These include: Canes. Walkers. Scooters. Crutches. Turn on the lights when you go into a dark area. Replace any light bulbs as soon as they burn out. Set up your furniture so you have a clear path. Avoid moving your furniture around. If any of your floors are uneven, fix them. If there are any pets around you, be aware of where they are. Review your medicines with your doctor. Some medicines can make you feel dizzy. This can increase your chance of falling. Ask your doctor what other things that you can do to help prevent falls. This information is not intended to replace advice given to you by your health care provider. Make sure you discuss any questions you have with your health care provider. Document Released: 12/14/2008 Document Revised: 07/26/2015 Document Reviewed: 03/24/2014 Elsevier Interactive Patient Education  2017 [employer].

## 2022-04-16 NOTE — Assessment & Plan Note (Addendum)
#   Left upper lobe-lung cancer moderately differentiated squamous cell carcinoma-pT1pN1- [IIB]; PDL-1 =70%. EGFR status-WILD.   adjuvant chemotherapy-cisplatin Taxotere x 4 cycles; followed by adjuvant Atezo/Pembrolizumab x1 year.   # Proceed with cycle # 4 of planned #4- adjuvant chemotherapy cisplatin-Taxotere every 3 weeks. Labs today reviewed; awaiting on CMP.  Will repeat CT san after cycle #4-ordered; and plan immunotherapy in 1 month later.   # Nausea/vomiting-secondary chemotherapy-improved with IV fluids.  Improved; continue dexamethasone as ordered.  HOLD off  Zyprexa for now.   Plan IV fluids post udenyca.   # IV Access: port  # DISPOSITION:  # chemo as planned tomorrow  # on 12/16  IVFs 1 lit- with udenyca   # follow up in 1 week- APP;labs- cbc/bmp;mag; possible IVFs over 1 hour  # pt requesting AM appts- Follow up in 3 weeks-WED- MD;port- labs- cbc/cmp;mag; CT scan prior- Dr.B

## 2022-04-16 NOTE — Progress Notes (Signed)
[facility] [facility] CONSULT NOTE  Patient Care Team: Tower, [name], MD as PCP - General Telford Nab, RN as Oncology Nurse Navigator Cammie Sickle, MD as Consulting Physician (Internal Medicine)  CHIEF COMPLAINTS/PURPOSE OF CONSULTATION: lung cancer  #  Oncology History Overview Note  JULY 2023- 1. 10 mm mean diameter nodule in the left upper lobe adjacent to an area of pleural and parenchymal scarring.     SEP 2023- 1. Hypermetabolic spiculated nodular left upper lobe pulmonary lesion is compatible with primary bronchogenic neoplasm. 2. Mildly metabolic nodularity in the left hilum, without discrete adenopathy may reflect early nodal disease involvement. Suggest attention on follow-up imaging. 3. No evidence of distant hypermetabolic metastatic disease. 4. Hypermetabolic nodule in the left lobe of the thyroid, Recommend thyroid US and biopsy (ref: J Am Coll Radiol. 2015 Feb;12(2): 143-50). 5. Aortic Atherosclerosis (ICD10-I70.0) and Emphysema (ICD10-J43.9).  # LUNG: Resection  Synchronous Tumors: Not applicable Total Number of Primary Tumors: 1 Procedure: Lobectomy, lung Specimen Laterality: Left Tumor Focality: Unifocal Tumor Site: Upper lobe Tumor Size: 2.6 cm Histologic Type: Squamous cell carcinoma, moderately differentiated Visceral Pleura Invasion: Not identified Direct Invasion of Adjacent Structures: No adjacent structures present Lymphovascular Invasion: Not identified Margins: All margins negative for invasive carcinoma      Closest Margin(s) to Invasive Carcinoma: Bronchovascular margin      Margin(s) Involved by Invasive Carcinoma: Not applicable       Margin Status for Non-Invasive Tumor: Negative Treatment Effect: No known presurgical therapy Regional Lymph Nodes:      Number of Lymph Nodes Involved: 1                           Nodal Sites with Tumor: Level 10      Number of Lymph Nodes Examined: 21                      Nodal Sites  Examined: Levels 4, 5, 7, 9, 10, 11, 12 and 13 Distant Metastasis:      Distant Site(s) Involved: Not applicable Pathologic Stage Classification (pTNM, AJCC 8th Edition): pT1c, pN1 Ancillary Studies: Can be performed upon request Representative Tumor Block: B5 Comment(s): None  # DEC 14th, 2023- Cisplatin-Taxotere #1; D-2 [name] x4 cycles    Lung cancer ([place])  12/05/2021 Initial Diagnosis   Lung cancer ([place])   12/18/2021 Cancer Staging   Staging form: Lung, AJCC 8th Edition - Pathologic stage from 12/18/2021: Stage IIB (pT1c, pN1, cM0) - Signed by [name], MD on 12/18/2021 Histopathologic type: Squamous cell carcinoma, NOS   Cancer of upper lobe of left lung ([place])  01/03/2022 Initial Diagnosis   Cancer of upper lobe of left lung ([place])   01/03/2022 Cancer Staging   Staging form: Lung, AJCC 8th Edition - Pathologic: Stage IIB (pT1c, pN1, cM0) - Signed by Cammie Sickle, MD on 01/03/2022   02/13/2022 -  Chemotherapy   Patient is on Treatment Plan : LUNG Cisplatin + Docetaxel q21d      HISTORY OF PRESENTING ILLNESS: Ambulating independently.  Accompanied by daughter.  [name] [name] 72 y.o.  female history of smoking with stage II T2 N1 squamous cell carcinoma left lung cancer currently on adjuvant chemotherapy with cisplatin and Taxotere is here for follow-up.  Patient denies new problems/concerns today.  Feeling much better today with no more nausea/vomiting and diarrhea.  Improved appetite with 13 lb wt gain. Currently  Doing well. Chemo tx  is tomorrow.    Denies any significant leg swelling.  Denies any tingling or numbness.  Denies any worsening pain.  Denies any worsening shortness of breath.  Review of Systems  Constitutional:  Negative for chills, diaphoresis, fever, malaise/fatigue and weight loss.  HENT:  Negative for nosebleeds and sore throat.   Eyes:  Negative for double vision.  Respiratory:  Negative for cough, hemoptysis, sputum production,  shortness of breath and wheezing.   Cardiovascular:  Negative for chest pain, palpitations, orthopnea and leg swelling.  Gastrointestinal:  Negative for abdominal pain, blood in stool, constipation, diarrhea, heartburn, melena, nausea and vomiting.  Genitourinary:  Negative for dysuria, frequency and urgency.  Musculoskeletal:  Negative for back pain and joint pain.  Skin: Negative.  Negative for itching and rash.  Neurological:  Negative for dizziness, tingling, focal weakness, weakness and headaches.  Endo/Heme/Allergies:  Does not bruise/bleed easily.  Psychiatric/Behavioral:  Negative for depression. The patient is not nervous/anxious and does not have insomnia.      MEDICAL HISTORY:  Past Medical History:  Diagnosis Date   Arthritis    OA   Carpal tunnel syndrome    Depression    Endometriosis    Fibromyalgia    Hyperlipidemia    Left upper lobe pulmonary nodule 11/2021   Post herpetic neuralgia    Sleep disorder    Tobacco abuse     SURGICAL HISTORY: Past Surgical History:  Procedure Laterality Date   ABDOMINAL HYSTERECTOMY  1992   total endometriosis   APPENDECTOMY  1992   bladder tack  1992   CATARACT EXTRACTION W/PHACO Right 03/25/2018   Procedure: CATARACT EXTRACTION PHACO AND INTRAOCULAR LENS PLACEMENT ([place]) RIGHT;  Surgeon: Marchia Meiers, MD;  Location: [facility];  Service: Ophthalmology;  Laterality: Right;  [place]  01:11 CDE 8.02 Fluid pack lot # [id number]   CATARACT EXTRACTION W/PHACO Left 04/22/2018   Procedure: CATARACT EXTRACTION PHACO AND INTRAOCULAR LENS PLACEMENT ([place]) Left Eye;  Surgeon: Marchia Meiers, MD;  Location: [facility];  Service: Ophthalmology;  Laterality: Left;  [place]  01:02 CDE 8.11 Fluid pack lot # [id number]   COLONOSCOPY     FOOT SURGERY Right 04/2016   [place] in [place] great toe; straightening others   INTERCOSTAL NERVE BLOCK Left 12/12/2021   Procedure: INTERCOSTAL NERVE BLOCK;  Surgeon: [name], MD;  Location: [facility];  Service: Thoracic;  Laterality: Left;   IR IMAGING GUIDED PORT INSERTION  02/27/2022   LYMPH NODE DISSECTION Left 12/12/2021   Procedure: LYMPH NODE DISSECTION;  Surgeon: [name], MD;  Location: [facility];  Service: Thoracic;  Laterality: Left;   VIDEO BRONCHOSCOPY N/A 12/16/2021   Procedure: VIDEO BRONCHOSCOPY;  Surgeon: [name], MD;  Location: [facility];  Service: Thoracic;  Laterality: N/A;    SOCIAL HISTORY: Social History   Socioeconomic History   Marital status: Widowed    Spouse name: Not on file   Number of children: 2   Years of education: Not on file   Highest education level: Not on file  Occupational History   Not on file  Tobacco Use   Smoking status: Former    Packs/day: 1.00    Years: 55.00    Total pack years: 55.00    Types: Cigarettes    Quit date: 12/03/2021    Years since quitting: 0.3   Smokeless tobacco: Never  Vaping Use   Vaping Use: Some days  Substance and Sexual Activity   Alcohol use: No  Alcohol/week: 0.0 standard drinks of alcohol   Drug use: No   Sexual activity: Not Currently  Other Topics Concern   Not on file  Social History Narrative   Lives alone   Social Determinants of Health   Financial Resource Strain: Low Risk  (02/01/2021)   Overall Financial Resource Strain (CARDIA)    Difficulty of Paying Living Expenses: Not hard at all  Food Insecurity: No Food Insecurity (02/01/2021)   Hunger Vital Sign    Worried About Running Out of Food in the Last Year: Never true    Ran Out of Food in the Last Year: Never true  Transportation Needs: No Transportation Needs (02/18/2022)   PRAPARE - Hydrologist (Medical): No    Lack of Transportation (Non-Medical): No  Physical Activity: Inactive (02/01/2021)   Exercise Vital Sign    Days of Exercise per Week: 0 days    Minutes of Exercise per Session: 0 min  Stress: No Stress Concern Present (02/01/2021)   [place]    Feeling of Stress : Not at all  Social Connections: [place] (02/01/2021)   Social Connection and Isolation Panel [NHANES]    Frequency of Communication with Friends and Family: More than three times a week    Frequency of Social Gatherings with Friends and Family: More than three times a week    Attends Religious Services: More than 4 times per year    Active Member of [employer] or Organizations: Yes    Attends Music therapist: More than 4 times per year    Marital Status: Married  Human resources officer Violence: Not At Risk (02/01/2021)   Humiliation, Afraid, Rape, and Kick questionnaire    Fear of Current or Ex-Partner: No    Emotionally Abused: No    Physically Abused: No    Sexually Abused: No    FAMILY HISTORY: Family History  Problem Relation Age of Onset   Heart disease Mother        MI   Heart disease Father        MI   Arthritis Sister        crippling arthritis   Arthritis Brother        crippling arthritis    ALLERGIES:  has No Known Allergies.  MEDICATIONS:  Current Outpatient Medications  Medication Sig Dispense Refill   acetaminophen (TYLENOL) 500 MG tablet Take 500 mg by mouth every 6 (six) hours as needed.     aspirin 81 MG tablet Take 81 mg by mouth daily.     busPIRone (BUSPAR) 15 MG tablet TAKE 1/2 TABLET BY MOUTH TWICE A DAY 90 tablet 1   Cobalamin Combinations (NEURIVA PLUS PO) Take by mouth.     dexamethasone (DECADRON) 4 MG tablet Take 1 tablet (4 mg total) by mouth 2 (two) times daily with a meal. Start 2 days prior to chemo. Take for 2 days; DO NOT take on the day of chemo. 60 tablet 0   docusate sodium (COLACE) 100 MG capsule Take 200 mg by mouth daily.     DULoxetine (CYMBALTA) 60 MG capsule TAKE 1 CAPSULE BY MOUTH EVERY DAY 90 capsule 1   Fluticasone-Umeclidin-Vilant (TRELEGY ELLIPTA) 100-62.5-25 MCG/ACT AEPB Inhale 1 puff into the lungs daily. 28 each 11   lidocaine-prilocaine (EMLA)  cream Apply on the port. 30 -45 min  prior to port access. 30 g 3   MAGNESIUM CITRATE PO Take by mouth daily.  Melatonin 10 MG CAPS Take 20 mg by mouth at bedtime.     ondansetron (ZOFRAN) 8 MG tablet One pill every 8 hours as needed for nausea/vomitting. 40 tablet 1   pregabalin (LYRICA) 75 MG capsule TAKE 1 CAPSULE BY MOUTH TWICE A DAY 180 capsule 1   prochlorperazine (COMPAZINE) 10 MG tablet Take 1 tablet (10 mg total) by mouth every 6 (six) hours as needed for nausea or vomiting. 40 tablet 1   traMADol (ULTRAM) 50 MG tablet TAKE 1 TABLET BY MOUTH EVERY 8 HOURS AS NEEDED. 90 tablet 0   magic mouthwash (nystatin, lidocaine, diphenhydrAMINE, alum & mag hydroxide) suspension Swish and spit 5 mLs 4 (four) times daily as needed for mouth pain. (Patient not taking: Reported on 03/26/2022) 240 mL 1   naproxen sodium (ALEVE) 220 MG tablet Take 440 mg by mouth daily as needed (headaches). (Patient not taking: Reported on 03/26/2022)     nystatin (MYCOSTATIN) 100000 UNIT/ML suspension Take 5 mLs (500,000 Units total) by mouth 4 (four) times daily. (Patient not taking: Reported on 04/16/2022) 60 mL 0   senna (SENOKOT) 8.6 MG tablet Take 1 tablet (8.6 mg total) by mouth 2 (two) times daily as needed for constipation. (Patient not taking: Reported on 04/16/2022) 14 tablet 0   No current facility-administered medications for this visit.   Facility-Administered Medications Ordered in Other Visits  Medication Dose Route Frequency Provider Last Rate Last Admin   0.9 %  sodium chloride infusion   Intravenous Continuous Hughie Closs, PA-C   Stopped at 02/17/22 1214   sodium chloride flush (NS) 0.9 % injection 10 mL  10 mL Intravenous Once Verlon Au, NP          .  PHYSICAL EXAMINATION: ECOG PERFORMANCE STATUS: 0 - Asymptomatic  Vitals:   04/16/22 0800  BP: (!) 148/75  Pulse: 80  Resp: 16  Temp: 98.3 F (36.8 C)   Filed Weights   04/16/22 0800  Weight: 183 lb 12.8 oz (83.4 kg)     Physical Exam Vitals and nursing note reviewed.  Constitutional:      Comments:     HENT:     Head: Normocephalic and atraumatic.     Mouth/Throat:     Mouth: Mucous membranes are moist.     Pharynx: No oropharyngeal exudate.  Eyes:     Pupils: Pupils are equal, round, and reactive to light.  Cardiovascular:     Rate and Rhythm: Normal rate and regular rhythm.  Pulmonary:     Effort: No respiratory distress.     Breath sounds: No wheezing.     Comments: Decreased breath sounds bilaterally at bases.  No wheeze or crackles Abdominal:     General: Bowel sounds are normal. There is no distension.     Palpations: Abdomen is soft. There is no mass.     Tenderness: There is no abdominal tenderness. There is no guarding or rebound.  Musculoskeletal:        General: No tenderness. Normal range of motion.     Cervical back: Normal range of motion and neck supple.  Skin:    General: Skin is warm.  Neurological:     Mental Status: She is alert and oriented to person, place, and time.  Psychiatric:        Mood and Affect: Affect normal.        Judgment: Judgment normal.      LABORATORY DATA:  I have reviewed the data as listed Lab Results  Component Value Date   WBC 10.2 04/16/2022   HGB 10.4 (L) 04/16/2022   HCT 31.4 (L) 04/16/2022   MCV 91.8 04/16/2022   PLT 267 04/16/2022   Recent Labs    03/26/22 1452 03/31/22 0951 04/02/22 0929 04/16/22 0826  NA 134* 133* 135 133*  K 5.1 3.1* 3.5 4.3  CL 99 94* 101 103  CO2 25 29 24 23   GLUCOSE 118* 129* 109* 123*  BUN 17 21 17 14   CREATININE 0.64 0.76 0.68 0.58  CALCIUM 9.7 9.1 9.0 9.2  GFRNONAA >60 >60 >60 >60  PROT 7.2 6.9  --  6.5  ALBUMIN 3.7 3.7  --  3.5  AST 17 23  --  18  ALT 13 19  --  12  ALKPHOS 103 128*  --  99  BILITOT 0.6 1.1  --  0.3    RADIOGRAPHIC STUDIES: I have personally reviewed the radiological images as listed and agreed with the findings in the report. DG Chest 2 View  Result Date:  03/25/2022 CLINICAL DATA:  Lung nodule. History of lymph node dissection 12/13/2019. Some shortness of breath. History of left upper lobectomy or primary lung cancer. EXAM: CHEST - 2 VIEW COMPARISON:  Chest two views 03/16/2021, 12/24/2021, 12/21/2021; CT chest 12/15/2021 FINDINGS: Right chest wall porta catheter tip overlies the central superior vena cava, new from 01/14/2022. This was placed 02/27/2022, seen on intra procedural fluoroscopy images on PACS. Mild calcification within the aortic arch. The visualized cardiac silhouette is unremarkable. Slight improvement in mild-to-moderate left pleural effusion. The right lung is clear. No pneumothorax is seen. Mild-to-moderate multilevel degenerative disc changes of the thoracic spine. IMPRESSION: Right chest wall porta catheter tip overlies the central superior vena cava, new from 01/14/2022. Slight improvement in mild-to-moderate left pleural effusion. Electronically Signed   By: Yvonne Kendall M.D.   On: 03/25/2022 11:34    ASSESSMENT & PLAN:   Cancer of upper lobe of left lung ([place]) # Left upper lobe-lung cancer moderately differentiated squamous cell carcinoma-pT1pN1- [IIB]; PDL-1 =70%. EGFR status-WILD.   adjuvant chemotherapy-cisplatin Taxotere x 4 cycles; followed by adjuvant Atezo/Pembrolizumab x1 year.   # Proceed with cycle # 4 of planned #4- adjuvant chemotherapy cisplatin-Taxotere every 3 weeks. Labs today reviewed; awaiting on CMP.  Will repeat CT [facility] after cycle #4-ordered; and plan immunotherapy in 1 month later.   # Nausea/vomiting-secondary chemotherapy-improved with IV fluids.  Improved; continue dexamethasone as ordered.  HOLD off  Zyprexa for now.   Plan IV fluids post udenyca.   # IV Access: port  # DISPOSITION:  # chemo as planned tomorrow  # on 12/16  IVFs 1 lit- with udenyca   # follow up in 1 week- APP;labs- cbc/bmp;mag; possible IVFs over 1 hour  # pt requesting AM appts- Follow up in 3 weeks-WED- MD;port- labs-  cbc/cmp;mag; CT scan prior- Dr.B  All questions were answered. The patient knows to call the clinic with any problems, questions or concerns.    Cammie Sickle, MD 04/16/2022 9:36 AM

## 2022-04-16 NOTE — Progress Notes (Signed)
I connected with  [name] [name] on 04/16/22 by a audio enabled telemedicine application and verified that I am speaking with the correct person using two identifiers.  Patient Location: Home  Provider Location: Office/Clinic  I discussed the limitations of evaluation and management by telemedicine. The patient expressed understanding and agreed to proceed.  Subjective:   [name] [name] is a 72 y.o. female who presents for Medicare Annual (Subsequent) preventive examination.  Review of Systems       Objective:    Today's Vitals   04/16/22 1329  Weight: 183 lb (83 kg)  Height: 5\' 5"  (1.651 m)   Body mass index is 30.45 kg/m.     04/16/2022    8:39 AM 04/02/2022    9:38 AM 03/31/2022    9:42 AM 03/26/2022    3:00 PM 03/12/2022   10:19 AM 03/05/2022    3:08 PM 02/27/2022   12:58 PM  Advanced Directives  Does Patient Have a Medical Advance Directive? Yes No Yes Yes Yes Yes No  Type of Primary school teacher of [employer] Power of [place];Living will [place];Living will   Does patient want to make changes to medical advance directive?    No - Patient declined     Copy of [place] in Chart?    No - copy requested     Would patient like information on creating a medical advance directive?       No - Patient declined    Current Medications (verified) Outpatient Encounter Medications as of 04/16/2022  Medication Sig   acetaminophen (TYLENOL) 500 MG tablet Take 500 mg by mouth every 6 (six) hours as needed.   aspirin 81 MG tablet Take 81 mg by mouth daily.   busPIRone (BUSPAR) 15 MG tablet TAKE 1/2 TABLET BY MOUTH TWICE A DAY   Cobalamin Combinations (NEURIVA PLUS PO) Take by mouth.   dexamethasone (DECADRON) 4 MG tablet Take 1 tablet (4 mg total) by mouth 2 (two) times daily with a meal. Start 2 days prior to chemo. Take for 2 days; DO NOT take on the day of chemo.   docusate sodium  (COLACE) 100 MG capsule Take 200 mg by mouth daily.   DULoxetine (CYMBALTA) 60 MG capsule TAKE 1 CAPSULE BY MOUTH EVERY DAY   Fluticasone-Umeclidin-Vilant (TRELEGY ELLIPTA) 100-62.5-25 MCG/ACT AEPB Inhale 1 puff into the lungs daily.   lidocaine-prilocaine (EMLA) cream Apply on the port. 30 -45 min  prior to port access.   MAGNESIUM CITRATE PO Take by mouth daily.   Melatonin 10 MG CAPS Take 20 mg by mouth at bedtime.   ondansetron (ZOFRAN) 8 MG tablet One pill every 8 hours as needed for nausea/vomitting.   pregabalin (LYRICA) 75 MG capsule TAKE 1 CAPSULE BY MOUTH TWICE A DAY   prochlorperazine (COMPAZINE) 10 MG tablet Take 1 tablet (10 mg total) by mouth every 6 (six) hours as needed for nausea or vomiting.   traMADol (ULTRAM) 50 MG tablet TAKE 1 TABLET BY MOUTH EVERY 8 HOURS AS NEEDED.   magic mouthwash (nystatin, lidocaine, diphenhydrAMINE, alum & mag hydroxide) suspension Swish and spit 5 mLs 4 (four) times daily as needed for mouth pain. (Patient not taking: Reported on 03/26/2022)   naproxen sodium (ALEVE) 220 MG tablet Take 440 mg by mouth daily as needed (headaches). (Patient not taking: Reported on 03/26/2022)   nystatin (MYCOSTATIN) 100000 UNIT/ML suspension Take 5 mLs (500,000 Units total) by mouth  4 (four) times daily. (Patient not taking: Reported on 04/16/2022)   senna (SENOKOT) 8.6 MG tablet Take 1 tablet (8.6 mg total) by mouth 2 (two) times daily as needed for constipation. (Patient not taking: Reported on 04/16/2022)   Facility-Administered Encounter Medications as of 04/16/2022  Medication   0.9 %  sodium chloride infusion   sodium chloride flush (NS) 0.9 % injection 10 mL   [DISCONTINUED] 0.9 %  sodium chloride infusion    Allergies (verified) Patient has no known allergies.   History: Past Medical History:  Diagnosis Date   Arthritis    OA   Carpal tunnel syndrome    Depression    Endometriosis    Fibromyalgia    Hyperlipidemia    Left upper lobe pulmonary nodule  11/2021   Post herpetic neuralgia    Sleep disorder    Tobacco abuse    Past Surgical History:  Procedure Laterality Date   ABDOMINAL HYSTERECTOMY  1992   total endometriosis   APPENDECTOMY  1992   bladder tack  1992   CATARACT EXTRACTION W/PHACO Right 03/25/2018   Procedure: CATARACT EXTRACTION PHACO AND INTRAOCULAR LENS PLACEMENT ([place]) RIGHT;  Surgeon: Marchia Meiers, MD;  Location: [facility] ORS;  Service: Ophthalmology;  Laterality: Right;  [place]  01:11 CDE 8.02 Fluid pack lot # [id number]   CATARACT EXTRACTION W/PHACO Left 04/22/2018   Procedure: CATARACT EXTRACTION PHACO AND INTRAOCULAR LENS PLACEMENT ([place]) Left Eye;  Surgeon: Marchia Meiers, MD;  Location: [facility] ORS;  Service: Ophthalmology;  Laterality: Left;  [place]  01:02 CDE 8.11 Fluid pack lot # [id number]   COLONOSCOPY     FOOT SURGERY Right 04/2016   [place] in [place] great toe; straightening others   INTERCOSTAL NERVE BLOCK Left 12/12/2021   Procedure: INTERCOSTAL NERVE BLOCK;  Surgeon: [name], MD;  Location: [facility] OR;  Service: Thoracic;  Laterality: Left;   IR IMAGING GUIDED PORT INSERTION  02/27/2022   LYMPH NODE DISSECTION Left 12/12/2021   Procedure: LYMPH NODE DISSECTION;  Surgeon: [name], MD;  Location: [place] [facility];  Service: Thoracic;  Laterality: Left;   VIDEO BRONCHOSCOPY N/A 12/16/2021   Procedure: VIDEO BRONCHOSCOPY;  Surgeon: [name], MD;  Location: [facility];  Service: Thoracic;  Laterality: N/A;   Family History  Problem Relation Age of Onset   Heart disease Mother        MI   Heart disease Father        MI   Arthritis Sister        crippling arthritis   Arthritis Brother        crippling arthritis   Social History   Socioeconomic History   Marital status: Widowed    Spouse name: Not on file   Number of children: 2   Years of education: Not on file   Highest education level: Not on file  Occupational History   Not on file  Tobacco Use   Smoking status: Former     Packs/day: 1.00    Years: 55.00    Total pack years: 55.00    Types: Cigarettes    Quit date: 12/03/2021    Years since quitting: 0.3   Smokeless tobacco: Never  Vaping Use   Vaping Use: Some days  Substance and Sexual Activity   Alcohol use: No    Alcohol/week: 0.0 standard drinks of alcohol   Drug use: No   Sexual activity: Not Currently  Other Topics Concern   Not on file  Social History  Narrative   Lives alone   Social Determinants of Health   Financial Resource Strain: Low Risk  (04/16/2022)   Overall Financial Resource Strain (CARDIA)    Difficulty of Paying Living Expenses: Not hard at all  Food Insecurity: No Food Insecurity (04/16/2022)   Hunger Vital Sign    Worried About Running Out of Food in the Last Year: Never true    Ran Out of Food in the Last Year: Never true  Transportation Needs: No Transportation Needs (04/16/2022)   PRAPARE - Hydrologist (Medical): No    Lack of Transportation (Non-Medical): No  Physical Activity: Inactive (04/16/2022)   Exercise Vital Sign    Days of Exercise per Week: 0 days    Minutes of Exercise per Session: 0 min  Stress: No Stress Concern Present (04/16/2022)   [place]    Feeling of Stress : Not at all  Social Connections: Moderately Integrated (04/16/2022)   Social Connection and Isolation Panel [NHANES]    Frequency of Communication with Friends and Family: More than three times a week    Frequency of Social Gatherings with Friends and Family: More than three times a week    Attends Religious Services: More than 4 times per year    Active Member of [employer] or Organizations: Yes    Attends Archivist Meetings: More than 4 times per year    Marital Status: Widowed    Tobacco Counseling Counseling given: Not Answered   Clinical Intake:  Pre-visit preparation completed: Yes  Pain : No/denies pain     BMI -  recorded: 30.45 Nutritional Status: BMI > 30  Obese Nutritional Risks: None Diabetes: No  How often do you need to have someone help you when you read instructions, pamphlets, or other written materials from your doctor or pharmacy?: 1 - Never  Diabetic?no  Interpreter Needed?: No  Information entered by :: B.Aaron Bostwick,LPN   Activities of Daily Living    04/16/2022    1:51 PM 02/27/2022   12:56 PM  In your present state of health, do you have any difficulty performing the following activities:  Hearing? 0 0  Vision? 0 0  Difficulty concentrating or making decisions? 0 0  Walking or climbing stairs? 0 0  Dressing or bathing? 0 0  Doing errands, shopping? 0   Preparing Food and eating ? N   Using the Toilet? N   In the past six months, have you accidently leaked urine? N   Do you have problems with loss of bowel control? N   Managing your Medications? N   Managing your Finances? N   Housekeeping or managing your Housekeeping? N     Patient Care Team: [place], [name], MD as PCP - General Telford Nab, RN as Oncology Nurse Navigator Cammie Sickle, MD as Consulting Physician (Internal Medicine)  Indicate any recent Medical Services you may have received from other than [facility] providers in the past year (date may be approximate).     Assessment:   This is a routine wellness examination for Lavelle.  Hearing/Vision screen Hearing Screening - Comments:: Adequate hearing Vision Screening - Comments:: Adequate vision w/readers. [facility]  Dietary issues and exercise activities discussed: Current Exercise Habits: The patient does not participate in regular exercise at present, Exercise limited by: Other - see comments (low energy with cancer treatments)   Goals Addressed  This Visit's Progress    Patient Stated   On track    Starting 11/24/2017, I will continue to take medications as prescribed.      Patient Stated   Not on track    01/31/2020, I  will continue to walk everyday for about 20 minutes.       Depression Screen    04/16/2022    1:35 PM 08/14/2021   10:29 AM 02/01/2021   10:35 AM 01/31/2020   10:30 AM 11/24/2018   11:59 AM 11/24/2017   10:32 AM 09/12/2016    1:19 PM  PHQ 2/9 Scores  PHQ - 2 Score 0 0 0 0 0 2 0  PHQ- 9 Score    0 0 8     Fall Risk    04/16/2022    1:32 PM 08/14/2021   10:28 AM 02/01/2021   10:34 AM 01/31/2020   10:29 AM 11/24/2018   11:59 AM  Fall Risk   Falls in the past year? 0 0 0 0 0  Number falls in past yr: 0 0 0 0 0  Injury with Fall? 0 0 0 0   Risk for fall due to : No Fall Risks No Fall Risks No Fall Risks Medication side effect Medication side effect  Follow up Education provided;Falls prevention discussed Falls evaluation completed Falls prevention discussed Falls evaluation completed;Falls prevention discussed Falls evaluation completed;Falls prevention discussed    FALL RISK PREVENTION PERTAINING TO THE HOME:  Any stairs in or around the home? No  If so, are there any without handrails? No  Home free of loose throw rugs in walkways, pet beds, electrical cords, etc? Yes  Adequate lighting in your home to reduce risk of falls? Yes   ASSISTIVE DEVICES UTILIZED TO PREVENT FALLS:  Life alert? No  Use of a cane, walker or w/c? No  Grab bars in the bathroom? Yes  Shower chair or bench in shower? Yes  Elevated toilet seat or a handicapped toilet? Yes    Cognitive Function:    01/31/2020   10:33 AM 11/24/2018   12:02 PM 11/24/2017   10:32 AM 09/12/2016    1:20 PM  MMSE - Mini Mental State Exam  Orientation to time 5 5 5 5   Orientation to Place 5 5 5 5   Registration 3 3 3 3   Attention/ Calculation 5 0 0 0  Recall 3 3 3 3   Language- name 2 objects   0 0  Language- repeat 1 1 1 1   Language- follow 3 step command   3 3  Language- read & follow direction   0 0  Write a sentence   0 0  Copy design   0 0  Total score   20 20        04/16/2022    1:40 PM  6CIT Screen  What  Year? 0 points  What month? 0 points  What time? 0 points  Count back from 20 0 points  Months in reverse 0 points  Repeat phrase 0 points  Total Score 0 points    Immunizations Immunization History  Administered Date(s) Administered   Fluad Quad(high Dose 65+) 11/30/2018, 02/07/2020   Influenza Split 01/22/2011, 03/24/2012   Influenza, High Dose Seasonal PF 11/07/2016   Influenza,inj,Quad PF,6+ Mos 11/30/2012, 12/06/2014, 11/22/2015, 11/24/2017   Pneumococcal Conjugate-13 01/02/2015   Pneumococcal Polysaccharide-23 01/22/2011, 09/12/2016   Td 05/01/1998, 03/04/2007    TDAP status: Up to date  Flu Vaccine status: Up to date  Pneumococcal vaccine  status: Up to date  Covid-19 vaccine status: Declined, Education has been provided regarding the importance of this vaccine but patient still declined. Advised may receive this vaccine at local pharmacy or Health Dept.or vaccine clinic. Aware to provide a copy of the vaccination record if obtained from local pharmacy or Health Dept. Verbalized acceptance and understanding.  Qualifies for Shingles Vaccine? Yes   Zostavax completed No   Shingrix Completed?: No.    Education has been provided regarding the importance of this vaccine. Patient has been advised to call insurance company to determine out of pocket expense if they have not yet received this vaccine. Advised may also receive vaccine at local pharmacy or Health Dept. Verbalized acceptance and understanding. Pt declines  Screening Tests Health Maintenance  Topic Date Due   COVID-19 Vaccine (1) Never done   Zoster Vaccines- Shingrix (1 of 2) Never done   DTaP/Tdap/Td (3 - Tdap) 03/03/2017   MAMMOGRAM  12/03/2018   Fecal DNA (Cologuard)  12/02/2019   INFLUENZA VACCINE  06/01/2022 (Originally 10/01/2021)   Medicare Annual Wellness (AWV)  04/17/2023   Pneumonia Vaccine 83+ Years old  Completed   DEXA SCAN  Completed   Hepatitis C Screening  Completed   HPV VACCINES  Aged Out     Health Maintenance  Health Maintenance Due  Topic Date Due   COVID-19 Vaccine (1) Never done   Zoster Vaccines- Shingrix (1 of 2) Never done   DTaP/Tdap/Td (3 - Tdap) 03/03/2017   MAMMOGRAM  12/03/2018   Fecal DNA (Cologuard)  12/02/2019    Colorectal cancer screening: Type of screening: Cologuard. Completed yes. Repeat every 5 years  Mammogram status: Ordered yes. Pt provided with contact info and advised to call to schedule appt.   Bone Density status: Completed yes. Results reflect: Bone density results: NORMAL. Repeat every 5 years.  Lung Cancer Screening: (Low Dose CT Chest recommended if Age 87-80 years, 30 pack-year currently smoking OR have quit w/in 15years.) does qualify.   Lung Cancer Screening Referral: yes, next scan 05/07/22  Additional Screening:  Hepatitis C Screening: does not qualify; Completed yes  Vision Screening: Recommended annual ophthalmology exams for early detection of glaucoma and other disorders of the eye. Is the patient up to date with their annual eye exam?  Yes  Who is the provider or what is the name of the office in which the patient attends annual eye exams? [facility] If pt is not established with a provider, would they like to be referred to a provider to establish care? No .   Dental Screening: Recommended annual dental exams for proper oral hygiene  Community Resource Referral / Chronic Care Management: CRR required this visit?  No   CCM required this visit?  No    Plan:     I have personally reviewed and noted the following in the patient's chart:   Medical and social history Use of alcohol, tobacco or illicit drugs  Current medications and supplements including opioid prescriptions. Patient is currently taking opioid prescriptions. Information provided to patient regarding non-opioid alternatives. Patient advised to discuss non-opioid treatment plan with their provider. Functional ability and status Nutritional  status Physical activity Advanced directives List of other physicians Hospitalizations, surgeries, and ER visits in previous 12 months Vitals Screenings to include cognitive, depression, and falls Referrals and appointments  In addition, I have reviewed and discussed with patient certain preventive protocols, quality metrics, and best practice recommendations. A written personalized care plan for preventive services as well as  general preventive health recommendations were provided to patient.     Roger Shelter, LPN   [id number]/[id number]   Nurse Notes: pt sts she is doing well with cancer treatments. She is excited that her last tx is tomorrow. Pt requests for any medications to be refilled that are expiring.  MMG ordered:pt sts she will make appt when she is able soon.

## 2022-04-17 ENCOUNTER — Inpatient Hospital Stay: Payer: Medicare Other

## 2022-04-17 ENCOUNTER — Other Ambulatory Visit: Payer: Self-pay

## 2022-04-17 VITALS — BP 150/76 | HR 67 | Temp 98.8°F | Resp 20

## 2022-04-17 DIAGNOSIS — Z5111 Encounter for antineoplastic chemotherapy: Secondary | ICD-10-CM | POA: Diagnosis not present

## 2022-04-17 DIAGNOSIS — R112 Nausea with vomiting, unspecified: Secondary | ICD-10-CM | POA: Diagnosis not present

## 2022-04-17 DIAGNOSIS — E871 Hypo-osmolality and hyponatremia: Secondary | ICD-10-CM | POA: Diagnosis not present

## 2022-04-17 DIAGNOSIS — R634 Abnormal weight loss: Secondary | ICD-10-CM | POA: Diagnosis not present

## 2022-04-17 DIAGNOSIS — Z452 Encounter for adjustment and management of vascular access device: Secondary | ICD-10-CM | POA: Diagnosis not present

## 2022-04-17 DIAGNOSIS — C3412 Malignant neoplasm of upper lobe, left bronchus or lung: Secondary | ICD-10-CM | POA: Diagnosis not present

## 2022-04-17 DIAGNOSIS — Z5189 Encounter for other specified aftercare: Secondary | ICD-10-CM | POA: Diagnosis not present

## 2022-04-17 DIAGNOSIS — R197 Diarrhea, unspecified: Secondary | ICD-10-CM | POA: Diagnosis not present

## 2022-04-17 DIAGNOSIS — Z87891 Personal history of nicotine dependence: Secondary | ICD-10-CM | POA: Diagnosis not present

## 2022-04-17 DIAGNOSIS — D61810 Antineoplastic chemotherapy induced pancytopenia: Secondary | ICD-10-CM | POA: Diagnosis not present

## 2022-04-17 DIAGNOSIS — Z902 Acquired absence of lung [part of]: Secondary | ICD-10-CM | POA: Diagnosis not present

## 2022-04-17 DIAGNOSIS — T451X5A Adverse effect of antineoplastic and immunosuppressive drugs, initial encounter: Secondary | ICD-10-CM | POA: Diagnosis not present

## 2022-04-17 DIAGNOSIS — Z7952 Long term (current) use of systemic steroids: Secondary | ICD-10-CM | POA: Diagnosis not present

## 2022-04-17 DIAGNOSIS — D6481 Anemia due to antineoplastic chemotherapy: Secondary | ICD-10-CM | POA: Diagnosis not present

## 2022-04-17 MED ORDER — SODIUM CHLORIDE 0.9 % IV SOLN
Freq: Once | INTRAVENOUS | Status: AC
  Filled 2022-04-17: qty 250

## 2022-04-17 MED ORDER — SODIUM CHLORIDE 0.9 % IV SOLN
75.0000 mg/m2 | Freq: Once | INTRAVENOUS | Status: AC
  Administered 2022-04-17: 150 mg via INTRAVENOUS
  Filled 2022-04-17: qty 150

## 2022-04-17 MED ORDER — SODIUM CHLORIDE 0.9 % IV SOLN
10.0000 mg | Freq: Once | INTRAVENOUS | Status: AC
  Administered 2022-04-17: 10 mg via INTRAVENOUS
  Filled 2022-04-17: qty 10

## 2022-04-17 MED ORDER — SODIUM CHLORIDE 0.9 % IV SOLN
150.0000 mg | Freq: Once | INTRAVENOUS | Status: AC
  Administered 2022-04-17: 150 mg via INTRAVENOUS
  Filled 2022-04-17: qty 150

## 2022-04-17 MED ORDER — PALONOSETRON HCL INJECTION 0.25 MG/5ML
0.2500 mg | Freq: Once | INTRAVENOUS | Status: AC
  Administered 2022-04-17: 0.25 mg via INTRAVENOUS
  Filled 2022-04-17: qty 5

## 2022-04-17 MED ORDER — SODIUM CHLORIDE 0.9% FLUSH
10.0000 mL | INTRAVENOUS | Status: DC | PRN
  Administered 2022-04-17 (×2): 10 mL
  Filled 2022-04-17: qty 10

## 2022-04-17 MED ORDER — HEPARIN SOD (PORK) LOCK FLUSH 100 UNIT/ML IV SOLN
INTRAVENOUS | Status: AC
  Filled 2022-04-17: qty 5

## 2022-04-17 MED ORDER — HEPARIN SOD (PORK) LOCK FLUSH 100 UNIT/ML IV SOLN
500.0000 [IU] | Freq: Once | INTRAVENOUS | Status: AC | PRN
  Administered 2022-04-17: 500 [IU]
  Filled 2022-04-17: qty 5

## 2022-04-17 MED ORDER — SODIUM CHLORIDE 0.9 % IV SOLN
75.0000 mg/m2 | Freq: Once | INTRAVENOUS | Status: AC
  Administered 2022-04-17: 142 mg via INTRAVENOUS
  Filled 2022-04-17: qty 14.2

## 2022-04-17 MED ORDER — MAGNESIUM SULFATE 2 GM/50ML IV SOLN
2.0000 g | Freq: Once | INTRAVENOUS | Status: AC
  Administered 2022-04-17: 2 g via INTRAVENOUS
  Filled 2022-04-17: qty 50

## 2022-04-17 MED ORDER — POTASSIUM CHLORIDE IN NACL 20-0.9 MEQ/L-% IV SOLN
Freq: Once | INTRAVENOUS | Status: AC
  Filled 2022-04-17: qty 1000

## 2022-04-17 NOTE — Patient Instructions (Signed)
[name] [name]  Discharge Instructions: Thank you for choosing [facility] to provide your oncology and hematology care.  If you have a lab appointment with the [facility], please go directly to the [facility] and check in at the registration area.  Wear comfortable clothing and clothing appropriate for easy access to any Portacath or PICC line.   We strive to give you quality time with your provider. You may need to reschedule your appointment if you arrive late (15 or more minutes).  Arriving late affects you and other patients whose appointments are after yours.  Also, if you miss three or more appointments without notifying the office, you may be dismissed from the clinic at the provider's discretion.      For prescription refill requests, have your pharmacy contact our office and allow 72 hours for refills to be completed.    Today you received the following chemotherapy and/or immunotherapy agents: CISplatin /DOCEtaxel    To help prevent nausea and vomiting after your treatment, we encourage you to take your nausea medication as directed.  BELOW ARE SYMPTOMS THAT SHOULD BE REPORTED IMMEDIATELY: *FEVER GREATER THAN 100.4 F (38 C) OR HIGHER *CHILLS OR SWEATING *NAUSEA AND VOMITING THAT IS NOT CONTROLLED WITH YOUR NAUSEA MEDICATION *UNUSUAL SHORTNESS OF BREATH *UNUSUAL BRUISING OR BLEEDING *URINARY PROBLEMS (pain or burning when urinating, or frequent urination) *BOWEL PROBLEMS (unusual diarrhea, constipation, pain near the anus) TENDERNESS IN MOUTH AND THROAT WITH OR WITHOUT PRESENCE OF ULCERS (sore throat, sores in mouth, or a toothache) UNUSUAL RASH, SWELLING OR PAIN  UNUSUAL VAGINAL DISCHARGE OR ITCHING   Items with * indicate a potential emergency and should be followed up as soon as possible or go to the Emergency Department if any problems should occur.  Please show the CHEMOTHERAPY ALERT CARD or IMMUNOTHERAPY ALERT CARD at  check-in to the Emergency Department and triage nurse.  Should you have questions after your visit or need to cancel or reschedule your appointment, please contact [facility]  [phone] and follow the prompts.  Office hours are 8:00 a.m. to 4:30 p.m. Monday - Friday. Please note that voicemails left after 4:00 p.m. may not be returned until the following business day.  We are closed weekends and major holidays. You have access to a nurse at all times for urgent questions. Please call the main number to the clinic [phone] and follow the prompts.  For any non-urgent questions, you may also contact your provider using MyChart. We now offer e-Visits for anyone 59 and older to request care online for non-urgent symptoms. For details visit [email or web address].   Also download the MyChart app! Go to the app store, search "MyChart", open the app, select [facility], and log in with your MyChart username and password.

## 2022-04-17 NOTE — Progress Notes (Signed)
Nutrition Follow-up:   Patient with lung cancer.  Patient on cisplatin and taxotere.  Met with patient during infusion.  Reports that this is her last chemo treatment.  Reports that her appetite is good, "too good."  Her nausea, diarrhea and vomiting have resolved.  She is drinking boost shakes 1-2 a day and including good sources of protein in her diet.     Medications: reviewed  Labs: reviewed  Anthropometrics:   Weight 183 lb today  180 lb on 1/25 175 lb on 1/10 UBW of 166-172 lb   NUTRITION DIAGNOSIS: Inadequate oral intake improved    INTERVENTION:  Patient to consume well balanced diet including good sources of protein Continue oral nutrition supplements    NEXT VISIT: no follow-up RD available as needed  Tommey Barret B. Zenia Resides, [place], [place] Registered Dietitian [phone]

## 2022-04-18 ENCOUNTER — Inpatient Hospital Stay: Payer: Medicare Other

## 2022-04-18 DIAGNOSIS — D6481 Anemia due to antineoplastic chemotherapy: Secondary | ICD-10-CM | POA: Diagnosis not present

## 2022-04-18 DIAGNOSIS — R197 Diarrhea, unspecified: Secondary | ICD-10-CM | POA: Diagnosis not present

## 2022-04-18 DIAGNOSIS — Z7952 Long term (current) use of systemic steroids: Secondary | ICD-10-CM | POA: Diagnosis not present

## 2022-04-18 DIAGNOSIS — D61810 Antineoplastic chemotherapy induced pancytopenia: Secondary | ICD-10-CM | POA: Diagnosis not present

## 2022-04-18 DIAGNOSIS — R112 Nausea with vomiting, unspecified: Secondary | ICD-10-CM | POA: Diagnosis not present

## 2022-04-18 DIAGNOSIS — C3412 Malignant neoplasm of upper lobe, left bronchus or lung: Secondary | ICD-10-CM | POA: Diagnosis not present

## 2022-04-18 DIAGNOSIS — R634 Abnormal weight loss: Secondary | ICD-10-CM | POA: Diagnosis not present

## 2022-04-18 DIAGNOSIS — Z902 Acquired absence of lung [part of]: Secondary | ICD-10-CM | POA: Diagnosis not present

## 2022-04-18 DIAGNOSIS — T451X5A Adverse effect of antineoplastic and immunosuppressive drugs, initial encounter: Secondary | ICD-10-CM | POA: Diagnosis not present

## 2022-04-18 DIAGNOSIS — Z452 Encounter for adjustment and management of vascular access device: Secondary | ICD-10-CM | POA: Diagnosis not present

## 2022-04-18 DIAGNOSIS — Z87891 Personal history of nicotine dependence: Secondary | ICD-10-CM | POA: Diagnosis not present

## 2022-04-18 DIAGNOSIS — Z5111 Encounter for antineoplastic chemotherapy: Secondary | ICD-10-CM | POA: Diagnosis not present

## 2022-04-18 DIAGNOSIS — Z5189 Encounter for other specified aftercare: Secondary | ICD-10-CM | POA: Diagnosis not present

## 2022-04-18 DIAGNOSIS — E871 Hypo-osmolality and hyponatremia: Secondary | ICD-10-CM | POA: Diagnosis not present

## 2022-04-18 MED ORDER — SODIUM CHLORIDE 0.9 % IV SOLN
Freq: Once | INTRAVENOUS | Status: AC
  Filled 2022-04-18: qty 250

## 2022-04-18 MED ORDER — HEPARIN SOD (PORK) LOCK FLUSH 100 UNIT/ML IV SOLN
500.0000 [IU] | Freq: Once | INTRAVENOUS | Status: AC | PRN
  Administered 2022-04-18: 500 [IU]
  Filled 2022-04-18: qty 5

## 2022-04-18 MED ORDER — PEGFILGRASTIM-CBQV 6 MG/0.6ML ~~LOC~~ SOSY
6.0000 mg | PREFILLED_SYRINGE | Freq: Once | SUBCUTANEOUS | Status: AC
  Administered 2022-04-18: 6 mg via SUBCUTANEOUS

## 2022-04-18 NOTE — Patient Instructions (Signed)
[facility]  Discharge Instructions: Thank you for choosing [facility] to provide your oncology and hematology care.  If you have a lab appointment with the [facility], please go directly to the [facility] and check in at the registration area.  Wear comfortable clothing and clothing appropriate for easy access to any Portacath or PICC line.   We strive to give you quality time with your provider. You may need to reschedule your appointment if you arrive late (15 or more minutes).  Arriving late affects you and other patients whose appointments are after yours.  Also, if you miss three or more appointments without notifying the office, you may be dismissed from the clinic at the provider's discretion.      For prescription refill requests, have your pharmacy contact our office and allow 72 hours for refills to be completed.    Today you received the following chemotherapy and/or immunotherapy agents UDENCYA and FLUIDS      To help prevent nausea and vomiting after your treatment, we encourage you to take your nausea medication as directed.  BELOW ARE SYMPTOMS THAT SHOULD BE REPORTED IMMEDIATELY: *FEVER GREATER THAN 100.4 F (38 C) OR HIGHER *CHILLS OR SWEATING *NAUSEA AND VOMITING THAT IS NOT CONTROLLED WITH YOUR NAUSEA MEDICATION *UNUSUAL SHORTNESS OF BREATH *UNUSUAL BRUISING OR BLEEDING *URINARY PROBLEMS (pain or burning when urinating, or frequent urination) *BOWEL PROBLEMS (unusual diarrhea, constipation, pain near the anus) TENDERNESS IN MOUTH AND THROAT WITH OR WITHOUT PRESENCE OF ULCERS (sore throat, sores in mouth, or a toothache) UNUSUAL RASH, SWELLING OR PAIN  UNUSUAL VAGINAL DISCHARGE OR ITCHING   Items with * indicate a potential emergency and should be followed up as soon as possible or go to the Emergency Department if any problems should occur.  Please show the CHEMOTHERAPY ALERT CARD or IMMUNOTHERAPY ALERT CARD at  check-in to the Emergency Department and triage nurse.  Should you have questions after your visit or need to cancel or reschedule your appointment, please contact [facility]  [phone] and follow the prompts.  Office hours are 8:00 a.m. to 4:30 p.m. Monday - Friday. Please note that voicemails left after 4:00 p.m. may not be returned until the following business day.  We are closed weekends and major holidays. You have access to a nurse at all times for urgent questions. Please call the main number to the clinic [phone] and follow the prompts.  For any non-urgent questions, you may also contact your provider using MyChart. We now offer e-Visits for anyone 37 and older to request care online for non-urgent symptoms. For details visit [email or web address].   Also download the MyChart app! Go to the app store, search "MyChart", open the app, select [facility], and log in with your MyChart username and password.  Pegfilgrastim Injection What is this medication? PEGFILGRASTIM (PEG fil gra stim) lowers the risk of infection in people who are receiving chemotherapy. It works by Building control surveyor make more white blood cells, which protects your body from infection. It may also be used to help people who have been exposed to high doses of radiation. This medicine may be used for other purposes; ask your health care provider or pharmacist if you have questions. COMMON BRAND NAME(S): [employer], Neulasta, Nyvepria, Stimufend, UDENYCA, Ziextenzo What should I tell my care team before I take this medication? They need to know if you have any of these conditions: Kidney disease Latex allergy  Ongoing radiation therapy Sickle cell disease Skin reactions to acrylic adhesives (On-Body Injector only) An unusual or allergic reaction to pegfilgrastim, filgrastim, other medications, foods, dyes, or preservatives Pregnant or trying to get  pregnant Breast-feeding How should I use this medication? This medication is for injection under the skin. If you get this medication at home, you will be taught how to prepare and give the pre-filled syringe or how to use the On-body Injector. Refer to the patient Instructions for Use for detailed instructions. Use exactly as directed. Tell your care team immediately if you suspect that the On-body Injector may not have performed as intended or if you suspect the use of the On-body Injector resulted in a missed or partial dose. It is important that you put your used needles and syringes in a special sharps container. Do not put them in a trash can. If you do not have a sharps container, call your pharmacist or care team to get one. Talk to your care team about the use of this medication in children. While this medication may be prescribed for selected conditions, precautions do apply. Overdosage: If you think you have taken too much of this medicine contact a poison control center or emergency room at once. NOTE: This medicine is only for you. Do not share this medicine with others. What if I miss a dose? It is important not to miss your dose. Call your care team if you miss your dose. If you miss a dose due to an On-body Injector failure or leakage, a new dose should be administered as soon as possible using a single prefilled syringe for manual use. What may interact with this medication? Interactions have not been studied. This list may not describe all possible interactions. Give your health care provider a list of all the medicines, herbs, non-prescription drugs, or dietary supplements you use. Also tell them if you smoke, drink alcohol, or use illegal drugs. Some items may interact with your medicine. What should I watch for while using this medication? Your condition will be monitored carefully while you are receiving this medication. You may need blood work done while you are taking this  medication. Talk to your care team about your risk of cancer. You may be more at risk for certain types of cancer if you take this medication. If you are going to need a MRI, CT scan, or other procedure, tell your care team that you are using this medication (On-Body Injector only). What side effects may I notice from receiving this medication? Side effects that you should report to your care team as soon as possible: Allergic reactions--skin rash, itching, hives, swelling of the face, lips, tongue, or throat Capillary leak syndrome--stomach or muscle pain, unusual weakness or fatigue, feeling faint or lightheaded, decrease in the amount of urine, swelling of the ankles, hands, or feet, trouble breathing High white blood cell level--fever, fatigue, trouble breathing, night sweats, change in vision, weight loss Inflammation of the aorta--fever, fatigue, back, chest, or stomach pain, severe headache Kidney injury (glomerulonephritis)--decrease in the amount of urine, red or dark brown urine, foamy or bubbly urine, swelling of the ankles, hands, or feet Shortness of breath or trouble breathing Spleen injury--pain in upper left stomach or shoulder Unusual bruising or bleeding Side effects that usually do not require medical attention (report to your care team if they continue or are bothersome): Bone pain Pain in the hands or feet This list may not describe all possible side effects. Call your doctor for  medical advice about side effects. You may report side effects to FDA at 1-800-FDA-1088. Where should I keep my medication? Keep out of the reach of children. If you are using this medication at home, you will be instructed on how to store it. Throw away any unused medication after the expiration date on the label. NOTE: This sheet is a summary. It may not cover all possible information. If you have questions about this medicine, talk to your doctor, pharmacist, or health care provider.  2023  Elsevier/Gold Standard (2020-09-06 00:00:00)

## 2022-04-23 ENCOUNTER — Inpatient Hospital Stay (HOSPITAL_BASED_OUTPATIENT_CLINIC_OR_DEPARTMENT_OTHER): Payer: Medicare Other | Admitting: Medical Oncology

## 2022-04-23 ENCOUNTER — Inpatient Hospital Stay: Payer: Medicare Other

## 2022-04-23 ENCOUNTER — Encounter: Payer: Self-pay | Admitting: Medical Oncology

## 2022-04-23 VITALS — BP 134/79 | HR 92 | Temp 96.9°F | Wt 176.0 lb

## 2022-04-23 DIAGNOSIS — C3412 Malignant neoplasm of upper lobe, left bronchus or lung: Secondary | ICD-10-CM | POA: Diagnosis not present

## 2022-04-23 DIAGNOSIS — Z902 Acquired absence of lung [part of]: Secondary | ICD-10-CM | POA: Diagnosis not present

## 2022-04-23 DIAGNOSIS — E860 Dehydration: Secondary | ICD-10-CM | POA: Diagnosis not present

## 2022-04-23 DIAGNOSIS — Z7952 Long term (current) use of systemic steroids: Secondary | ICD-10-CM | POA: Diagnosis not present

## 2022-04-23 DIAGNOSIS — R634 Abnormal weight loss: Secondary | ICD-10-CM | POA: Diagnosis not present

## 2022-04-23 DIAGNOSIS — T451X5A Adverse effect of antineoplastic and immunosuppressive drugs, initial encounter: Secondary | ICD-10-CM | POA: Diagnosis not present

## 2022-04-23 DIAGNOSIS — D696 Thrombocytopenia, unspecified: Secondary | ICD-10-CM | POA: Diagnosis not present

## 2022-04-23 DIAGNOSIS — Z5189 Encounter for other specified aftercare: Secondary | ICD-10-CM | POA: Diagnosis not present

## 2022-04-23 DIAGNOSIS — E871 Hypo-osmolality and hyponatremia: Secondary | ICD-10-CM | POA: Diagnosis not present

## 2022-04-23 DIAGNOSIS — D6481 Anemia due to antineoplastic chemotherapy: Secondary | ICD-10-CM | POA: Diagnosis not present

## 2022-04-23 DIAGNOSIS — K521 Toxic gastroenteritis and colitis: Secondary | ICD-10-CM

## 2022-04-23 DIAGNOSIS — R197 Diarrhea, unspecified: Secondary | ICD-10-CM | POA: Diagnosis not present

## 2022-04-23 DIAGNOSIS — R112 Nausea with vomiting, unspecified: Secondary | ICD-10-CM

## 2022-04-23 DIAGNOSIS — Z87891 Personal history of nicotine dependence: Secondary | ICD-10-CM | POA: Diagnosis not present

## 2022-04-23 DIAGNOSIS — D61810 Antineoplastic chemotherapy induced pancytopenia: Secondary | ICD-10-CM | POA: Diagnosis not present

## 2022-04-23 DIAGNOSIS — Z5111 Encounter for antineoplastic chemotherapy: Secondary | ICD-10-CM | POA: Diagnosis not present

## 2022-04-23 DIAGNOSIS — Z452 Encounter for adjustment and management of vascular access device: Secondary | ICD-10-CM | POA: Diagnosis not present

## 2022-04-23 LAB — CBC WITH DIFFERENTIAL (CANCER CENTER ONLY)
Abs Immature Granulocytes: 0.04 10*3/uL (ref 0.00–0.07)
Basophils Absolute: 0.1 10*3/uL (ref 0.0–0.1)
Basophils Relative: 1 %
Eosinophils Absolute: 0 10*3/uL (ref 0.0–0.5)
Eosinophils Relative: 0 %
HCT: 33.3 % — ABNORMAL LOW (ref 36.0–46.0)
Hemoglobin: 11.2 g/dL — ABNORMAL LOW (ref 12.0–15.0)
Immature Granulocytes: 1 %
Lymphocytes Relative: 24 %
Lymphs Abs: 1.1 10*3/uL (ref 0.7–4.0)
MCH: 30.9 pg (ref 26.0–34.0)
MCHC: 33.6 g/dL (ref 30.0–36.0)
MCV: 92 fL (ref 80.0–100.0)
Monocytes Absolute: 0.4 10*3/uL (ref 0.1–1.0)
Monocytes Relative: 8 %
Neutro Abs: 3.2 10*3/uL (ref 1.7–7.7)
Neutrophils Relative %: 66 %
Platelet Count: 141 10*3/uL — ABNORMAL LOW (ref 150–400)
RBC: 3.62 MIL/uL — ABNORMAL LOW (ref 3.87–5.11)
RDW: 17.3 % — ABNORMAL HIGH (ref 11.5–15.5)
Smear Review: NORMAL
WBC Count: 4.8 10*3/uL (ref 4.0–10.5)
nRBC: 0 % (ref 0.0–0.2)

## 2022-04-23 LAB — BASIC METABOLIC PANEL - CANCER CENTER ONLY
Anion gap: 7 (ref 5–15)
BUN: 15 mg/dL (ref 8–23)
CO2: 24 mmol/L (ref 22–32)
Calcium: 8.7 mg/dL — ABNORMAL LOW (ref 8.9–10.3)
Chloride: 100 mmol/L (ref 98–111)
Creatinine: 0.73 mg/dL (ref 0.44–1.00)
GFR, Estimated: >60 mL/min (ref >60)
Glucose, Bld: 112 mg/dL — ABNORMAL HIGH (ref 70–99)
Potassium: 4.4 mmol/L (ref 3.5–5.1)
Sodium: 131 mmol/L — ABNORMAL LOW (ref 135–145)

## 2022-04-23 LAB — MAGNESIUM: Magnesium: 1.7 mg/dL (ref 1.7–2.4)

## 2022-04-23 MED ORDER — SODIUM CHLORIDE 0.9 % IV SOLN
Freq: Once | INTRAVENOUS | Status: AC
  Filled 2022-04-23: qty 250

## 2022-04-23 MED ORDER — HEPARIN SOD (PORK) LOCK FLUSH 100 UNIT/ML IV SOLN
500.0000 [IU] | Freq: Once | INTRAVENOUS | Status: AC
  Administered 2022-04-23: 500 [IU] via INTRAVENOUS
  Filled 2022-04-23: qty 5

## 2022-04-23 MED ORDER — SODIUM CHLORIDE 0.9% FLUSH
10.0000 mL | Freq: Once | INTRAVENOUS | Status: AC
  Administered 2022-04-23: 10 mL via INTRAVENOUS
  Filled 2022-04-23: qty 10

## 2022-04-23 NOTE — Progress Notes (Signed)
Symptom Management [facility] at [facility] [phone] [phone]  Patient Care Team: [facility], [name], MD as PCP - General Telford Nab, RN as Oncology Nurse Navigator Cammie Sickle, MD as Consulting Physician (Internal Medicine)   Name of the patient: [name] [name]  [id number]  [birth year]   Oncological History: Stage II T2 N1 squamous cell carcinoma of the left lung  Current Treatment: adjuvant Cisplatin + Docetaxel s/p Cycle 4 04/17/2022 with Udencya support on 04/18/2022. Plan to complete adjuvant Atezo/Pembrolizumab x 1 year after   Date of visit: 04/23/22  Reason for Consult: [name] [name] is a 72 y.o. female who presents today for:  Nausea and Diarrhea: Secondary to chemotherapy. Has improved with IVF in the past. Occasionally has had to use Zyprexa. Today she reports that she is past the worst of the vomiting and diarrhea. She is excited to have completed her 4 cycles of chemotherapy. She has upcoming imaging planned.   She is now starting to eat and drink a bit more now that her vomiting and diarrhea has improved. Lost 7 pounds from loss of appetite, GI loss. Still a bit nauseated but improving like she normally does as of yesterday. No fevers, SOB, bleeding or bruising.   Denies any neurologic complaints. Denies recent fevers or illnesses. Denies any easy bleeding or bruising. Denies chest pain. Denies urinary complaints. Patient offers no further specific complaints today.  Wt Readings from Last 3 Encounters:  04/23/22 176 lb (79.8 kg)  04/16/22 183 lb (83 kg)  04/16/22 183 lb 12.8 oz (83.4 kg)     PAST MEDICAL HISTORY: Past Medical History:  Diagnosis Date   Arthritis    OA   Carpal tunnel syndrome    Depression    Endometriosis    Fibromyalgia    Hyperlipidemia    Left upper lobe pulmonary nodule 11/2021   Post herpetic neuralgia    Sleep disorder    Tobacco abuse     PAST SURGICAL HISTORY:   Past Surgical History:  Procedure Laterality Date   ABDOMINAL HYSTERECTOMY  1992   total endometriosis   APPENDECTOMY  1992   bladder tack  1992   CATARACT EXTRACTION W/PHACO Right 03/25/2018   Procedure: CATARACT EXTRACTION PHACO AND INTRAOCULAR LENS PLACEMENT ([place]) RIGHT;  Surgeon: Marchia Meiers, MD;  Location: [facility];  Service: Ophthalmology;  Laterality: Right;  [place]  01:11 CDE 8.02 Fluid pack lot # [id number]   CATARACT EXTRACTION W/PHACO Left 04/22/2018   Procedure: CATARACT EXTRACTION PHACO AND INTRAOCULAR LENS PLACEMENT ([place]) Left Eye;  Surgeon: Marchia Meiers, MD;  Location: [facility];  Service: Ophthalmology;  Laterality: Left;  [place]  01:02 CDE 8.11 Fluid pack lot # [id number]   COLONOSCOPY     FOOT SURGERY Right 04/2016   [place] in [place] great toe; straightening others   INTERCOSTAL NERVE BLOCK Left 12/12/2021   Procedure: INTERCOSTAL NERVE BLOCK;  Surgeon: [name], MD;  Location: [facility];  Service: Thoracic;  Laterality: Left;   IR IMAGING GUIDED PORT INSERTION  02/27/2022   LYMPH NODE DISSECTION Left 12/12/2021   Procedure: LYMPH NODE DISSECTION;  Surgeon: [name], MD;  Location: [facility];  Service: Thoracic;  Laterality: Left;   VIDEO BRONCHOSCOPY N/A 12/16/2021   Procedure: VIDEO BRONCHOSCOPY;  Surgeon: [name], MD;  Location: [facility] OR;  Service: Thoracic;  Laterality: N/A;    HEMATOLOGY/ONCOLOGY HISTORY:  Oncology History Overview Note  JULY 2023- 1. 10 mm mean diameter  nodule in the left upper lobe adjacent to an area of pleural and parenchymal scarring.     SEP 2023- 1. Hypermetabolic spiculated nodular left upper lobe pulmonary lesion is compatible with primary bronchogenic neoplasm. 2. Mildly metabolic nodularity in the left hilum, without discrete adenopathy may reflect early nodal disease involvement. Suggest attention on follow-up imaging. 3. No evidence of distant hypermetabolic metastatic disease. 4.  Hypermetabolic nodule in the left lobe of the thyroid, Recommend thyroid US and biopsy (ref: J Am Coll Radiol. 2015 Feb;12(2): 143-50). 5. Aortic Atherosclerosis (ICD10-I70.0) and Emphysema (ICD10-J43.9).  # LUNG: Resection  Synchronous Tumors: Not applicable Total Number of Primary Tumors: 1 Procedure: Lobectomy, lung Specimen Laterality: Left Tumor Focality: Unifocal Tumor Site: Upper lobe Tumor Size: 2.6 cm Histologic Type: Squamous cell carcinoma, moderately differentiated Visceral Pleura Invasion: Not identified Direct Invasion of Adjacent Structures: No adjacent structures present Lymphovascular Invasion: Not identified Margins: All margins negative for invasive carcinoma      Closest Margin(s) to Invasive Carcinoma: Bronchovascular margin      Margin(s) Involved by Invasive Carcinoma: Not applicable       Margin Status for Non-Invasive Tumor: Negative Treatment Effect: No known presurgical therapy Regional Lymph Nodes:      Number of Lymph Nodes Involved: 1                           Nodal Sites with Tumor: Level 10      Number of Lymph Nodes Examined: 21                      Nodal Sites Examined: Levels 4, 5, 7, 9, 10, 11, 12 and 13 Distant Metastasis:      Distant Site(s) Involved: Not applicable Pathologic Stage Classification (pTNM, AJCC 8th Edition): pT1c, pN1 Ancillary Studies: Can be performed upon request Representative Tumor Block: B5 Comment(s): None  # DEC 14th, 2023- Cisplatin-Taxotere #1; D-2 [name] x4 cycles    Lung cancer ([place])  12/05/2021 Initial Diagnosis   Lung cancer ([place])   12/18/2021 Cancer Staging   Staging form: Lung, AJCC 8th Edition - Pathologic stage from 12/18/2021: Stage IIB (pT1c, pN1, cM0) - Signed by [name], MD on 12/18/2021 Histopathologic type: Squamous cell carcinoma, NOS   Cancer of upper lobe of left lung ([place])  01/03/2022 Initial Diagnosis   Cancer of upper lobe of left lung ([place])   01/03/2022 Cancer Staging    Staging form: Lung, AJCC 8th Edition - Pathologic: Stage IIB (pT1c, pN1, cM0) - Signed by Cammie Sickle, MD on 01/03/2022   02/13/2022 -  Chemotherapy   Patient is on Treatment Plan : LUNG Cisplatin + Docetaxel q21d       ALLERGIES:  has No Known Allergies.  MEDICATIONS:  Current Outpatient Medications  Medication Sig Dispense Refill   acetaminophen (TYLENOL) 500 MG tablet Take 500 mg by mouth every 6 (six) hours as needed.     aspirin 81 MG tablet Take 81 mg by mouth daily.     busPIRone (BUSPAR) 15 MG tablet TAKE 1/2 TABLET BY MOUTH TWICE A DAY 90 tablet 1   Cobalamin Combinations (NEURIVA PLUS PO) Take by mouth.     dexamethasone (DECADRON) 4 MG tablet Take 1 tablet (4 mg total) by mouth 2 (two) times daily with a meal. Start 2 days prior to chemo. Take for 2 days; DO NOT take on the day of chemo. 60 tablet 0   docusate sodium (COLACE)  100 MG capsule Take 200 mg by mouth daily.     DULoxetine (CYMBALTA) 60 MG capsule TAKE 1 CAPSULE BY MOUTH EVERY DAY 90 capsule 1   Fluticasone-Umeclidin-Vilant (TRELEGY ELLIPTA) 100-62.5-25 MCG/ACT AEPB Inhale 1 puff into the lungs daily. 28 each 11   lidocaine-prilocaine (EMLA) cream Apply on the port. 30 -45 min  prior to port access. 30 g 3   MAGNESIUM CITRATE PO Take by mouth daily.     Melatonin 10 MG CAPS Take 20 mg by mouth at bedtime.     ondansetron (ZOFRAN) 8 MG tablet One pill every 8 hours as needed for nausea/vomitting. 40 tablet 1   pregabalin (LYRICA) 75 MG capsule TAKE 1 CAPSULE BY MOUTH TWICE A DAY 180 capsule 1   prochlorperazine (COMPAZINE) 10 MG tablet Take 1 tablet (10 mg total) by mouth every 6 (six) hours as needed for nausea or vomiting. 40 tablet 1   traMADol (ULTRAM) 50 MG tablet TAKE 1 TABLET BY MOUTH EVERY 8 HOURS AS NEEDED. 90 tablet 0   magic mouthwash (nystatin, lidocaine, diphenhydrAMINE, alum & mag hydroxide) suspension Swish and spit 5 mLs 4 (four) times daily as needed for mouth pain. (Patient not taking:  Reported on 03/26/2022) 240 mL 1   naproxen sodium (ALEVE) 220 MG tablet Take 440 mg by mouth daily as needed (headaches). (Patient not taking: Reported on 03/26/2022)     nystatin (MYCOSTATIN) 100000 UNIT/ML suspension Take 5 mLs (500,000 Units total) by mouth 4 (four) times daily. (Patient not taking: Reported on 04/16/2022) 60 mL 0   senna (SENOKOT) 8.6 MG tablet Take 1 tablet (8.6 mg total) by mouth 2 (two) times daily as needed for constipation. (Patient not taking: Reported on 04/16/2022) 14 tablet 0   No current facility-administered medications for this visit.   Facility-Administered Medications Ordered in Other Visits  Medication Dose Route Frequency Provider Last Rate Last Admin   0.9 %  sodium chloride infusion   Intravenous Continuous Hughie Closs, PA-C   Stopped at 02/17/22 1214   sodium chloride flush (NS) 0.9 % injection 10 mL  10 mL Intravenous Once Verlon Au, NP        VITAL SIGNS: BP 134/79 (BP Location: Left Arm, Patient Position: Sitting)   Pulse 92   Temp (!) 96.9 F (36.1 C) (Tympanic)   Wt 176 lb (79.8 kg)   SpO2 98%   BMI 29.29 kg/m  Filed Weights   04/23/22 1017  Weight: 176 lb (79.8 kg)    Estimated body mass index is 29.29 kg/m as calculated from the following:   Height as of 04/16/22: 5\' 5"  (1.651 m).   Weight as of this encounter: 176 lb (79.8 kg).  LABS: CBC:    Component Value Date/Time   WBC 4.8 04/23/2022 0959   WBC 10.2 04/16/2022 0826   HGB 11.2 (L) 04/23/2022 0959   HCT 33.3 (L) 04/23/2022 0959   PLT 141 (L) 04/23/2022 0959   MCV 92.0 04/23/2022 0959   NEUTROABS 3.2 04/23/2022 0959   LYMPHSABS 1.1 04/23/2022 0959   MONOABS 0.4 04/23/2022 0959   EOSABS 0.0 04/23/2022 0959   BASOSABS 0.1 04/23/2022 0959   Comprehensive Metabolic Panel:    Component Value Date/Time   NA 131 (L) 04/23/2022 0959   K 4.4 04/23/2022 0959   CL 100 04/23/2022 0959   CO2 24 04/23/2022 0959   BUN 15 04/23/2022 0959   CREATININE 0.73 04/23/2022  0959   GLUCOSE 112 (H) 04/23/2022 0959   CALCIUM  8.7 (L) 04/23/2022 0959   AST 18 04/16/2022 0826   ALT 12 04/16/2022 0826   ALKPHOS 99 04/16/2022 0826   BILITOT 0.3 04/16/2022 0826   PROT 6.5 04/16/2022 0826   ALBUMIN 3.5 04/16/2022 0826    RADIOGRAPHIC STUDIES: DG Chest 2 View  Result Date: 03/25/2022 CLINICAL DATA:  Lung nodule. History of lymph node dissection 12/13/2019. Some shortness of breath. History of left upper lobectomy or primary lung cancer. EXAM: CHEST - 2 VIEW COMPARISON:  Chest two views 03/16/2021, 12/24/2021, 12/21/2021; CT chest 12/15/2021 FINDINGS: Right chest wall porta catheter tip overlies the central superior vena cava, new from 01/14/2022. This was placed 02/27/2022, seen on intra procedural fluoroscopy images on PACS. Mild calcification within the aortic arch. The visualized cardiac silhouette is unremarkable. Slight improvement in mild-to-moderate left pleural effusion. The right lung is clear. No pneumothorax is seen. Mild-to-moderate multilevel degenerative disc changes of the thoracic spine. IMPRESSION: Right chest wall porta catheter tip overlies the central superior vena cava, new from 01/14/2022. Slight improvement in mild-to-moderate left pleural effusion. Electronically Signed   By: Yvonne Kendall M.D.   On: 03/25/2022 11:34    PERFORMANCE STATUS (ECOG) : 2 - Symptomatic, <50% confined to bed  Review of Systems Unless otherwise noted, a complete review of systems is negative.  Physical Exam General: NAD Cardiovascular: regular rate and rhythm Pulmonary: clear ant fields Abdomen: soft, nontender, + bowel sounds GU: no suprapubic tenderness Extremities: no edema, no joint deformities Skin: no rashes Neurological: Weakness but otherwise nonfocal  Assessment and Plan- Patient is a 72 y.o. female    Encounter Diagnoses  Name Primary?   Cancer of upper lobe of left lung ([place]) Yes   Chemotherapy induced nausea and vomiting    Dehydration      Diarrhea: Secondary to chemotherapy. Has resolved since recent chemo. Imodium as needed. She will alert me if this returns.   Nausea/vomiting: Secondary to chemotherapy. Vomiting has resolved. Appetite is starting to return as it has with other cycles. Should continue to improve with time. She will alert me if it does not.   Chemotherapy induced anemia: Chronic and improving. Expected to improve the further out from completing chemotherapy she is.   Thrombocytopenia: Chronic and chemotherapy induced. Not critical and without bleeding/bruising episodes. Should improve with time as chemotherapy has ended.   Hyponatremia: Chronic secondary to GI loss from chemotherapy side effects. Improved with IVF in the past and improves further out from treatment days so not likely SIADH. 1L IVF today  Hypocalcemia: Mild. Likely secondary to GI loss from chemotherapy. Expected to improve with oral intake.   Disposition: 1L IVF today No new [facility] appointment needed- has appointment with Dr Rogue Bussing on 05/21/2022 to start immunotherapy.   Patient expressed understanding and was in agreement with this plan. She also understands that She can call clinic at any time with any questions, concerns, or complaints.   Thank you for allowing me to participate in the care of this very pleasant patient.   Time Total: 25  Visit consisted of counseling and education dealing with the complex and emotionally intense issues of symptom management in the setting of serious illness.Greater than 50%  of this time was spent counseling and coordinating care related to the above assessment and plan.  Signed by: Nelwyn Salisbury, PA-C

## 2022-04-24 ENCOUNTER — Telehealth: Payer: Self-pay | Admitting: Internal Medicine

## 2022-04-24 NOTE — Telephone Encounter (Signed)
OK to schedule IVF if appointment available.  Was seen by Judson Roch yesterday.

## 2022-04-24 NOTE — Telephone Encounter (Signed)
Daughter called requested for her mom ([name] [name]) to have IVF tomorrow. Stated that she had them yesterday and they helped a little, but she wants more. Told her I would reach out to Dr. Sharmaine [facility] team. Please advise.

## 2022-04-25 ENCOUNTER — Inpatient Hospital Stay: Payer: Medicare Other

## 2022-04-25 VITALS — BP 125/64 | HR 93 | Temp 98.7°F | Resp 20

## 2022-04-25 DIAGNOSIS — Z452 Encounter for adjustment and management of vascular access device: Secondary | ICD-10-CM | POA: Diagnosis not present

## 2022-04-25 DIAGNOSIS — Z902 Acquired absence of lung [part of]: Secondary | ICD-10-CM | POA: Diagnosis not present

## 2022-04-25 DIAGNOSIS — C3412 Malignant neoplasm of upper lobe, left bronchus or lung: Secondary | ICD-10-CM

## 2022-04-25 DIAGNOSIS — E871 Hypo-osmolality and hyponatremia: Secondary | ICD-10-CM | POA: Diagnosis not present

## 2022-04-25 DIAGNOSIS — D6481 Anemia due to antineoplastic chemotherapy: Secondary | ICD-10-CM | POA: Diagnosis not present

## 2022-04-25 DIAGNOSIS — R197 Diarrhea, unspecified: Secondary | ICD-10-CM | POA: Diagnosis not present

## 2022-04-25 DIAGNOSIS — Z87891 Personal history of nicotine dependence: Secondary | ICD-10-CM | POA: Diagnosis not present

## 2022-04-25 DIAGNOSIS — T451X5A Adverse effect of antineoplastic and immunosuppressive drugs, initial encounter: Secondary | ICD-10-CM | POA: Diagnosis not present

## 2022-04-25 DIAGNOSIS — R634 Abnormal weight loss: Secondary | ICD-10-CM | POA: Diagnosis not present

## 2022-04-25 DIAGNOSIS — Z7952 Long term (current) use of systemic steroids: Secondary | ICD-10-CM | POA: Diagnosis not present

## 2022-04-25 DIAGNOSIS — D61810 Antineoplastic chemotherapy induced pancytopenia: Secondary | ICD-10-CM | POA: Diagnosis not present

## 2022-04-25 DIAGNOSIS — R112 Nausea with vomiting, unspecified: Secondary | ICD-10-CM | POA: Diagnosis not present

## 2022-04-25 DIAGNOSIS — Z5189 Encounter for other specified aftercare: Secondary | ICD-10-CM | POA: Diagnosis not present

## 2022-04-25 DIAGNOSIS — Z5111 Encounter for antineoplastic chemotherapy: Secondary | ICD-10-CM | POA: Diagnosis not present

## 2022-04-25 MED ORDER — HEPARIN SOD (PORK) LOCK FLUSH 100 UNIT/ML IV SOLN
500.0000 [IU] | Freq: Once | INTRAVENOUS | Status: AC | PRN
  Administered 2022-04-25: 500 [IU]
  Filled 2022-04-25: qty 5

## 2022-04-25 MED ORDER — SODIUM CHLORIDE 0.9 % IV SOLN
Freq: Once | INTRAVENOUS | Status: AC
  Filled 2022-04-25: qty 250

## 2022-04-25 MED ORDER — SODIUM CHLORIDE 0.9% FLUSH
10.0000 mL | Freq: Once | INTRAVENOUS | Status: AC | PRN
  Administered 2022-04-25: 10 mL
  Filled 2022-04-25: qty 10

## 2022-05-07 ENCOUNTER — Ambulatory Visit
Admission: RE | Admit: 2022-05-07 | Discharge: 2022-05-07 | Disposition: A | Discharge status: Home / Self Care | Payer: Medicare Other | Source: Ambulatory Visit | Attending: Internal Medicine | Admitting: Internal Medicine

## 2022-05-07 DIAGNOSIS — C3490 Malignant neoplasm of unspecified part of unspecified bronchus or lung: Secondary | ICD-10-CM | POA: Diagnosis not present

## 2022-05-07 DIAGNOSIS — J90 Pleural effusion, not elsewhere classified: Secondary | ICD-10-CM | POA: Diagnosis not present

## 2022-05-07 DIAGNOSIS — C3412 Malignant neoplasm of upper lobe, left bronchus or lung: Secondary | ICD-10-CM | POA: Diagnosis not present

## 2022-05-07 MED ORDER — IOHEXOL 300 MG/ML  SOLN
75.0000 mL | Freq: Once | INTRAMUSCULAR | Status: AC | PRN
  Administered 2022-05-07: 75 mL via INTRAVENOUS

## 2022-05-12 ENCOUNTER — Ambulatory Visit: Payer: Medicare Other | Admitting: Internal Medicine

## 2022-05-12 ENCOUNTER — Other Ambulatory Visit: Payer: Medicare Other

## 2022-05-18 ENCOUNTER — Encounter: Payer: Self-pay | Admitting: Internal Medicine

## 2022-05-21 ENCOUNTER — Encounter: Payer: Self-pay | Admitting: Internal Medicine

## 2022-05-21 ENCOUNTER — Inpatient Hospital Stay: Payer: Medicare Other | Attending: Internal Medicine

## 2022-05-21 ENCOUNTER — Inpatient Hospital Stay (HOSPITAL_BASED_OUTPATIENT_CLINIC_OR_DEPARTMENT_OTHER): Payer: Medicare Other | Admitting: Internal Medicine

## 2022-05-21 VITALS — BP 156/81 | HR 92 | Temp 97.2°F | Resp 20 | Wt 177.7 lb

## 2022-05-21 DIAGNOSIS — R112 Nausea with vomiting, unspecified: Secondary | ICD-10-CM | POA: Insufficient documentation

## 2022-05-21 DIAGNOSIS — C3412 Malignant neoplasm of upper lobe, left bronchus or lung: Secondary | ICD-10-CM | POA: Diagnosis not present

## 2022-05-21 DIAGNOSIS — Z902 Acquired absence of lung [part of]: Secondary | ICD-10-CM | POA: Diagnosis not present

## 2022-05-21 DIAGNOSIS — R5383 Other fatigue: Secondary | ICD-10-CM | POA: Insufficient documentation

## 2022-05-21 DIAGNOSIS — I7122 Aneurysm of the aortic arch, without rupture: Secondary | ICD-10-CM | POA: Insufficient documentation

## 2022-05-21 DIAGNOSIS — Z7952 Long term (current) use of systemic steroids: Secondary | ICD-10-CM | POA: Insufficient documentation

## 2022-05-21 DIAGNOSIS — Z95828 Presence of other vascular implants and grafts: Secondary | ICD-10-CM

## 2022-05-21 DIAGNOSIS — Z87891 Personal history of nicotine dependence: Secondary | ICD-10-CM | POA: Diagnosis not present

## 2022-05-21 DIAGNOSIS — R197 Diarrhea, unspecified: Secondary | ICD-10-CM | POA: Diagnosis not present

## 2022-05-21 LAB — CBC WITH DIFFERENTIAL (CANCER CENTER ONLY)
Abs Immature Granulocytes: 0.01 10*3/uL (ref 0.00–0.07)
Basophils Absolute: 0 10*3/uL (ref 0.0–0.1)
Basophils Relative: 0 %
Eosinophils Absolute: 0.2 10*3/uL (ref 0.0–0.5)
Eosinophils Relative: 5 %
HCT: 34.9 % — ABNORMAL LOW (ref 36.0–46.0)
Hemoglobin: 11.3 g/dL — ABNORMAL LOW (ref 12.0–15.0)
Immature Granulocytes: 0 %
Lymphocytes Relative: 28 %
Lymphs Abs: 1.3 10*3/uL (ref 0.7–4.0)
MCH: 31.1 pg (ref 26.0–34.0)
MCHC: 32.4 g/dL (ref 30.0–36.0)
MCV: 96.1 fL (ref 80.0–100.0)
Monocytes Absolute: 0.4 10*3/uL (ref 0.1–1.0)
Monocytes Relative: 8 %
Neutro Abs: 2.7 10*3/uL (ref 1.7–7.7)
Neutrophils Relative %: 59 %
Platelet Count: 195 10*3/uL (ref 150–400)
RBC: 3.63 MIL/uL — ABNORMAL LOW (ref 3.87–5.11)
RDW: 14.5 % (ref 11.5–15.5)
WBC Count: 4.6 10*3/uL (ref 4.0–10.5)
nRBC: 0 % (ref 0.0–0.2)

## 2022-05-21 LAB — CMP (CANCER CENTER ONLY)
ALT: 9 U/L (ref 0–44)
AST: 18 U/L (ref 15–41)
Albumin: 3.6 g/dL (ref 3.5–5.0)
Alkaline Phosphatase: 103 U/L (ref 38–126)
Anion gap: 6 (ref 5–15)
BUN: 11 mg/dL (ref 8–23)
CO2: 24 mmol/L (ref 22–32)
Calcium: 9.3 mg/dL (ref 8.9–10.3)
Chloride: 104 mmol/L (ref 98–111)
Creatinine: 0.73 mg/dL (ref 0.44–1.00)
GFR, Estimated: >60 mL/min (ref >60)
Glucose, Bld: 130 mg/dL — ABNORMAL HIGH (ref 70–99)
Potassium: 4.1 mmol/L (ref 3.5–5.1)
Sodium: 134 mmol/L — ABNORMAL LOW (ref 135–145)
Total Bilirubin: 0.6 mg/dL (ref 0.3–1.2)
Total Protein: 6.6 g/dL (ref 6.5–8.1)

## 2022-05-21 LAB — MAGNESIUM: Magnesium: 1.9 mg/dL (ref 1.7–2.4)

## 2022-05-21 MED ORDER — SODIUM CHLORIDE 0.9% FLUSH
10.0000 mL | Freq: Once | INTRAVENOUS | Status: AC
  Administered 2022-05-21: 10 mL via INTRAVENOUS
  Filled 2022-05-21: qty 10

## 2022-05-21 MED ORDER — HEPARIN SOD (PORK) LOCK FLUSH 100 UNIT/ML IV SOLN
500.0000 [IU] | Freq: Once | INTRAVENOUS | Status: AC
  Administered 2022-05-21: 500 [IU] via INTRAVENOUS
  Filled 2022-05-21: qty 5

## 2022-05-21 NOTE — Progress Notes (Signed)
DISCONTINUE ON PATHWAY REGIMEN - Non-Small Cell Lung     A cycle is every 21 days:     Docetaxel      Cisplatin   **Always confirm dose/schedule in your pharmacy ordering system**  REASON: Other Reason PRIOR TREATMENT: LOS1: Cisplatin 75 mg/m2 + Docetaxel 75 mg/m2 q21 Days x 4 Cycles TREATMENT RESPONSE: N/A - Adjuvant Therapy  START ON PATHWAY REGIMEN - Non-Small Cell Lung     A cycle is every 21 days:     Docetaxel      Cisplatin   **Always confirm dose/schedule in your pharmacy ordering system**  Patient Characteristics: Postoperative without Neoadjuvant Therapy (Pathologic Staging), Stage II-III, Adjuvant Chemotherapy, Stage IIB, Squamous Cell Therapeutic Status: Postoperative without Neoadjuvant Therapy (Pathologic Staging) AJCC T Category: pT1c AJCC N Category: pN1 AJCC M Category: cM0 AJCC 8 Stage Grouping: IIB Adjuvant Chemotherapy Status: Adjuvant Chemotherapy ALK Rearrangement Status: Negative EGFR Mutation Status: Negative/Wild Type Histology: Squamous Cell Intent of Therapy: Curative Intent, Discussed with Patient

## 2022-05-21 NOTE — Progress Notes (Signed)
Bottom of feet feel swollen with occasional pain.  Feeling fatigued.  Stable chronic SOBr

## 2022-05-21 NOTE — Progress Notes (Signed)
[facility] CONSULT NOTE  Patient Care Team: Tower, [name], MD as PCP - General Telford Nab, RN as Oncology Nurse Navigator Cammie Sickle, MD as Consulting Physician (Internal Medicine)  CHIEF COMPLAINTS/PURPOSE OF CONSULTATION: lung cancer  #  Oncology History Overview Note  JULY 2023- 1. 10 mm mean diameter nodule in the left upper lobe adjacent to an area of pleural and parenchymal scarring.     SEP 2023- 1. Hypermetabolic spiculated nodular left upper lobe pulmonary lesion is compatible with primary bronchogenic neoplasm. 2. Mildly metabolic nodularity in the left hilum, without discrete adenopathy may reflect early nodal disease involvement. Suggest attention on follow-up imaging. 3. No evidence of distant hypermetabolic metastatic disease. 4. Hypermetabolic nodule in the left lobe of the thyroid, Recommend thyroid US and biopsy (ref: J Am Coll Radiol. 2015 Feb;12(2): 143-50). 5. Aortic Atherosclerosis (ICD10-I70.0) and Emphysema (ICD10-J43.9).  # LUNG: Resection  Synchronous Tumors: Not applicable Total Number of Primary Tumors: 1 Procedure: Lobectomy, lung Specimen Laterality: Left Tumor Focality: Unifocal Tumor Site: Upper lobe Tumor Size: 2.6 cm Histologic Type: Squamous cell carcinoma, moderately differentiated Visceral Pleura Invasion: Not identified Direct Invasion of Adjacent Structures: No adjacent structures present Lymphovascular Invasion: Not identified Margins: All margins negative for invasive carcinoma      Closest Margin(s) to Invasive Carcinoma: Bronchovascular margin      Margin(s) Involved by Invasive Carcinoma: Not applicable       Margin Status for Non-Invasive Tumor: Negative Treatment Effect: No known presurgical therapy Regional Lymph Nodes:      Number of Lymph Nodes Involved: 1                           Nodal Sites with Tumor: Level 10      Number of Lymph Nodes Examined: 21                      Nodal Sites  Examined: Levels 4, 5, 7, 9, 10, 11, 12 and 13 Distant Metastasis:      Distant Site(s) Involved: Not applicable Pathologic Stage Classification (pTNM, AJCC 8th Edition): pT1c, pN1 Ancillary Studies: Can be performed upon request Representative Tumor Block: B5 Comment(s): None  # DEC 14th, 2023- Cisplatin-Taxotere #1; D-2 [name] x4 cycles [finished FEB 2024]  # April 15th, 2024- Keytruda q 3 W x1 year    Lung cancer ([place])  12/05/2021 Initial Diagnosis   Lung cancer ([place])   12/18/2021 Cancer Staging   Staging form: Lung, AJCC 8th Edition - Pathologic stage from 12/18/2021: Stage IIB (pT1c, pN1, cM0) - Signed by [name], MD on 12/18/2021 Histopathologic type: Squamous cell carcinoma, NOS   Cancer of upper lobe of left lung ([place])  01/03/2022 Initial Diagnosis   Cancer of upper lobe of left lung ([place])   01/03/2022 Cancer Staging   Staging form: Lung, AJCC 8th Edition - Pathologic: Stage IIB (pT1c, pN1, cM0) - Signed by Cammie Sickle, MD on 01/03/2022   02/13/2022 - 04/18/2022 Chemotherapy   Patient is on Treatment Plan : LUNG Cisplatin + Docetaxel q21d     06/16/2022 -  Chemotherapy   Patient is on Treatment Plan : LUNG NSCLC Pembrolizumab (200) q21d      HISTORY OF PRESENTING ILLNESS: Ambulating independently.  Accompanied by daughter.  [name] [name] 72 y.o.  female history of smoking with stage II T2 N1 squamous cell carcinoma left lung cancer currently on adjuvant chemotherapy with cisplatin and Taxotere  is here for follow-up.  In the interim patient was seen in the symptom management clinic for diarrhea nausea vomiting.  Received IV fluids.  Bottom of feet feel swollen with occasional pain.  Feeling fatigued.  Stable chronic SOBr  .Marland Kitchen Feeling much better today with no more nausea/vomiting and diarrhea.  Improved appetite with 13 lb wt gain. Currently  Doing well. Chemo tx is tomorrow.    Denies any significant leg swelling.  Denies any tingling or  numbness.  Denies any worsening pain.  Denies any worsening shortness of breath.  Review of Systems  Constitutional:  Negative for chills, diaphoresis, fever, malaise/fatigue and weight loss.  HENT:  Negative for nosebleeds and sore throat.   Eyes:  Negative for double vision.  Respiratory:  Negative for cough, hemoptysis, sputum production, shortness of breath and wheezing.   Cardiovascular:  Negative for chest pain, palpitations, orthopnea and leg swelling.  Gastrointestinal:  Negative for abdominal pain, blood in stool, constipation, diarrhea, heartburn, melena, nausea and vomiting.  Genitourinary:  Negative for dysuria, frequency and urgency.  Musculoskeletal:  Negative for back pain and joint pain.  Skin: Negative.  Negative for itching and rash.  Neurological:  Negative for dizziness, tingling, focal weakness, weakness and headaches.  Endo/Heme/Allergies:  Does not bruise/bleed easily.  Psychiatric/Behavioral:  Negative for depression. The patient is not nervous/anxious and does not have insomnia.      MEDICAL HISTORY:  Past Medical History:  Diagnosis Date   Arthritis    OA   Carpal tunnel syndrome    Depression    Endometriosis    Fibromyalgia    Hyperlipidemia    Left upper lobe pulmonary nodule 11/2021   Post herpetic neuralgia    Sleep disorder    Tobacco abuse     SURGICAL HISTORY: Past Surgical History:  Procedure Laterality Date   ABDOMINAL HYSTERECTOMY  1992   total endometriosis   APPENDECTOMY  1992   bladder tack  1992   CATARACT EXTRACTION W/PHACO Right 03/25/2018   Procedure: CATARACT EXTRACTION PHACO AND INTRAOCULAR LENS PLACEMENT ([place]) RIGHT;  Surgeon: Marchia Meiers, MD;  Location: [facility];  Service: Ophthalmology;  Laterality: Right;  [place]  01:11 CDE 8.02 Fluid pack lot # [id number]   CATARACT EXTRACTION W/PHACO Left 04/22/2018   Procedure: CATARACT EXTRACTION PHACO AND INTRAOCULAR LENS PLACEMENT ([place]) Left Eye;  Surgeon: Marchia Meiers, MD;  Location:  [facility];  Service: Ophthalmology;  Laterality: Left;  [place]  01:02 CDE 8.11 Fluid pack lot # [id number]   COLONOSCOPY     FOOT SURGERY Right 04/2016   [place] in [place] great toe; straightening others   INTERCOSTAL NERVE BLOCK Left 12/12/2021   Procedure: INTERCOSTAL NERVE BLOCK;  Surgeon: [name], MD;  Location: [facility];  Service: Thoracic;  Laterality: Left;   IR IMAGING GUIDED PORT INSERTION  02/27/2022   LYMPH NODE DISSECTION Left 12/12/2021   Procedure: LYMPH NODE DISSECTION;  Surgeon: [name], MD;  Location: [facility];  Service: Thoracic;  Laterality: Left;   VIDEO BRONCHOSCOPY N/A 12/16/2021   Procedure: VIDEO BRONCHOSCOPY;  Surgeon: [name], MD;  Location: [facility];  Service: Thoracic;  Laterality: N/A;    SOCIAL HISTORY: Social History   Socioeconomic History   Marital status: Widowed    Spouse name: Not on file   Number of children: 2   Years of education: Not on file   Highest education level: Not on file  Occupational History   Not on file  Tobacco Use  Smoking status: Former    Packs/day: 1.00    Years: 55.00    Additional pack years: 0.00    Total pack years: 55.00    Types: Cigarettes    Quit date: 12/03/2021    Years since quitting: 0.4   Smokeless tobacco: Never  Vaping Use   Vaping Use: Some days  Substance and Sexual Activity   Alcohol use: No    Alcohol/week: 0.0 standard drinks of alcohol   Drug use: No   Sexual activity: Not Currently  Other Topics Concern   Not on file  Social History Narrative   Lives alone   Social Determinants of Health   Financial Resource Strain: Low Risk  (04/16/2022)   Overall Financial Resource Strain (CARDIA)    Difficulty of Paying Living Expenses: Not hard at all  Food Insecurity: No Food Insecurity (04/16/2022)   Hunger Vital Sign    Worried About Running Out of Food in the Last Year: Never true    Ran Out of Food in the Last Year: Never true  Transportation Needs: No  Transportation Needs (04/16/2022)   PRAPARE - Hydrologist (Medical): No    Lack of Transportation (Non-Medical): No  Physical Activity: Inactive (04/16/2022)   Exercise Vital Sign    Days of Exercise per Week: 0 days    Minutes of Exercise per Session: 0 min  Stress: No Stress Concern Present (04/16/2022)   Novelty    Feeling of Stress : Not at all  Social Connections: Moderately Integrated (04/16/2022)   Social Connection and Isolation Panel [NHANES]    Frequency of Communication with Friends and Family: More than three times a week    Frequency of Social Gatherings with Friends and Family: More than three times a week    Attends Religious Services: More than 4 times per year    Active Member of [employer] or Organizations: Yes    Attends Archivist Meetings: More than 4 times per year    Marital Status: Widowed  Intimate Partner Violence: Not At Risk (04/16/2022)   Humiliation, Afraid, Rape, and Kick questionnaire    Fear of Current or Ex-Partner: No    Emotionally Abused: No    Physically Abused: No    Sexually Abused: No    FAMILY HISTORY: Family History  Problem Relation Age of Onset   Heart disease Mother        MI   Heart disease Father        MI   Arthritis Sister        crippling arthritis   Arthritis Brother        crippling arthritis    ALLERGIES:  has No Known Allergies.  MEDICATIONS:  Current Outpatient Medications  Medication Sig Dispense Refill   acetaminophen (TYLENOL) 500 MG tablet Take 500 mg by mouth every 6 (six) hours as needed.     aspirin 81 MG tablet Take 81 mg by mouth daily.     busPIRone (BUSPAR) 15 MG tablet TAKE 1/2 TABLET BY MOUTH TWICE A DAY 90 tablet 1   Cobalamin Combinations (NEURIVA PLUS PO) Take by mouth.     dexamethasone (DECADRON) 4 MG tablet Take 1 tablet (4 mg total) by mouth 2 (two) times daily with a meal. Start 2 days prior to  chemo. Take for 2 days; DO NOT take on the day of chemo. 60 tablet 0   docusate sodium (COLACE) 100 MG capsule  Take 200 mg by mouth daily.     DULoxetine (CYMBALTA) 60 MG capsule TAKE 1 CAPSULE BY MOUTH EVERY DAY 90 capsule 1   Fluticasone-Umeclidin-Vilant (TRELEGY ELLIPTA) 100-62.5-25 MCG/ACT AEPB Inhale 1 puff into the lungs daily. 28 each 11   lidocaine-prilocaine (EMLA) cream Apply on the port. 30 -45 min  prior to port access. 30 g 3   MAGNESIUM CITRATE PO Take by mouth daily.     Melatonin 10 MG CAPS Take 20 mg by mouth at bedtime.     ondansetron (ZOFRAN) 8 MG tablet One pill every 8 hours as needed for nausea/vomitting. 40 tablet 1   pregabalin (LYRICA) 75 MG capsule TAKE 1 CAPSULE BY MOUTH TWICE A DAY 180 capsule 1   prochlorperazine (COMPAZINE) 10 MG tablet Take 1 tablet (10 mg total) by mouth every 6 (six) hours as needed for nausea or vomiting. 40 tablet 1   traMADol (ULTRAM) 50 MG tablet TAKE 1 TABLET BY MOUTH EVERY 8 HOURS AS NEEDED. 90 tablet 0   magic mouthwash (nystatin, lidocaine, diphenhydrAMINE, alum & mag hydroxide) suspension Swish and spit 5 mLs 4 (four) times daily as needed for mouth pain. (Patient not taking: Reported on 03/26/2022) 240 mL 1   naproxen sodium (ALEVE) 220 MG tablet Take 440 mg by mouth daily as needed (headaches). (Patient not taking: Reported on 03/26/2022)     nystatin (MYCOSTATIN) 100000 UNIT/ML suspension Take 5 mLs (500,000 Units total) by mouth 4 (four) times daily. (Patient not taking: Reported on 04/16/2022) 60 mL 0   senna (SENOKOT) 8.6 MG tablet Take 1 tablet (8.6 mg total) by mouth 2 (two) times daily as needed for constipation. (Patient not taking: Reported on 04/16/2022) 14 tablet 0   No current facility-administered medications for this visit.   Facility-Administered Medications Ordered in Other Visits  Medication Dose Route Frequency Provider Last Rate Last Admin   0.9 %  sodium chloride infusion   Intravenous Continuous Hughie Closs,  PA-C   Stopped at 02/17/22 1214   sodium chloride flush (NS) 0.9 % injection 10 mL  10 mL Intravenous Once Verlon Au, NP          .  PHYSICAL EXAMINATION: ECOG PERFORMANCE STATUS: 0 - Asymptomatic  Vitals:   05/21/22 0900  BP: (!) 156/81  Pulse: 92  Resp: 20  Temp: (!) 97.2 F (36.2 C)  SpO2: 97%   Filed Weights   05/21/22 0900  Weight: 177 lb 11.2 oz (80.6 kg)    Physical Exam Vitals and nursing note reviewed.  Constitutional:      Comments:     HENT:     Head: Normocephalic and atraumatic.     Mouth/Throat:     Mouth: Mucous membranes are moist.     Pharynx: No oropharyngeal exudate.  Eyes:     Pupils: Pupils are equal, round, and reactive to light.  Cardiovascular:     Rate and Rhythm: Normal rate and regular rhythm.  Pulmonary:     Effort: No respiratory distress.     Breath sounds: No wheezing.     Comments: Decreased breath sounds bilaterally at bases.  No wheeze or crackles Abdominal:     General: Bowel sounds are normal. There is no distension.     Palpations: Abdomen is soft. There is no mass.     Tenderness: There is no abdominal tenderness. There is no guarding or rebound.  Musculoskeletal:        General: No tenderness. Normal range of motion.  Cervical back: Normal range of motion and neck supple.  Skin:    General: Skin is warm.  Neurological:     Mental Status: She is alert and oriented to person, place, and time.  Psychiatric:        Mood and Affect: Affect normal.        Judgment: Judgment normal.      LABORATORY DATA:  I have reviewed the data as listed Lab Results  Component Value Date   WBC 4.6 05/21/2022   HGB 11.3 (L) 05/21/2022   HCT 34.9 (L) 05/21/2022   MCV 96.1 05/21/2022   PLT 195 05/21/2022   Recent Labs    03/31/22 0951 04/02/22 0929 04/16/22 0826 04/23/22 0959 05/21/22 0921  NA 133*   < > 133* 131* 134*  K 3.1*   < > 4.3 4.4 4.1  CL 94*   < > 103 100 104  CO2 29   < > 23 24 24   GLUCOSE 129*   < >  123* 112* 130*  BUN 21   < > 14 15 11   CREATININE 0.76   < > 0.58 0.73 0.73  CALCIUM 9.1   < > 9.2 8.7* 9.3  GFRNONAA >60   < > >60 >60 >60  PROT 6.9  --  6.5  --  6.6  ALBUMIN 3.7  --  3.5  --  3.6  AST 23  --  18  --  18  ALT 19  --  12  --  9  ALKPHOS 128*  --  99  --  103  BILITOT 1.1  --  0.3  --  0.6   < > = values in this interval not displayed.    RADIOGRAPHIC STUDIES: I have personally reviewed the radiological images as listed and agreed with the findings in the report. CT CHEST W CONTRAST  Result Date: 05/08/2022 CLINICAL DATA:  Non-small-cell lung cancer. Treatment response. Previous left lobe resection and chemotherapy * Tracking Code: BO * EXAM: CT CHEST WITH CONTRAST TECHNIQUE: Multidetector CT imaging of the chest was performed during intravenous contrast administration. RADIATION DOSE REDUCTION: This exam was performed according to the departmental dose-optimization program which includes automated exposure control, adjustment of the mA and/or kV according to patient size and/or use of iterative reconstruction technique. CONTRAST:  49mL OMNIPAQUE IOHEXOL 300 MG/ML  SOLN COMPARISON:  Chest CT 12/15/2021. Older exams. Chest x-ray 03/25/2022 and older FINDINGS: Cardiovascular: Right upper chest port. Coronary artery calcifications are seen. Trace pericardial fluid. The heart is nonenlarged. The thoracic aorta has some scattered vascular calcifications. Small penetrating ulcer or saccular area of enlargement along the left side of the aortic arch. The outpouching measures 11 mm. Going back to the previous contrast study of 09/17/21, the amount of contrast enhancement with in the patchy is increasing in the size of the small saccular areas increasing slightly. There is some calcified plaque along the origin of the great vessels. Mediastinum/Nodes: No specific abnormal lymph node enlargement identified in the axillary regions, hilum or mediastinum. Slightly patulous thoracic esophagus.  Lungs/Pleura: Breathing motion seen in the right lung. No right-sided consolidation, pneumothorax or effusion. There is a small amount of debris along the right main bronchus. There is volume loss along the left hemithorax from previous left upper lobe resection. Small left pleural effusion is improving. The previous components of air in the pleural space improving. The left lung consolidation is improving as is the aeration of the lung. There is some minimal areas of bandlike  thickening along the left lung, likely scar and fibrotic change. No discrete new lung nodule at this time. Upper Abdomen: Elevated left hemidiaphragm is part of the volume loss. Adrenal glands are grossly preserved. Musculoskeletal: Scattered degenerative changes along the spine. IMPRESSION: Maturing posttreatment changes from left sided left upper lobe resection with volume loss. Small left effusion. No new developing mass lesion, lymph node enlargement. There is slight increase in saccular aneurysm focally along the lateral aspect of the aortic arch with more contrast enhancement within the defect. This is still small measuring 11 by 8 mm but clearly increased from the study of July 2023, postcontrast CT. Recommend continued short follow-up and surgical consultation when clinically appropriate Aortic Atherosclerosis (ICD10-I70.0). Electronically Signed   By: Jill Side M.D.   On: 05/08/2022 12:36    ASSESSMENT & PLAN:   Cancer of upper lobe of left lung ([place]) # Left upper lobe-lung cancer moderately differentiated squamous cell carcinoma-pT1pN1- [IIB]; PDL-1 =70%. EGFR status-WILD.  Adjuvant chemotherapy-cisplatin Taxotere x 4 cycles; followed by adjuvant Pembrolizumab x1 year.    # Currently s/p 4 cycles of Cis-Taxotere-CT scan March 2024 maturing posttreatment changes from left sided left upper lobe resection with volume loss. Small left effusion. No new developing mass lesion, lymph node enlargement.  # I long discussion with  the patient regarding proceeding with adjuvant therapy-Keytruda for total of 1 year.  I discussed the mechanism of action. Discussed the potential side effects of immunotherapy including but not limited to diarrhea; skin rash; elevated LFTs/endocrine abnormalities etc. given patient preference' and also help recover from side effects recommend starting approximately 1 month from now.  # PN G-1 -2; from Taxotere.  Monitor for now.   # IV Access: port-functional.  # Ongoing fatigue : From chemotherapy recommend evaluation with CARE program.   #Incidental findings on Imaging  CT , 2024: saccular aneurysm focally along the lateral aspect of the aortic arch- I reviewed/discussed/counseled the patient.  Will refer the patient to Dr. Lucky Cowboy vascular surgery for further recommendations.  pt requesting AM appts-  # DISPOSITION:  # refer to Dr.Dew re: thoracic aneurysm  # refer to CARE program # Follow up on 4/15 MD;port- labs- cbc/cmp;mag; TSH; Keytruda- Dr.B  # I reviewed the blood work- with the patient in detail; also reviewed the imaging independently [as summarized above]; and with the patient in detail.  Also discussed with the patient's daughter over the phone.   # 40 minutes face-to-face with the patient discussing the above plan of care; more than 50% of time spent on prognosis/ natural history; counseling and coordination.  All questions were answered. The patient knows to call the clinic with any problems, questions or concerns.    Cammie Sickle, MD 05/21/2022 12:04 PM

## 2022-05-21 NOTE — Assessment & Plan Note (Addendum)
#   Left upper lobe-lung cancer moderately differentiated squamous cell carcinoma-pT1pN1- [IIB]; PDL-1 =70%. EGFR status-WILD.  Adjuvant chemotherapy-cisplatin Taxotere x 4 cycles; followed by adjuvant Pembrolizumab x1 year.    # Currently s/p 4 cycles of Cis-Taxotere-CT scan March 2024 maturing posttreatment changes from left sided left upper lobe resection with volume loss. Small left effusion. No new developing mass lesion, lymph node enlargement.  # I long discussion with the patient regarding proceeding with adjuvant therapy-Keytruda for total of 1 year.  I discussed the mechanism of action. Discussed the potential side effects of immunotherapy including but not limited to diarrhea; skin rash; elevated LFTs/endocrine abnormalities etc. given patient preference' and also help recover from side effects recommend starting approximately 1 month from now.  # PN G-1 -2; from Taxotere.  Monitor for now.   # IV Access: port-functional.  # Ongoing fatigue : From chemotherapy recommend evaluation with CARE program.   #Incidental findings on Imaging  CT , 2024: saccular aneurysm focally along the lateral aspect of the aortic arch- I reviewed/discussed/counseled the patient.  Will refer the patient to Dr. Lucky Cowboy vascular surgery for further recommendations.  pt requesting AM appts-  # DISPOSITION:  # refer to Dr.Dew re: thoracic aneurysm  # refer to CARE program # Follow up on 4/15 MD;port- labs- cbc/cmp;mag; TSH; Keytruda- Dr.B  # I reviewed the blood work- with the patient in detail; also reviewed the imaging independently [as summarized above]; and with the patient in detail.  Also discussed with the patient's daughter over the phone.   # 40 minutes face-to-face with the patient discussing the above plan of care; more than 50% of time spent on prognosis/ natural history; counseling and coordination.

## 2022-05-23 ENCOUNTER — Other Ambulatory Visit: Payer: Self-pay | Admitting: Family Medicine

## 2022-05-23 MED ORDER — TRAMADOL HCL 50 MG PO TABS: 50.0000 mg | ORAL_TABLET | Freq: Three times a day (TID) | ORAL | 0 refills | Status: DC | PRN

## 2022-05-23 MED ORDER — BUSPIRONE HCL 15 MG PO TABS: 7.5000 mg | ORAL_TABLET | Freq: Two times a day (BID) | ORAL | 2 refills | Status: DC

## 2022-05-23 NOTE — Telephone Encounter (Signed)
Prescription Request  05/23/2022  LOV: 06/17/2021  What is the name of the medication or equipment? busPIRone (BUSPAR) 15 MG tablet   traMADol (ULTRAM) 50 MG tablet   Have you contacted your pharmacy to request a refill? Yes   Which pharmacy would you like this sent to?   [facility], [address] [place] [address] Phone: [phone] Fax: [phone]    Patient notified that their request is being sent to the clinical staff for review and that they should receive a response within 2 business days.   Please advise at Mobile [phone] (mobile)

## 2022-05-23 NOTE — Telephone Encounter (Signed)
Name of Medication: Tramadol Name of Pharmacy: [facility] or Written Date and Quantity: 01/16/22 #90 tabs/ 0 refills Last Office Visit and Type: 02/27/22 acute with another provider (CPE 06/14/21) Next Office Visit and Type: none scheduled    Buspar was filled but sent to [facility] (please send to [facility])

## 2022-06-03 ENCOUNTER — Ambulatory Visit (INDEPENDENT_AMBULATORY_CARE_PROVIDER_SITE_OTHER): Payer: Medicare Other | Admitting: Family Medicine

## 2022-06-03 ENCOUNTER — Encounter: Payer: Self-pay | Admitting: Family Medicine

## 2022-06-03 VITALS — BP 130/80 | HR 90 | Temp 97.8°F | Ht 65.0 in | Wt 181.2 lb

## 2022-06-03 DIAGNOSIS — J0110 Acute frontal sinusitis, unspecified: Secondary | ICD-10-CM | POA: Diagnosis not present

## 2022-06-03 DIAGNOSIS — J449 Chronic obstructive pulmonary disease, unspecified: Secondary | ICD-10-CM | POA: Diagnosis not present

## 2022-06-03 DIAGNOSIS — R0989 Other specified symptoms and signs involving the circulatory and respiratory systems: Secondary | ICD-10-CM

## 2022-06-03 DIAGNOSIS — J019 Acute sinusitis, unspecified: Secondary | ICD-10-CM | POA: Insufficient documentation

## 2022-06-03 LAB — POC COVID19 BINAXNOW: SARS Coronavirus 2 Ag: NEGATIVE

## 2022-06-03 MED ORDER — AMOXICILLIN-POT CLAVULANATE 875-125 MG PO TABS: 1.0000 | ORAL_TABLET | Freq: Two times a day (BID) | ORAL | 0 refills | Status: DC

## 2022-06-03 MED ORDER — BENZONATATE 200 MG PO CAPS: 200.0000 mg | ORAL_CAPSULE | Freq: Three times a day (TID) | ORAL | 1 refills | Status: DC | PRN

## 2022-06-03 NOTE — Progress Notes (Signed)
Subjective:    Patient ID: [name] [name], female    DOB: June 16, 1950, 72 y.o.   MRN: [id number]  HPI Pt presents for uri symptoms   Wt Readings from Last 3 Encounters:  06/03/22 181 lb 4 oz (82.2 kg)  05/21/22 177 lb 11.2 oz (80.6 kg)  04/23/22 176 lb (79.8 kg)   30.16 kg/m  Vitals:   06/03/22 1223 06/03/22 1301  BP: (!) 156/86 130/80  Pulse: 90   Temp: 97.8 F (36.6 C)   SpO2: 97%     Former smoker Quit 12/2021  H/o lung cancer in treatment  s/p lobectomy of lung (left /upper)  Cisplatin taxotere  Pembrolizumab for one year lanned   Is really wiped out  Recovery phase from chemo    Symptoms started late Friday evening  Runny nose  Some congested Cough - congested / yellow thick sputum Headache   No fever or chills  Some wheezing-not much more than usual   Headache -forehead with pressure  Clear sinus drainage so far  Throat is sore    Onc meds   Neuiva Decadron  Copt meds Trelegy ellipta    Bp BP Readings from Last 3 Encounters:  06/03/22 (!) 156/86  05/21/22 (!) 156/81  04/25/22 125/64   Pulse Readings from Last 3 Encounters:  06/03/22 90  05/21/22 92  04/25/22 93    Did not get the flu this season  Does not go out of the house unless abs necessary    Otc: Day quil  Helps a little bit   Results for orders placed or performed in visit on 06/03/22  POC COVID-19  Result Value Ref Range   SARS Coronavirus 2 Ag Negative Negative     Patient Active Problem List   Diagnosis Date Noted   Acute sinusitis 06/03/2022   Cancer of upper lobe of left lung 01/03/2022   S/P lobectomy of lung 12/12/2021   Lung cancer 12/05/2021   Acute pain of right shoulder 08/15/2021   Elevated glucose level 01/02/2019   Elevated TSH 11/30/2018   Grief reaction 11/24/2017   Multiple joint pain 09/16/2016   Osteopenia 06/15/2016   Screening mammogram, encounter for 04/28/2016   Estrogen deficiency 04/28/2016   Medicare annual wellness visit,  initial 01/02/2015   Colon cancer screening 05/31/2013   Hyperkalemia 11/30/2012   Retinopathy, bilateral 10/05/2012   Hyperlipidemia, mild 10/05/2012   Adverse effects of medication 03/24/2012   Routine general medical examination at a health care facility 01/22/2011   Generalized osteoarthritis of hand 07/06/2009   SEBACEOUS CYST, NECK 07/13/2007   SLEEP DISORDER 01/18/2007   POSTHERPETIC NEURALGIA 12/15/2006   CARPAL TUNNEL SYNDROME 12/15/2006   Osteoarthritis 12/15/2006   Fibromyalgia 12/15/2006   Past Medical History:  Diagnosis Date   Arthritis    OA   Carpal tunnel syndrome    Depression    Endometriosis    Fibromyalgia    Hyperlipidemia    Left upper lobe pulmonary nodule 11/2021   Post herpetic neuralgia    Sleep disorder    Tobacco abuse    Past Surgical History:  Procedure Laterality Date   ABDOMINAL HYSTERECTOMY  1992   total endometriosis   APPENDECTOMY  1992   bladder tack  1992   CATARACT EXTRACTION W/PHACO Right 03/25/2018   Procedure: CATARACT EXTRACTION PHACO AND INTRAOCULAR LENS PLACEMENT ([place]) RIGHT;  Surgeon: Marchia Meiers, MD;  Location: [facility];  Service: Ophthalmology;  Laterality: Right;  [place]  01:11 CDE 8.02 Fluid pack lot #  [id number]   CATARACT EXTRACTION W/PHACO Left 04/22/2018   Procedure: CATARACT EXTRACTION PHACO AND INTRAOCULAR LENS PLACEMENT ([place]) Left Eye;  Surgeon: Marchia Meiers, MD;  Location: [facility];  Service: Ophthalmology;  Laterality: Left;  [place]  01:02 CDE 8.11 Fluid pack lot # [id number]   COLONOSCOPY     FOOT SURGERY Right 04/2016   [place] in [place] great toe; straightening others   INTERCOSTAL NERVE BLOCK Left 12/12/2021   Procedure: INTERCOSTAL NERVE BLOCK;  Surgeon: [name], MD;  Location: [facility];  Service: Thoracic;  Laterality: Left;   IR IMAGING GUIDED PORT INSERTION  02/27/2022   LYMPH NODE DISSECTION Left 12/12/2021   Procedure: LYMPH NODE DISSECTION;  Surgeon: [name], MD;   Location: [place];  Service: Thoracic;  Laterality: Left;   VIDEO BRONCHOSCOPY N/A 12/16/2021   Procedure: VIDEO BRONCHOSCOPY;  Surgeon: [name], MD;  Location: [facility] OR;  Service: Thoracic;  Laterality: N/A;   Social History   Tobacco Use   Smoking status: Former    Packs/day: 1.00    Years: 55.00    Additional pack years: 0.00    Total pack years: 55.00    Types: Cigarettes    Quit date: 12/03/2021    Years since quitting: 0.4   Smokeless tobacco: Never  Vaping Use   Vaping Use: Some days  Substance Use Topics   Alcohol use: No    Alcohol/week: 0.0 standard drinks of alcohol   Drug use: No   Family History  Problem Relation Age of Onset   Heart disease Mother        MI   Heart disease Father        MI   Arthritis Sister        crippling arthritis   Arthritis Brother        crippling arthritis   No Known Allergies Current Outpatient Medications on File Prior to Visit  Medication Sig Dispense Refill   acetaminophen (TYLENOL) 500 MG tablet Take 500 mg by mouth every 6 (six) hours as needed.     aspirin 81 MG tablet Take 81 mg by mouth daily.     busPIRone (BUSPAR) 15 MG tablet Take 0.5 tablets (7.5 mg total) by mouth 2 (two) times daily. 90 tablet 2   Cobalamin Combinations (NEURIVA PLUS PO) Take by mouth.     dexamethasone (DECADRON) 4 MG tablet Take 1 tablet (4 mg total) by mouth 2 (two) times daily with a meal. Start 2 days prior to chemo. Take for 2 days; DO NOT take on the day of chemo. 60 tablet 0   docusate sodium (COLACE) 100 MG capsule Take 200 mg by mouth daily.     DULoxetine (CYMBALTA) 60 MG capsule TAKE 1 CAPSULE BY MOUTH EVERY DAY 90 capsule 1   Fluticasone-Umeclidin-Vilant (TRELEGY ELLIPTA) 100-62.5-25 MCG/ACT AEPB Inhale 1 puff into the lungs daily. 28 each 11   lidocaine-prilocaine (EMLA) cream Apply on the port. 30 -45 min  prior to port access. 30 g 3   magic mouthwash (nystatin, lidocaine, diphenhydrAMINE, alum & mag hydroxide) suspension Swish  and spit 5 mLs 4 (four) times daily as needed for mouth pain. 240 mL 1   MAGNESIUM CITRATE PO Take by mouth daily.     Melatonin 10 MG CAPS Take 20 mg by mouth at bedtime.     naproxen sodium (ALEVE) 220 MG tablet Take 440 mg by mouth daily as needed (headaches).     nystatin (MYCOSTATIN) 100000 UNIT/ML suspension  Take 5 mLs (500,000 Units total) by mouth 4 (four) times daily. 60 mL 0   ondansetron (ZOFRAN) 8 MG tablet One pill every 8 hours as needed for nausea/vomitting. 40 tablet 1   pregabalin (LYRICA) 75 MG capsule TAKE 1 CAPSULE BY MOUTH TWICE A DAY 180 capsule 1   prochlorperazine (COMPAZINE) 10 MG tablet Take 1 tablet (10 mg total) by mouth every 6 (six) hours as needed for nausea or vomiting. 40 tablet 1   senna (SENOKOT) 8.6 MG tablet Take 1 tablet (8.6 mg total) by mouth 2 (two) times daily as needed for constipation. 14 tablet 0   traMADol (ULTRAM) 50 MG tablet Take 1 tablet (50 mg total) by mouth every 8 (eight) hours as needed. 90 tablet 0   Current Facility-Administered Medications on File Prior to Visit  Medication Dose Route Frequency Provider Last Rate Last Admin   0.9 %  sodium chloride infusion   Intravenous Continuous Hughie Closs, PA-C   Stopped at 02/17/22 1214   sodium chloride flush (NS) 0.9 % injection 10 mL  10 mL Intravenous Once Verlon Au, NP        Review of Systems  Constitutional:  Positive for appetite change. Negative for fatigue and fever.  HENT:  Positive for congestion, ear pain, postnasal drip, rhinorrhea, sinus pressure and sore throat. Negative for nosebleeds.   Eyes:  Negative for pain, redness and itching.  Respiratory:  Positive for cough and wheezing. Negative for shortness of breath.   Cardiovascular:  Negative for chest pain.  Gastrointestinal:  Negative for abdominal pain, diarrhea, nausea and vomiting.  Endocrine: Negative for polyuria.  Genitourinary:  Negative for dysuria, frequency and urgency.  Musculoskeletal:  Negative for  arthralgias and myalgias.  Allergic/Immunologic: Negative for immunocompromised state.  Neurological:  Positive for headaches. Negative for dizziness, tremors, syncope, weakness and numbness.  Hematological:  Negative for adenopathy. Does not bruise/bleed easily.  Psychiatric/Behavioral:  Negative for dysphoric mood. The patient is not nervous/anxious.        Objective:   Physical Exam Constitutional:      General: She is not in acute distress.    Appearance: Normal appearance. She is well-developed. She is obese. She is not ill-appearing or diaphoretic.  HENT:     Head: Normocephalic and atraumatic.     Comments: Bilateral maxillary and frontal sinus tenderness-worse on the R    Right Ear: Tympanic membrane and external ear normal.     Left Ear: Tympanic membrane and external ear normal.     Nose: Congestion and rhinorrhea present.     Mouth/Throat:     Pharynx: Oropharynx is clear. No oropharyngeal exudate or posterior oropharyngeal erythema.     Comments: Clear pnd Eyes:     General:        Right eye: No discharge.        Left eye: No discharge.     Conjunctiva/sclera: Conjunctivae normal.     Pupils: Pupils are equal, round, and reactive to light.  Cardiovascular:     Rate and Rhythm: Normal rate and regular rhythm.  Pulmonary:     Effort: Pulmonary effort is normal. No respiratory distress.     Breath sounds: Normal breath sounds. No stridor. No wheezing, rhonchi or rales.     Comments: Diffusely distant bs   No rales /crackles  Scant wheeze only on forced exp Musculoskeletal:     Cervical back: Normal range of motion and neck supple.  Lymphadenopathy:     Cervical: No  cervical adenopathy.  Skin:    General: Skin is warm and dry.     Findings: No rash.  Neurological:     Mental Status: She is alert.     Cranial Nerves: No cranial nerve deficit.     Coordination: Coordination normal.  Psychiatric:        Mood and Affect: Mood normal.           Assessment  & Plan:   Problem List Items Addressed This Visit       Respiratory   Acute sinusitis - Primary    This could be from viral uri or allergies  In setting of recent chemo for lung cancer   Disc symptom care-see AVS If wheezing inc may need steroid (not on dexameth now) Augmentin sent to pharmacy  Tessalon for cough  ER precautions noted Update if not starting to improve in a week or if worsening        Relevant Medications   amoxicillin-clavulanate (AUGMENTIN) 875-125 MG tablet   benzonatate (TESSALON) 200 MG capsule   Stage 1 mild COPD by GOLD classification    In setting of lung cancer No longer smokes   Using the trelegy ellipta with success       Relevant Medications   benzonatate (TESSALON) 200 MG capsule   Other Visit Diagnoses     Chest congestion       Relevant Orders   POC COVID-19 (Completed)

## 2022-06-03 NOTE — Patient Instructions (Addendum)
Drink fluids and rest  mucinex DM is good for cough and congestion  Nasal saline for congestion as needed  Tylenol for fever or pain or headache  Please alert [place] if symptoms worsen (if severe or short of breath please go to the ER)   Take augmentin for sinus infection  Tessalon for cough   Watch for wheezing  Stay on your regular medicines   Update if not starting to improve in a week or if worsening

## 2022-06-03 NOTE — Assessment & Plan Note (Signed)
In setting of lung cancer No longer smokes   Using the trelegy ellipta with success

## 2022-06-03 NOTE — Assessment & Plan Note (Signed)
This could be from viral uri or allergies  In setting of recent chemo for lung cancer   Disc symptom care-see AVS If wheezing inc may need steroid (not on dexameth now) Augmentin sent to pharmacy  Tessalon for cough  ER precautions noted Update if not starting to improve in a week or if worsening

## 2022-06-10 ENCOUNTER — Encounter (INDEPENDENT_AMBULATORY_CARE_PROVIDER_SITE_OTHER): Payer: Self-pay | Admitting: Vascular Surgery

## 2022-06-10 ENCOUNTER — Ambulatory Visit (INDEPENDENT_AMBULATORY_CARE_PROVIDER_SITE_OTHER): Payer: Medicare Other | Admitting: Vascular Surgery

## 2022-06-10 VITALS — BP 132/81 | HR 67 | Resp 16 | Wt 182.4 lb

## 2022-06-10 DIAGNOSIS — C3412 Malignant neoplasm of upper lobe, left bronchus or lung: Secondary | ICD-10-CM | POA: Diagnosis not present

## 2022-06-10 DIAGNOSIS — E785 Hyperlipidemia, unspecified: Secondary | ICD-10-CM

## 2022-06-10 DIAGNOSIS — I7122 Aneurysm of the aortic arch, without rupture: Secondary | ICD-10-CM | POA: Diagnosis not present

## 2022-06-10 NOTE — Progress Notes (Unsigned)
Patient ID: [name] [name], female   DOB: 10/20/1950, 72 y.o.   MRN: [id number]  Chief Complaint  Patient presents with   New Patient (Initial Visit)    Ref consult cancer of upper lobe of left lung    HPI [name] [name] is a 72 y.o. female.  I am asked to see the patient by Dr. Donneta Romberg for evaluation of a thoracic aortic aneurysm.  This was seen on the CT scan of the chest performed for her lung cancer.  I have independently reviewed the CT scan.  She has a small saccular aneurysm in the transverse arch.  The outpouching measures roughly a centimeter.  This is slightly enlarged from a previous study where it measured about 8 mm.  No dissection.  No evidence of extravasation.  She does not have any chest pain or back pain or signs of peripheral embolization.   Past Medical History:  Diagnosis Date   Arthritis    OA   Carpal tunnel syndrome    Depression    Endometriosis    Fibromyalgia    Hyperlipidemia    Left upper lobe pulmonary nodule 11/2021   Post herpetic neuralgia    Sleep disorder    Tobacco abuse     Past Surgical History:  Procedure Laterality Date   ABDOMINAL HYSTERECTOMY  1992   total endometriosis   APPENDECTOMY  1992   bladder tack  1992   CATARACT EXTRACTION W/PHACO Right 03/25/2018   Procedure: CATARACT EXTRACTION PHACO AND INTRAOCULAR LENS PLACEMENT (IOC) RIGHT;  Surgeon: Elliot Cousin, MD;  Location: [facility] ORS;  Service: Ophthalmology;  Laterality: Right;  [place]  01:11 CDE 8.02 Fluid pack lot # [id number]   CATARACT EXTRACTION W/PHACO Left 04/22/2018   Procedure: CATARACT EXTRACTION PHACO AND INTRAOCULAR LENS PLACEMENT (IOC) Left Eye;  Surgeon: Elliot Cousin, MD;  Location: [facility] ORS;  Service: Ophthalmology;  Laterality: Left;  [place]  01:02 CDE 8.11 Fluid pack lot # [id number]   COLONOSCOPY     FOOT SURGERY Right 04/2016   [facility] in [place] great toe; straightening others   INTERCOSTAL NERVE BLOCK Left 12/12/2021   Procedure: INTERCOSTAL  NERVE BLOCK;  Surgeon: Loreli Slot, MD;  Location: [facility] OR;  Service: Thoracic;  Laterality: Left;   IR IMAGING GUIDED PORT INSERTION  02/27/2022   LYMPH NODE DISSECTION Left 12/12/2021   Procedure: LYMPH NODE DISSECTION;  Surgeon: Loreli Slot, MD;  Location: [facility] OR;  Service: Thoracic;  Laterality: Left;   VIDEO BRONCHOSCOPY N/A 12/16/2021   Procedure: VIDEO BRONCHOSCOPY;  Surgeon: Loreli Slot, MD;  Location: [facility] OR;  Service: Thoracic;  Laterality: N/A;     Family History  Problem Relation Age of Onset   Heart disease Mother        MI   Heart disease Father        MI   Arthritis Sister        crippling arthritis   Arthritis Brother        crippling arthritis      Social History   Tobacco Use   Smoking status: Former    Packs/day: 1.00    Years: 55.00    Additional pack years: 0.00    Total pack years: 55.00    Types: Cigarettes    Quit date: 12/03/2021    Years since quitting: 0.5   Smokeless tobacco: Never  Vaping Use   Vaping Use: Some days  Substance Use Topics   Alcohol use: No  Alcohol/week: 0.0 standard drinks of alcohol   Drug use: No     No Known Allergies  Current Outpatient Medications  Medication Sig Dispense Refill   acetaminophen (TYLENOL) 500 MG tablet Take 500 mg by mouth every 6 (six) hours as needed.     amoxicillin-clavulanate (AUGMENTIN) 875-125 MG tablet Take 1 tablet by mouth 2 (two) times daily. 20 tablet 0   aspirin 81 MG tablet Take 81 mg by mouth daily.     benzonatate (TESSALON) 200 MG capsule Take 1 capsule (200 mg total) by mouth 3 (three) times daily as needed for cough. Swallow whole 30 capsule 1   busPIRone (BUSPAR) 15 MG tablet Take 0.5 tablets (7.5 mg total) by mouth 2 (two) times daily. 90 tablet 2   Cobalamin Combinations (NEURIVA PLUS PO) Take by mouth.     dexamethasone (DECADRON) 4 MG tablet Take 1 tablet (4 mg total) by mouth 2 (two) times daily with a meal. Start 2 days prior to chemo. Take for  2 days; DO NOT take on the day of chemo. 60 tablet 0   docusate sodium (COLACE) 100 MG capsule Take 200 mg by mouth daily.     DULoxetine (CYMBALTA) 60 MG capsule TAKE 1 CAPSULE BY MOUTH EVERY DAY 90 capsule 1   Fluticasone-Umeclidin-Vilant (TRELEGY ELLIPTA) 100-62.5-25 MCG/ACT AEPB Inhale 1 puff into the lungs daily. 28 each 11   lidocaine-prilocaine (EMLA) cream Apply on the port. 30 -45 min  prior to port access. 30 g 3   magic mouthwash (nystatin, lidocaine, diphenhydrAMINE, alum & mag hydroxide) suspension Swish and spit 5 mLs 4 (four) times daily as needed for mouth pain. 240 mL 1   MAGNESIUM CITRATE PO Take by mouth daily.     Melatonin 10 MG CAPS Take 20 mg by mouth at bedtime.     naproxen sodium (ALEVE) 220 MG tablet Take 440 mg by mouth daily as needed (headaches).     nystatin (MYCOSTATIN) 100000 UNIT/ML suspension Take 5 mLs (500,000 Units total) by mouth 4 (four) times daily. 60 mL 0   ondansetron (ZOFRAN) 8 MG tablet One pill every 8 hours as needed for nausea/vomitting. 40 tablet 1   pregabalin (LYRICA) 75 MG capsule TAKE 1 CAPSULE BY MOUTH TWICE A DAY 180 capsule 1   prochlorperazine (COMPAZINE) 10 MG tablet Take 1 tablet (10 mg total) by mouth every 6 (six) hours as needed for nausea or vomiting. 40 tablet 1   senna (SENOKOT) 8.6 MG tablet Take 1 tablet (8.6 mg total) by mouth 2 (two) times daily as needed for constipation. 14 tablet 0   traMADol (ULTRAM) 50 MG tablet Take 1 tablet (50 mg total) by mouth every 8 (eight) hours as needed. 90 tablet 0   No current facility-administered medications for this visit.   Facility-Administered Medications Ordered in Other Visits  Medication Dose Route Frequency Provider Last Rate Last Admin   0.9 %  sodium chloride infusion   Intravenous Continuous Covington, Sarah M, PA-C   Stopped at 02/17/22 1214   sodium chloride flush (NS) 0.9 % injection 10 mL  10 mL Intravenous Once Alinda Dooms, NP          REVIEW OF SYSTEMS (Negative  unless checked)  Constitutional: [] Weight loss  [] Fever  [] Chills Cardiac: [] Chest pain   [] Chest pressure   [] Palpitations   [] Shortness of breath when laying flat   [] Shortness of breath at rest   [x] Shortness of breath with exertion. Vascular:  [] Pain in legs with walking   []   Pain in legs at rest   [] Pain in legs when laying flat   [] Claudication   [] Pain in feet when walking  [] Pain in feet at rest  [] Pain in feet when laying flat   [] History of DVT   [] Phlebitis   [] Swelling in legs   [] Varicose veins   [] Non-healing ulcers Pulmonary:   [] Uses home oxygen   [] Productive cough   [] Hemoptysis   [] Wheeze  [] COPD   [] Asthma Neurologic:  [] Dizziness  [] Blackouts   [] Seizures   [] History of stroke   [] History of TIA  [] Aphasia   [] Temporary blindness   [] Dysphagia   [] Weakness or numbness in arms   [] Weakness or numbness in legs Musculoskeletal:  [x] Arthritis   [] Joint swelling   [x] Joint pain   [] Low back pain Hematologic:  [] Easy bruising  [] Easy bleeding   [] Hypercoagulable state   [] Anemic  [] Hepatitis Gastrointestinal:  [] Blood in stool   [] Vomiting blood  [] Gastroesophageal reflux/heartburn   [] Abdominal pain Genitourinary:  [] Chronic kidney disease   [] Difficult urination  [] Frequent urination  [] Burning with urination   [] Hematuria Skin:  [] Rashes   [] Ulcers   [] Wounds Psychological:  [] History of anxiety   []  History of major depression.    Physical Exam BP 132/81 (BP Location: Right Arm)   Pulse 67   Resp 16   Wt 182 lb 6.4 oz (82.7 kg)   BMI 30.35 kg/m  Gen:  WD/WN, NAD Head: [place]/AT, No temporalis wasting.  Ear/Nose/Throat: Hearing grossly intact, nares w/o erythema or drainage, oropharynx w/o Erythema/Exudate Eyes: Conjunctiva clear, sclera non-icteric  Neck: trachea midline.  No JVD.  Pulmonary:  Good air movement, respirations not labored, no use of accessory muscles  Cardiac: RRR, no JVD Vascular:  Vessel Right Left  Radial Palpable Palpable                                    Gastrointestinal:. No masses, surgical incisions, or scars. Musculoskeletal: M/S 5/5 throughout.  Extremities without ischemic changes.  No deformity or atrophy.  No edema. Neurologic: Sensation grossly intact in extremities.  Symmetrical.  Speech is fluent. Motor exam as listed above. Psychiatric: Judgment intact, Mood & affect appropriate for pt's clinical situation. Dermatologic: No rashes or ulcers noted.  No cellulitis or open wounds.    Radiology No results found.  Labs Recent Results (from the past 2160 hour(s))  Magnesium     Status: None   Collection Time: 03/26/22  2:52 PM  Result Value Ref Range   Magnesium 2.1 1.7 - 2.4 mg/dL    Comment: Performed at [facility], [address] [place], [place] [address]  Comprehensive metabolic panel     Status: Abnormal   Collection Time: 03/26/22  2:52 PM  Result Value Ref Range   Sodium 134 (L) 135 - 145 mmol/L   Potassium 5.1 3.5 - 5.1 mmol/L   Chloride 99 98 - 111 mmol/L   CO2 25 22 - 32 mmol/L   Glucose, Bld 118 (H) 70 - 99 mg/dL    Comment: Glucose reference range applies only to samples taken after fasting for at least 8 hours.   BUN 17 8 - 23 mg/dL   Creatinine, Ser 6.04 0.44 - 1.00 mg/dL   Calcium 9.7 8.9 - 54.0 mg/dL   Total Protein 7.2 6.5 - 8.1 g/dL   Albumin 3.7 3.5 - 5.0 g/dL   AST 17 15 - 41 U/L  ALT 13 0 - 44 U/L   Alkaline Phosphatase 103 38 - 126 U/L   Total Bilirubin 0.6 0.3 - 1.2 mg/dL   GFR, Estimated >03 >00 mL/min    Comment: (NOTE) Calculated using the CKD-EPI Creatinine Equation (2021)    Anion gap 10 5 - 15    Comment: Performed at [facility], [address] [place], [place] [address]  CBC with Differential     Status: Abnormal   Collection Time: 03/26/22  2:52 PM  Result Value Ref Range   WBC 12.3 (H) 4.0 - 10.5 K/uL   RBC 3.72 (L) 3.87 - 5.11 MIL/uL   Hemoglobin 11.1 (L) 12.0 - 15.0 g/dL   HCT 07.6 (L) 22.6 - 33.3 %   MCV 93.8 80.0 - 100.0 fL   MCH 29.8 26.0  - 34.0 pg   MCHC 31.8 30.0 - 36.0 g/dL   RDW 54.5 (H) 62.5 - 63.8 %   Platelets 225 150 - 400 K/uL   nRBC 0.0 0.0 - 0.2 %   Neutrophils Relative % 86 %   Neutro Abs 10.6 (H) 1.7 - 7.7 K/uL   Lymphocytes Relative 9 %   Lymphs Abs 1.1 0.7 - 4.0 K/uL   Monocytes Relative 4 %   Monocytes Absolute 0.5 0.1 - 1.0 K/uL   Eosinophils Relative 0 %   Eosinophils Absolute 0.0 0.0 - 0.5 K/uL   Basophils Relative 0 %   Basophils Absolute 0.0 0.0 - 0.1 K/uL   Immature Granulocytes 1 %   Abs Immature Granulocytes 0.12 (H) 0.00 - 0.07 K/uL    Comment: Performed at [facility], [address] [place], [place] [address]  Magnesium     Status: None   Collection Time: 03/31/22  9:51 AM  Result Value Ref Range   Magnesium 2.0 1.7 - 2.4 mg/dL    Comment: Performed at [facility], [address] [place], [place] [address]  Comprehensive metabolic panel     Status: Abnormal   Collection Time: 03/31/22  9:51 AM  Result Value Ref Range   Sodium 133 (L) 135 - 145 mmol/L   Potassium 3.1 (L) 3.5 - 5.1 mmol/L   Chloride 94 (L) 98 - 111 mmol/L   CO2 29 22 - 32 mmol/L   Glucose, Bld 129 (H) 70 - 99 mg/dL    Comment: Glucose reference range applies only to samples taken after fasting for at least 8 hours.   BUN 21 8 - 23 mg/dL   Creatinine, Ser 1.15 0.44 - 1.00 mg/dL   Calcium 9.1 8.9 - 72.6 mg/dL   Total Protein 6.9 6.5 - 8.1 g/dL   Albumin 3.7 3.5 - 5.0 g/dL   AST 23 15 - 41 U/L   ALT 19 0 - 44 U/L   Alkaline Phosphatase 128 (H) 38 - 126 U/L   Total Bilirubin 1.1 0.3 - 1.2 mg/dL   GFR, Estimated >20 >35 mL/min    Comment: (NOTE) Calculated using the CKD-EPI Creatinine Equation (2021)    Anion gap 10 5 - 15    Comment: Performed at [facility], [address] [place], [place] [address]  CBC with Differential     Status: Abnormal   Collection Time: 03/31/22  9:51 AM  Result Value Ref Range   WBC 19.6 (H) 4.0 - 10.5 K/uL   RBC 3.97 3.87 - 5.11 MIL/uL   Hemoglobin 12.0 12.0  - 15.0 g/dL   HCT 63.8 (L) 45.3 - 64.6 %  MCV 88.2 80.0 - 100.0 fL   MCH 30.2 26.0 - 34.0 pg   MCHC 34.3 30.0 - 36.0 g/dL   RDW 16.1 (H) 09.6 - 04.5 %   Platelets 127 (L) 150 - 400 K/uL   nRBC 0.0 0.0 - 0.2 %   Neutrophils Relative % 87 %   Lymphocytes Relative 8 %   Monocytes Relative 1 %   Eosinophils Relative 0 %   Basophils Relative 0 %   Band Neutrophils 0 %   Metamyelocytes Relative 1 %   Myelocytes 3 %   Promyelocytes Relative 0 %   Blasts 0 %   nRBC 0 0 /100 WBC   Other 0 %   Neutro Abs 17.0 (H) 1.7 - 7.7 K/uL   Lymphs Abs 1.6 0.7 - 4.0 K/uL   Monocytes Absolute 0.2 0.1 - 1.0 K/uL   Eosinophils Absolute 0.0 0.0 - 0.5 K/uL   Basophils Absolute 0.0 0.0 - 0.1 K/uL   Abs Immature Granulocytes 0.78 (H) 0.00 - 0.07 K/uL   Immature Granulocytes MILD LEFT SHIFT (1-5% METAS, OCC MYELO, OCC BANDS) %   RBC Morphology MORPHOLOGY UNREMARKABLE    WBC Morphology INCREASED BANDS (>20% BANDS)    Smear Review PLATELETS APPEAR DECREASED     Comment: Normal platelet morphology Performed at [facility], [address] [place], [place] [address]   Basic metabolic panel     Status: Abnormal   Collection Time: 04/02/22  9:29 AM  Result Value Ref Range   Sodium 135 135 - 145 mmol/L   Potassium 3.5 3.5 - 5.1 mmol/L   Chloride 101 98 - 111 mmol/L   CO2 24 22 - 32 mmol/L   Glucose, Bld 109 (H) 70 - 99 mg/dL    Comment: Glucose reference range applies only to samples taken after fasting for at least 8 hours.   BUN 17 8 - 23 mg/dL   Creatinine, Ser 1.91 0.44 - 1.00 mg/dL   Calcium 9.0 8.9 - 47.8 mg/dL   GFR, Estimated >29 >56 mL/min    Comment: (NOTE) Calculated using the CKD-EPI Creatinine Equation (2021)    Anion gap 10 5 - 15    Comment: Performed at [facility], [address] [place], [place] [address]  Magnesium     Status: None   Collection Time: 04/02/22  9:29 AM  Result Value Ref Range   Magnesium 1.9 1.7 - 2.4 mg/dL    Comment: Performed at [facility], [address] [place], [place] [address]  CBC with Differential/Platelet     Status: Abnormal   Collection Time: 04/02/22  9:29 AM  Result Value Ref Range   WBC 5.2 4.0 - 10.5 K/uL   RBC 3.61 (L) 3.87 - 5.11 MIL/uL   Hemoglobin 10.9 (L) 12.0 - 15.0 g/dL   HCT 69.6 (L) 29.5 - 28.4 %   MCV 88.6 80.0 - 100.0 fL   MCH 30.2 26.0 - 34.0 pg   MCHC 34.1 30.0 - 36.0 g/dL   RDW 13.2 (H) 44.0 - 10.2 %   Platelets 97 (L) 150 - 400 K/uL   nRBC 0.0 0.0 - 0.2 %   Neutrophils Relative % 68 %   Neutro Abs 3.6 1.7 - 7.7 K/uL   Lymphocytes Relative 20 %   Lymphs Abs 1.0 0.7 - 4.0 K/uL   Monocytes Relative 9 %   Monocytes Absolute 0.5 0.1 - 1.0 K/uL   Eosinophils Relative 2 %   Eosinophils Absolute 0.1 0.0 -  0.5 K/uL   Basophils Relative 0 %   Basophils Absolute 0.0 0.0 - 0.1 K/uL   WBC Morphology DOHLE BODIES     Comment: TOXIC GRANULATION   RBC Morphology See Note     Comment: SOME ANISOCYTOSIS   Smear Review Normal platelet morphology     Comment: PLATELETS APPEAR DECREASED   Immature Granulocytes 1 %   Abs Immature Granulocytes 0.06 0.00 - 0.07 K/uL    Comment: Performed at [facility], [address] [place], [place] [address]  Magnesium     Status: None   Collection Time: 04/16/22  8:26 AM  Result Value Ref Range   Magnesium 2.1 1.7 - 2.4 mg/dL    Comment: Performed at [facility], [address] [place], [place] [address]  Comprehensive metabolic panel     Status: Abnormal   Collection Time: 04/16/22  8:26 AM  Result Value Ref Range   Sodium 133 (L) 135 - 145 mmol/L   Potassium 4.3 3.5 - 5.1 mmol/L   Chloride 103 98 - 111 mmol/L   CO2 23 22 - 32 mmol/L   Glucose, Bld 123 (H) 70 - 99 mg/dL    Comment: Glucose reference range applies only to samples taken after fasting for at least 8 hours.   BUN 14 8 - 23 mg/dL   Creatinine, Ser 3.240.58 0.44 - 1.00 mg/dL   Calcium 9.2 8.9 - 40.110.3 mg/dL   Total Protein 6.5 6.5 - 8.1 g/dL   Albumin 3.5 3.5 - 5.0 g/dL   AST  18 15 - 41 U/L   ALT 12 0 - 44 U/L   Alkaline Phosphatase 99 38 - 126 U/L   Total Bilirubin 0.3 0.3 - 1.2 mg/dL   GFR, Estimated >02>60 >72>60 mL/min    Comment: (NOTE) Calculated using the CKD-EPI Creatinine Equation (2021)    Anion gap 7 5 - 15    Comment: Performed at [facility], [address] [place], [place] [address]  CBC with Differential     Status: Abnormal   Collection Time: 04/16/22  8:26 AM  Result Value Ref Range   WBC 10.2 4.0 - 10.5 K/uL   RBC 3.42 (L) 3.87 - 5.11 MIL/uL   Hemoglobin 10.4 (L) 12.0 - 15.0 g/dL   HCT 40.331.4 (L) 47.436.0 - 25.946.0 %   MCV 91.8 80.0 - 100.0 fL   MCH 30.4 26.0 - 34.0 pg   MCHC 33.1 30.0 - 36.0 g/dL   RDW 56.318.5 (H) 87.511.5 - 64.315.5 %   Platelets 267 150 - 400 K/uL   nRBC 0.0 0.0 - 0.2 %   Neutrophils Relative % 79 %   Neutro Abs 8.0 (H) 1.7 - 7.7 K/uL   Lymphocytes Relative 13 %   Lymphs Abs 1.3 0.7 - 4.0 K/uL   Monocytes Relative 7 %   Monocytes Absolute 0.7 0.1 - 1.0 K/uL   Eosinophils Relative 0 %   Eosinophils Absolute 0.0 0.0 - 0.5 K/uL   Basophils Relative 0 %   Basophils Absolute 0.0 0.0 - 0.1 K/uL   Immature Granulocytes 1 %   Abs Immature Granulocytes 0.10 (H) 0.00 - 0.07 K/uL    Comment: Performed at [facility], [address] [place], [place] [address]  Magnesium     Status: None   Collection Time: 04/23/22  9:59 AM  Result Value Ref Range   Magnesium 1.7 1.7 - 2.4 mg/dL    Comment: Performed at [facility], [address]  [place], [place] [address]  [place] [facility] - Cancer [facility] Only     Status: Abnormal   Collection Time: 04/23/22  9:59 AM  Result Value Ref Range   Sodium 131 (L) 135 - 145 mmol/L   Potassium 4.4 3.5 - 5.1 mmol/L   Chloride 100 98 - 111 mmol/L   CO2 24 22 - 32 mmol/L   Glucose, Bld 112 (H) 70 - 99 mg/dL    Comment: Glucose reference range applies only to samples taken after fasting for at least 8 hours.   BUN 15 8 - 23 mg/dL   Creatinine 6.04 5.40 - 1.00 mg/dL   Calcium 8.7 (L)  8.9 - 10.3 mg/dL   GFR, Estimated >98 >11 mL/min    Comment: (NOTE) Calculated using the CKD-EPI Creatinine Equation (2021)    Anion gap 7 5 - 15    Comment: Performed at [facility], [address] [place], [place] [address]  CBC with Differential (Cancer Center Only)     Status: Abnormal   Collection Time: 04/23/22  9:59 AM  Result Value Ref Range   WBC Count 4.8 4.0 - 10.5 K/uL   RBC 3.62 (L) 3.87 - 5.11 MIL/uL   Hemoglobin 11.2 (L) 12.0 - 15.0 g/dL   HCT 29.5 (L) 62.1 - 30.8 %   MCV 92.0 80.0 - 100.0 fL   MCH 30.9 26.0 - 34.0 pg   MCHC 33.6 30.0 - 36.0 g/dL   RDW 65.7 (H) 84.6 - 96.2 %   Platelet Count 141 (L) 150 - 400 K/uL   nRBC 0.0 0.0 - 0.2 %   Neutrophils Relative % 66 %   Neutro Abs 3.2 1.7 - 7.7 K/uL   Lymphocytes Relative 24 %   Lymphs Abs 1.1 0.7 - 4.0 K/uL   Monocytes Relative 8 %   Monocytes Absolute 0.4 0.1 - 1.0 K/uL   Eosinophils Relative 0 %   Eosinophils Absolute 0.0 0.0 - 0.5 K/uL   Basophils Relative 1 %   Basophils Absolute 0.1 0.0 - 0.1 K/uL   WBC Morphology DIFF. CONFIRMED BY SMEAR    RBC Morphology MORPHOLOGY UNREMARKABLE    Smear Review Normal platelet morphology     Comment: PLATELETS APPEAR DECREASED   Immature Granulocytes 1 %   Abs Immature Granulocytes 0.04 0.00 - 0.07 K/uL    Comment: Performed at [facility], [address], [place], [place] [address]  Magnesium     Status: None   Collection Time: 05/21/22  9:21 AM  Result Value Ref Range   Magnesium 1.9 1.7 - 2.4 mg/dL    Comment: Performed at [facility], [address] [place], [place] [address]  CMP (Cancer Center only)     Status: Abnormal   Collection Time: 05/21/22  9:21 AM  Result Value Ref Range   Sodium 134 (L) 135 - 145 mmol/L   Potassium 4.1 3.5 - 5.1 mmol/L   Chloride 104 98 - 111 mmol/L   CO2 24 22 - 32 mmol/L   Glucose, Bld 130 (H) 70 - 99 mg/dL    Comment: Glucose reference range applies only to samples taken after fasting for at least 8  hours.   BUN 11 8 - 23 mg/dL   Creatinine 0.10 2.72 - 1.00 mg/dL   Calcium 9.3 8.9 - 53.6 mg/dL   Total Protein 6.6 6.5 - 8.1 g/dL   Albumin 3.6 3.5 - 5.0 g/dL   AST 18 15 - 41 U/L   ALT 9 0 -  44 U/L   Alkaline Phosphatase 103 38 - 126 U/L   Total Bilirubin 0.6 0.3 - 1.2 mg/dL   GFR, Estimated >16 >10 mL/min    Comment: (NOTE) Calculated using the CKD-EPI Creatinine Equation (2021)    Anion gap 6 5 - 15    Comment: Performed at [facility], [address] [place], [place] [address]  CBC with Differential (Cancer Center Only)     Status: Abnormal   Collection Time: 05/21/22  9:21 AM  Result Value Ref Range   WBC Count 4.6 4.0 - 10.5 K/uL   RBC 3.63 (L) 3.87 - 5.11 MIL/uL   Hemoglobin 11.3 (L) 12.0 - 15.0 g/dL   HCT 40.9 (L) 81.1 - 91.4 %   MCV 96.1 80.0 - 100.0 fL   MCH 31.1 26.0 - 34.0 pg   MCHC 32.4 30.0 - 36.0 g/dL   RDW 78.2 95.6 - 21.3 %   Platelet Count 195 150 - 400 K/uL   nRBC 0.0 0.0 - 0.2 %   Neutrophils Relative % 59 %   Neutro Abs 2.7 1.7 - 7.7 K/uL   Lymphocytes Relative 28 %   Lymphs Abs 1.3 0.7 - 4.0 K/uL   Monocytes Relative 8 %   Monocytes Absolute 0.4 0.1 - 1.0 K/uL   Eosinophils Relative 5 %   Eosinophils Absolute 0.2 0.0 - 0.5 K/uL   Basophils Relative 0 %   Basophils Absolute 0.0 0.0 - 0.1 K/uL   Immature Granulocytes 0 %   Abs Immature Granulocytes 0.01 0.00 - 0.07 K/uL    Comment: Performed at [facility], [address] [place], [place] [address]  POC COVID-19     Status: None   Collection Time: 06/03/22 12:39 PM  Result Value Ref Range   SARS Coronavirus 2 Ag Negative Negative    Assessment/Plan:  Thoracic aortic aneurysm Patient has a small saccular aneurysm of the transverse aortic arch.  This is small and not particularly high risk for rupture.  She is already being followed on every 55-month intervals with CT scans due to her lung cancer that can continue.  We will follow aortic aneurysm as well with the CT scans.  I  discussed the importance of remaining abstinent from tobacco and controlling her blood pressure.  Cancer of upper lobe of left lung (HCC) Followed by oncology.  Will be getting serial CT scans.  Hyperlipidemia, mild lipid control important in reducing the progression of atherosclerotic disease.       Festus Barren 06/11/2022, 12:14 PM   This note was created with Dragon medical transcription system.  Any errors from dictation are unintentional.

## 2022-06-11 DIAGNOSIS — I712 Thoracic aortic aneurysm, without rupture, unspecified: Secondary | ICD-10-CM | POA: Insufficient documentation

## 2022-06-11 NOTE — Assessment & Plan Note (Signed)
Followed by oncology.  Will be getting serial CT scans.

## 2022-06-11 NOTE — Assessment & Plan Note (Signed)
lipid control important in reducing the progression of atherosclerotic disease.   

## 2022-06-11 NOTE — Assessment & Plan Note (Signed)
Patient has a small saccular aneurysm of the transverse aortic arch.  This is small and not particularly high risk for rupture.  She is already being followed on every 13-month intervals with CT scans due to her lung cancer that can continue.  We will follow aortic aneurysm as well with the CT scans.  I discussed the importance of remaining abstinent from tobacco and controlling her blood pressure.

## 2022-06-16 ENCOUNTER — Encounter: Payer: Self-pay | Admitting: Internal Medicine

## 2022-06-16 ENCOUNTER — Inpatient Hospital Stay: Payer: Medicare Other

## 2022-06-16 ENCOUNTER — Inpatient Hospital Stay: Payer: Medicare Other | Attending: Internal Medicine | Admitting: Internal Medicine

## 2022-06-16 VITALS — BP 137/84 | HR 80 | Temp 97.8°F | Resp 17 | Wt 179.0 lb

## 2022-06-16 DIAGNOSIS — Z7952 Long term (current) use of systemic steroids: Secondary | ICD-10-CM | POA: Insufficient documentation

## 2022-06-16 DIAGNOSIS — Z9049 Acquired absence of other specified parts of digestive tract: Secondary | ICD-10-CM | POA: Diagnosis not present

## 2022-06-16 DIAGNOSIS — I7122 Aneurysm of the aortic arch, without rupture: Secondary | ICD-10-CM | POA: Diagnosis not present

## 2022-06-16 DIAGNOSIS — R5383 Other fatigue: Secondary | ICD-10-CM | POA: Insufficient documentation

## 2022-06-16 DIAGNOSIS — C3412 Malignant neoplasm of upper lobe, left bronchus or lung: Secondary | ICD-10-CM

## 2022-06-16 DIAGNOSIS — Z8249 Family history of ischemic heart disease and other diseases of the circulatory system: Secondary | ICD-10-CM | POA: Insufficient documentation

## 2022-06-16 DIAGNOSIS — Z90710 Acquired absence of both cervix and uterus: Secondary | ICD-10-CM | POA: Insufficient documentation

## 2022-06-16 DIAGNOSIS — Z5112 Encounter for antineoplastic immunotherapy: Secondary | ICD-10-CM | POA: Diagnosis not present

## 2022-06-16 DIAGNOSIS — Z8261 Family history of arthritis: Secondary | ICD-10-CM | POA: Insufficient documentation

## 2022-06-16 DIAGNOSIS — Z79620 Long term (current) use of immunosuppressive biologic: Secondary | ICD-10-CM | POA: Diagnosis not present

## 2022-06-16 DIAGNOSIS — Z902 Acquired absence of lung [part of]: Secondary | ICD-10-CM | POA: Insufficient documentation

## 2022-06-16 DIAGNOSIS — R946 Abnormal results of thyroid function studies: Secondary | ICD-10-CM | POA: Diagnosis not present

## 2022-06-16 DIAGNOSIS — Z87891 Personal history of nicotine dependence: Secondary | ICD-10-CM | POA: Diagnosis not present

## 2022-06-16 LAB — CBC WITH DIFFERENTIAL (CANCER CENTER ONLY)
Abs Immature Granulocytes: 0 10*3/uL (ref 0.00–0.07)
Basophils Absolute: 0 10*3/uL (ref 0.0–0.1)
Basophils Relative: 0 %
Eosinophils Absolute: 0.1 10*3/uL (ref 0.0–0.5)
Eosinophils Relative: 3 %
HCT: 34.7 % — ABNORMAL LOW (ref 36.0–46.0)
Hemoglobin: 11.5 g/dL — ABNORMAL LOW (ref 12.0–15.0)
Immature Granulocytes: 0 %
Lymphocytes Relative: 34 %
Lymphs Abs: 1.6 10*3/uL (ref 0.7–4.0)
MCH: 30.3 pg (ref 26.0–34.0)
MCHC: 33.1 g/dL (ref 30.0–36.0)
MCV: 91.3 fL (ref 80.0–100.0)
Monocytes Absolute: 0.3 10*3/uL (ref 0.1–1.0)
Monocytes Relative: 5 %
Neutro Abs: 2.8 10*3/uL (ref 1.7–7.7)
Neutrophils Relative %: 58 %
Platelet Count: 217 10*3/uL (ref 150–400)
RBC: 3.8 MIL/uL — ABNORMAL LOW (ref 3.87–5.11)
RDW: 12.1 % (ref 11.5–15.5)
WBC Count: 4.8 10*3/uL (ref 4.0–10.5)
nRBC: 0 % (ref 0.0–0.2)

## 2022-06-16 LAB — CMP (CANCER CENTER ONLY)
ALT: 10 U/L (ref 0–44)
AST: 18 U/L (ref 15–41)
Albumin: 3.8 g/dL (ref 3.5–5.0)
Alkaline Phosphatase: 112 U/L (ref 38–126)
Anion gap: 7 (ref 5–15)
BUN: 12 mg/dL (ref 8–23)
CO2: 24 mmol/L (ref 22–32)
Calcium: 9.1 mg/dL (ref 8.9–10.3)
Chloride: 101 mmol/L (ref 98–111)
Creatinine: 0.69 mg/dL (ref 0.44–1.00)
GFR, Estimated: >60 mL/min (ref >60)
Glucose, Bld: 131 mg/dL — ABNORMAL HIGH (ref 70–99)
Potassium: 4 mmol/L (ref 3.5–5.1)
Sodium: 132 mmol/L — ABNORMAL LOW (ref 135–145)
Total Bilirubin: 0.5 mg/dL (ref 0.3–1.2)
Total Protein: 7 g/dL (ref 6.5–8.1)

## 2022-06-16 LAB — MAGNESIUM: Magnesium: 2 mg/dL (ref 1.7–2.4)

## 2022-06-16 LAB — TSH: TSH: 4.115 u[IU]/mL (ref 0.350–4.500)

## 2022-06-16 MED ORDER — SODIUM CHLORIDE 0.9 % IV SOLN
Freq: Once | INTRAVENOUS | Status: AC
  Filled 2022-06-16: qty 250

## 2022-06-16 MED ORDER — HEPARIN SOD (PORK) LOCK FLUSH 100 UNIT/ML IV SOLN
500.0000 [IU] | Freq: Once | INTRAVENOUS | Status: AC | PRN
  Administered 2022-06-16: 500 [IU]
  Filled 2022-06-16: qty 5

## 2022-06-16 MED ORDER — HEPARIN SOD (PORK) LOCK FLUSH 100 UNIT/ML IV SOLN
INTRAVENOUS | Status: AC
  Filled 2022-06-16: qty 5

## 2022-06-16 MED ORDER — SODIUM CHLORIDE 0.9 % IV SOLN
200.0000 mg | Freq: Once | INTRAVENOUS | Status: AC
  Administered 2022-06-16: 200 mg via INTRAVENOUS
  Filled 2022-06-16: qty 8

## 2022-06-16 NOTE — Patient Instructions (Signed)
[facility]  Discharge Instructions: Thank you for choosing [facility] to provide your oncology and hematology care.  If you have a lab appointment with the Cancer Center, please go directly to the Cancer Center and check in at the registration area.  Wear comfortable clothing and clothing appropriate for easy access to any Portacath or PICC line.   We strive to give you quality time with your provider. You may need to reschedule your appointment if you arrive late (15 or more minutes).  Arriving late affects you and other patients whose appointments are after yours.  Also, if you miss three or more appointments without notifying the office, you may be dismissed from the clinic at the provider's discretion.      For prescription refill requests, have your pharmacy contact our office and allow 72 hours for refills to be completed.    Today you received the following chemotherapy and/or immunotherapy agents- Keytruda      To help prevent nausea and vomiting after your treatment, we encourage you to take your nausea medication as directed.  BELOW ARE SYMPTOMS THAT SHOULD BE REPORTED IMMEDIATELY: *FEVER GREATER THAN 100.4 F (38 C) OR HIGHER *CHILLS OR SWEATING *NAUSEA AND VOMITING THAT IS NOT CONTROLLED WITH YOUR NAUSEA MEDICATION *UNUSUAL SHORTNESS OF BREATH *UNUSUAL BRUISING OR BLEEDING *URINARY PROBLEMS (pain or burning when urinating, or frequent urination) *BOWEL PROBLEMS (unusual diarrhea, constipation, pain near the anus) TENDERNESS IN MOUTH AND THROAT WITH OR WITHOUT PRESENCE OF ULCERS (sore throat, sores in mouth, or a toothache) UNUSUAL RASH, SWELLING OR PAIN  UNUSUAL VAGINAL DISCHARGE OR ITCHING   Items with * indicate a potential emergency and should be followed up as soon as possible or go to the Emergency Department if any problems should occur.  Please show the CHEMOTHERAPY ALERT CARD or IMMUNOTHERAPY ALERT CARD at check-in to  the Emergency Department and triage nurse.  Should you have questions after your visit or need to cancel or reschedule your appointment, please contact [facility] [place] [facility]  [phone] and follow the prompts.  Office hours are 8:00 a.m. to 4:30 p.m. Monday - Friday. Please note that voicemails left after 4:00 p.m. may not be returned until the following business day.  We are closed weekends and major holidays. You have access to a nurse at all times for urgent questions. Please call the main number to the clinic [phone] and follow the prompts.  For any non-urgent questions, you may also contact your provider using MyChart. We now offer e-Visits for anyone 18 and older to request care online for non-urgent symptoms. For details visit [email or web address].   Also download the MyChart app! Go to the app store, search "MyChart", open the app, select [place], and log in with your MyChart username and password.   Pembrolizumab Injection What is this medication? PEMBROLIZUMAB (PEM broe LIZ ue mab) treats some types of cancer. It works by helping your immune system slow or stop the spread of cancer cells. It is a monoclonal antibody. This medicine may be used for other purposes; ask your health care provider or pharmacist if you have questions. COMMON BRAND NAME(S): Keytruda What should I tell my care team before I take this medication? They need to know if you have any of these conditions: Allogeneic stem cell transplant (uses someone else's stem cells) Autoimmune diseases, such as Crohn disease, ulcerative colitis, lupus History of chest radiation Nervous system problems, such as Guillain-Barre syndrome, myasthenia   gravis Organ transplant An unusual or allergic reaction to pembrolizumab, other medications, foods, dyes, or preservatives Pregnant or trying to get pregnant Breast-feeding How should I use this medication? This medication is injected into a vein.  It is given by your care team in a hospital or clinic setting. A special MedGuide will be given to you before each treatment. Be sure to read this information carefully each time. Talk to your care team about the use of this medication in children. While it may be prescribed for children as young as 6 months for selected conditions, precautions do apply. Overdosage: If you think you have taken too much of this medicine contact a poison control center or emergency room at once. NOTE: This medicine is only for you. Do not share this medicine with others. What if I miss a dose? Keep appointments for follow-up doses. It is important not to miss your dose. Call your care team if you are unable to keep an appointment. What may interact with this medication? Interactions have not been studied. This list may not describe all possible interactions. Give your health care provider a list of all the medicines, herbs, non-prescription drugs, or dietary supplements you use. Also tell them if you smoke, drink alcohol, or use illegal drugs. Some items may interact with your medicine. What should I watch for while using this medication? Your condition will be monitored carefully while you are receiving this medication. You may need blood work while taking this medication. This medication may cause serious skin reactions. They can happen weeks to months after starting the medication. Contact your care team right away if you notice fevers or flu-like symptoms with a rash. The rash may be red or purple and then turn into blisters or peeling of the skin. You may also notice a red rash with swelling of the face, lips, or lymph nodes in your neck or under your arms. Tell your care team right away if you have any change in your eyesight. Talk to your care team if you may be pregnant. Serious birth defects can occur if you take this medication during pregnancy and for 4 months after the last dose. You will need a negative  pregnancy test before starting this medication. Contraception is recommended while taking this medication and for 4 months after the last dose. Your care team can help you find the option that works for you. Do not breastfeed while taking this medication and for 4 months after the last dose. What side effects may I notice from receiving this medication? Side effects that you should report to your care team as soon as possible: Allergic reactions--skin rash, itching, hives, swelling of the face, lips, tongue, or throat Dry cough, shortness of breath or trouble breathing Eye pain, redness, irritation, or discharge with blurry or decreased vision Heart muscle inflammation--unusual weakness or fatigue, shortness of breath, chest pain, fast or irregular heartbeat, dizziness, swelling of the ankles, feet, or hands Hormone gland problems--headache, sensitivity to light, unusual weakness or fatigue, dizziness, fast or irregular heartbeat, increased sensitivity to cold or heat, excessive sweating, constipation, hair loss, increased thirst or amount of urine, tremors or shaking, irritability Infusion reactions--chest pain, shortness of breath or trouble breathing, feeling faint or lightheaded Kidney injury (glomerulonephritis)--decrease in the amount of urine, red or dark brown urine, foamy or bubbly urine, swelling of the ankles, hands, or feet Liver injury--right upper belly pain, loss of appetite, nausea, light-colored stool, dark yellow or brown urine, yellowing skin or eyes, unusual   weakness or fatigue Pain, tingling, or numbness in the hands or feet, muscle weakness, change in vision, confusion or trouble speaking, loss of balance or coordination, trouble walking, seizures Rash, fever, and swollen lymph nodes Redness, blistering, peeling, or loosening of the skin, including inside the mouth Sudden or severe stomach pain, bloody diarrhea, fever, nausea, vomiting Side effects that usually do not require  medical attention (report to your care team if they continue or are bothersome): Bone, joint, or muscle pain Diarrhea Fatigue Loss of appetite Nausea Skin rash This list may not describe all possible side effects. Call your doctor for medical advice about side effects. You may report side effects to FDA at 1-800-FDA-1088. Where should I keep my medication? This medication is given in a hospital or clinic. It will not be stored at home. NOTE: This sheet is a summary. It may not cover all possible information. If you have questions about this medicine, talk to your doctor, pharmacist, or health care provider.  2023 [employer]/Gold Standard (2021-07-02 00:00:00)  

## 2022-06-16 NOTE — Progress Notes (Signed)
[name] CONSULT NOTE  Patient Care Team: Tower, [name] Gallus, MD as PCP - General [name] Buff, RN as Oncology Nurse Navigator [name] Coder, MD as Consulting Physician (Internal Medicine)  CHIEF COMPLAINTS/PURPOSE OF CONSULTATION: lung cancer  #  Oncology History Overview Note  JULY 2023- 1. 10 mm mean diameter nodule in the left upper lobe adjacent to an area of pleural and parenchymal scarring.     SEP 2023- 1. Hypermetabolic spiculated nodular left upper lobe pulmonary lesion is compatible with primary bronchogenic neoplasm. 2. Mildly metabolic nodularity in the left hilum, without discrete adenopathy may reflect early nodal disease involvement. Suggest attention on follow-up imaging. 3. No evidence of distant hypermetabolic metastatic disease. 4. Hypermetabolic nodule in the left lobe of the thyroid, Recommend thyroid US and biopsy (ref: J Am Coll Radiol. 2015 Feb;12(2): 143-50). 5. Aortic Atherosclerosis (ICD10-I70.0) and Emphysema (ICD10-J43.9).  # LUNG: Resection  Synchronous Tumors: Not applicable Total Number of Primary Tumors: 1 Procedure: Lobectomy, lung Specimen Laterality: Left Tumor Focality: Unifocal Tumor Site: Upper lobe Tumor Size: 2.6 cm Histologic Type: Squamous cell carcinoma, moderately differentiated Visceral Pleura Invasion: Not identified Direct Invasion of Adjacent Structures: No adjacent structures present Lymphovascular Invasion: Not identified Margins: All margins negative for invasive carcinoma      Closest Margin(s) to Invasive Carcinoma: Bronchovascular margin      Margin(s) Involved by Invasive Carcinoma: Not applicable       Margin Status for Non-Invasive Tumor: Negative Treatment Effect: No known presurgical therapy Regional Lymph Nodes:      Number of Lymph Nodes Involved: 1                           Nodal Sites with Tumor: Level 10      Number of Lymph Nodes Examined: 21                      Nodal Sites  Examined: Levels 4, 5, 7, 9, 10, 11, 12 and 13 Distant Metastasis:      Distant Site(s) Involved: Not applicable Pathologic Stage Classification (pTNM, AJCC 8th Edition): pT1c, pN1 Ancillary Studies: Can be performed upon request Representative Tumor Block: B5 Comment(s): None  # DEC 14th, 2023- Cisplatin-Taxotere #1; D-2 [name] [name] x4 cycles [finished FEB 2024]  # April 15th, 2024- Keytruda q 3 W x1 year    Lung cancer  12/05/2021 Initial Diagnosis   Lung cancer (HCC)   12/18/2021 Cancer Staging   Staging form: Lung, AJCC 8th Edition - Pathologic stage from 12/18/2021: Stage IIB (pT1c, pN1, cM0) - Signed by [name] Slot, MD on 12/18/2021 Histopathologic type: Squamous cell carcinoma, NOS   Cancer of upper lobe of left lung  01/03/2022 Initial Diagnosis   Cancer of upper lobe of left lung (HCC)   01/03/2022 Cancer Staging   Staging form: Lung, AJCC 8th Edition - Pathologic: Stage IIB (pT1c, pN1, cM0) - Signed by [name] Coder, MD on 01/03/2022   02/13/2022 - 04/18/2022 Chemotherapy   Patient is on Treatment Plan : LUNG Cisplatin + Docetaxel q21d     06/16/2022 -  Chemotherapy   Patient is on Treatment Plan : LUNG NSCLC Pembrolizumab (200) q21d      HISTORY OF PRESENTING ILLNESS: Ambulating independently.  Alone.   [name] [name] [name] 72 y.o.  female history of smoking with stage II T2 N1 squamous cell carcinoma left lung cancer currently s/p adjuvant chemotherapy -is here to proceed with adjuvant chemotherapy.  In the interim patient was evaluated by vascular surgery for thoracic aneurysm.  Denies any significant leg swelling.  Denies any tingling or numbness.  Denies any worsening pain.  Denies any worsening shortness of breath.  Review of Systems  Constitutional:  Negative for chills, diaphoresis, fever, malaise/fatigue and weight loss.  HENT:  Negative for nosebleeds and sore throat.   Eyes:  Negative for double vision.  Respiratory:  Negative for cough,  hemoptysis, sputum production, shortness of breath and wheezing.   Cardiovascular:  Negative for chest pain, palpitations, orthopnea and leg swelling.  Gastrointestinal:  Negative for abdominal pain, blood in stool, constipation, diarrhea, heartburn, melena, nausea and vomiting.  Genitourinary:  Negative for dysuria, frequency and urgency.  Musculoskeletal:  Negative for back pain and joint pain.  Skin: Negative.  Negative for itching and rash.  Neurological:  Negative for dizziness, tingling, focal weakness, weakness and headaches.  Endo/Heme/Allergies:  Does not bruise/bleed easily.  Psychiatric/Behavioral:  Negative for depression. The patient is not nervous/anxious and does not have insomnia.      MEDICAL HISTORY:  Past Medical History:  Diagnosis Date   Arthritis    OA   Carpal tunnel syndrome    Depression    Endometriosis    Fibromyalgia    Hyperlipidemia    Left upper lobe pulmonary nodule 11/2021   Post herpetic neuralgia    Sleep disorder    Tobacco abuse     SURGICAL HISTORY: Past Surgical History:  Procedure Laterality Date   ABDOMINAL HYSTERECTOMY  1992   total endometriosis   APPENDECTOMY  1992   bladder tack  1992   CATARACT EXTRACTION W/PHACO Right 03/25/2018   Procedure: CATARACT EXTRACTION PHACO AND INTRAOCULAR LENS PLACEMENT (IOC) RIGHT;  Surgeon: [name] Cousin, MD;  Location: [facility] ORS;  Service: Ophthalmology;  Laterality: Right;  [place]  01:11 CDE 8.02 Fluid pack lot # [id number]   CATARACT EXTRACTION W/PHACO Left 04/22/2018   Procedure: CATARACT EXTRACTION PHACO AND INTRAOCULAR LENS PLACEMENT (IOC) Left Eye;  Surgeon: [name] Cousin, MD;  Location: [facility] OR[facility];  Service: Ophthalmology;  Laterality: Left;  [place]  01:02 CDE 8.11 Fluid pack lot # [id number]   COLONOSCOPY     FOOT SURGERY Right 04/2016   [name] in [place] great toe; straightening others   INTERCOSTAL NERVE BLOCK Left 12/12/2021   Procedure: INTERCOSTAL NERVE BLOCK;  Surgeon: [name] Slot, MD;  Location: [name];  Service: Thoracic;  Laterality: Left;   IR IMAGING GUIDED PORT INSERTION  02/27/2022   LYMPH NODE DISSECTION Left 12/12/2021   Procedure: LYMPH NODE DISSECTION;  Surgeon: [name] Slot, MD;  Location: [facility] OR;  Service: Thoracic;  Laterality: Left;   VIDEO BRONCHOSCOPY N/A 12/16/2021   Procedure: VIDEO BRONCHOSCOPY;  Surgeon: [name] Slot, MD;  Location: [facility] OR;  Service: Thoracic;  Laterality: N/A;    SOCIAL HISTORY: Social History   Socioeconomic History   Marital status: Widowed    Spouse name: Not on file   Number of children: 2   Years of education: Not on file   Highest education level: Not on file  Occupational History   Not on file  Tobacco Use   Smoking status: Former    Packs/day: 1.00    Years: 55.00    Additional pack years: 0.00    Total pack years: 55.00    Types: Cigarettes    Quit date: 12/03/2021    Years since quitting: 0.5   Smokeless tobacco: Never  Vaping Use   Vaping  Use: Some days  Substance and Sexual Activity   Alcohol use: No    Alcohol/week: 0.0 standard drinks of alcohol   Drug use: No   Sexual activity: Not Currently  Other Topics Concern   Not on file  Social History Narrative   Lives alone   Social Determinants of Health   Financial Resource Strain: Low Risk  (04/16/2022)   Overall Financial Resource Strain (CARDIA)    Difficulty of Paying Living Expenses: Not hard at all  Food Insecurity: No Food Insecurity (04/16/2022)   Hunger Vital Sign    Worried About Running Out of Food in the Last Year: Never true    Ran Out of Food in the Last Year: Never true  Transportation Needs: No Transportation Needs (04/16/2022)   PRAPARE - Administrator, Civil Service (Medical): No    Lack of Transportation (Non-Medical): No  Physical Activity: Inactive (04/16/2022)   Exercise Vital Sign    Days of Exercise per Week: 0 days    Minutes of Exercise per Session: 0 min  Stress: No Stress Concern  Present (04/16/2022)   [employer] of Occupational Health - Occupational Stress Questionnaire    Feeling of Stress : Not at all  Social Connections: Moderately Integrated (04/16/2022)   Social Connection and Isolation Panel [NHANES]    Frequency of Communication with Friends and Family: More than three times a week    Frequency of Social Gatherings with Friends and Family: More than three times a week    Attends Religious Services: More than 4 times per year    Active Member of [employer] or Organizations: Yes    Attends Banker Meetings: More than 4 times per year    Marital Status: Widowed  Intimate Partner Violence: Not At Risk (04/16/2022)   Humiliation, Afraid, Rape, and Kick questionnaire    Fear of Current or Ex-Partner: No    Emotionally Abused: No    Physically Abused: No    Sexually Abused: No    FAMILY HISTORY: Family History  Problem Relation Age of Onset   Heart disease Mother        MI   Heart disease Father        MI   Arthritis Sister        crippling arthritis   Arthritis Brother        crippling arthritis    ALLERGIES:  has No Known Allergies.  MEDICATIONS:  Current Outpatient Medications  Medication Sig Dispense Refill   acetaminophen (TYLENOL) 500 MG tablet Take 500 mg by mouth every 6 (six) hours as needed.     aspirin 81 MG tablet Take 81 mg by mouth daily.     benzonatate (TESSALON) 200 MG capsule Take 1 capsule (200 mg total) by mouth 3 (three) times daily as needed for cough. Swallow whole 30 capsule 1   busPIRone (BUSPAR) 15 MG tablet Take 0.5 tablets (7.5 mg total) by mouth 2 (two) times daily. 90 tablet 2   Cobalamin Combinations (NEURIVA PLUS PO) Take by mouth.     dexamethasone (DECADRON) 4 MG tablet Take 1 tablet (4 mg total) by mouth 2 (two) times daily with a meal. Start 2 days prior to chemo. Take for 2 days; DO NOT take on the day of chemo. 60 tablet 0   docusate sodium (COLACE) 100 MG capsule Take 200 mg by mouth daily.      DULoxetine (CYMBALTA) 60 MG capsule TAKE 1 CAPSULE BY MOUTH EVERY DAY 90 capsule  1   Fluticasone-Umeclidin-Vilant (TRELEGY ELLIPTA) 100-62.5-25 MCG/ACT AEPB Inhale 1 puff into the lungs daily. 28 each 11   lidocaine-prilocaine (EMLA) cream Apply on the port. 30 -45 min  prior to port access. 30 g 3   magic mouthwash (nystatin, lidocaine, diphenhydrAMINE, alum & mag hydroxide) suspension Swish and spit 5 mLs 4 (four) times daily as needed for mouth pain. 240 mL 1   MAGNESIUM CITRATE PO Take by mouth daily.     Melatonin 10 MG CAPS Take 20 mg by mouth at bedtime.     naproxen sodium (ALEVE) 220 MG tablet Take 440 mg by mouth daily as needed (headaches).     nystatin (MYCOSTATIN) 100000 UNIT/ML suspension Take 5 mLs (500,000 Units total) by mouth 4 (four) times daily. 60 mL 0   ondansetron (ZOFRAN) 8 MG tablet One pill every 8 hours as needed for nausea/vomitting. 40 tablet 1   pregabalin (LYRICA) 75 MG capsule TAKE 1 CAPSULE BY MOUTH TWICE A DAY 180 capsule 1   prochlorperazine (COMPAZINE) 10 MG tablet Take 1 tablet (10 mg total) by mouth every 6 (six) hours as needed for nausea or vomiting. 40 tablet 1   senna (SENOKOT) 8.6 MG tablet Take 1 tablet (8.6 mg total) by mouth 2 (two) times daily as needed for constipation. 14 tablet 0   traMADol (ULTRAM) 50 MG tablet Take 1 tablet (50 mg total) by mouth every 8 (eight) hours as needed. 90 tablet 0   amoxicillin-clavulanate (AUGMENTIN) 875-125 MG tablet Take 1 tablet by mouth 2 (two) times daily. (Patient not taking: Reported on 06/16/2022) 20 tablet 0   No current facility-administered medications for this visit.   Facility-Administered Medications Ordered in Other Visits  Medication Dose Route Frequency Provider Last Rate Last Admin   0.9 %  sodium chloride infusion   Intravenous Continuous Rushie Chestnut, PA-C   Stopped at 02/17/22 1214   0.9 %  sodium chloride infusion   Intravenous Once Louretta Shorten R, MD       heparin lock flush 100  unit/mL  500 Units Intracatheter Once PRN [name] Coder, MD       pembrolizumab [facility]) 200 mg in sodium chloride 0.9 % 50 mL chemo infusion  200 mg Intravenous Once Louretta Shorten R, MD       sodium chloride flush (NS) 0.9 % injection 10 mL  10 mL Intravenous Once Alinda Dooms, NP          .  PHYSICAL EXAMINATION: ECOG PERFORMANCE STATUS: 0 - Asymptomatic  Vitals:   06/16/22 1511  BP: 137/84  Pulse: 80  Resp: 17  Temp: 97.8 F (36.6 C)  SpO2: 100%    Filed Weights   06/16/22 1511  Weight: 179 lb (81.2 kg)     Physical Exam Vitals and nursing note reviewed.  Constitutional:      Comments:     HENT:     Head: Normocephalic and atraumatic.     Mouth/Throat:     Mouth: Mucous membranes are moist.     Pharynx: No oropharyngeal exudate.  Eyes:     Pupils: Pupils are equal, round, and reactive to light.  Cardiovascular:     Rate and Rhythm: Normal rate and regular rhythm.  Pulmonary:     Effort: No respiratory distress.     Breath sounds: No wheezing.     Comments: Decreased breath sounds bilaterally at bases.  No wheeze or crackles Abdominal:     General: Bowel sounds are normal. There  is no distension.     Palpations: Abdomen is soft. There is no mass.     Tenderness: There is no abdominal tenderness. There is no guarding or rebound.  Musculoskeletal:        General: No tenderness. Normal range of motion.     Cervical back: Normal range of motion and neck supple.  Skin:    General: Skin is warm.  Neurological:     Mental Status: She is alert and oriented to person, place, and time.  Psychiatric:        Mood and Affect: Affect normal.        Judgment: Judgment normal.      LABORATORY DATA:  I have reviewed the data as listed Lab Results  Component Value Date   WBC 4.8 06/16/2022   HGB 11.5 (L) 06/16/2022   HCT 34.7 (L) 06/16/2022   MCV 91.3 06/16/2022   PLT 217 06/16/2022   Recent Labs    03/31/22 0951 04/02/22 0929  04/16/22 0826 04/23/22 0959 05/21/22 0921  NA 133*   < > 133* 131* 134*  K 3.1*   < > 4.3 4.4 4.1  CL 94*   < > 103 100 104  CO2 29   < > 23 24 24   GLUCOSE 129*   < > 123* 112* 130*  BUN 21   < > 14 15 11   CREATININE 0.76   < > 0.58 0.73 0.73  CALCIUM 9.1   < > 9.2 8.7* 9.3  GFRNONAA >60   < > >60 >60 >60  PROT 6.9  --  6.5  --  6.6  ALBUMIN 3.7  --  3.5  --  3.6  AST 23  --  18  --  18  ALT 19  --  12  --  9  ALKPHOS 128*  --  99  --  103  BILITOT 1.1  --  0.3  --  0.6   < > = values in this interval not displayed.    RADIOGRAPHIC STUDIES: I have personally reviewed the radiological images as listed and agreed with the findings in the report. No results found.  ASSESSMENT & PLAN:   Cancer of upper lobe of left lung (HCC) # Left upper lobe-lung cancer moderately differentiated squamous cell carcinoma-pT1pN1- [IIB]; PDL-1 =70%. EGFR status-WILD.  Adjuvant chemotherapy-cisplatin Taxotere x 4 cycles; followed by adjuvant Pembrolizumab x1 year.  s/p 4 cycles of Cis-Taxotere-CT scan March 2024 maturing posttreatment changes from left sided left upper lobe resection with volume loss. Small left effusion. No new developing mass lesion, lymph node enlargement.  # proceed with adjuvant therapy-Keytruda cycle #1 for total of 1 year.  I discussed the mechanism of action. Discussed the potential side effects of immunotherapy including but not limited to diarrhea; skin rash; elevated LFTs/endocrine abnormalities etc.  # PN G-1 -2; from Taxotere.  Monitor for now. Stable.   # Abnormal thyroid function in 2023- await tyroid labs from today. Ok to proceed with [place] today.   # Incidental CT , 2024: saccular aneurysm focally along the lateral aspect of the aortic arch [s/p Dr.Dew]-continue surveillance with imaging as lung cancer.  # IV Access: port-functional.  # Ongoing fatigue : From chemotherapy recommend evaluation with CARE program. Stable.   pt requesting AM appts-  #  DISPOSITION:  # keytruda today- awaiat labs; ok to proceed without TSH # Follow up in 3 weeks- MD;port- labs- cbc/cmp;mag; Rande Lawman- Dr.B  All questions were answered. The patient knows to call  the clinic with any problems, questions or concerns.    [name] Coder, MD 06/16/2022 3:38 PM

## 2022-06-16 NOTE — Progress Notes (Signed)
Patient here for oncology follow-up appointment, expresses no new concerns at this time.    

## 2022-06-16 NOTE — Assessment & Plan Note (Addendum)
#   Left upper lobe-lung cancer moderately differentiated squamous cell carcinoma-pT1pN1- [IIB]; PDL-1 =70%. EGFR status-WILD.  Adjuvant chemotherapy-cisplatin Taxotere x 4 cycles; followed by adjuvant Pembrolizumab x1 year.  s/p 4 cycles of Cis-Taxotere-CT scan March 2024 maturing posttreatment changes from left sided left upper lobe resection with volume loss. Small left effusion. No new developing mass lesion, lymph node enlargement.  # proceed with adjuvant therapy-Keytruda cycle #1 for total of 1 year.  I discussed the mechanism of action. Discussed the potential side effects of immunotherapy including but not limited to diarrhea; skin rash; elevated LFTs/endocrine abnormalities etc.  # PN G-1 -2; from Taxotere.  Monitor for now. Stable.   # Abnormal thyroid function in 2023- await tyroid labs from today. Ok to proceed with [place] today.   # Incidental CT , 2024: saccular aneurysm focally along the lateral aspect of the aortic arch [s/p Dr.Dew]-continue surveillance with imaging as lung cancer.  # IV Access: port-functional.  # Ongoing fatigue : From chemotherapy recommend evaluation with CARE program. Stable.   pt requesting AM appts-  # DISPOSITION:  # keytruda today- awaiat labs; ok to proceed without TSH # Follow up in 3 weeks- MD;port- labs- cbc/cmp;mag; Keytruda- Dr.B

## 2022-06-17 LAB — T4: T4, Total: 7.1 ug/dL (ref 4.5–12.0)

## 2022-06-24 ENCOUNTER — Encounter: Payer: Self-pay | Admitting: Internal Medicine

## 2022-07-07 ENCOUNTER — Inpatient Hospital Stay: Payer: Medicare Other

## 2022-07-07 ENCOUNTER — Inpatient Hospital Stay: Payer: Medicare Other | Attending: Internal Medicine | Admitting: Internal Medicine

## 2022-07-07 ENCOUNTER — Encounter: Payer: Self-pay | Admitting: Internal Medicine

## 2022-07-07 VITALS — BP 135/60 | HR 75 | Temp 97.8°F | Resp 17 | Ht 65.0 in | Wt 176.6 lb

## 2022-07-07 DIAGNOSIS — Z79620 Long term (current) use of immunosuppressive biologic: Secondary | ICD-10-CM | POA: Insufficient documentation

## 2022-07-07 DIAGNOSIS — C3412 Malignant neoplasm of upper lobe, left bronchus or lung: Secondary | ICD-10-CM

## 2022-07-07 DIAGNOSIS — M797 Fibromyalgia: Secondary | ICD-10-CM | POA: Diagnosis not present

## 2022-07-07 DIAGNOSIS — Z5112 Encounter for antineoplastic immunotherapy: Secondary | ICD-10-CM | POA: Diagnosis not present

## 2022-07-07 DIAGNOSIS — R059 Cough, unspecified: Secondary | ICD-10-CM | POA: Diagnosis not present

## 2022-07-07 DIAGNOSIS — G629 Polyneuropathy, unspecified: Secondary | ICD-10-CM | POA: Insufficient documentation

## 2022-07-07 LAB — CBC WITH DIFFERENTIAL (CANCER CENTER ONLY)
Abs Immature Granulocytes: 0.02 10*3/uL (ref 0.00–0.07)
Basophils Absolute: 0 10*3/uL (ref 0.0–0.1)
Basophils Relative: 0 %
Eosinophils Absolute: 0.2 10*3/uL (ref 0.0–0.5)
Eosinophils Relative: 3 %
HCT: 36.6 % (ref 36.0–46.0)
Hemoglobin: 12.4 g/dL (ref 12.0–15.0)
Immature Granulocytes: 0 %
Lymphocytes Relative: 22 %
Lymphs Abs: 1.5 10*3/uL (ref 0.7–4.0)
MCH: 29.7 pg (ref 26.0–34.0)
MCHC: 33.9 g/dL (ref 30.0–36.0)
MCV: 87.6 fL (ref 80.0–100.0)
Monocytes Absolute: 0.5 10*3/uL (ref 0.1–1.0)
Monocytes Relative: 7 %
Neutro Abs: 4.6 10*3/uL (ref 1.7–7.7)
Neutrophils Relative %: 68 %
Platelet Count: 227 10*3/uL (ref 150–400)
RBC: 4.18 MIL/uL (ref 3.87–5.11)
RDW: 12.5 % (ref 11.5–15.5)
WBC Count: 6.8 10*3/uL (ref 4.0–10.5)
nRBC: 0 % (ref 0.0–0.2)

## 2022-07-07 LAB — MAGNESIUM: Magnesium: 2 mg/dL (ref 1.7–2.4)

## 2022-07-07 LAB — CMP (CANCER CENTER ONLY)
ALT: 10 U/L (ref 0–44)
AST: 18 U/L (ref 15–41)
Albumin: 3.7 g/dL (ref 3.5–5.0)
Alkaline Phosphatase: 120 U/L (ref 38–126)
Anion gap: 8 (ref 5–15)
BUN: 10 mg/dL (ref 8–23)
CO2: 23 mmol/L (ref 22–32)
Calcium: 9.7 mg/dL (ref 8.9–10.3)
Chloride: 103 mmol/L (ref 98–111)
Creatinine: 0.53 mg/dL (ref 0.44–1.00)
GFR, Estimated: >60 mL/min (ref >60)
Glucose, Bld: 115 mg/dL — ABNORMAL HIGH (ref 70–99)
Potassium: 4.1 mmol/L (ref 3.5–5.1)
Sodium: 134 mmol/L — ABNORMAL LOW (ref 135–145)
Total Bilirubin: 0.6 mg/dL (ref 0.3–1.2)
Total Protein: 7.2 g/dL (ref 6.5–8.1)

## 2022-07-07 MED ORDER — HEPARIN SOD (PORK) LOCK FLUSH 100 UNIT/ML IV SOLN
500.0000 [IU] | Freq: Once | INTRAVENOUS | Status: AC | PRN
  Administered 2022-07-07: 500 [IU]
  Filled 2022-07-07: qty 5

## 2022-07-07 MED ORDER — SODIUM CHLORIDE 0.9 % IV SOLN
Freq: Once | INTRAVENOUS | Status: AC
  Filled 2022-07-07: qty 250

## 2022-07-07 MED ORDER — SODIUM CHLORIDE 0.9 % IV SOLN
200.0000 mg | Freq: Once | INTRAVENOUS | Status: AC
  Administered 2022-07-07: 200 mg via INTRAVENOUS
  Filled 2022-07-07: qty 8

## 2022-07-07 MED ORDER — HYDROXYZINE HCL 10 MG PO TABS: 10.0000 mg | ORAL_TABLET | Freq: Every evening | ORAL | 0 refills | Status: DC | PRN

## 2022-07-07 NOTE — Progress Notes (Signed)
Cancer Center CONSULT NOTE  Patient Care Team: Tower, Audrie Gallus, MD as PCP - General Glory Buff, RN as Oncology Nurse Navigator Earna Coder, MD as Consulting Physician (Internal Medicine)  CHIEF COMPLAINTS/PURPOSE OF CONSULTATION: lung cancer  #  Oncology History Overview Note  JULY 2023- 1. 10 mm mean diameter nodule in the left upper lobe adjacent to an area of pleural and parenchymal scarring.     SEP 2023- 1. Hypermetabolic spiculated nodular left upper lobe pulmonary lesion is compatible with primary bronchogenic neoplasm. 2. Mildly metabolic nodularity in the left hilum, without discrete adenopathy may reflect early nodal disease involvement. Suggest attention on follow-up imaging. 3. No evidence of distant hypermetabolic metastatic disease. 4. Hypermetabolic nodule in the left lobe of the thyroid, Recommend thyroid US and biopsy (ref: J Am Coll Radiol. 2015 Feb;12(2): 143-50). 5. Aortic Atherosclerosis (ICD10-I70.0) and Emphysema (ICD10-J43.9).  # LUNG: Resection  Synchronous Tumors: Not applicable Total Number of Primary Tumors: 1 Procedure: Lobectomy, lung Specimen Laterality: Left Tumor Focality: Unifocal Tumor Site: Upper lobe Tumor Size: 2.6 cm Histologic Type: Squamous cell carcinoma, moderately differentiated Visceral Pleura Invasion: Not identified Direct Invasion of Adjacent Structures: No adjacent structures present Lymphovascular Invasion: Not identified Margins: All margins negative for invasive carcinoma      Closest Margin(s) to Invasive Carcinoma: Bronchovascular margin      Margin(s) Involved by Invasive Carcinoma: Not applicable       Margin Status for Non-Invasive Tumor: Negative Treatment Effect: No known presurgical therapy Regional Lymph Nodes:      Number of Lymph Nodes Involved: 1                           Nodal Sites with Tumor: Level 10      Number of Lymph Nodes Examined: 21                      Nodal Sites  Examined: Levels 4, 5, 7, 9, 10, 11, 12 and 13 Distant Metastasis:      Distant Site(s) Involved: Not applicable Pathologic Stage Classification (pTNM, AJCC 8th Edition): pT1c, pN1 Ancillary Studies: Can be performed upon request Representative Tumor Block: B5 Comment(s): None  # DEC 14th, 2023- Cisplatin-Taxotere #1; D-2 Greggory Keen x4 cycles [finished FEB 2024]  # April 15th, 2024- Keytruda q 3 W x1 year    Lung cancer (HCC)  12/05/2021 Initial Diagnosis   Lung cancer (HCC)   12/18/2021 Cancer Staging   Staging form: Lung, AJCC 8th Edition - Pathologic stage from 12/18/2021: Stage IIB (pT1c, pN1, cM0) - Signed by Loreli Slot, MD on 12/18/2021 Histopathologic type: Squamous cell carcinoma, NOS   Cancer of upper lobe of left lung (HCC)  01/03/2022 Initial Diagnosis   Cancer of upper lobe of left lung (HCC)   01/03/2022 Cancer Staging   Staging form: Lung, AJCC 8th Edition - Pathologic: Stage IIB (pT1c, pN1, cM0) - Signed by Earna Coder, MD on 01/03/2022   02/13/2022 - 04/18/2022 Chemotherapy   Patient is on Treatment Plan : LUNG Cisplatin + Docetaxel q21d     06/16/2022 -  Chemotherapy   Patient is on Treatment Plan : LUNG NSCLC Pembrolizumab (200) q21d      HISTORY OF PRESENTING ILLNESS: Ambulating independently.  Alone.   [name] [name] 72 y.o.  female history of smoking with stage II T2 N1 squamous cell carcinoma left lung cancer currently s/p adjuvant chemotherapy -is here to proceed with  adjuvant  immunotherapy/keytruda.   Pt feels good. Has itching all over no rash. Suggested patient to try 1 benadryl capsule to see if it would alleviate the itching, which is worse at night. Started with the [place] infusions. Appetite is good. Hx fibromyalgia often has pain. None this morning.   Denies any significant leg swelling.  Denies any tingling or numbness.  Denies any worsening pain.  Denies any worsening shortness of breath.  Review of Systems   Constitutional:  Negative for chills, diaphoresis, fever, malaise/fatigue and weight loss.  HENT:  Negative for nosebleeds and sore throat.   Eyes:  Negative for double vision.  Respiratory:  Negative for cough, hemoptysis, sputum production, shortness of breath and wheezing.   Cardiovascular:  Negative for chest pain, palpitations, orthopnea and leg swelling.  Gastrointestinal:  Negative for abdominal pain, blood in stool, constipation, diarrhea, heartburn, melena, nausea and vomiting.  Genitourinary:  Negative for dysuria, frequency and urgency.  Musculoskeletal:  Negative for back pain and joint pain.  Skin: Negative.  Negative for itching and rash.  Neurological:  Negative for dizziness, tingling, focal weakness, weakness and headaches.  Endo/Heme/Allergies:  Does not bruise/bleed easily.  Psychiatric/Behavioral:  Negative for depression. The patient is not nervous/anxious and does not have insomnia.      MEDICAL HISTORY:  Past Medical History:  Diagnosis Date   Arthritis    OA   Carpal tunnel syndrome    Depression    Endometriosis    Fibromyalgia    Hyperlipidemia    Left upper lobe pulmonary nodule 11/2021   Post herpetic neuralgia    Sleep disorder    Tobacco abuse     SURGICAL HISTORY: Past Surgical History:  Procedure Laterality Date   ABDOMINAL HYSTERECTOMY  1992   total endometriosis   APPENDECTOMY  1992   bladder tack  1992   CATARACT EXTRACTION W/PHACO Right 03/25/2018   Procedure: CATARACT EXTRACTION PHACO AND INTRAOCULAR LENS PLACEMENT (IOC) RIGHT;  Surgeon: Elliot Cousin, MD;  Location: [facility] ORS;  Service: Ophthalmology;  Laterality: Right;  [place]  01:11 CDE 8.02 Fluid pack lot # [id number]   CATARACT EXTRACTION W/PHACO Left 04/22/2018   Procedure: CATARACT EXTRACTION PHACO AND INTRAOCULAR LENS PLACEMENT (IOC) Left Eye;  Surgeon: Elliot Cousin, MD;  Location: [facility] ORS;  Service: Ophthalmology;  Laterality: Left;  [place]  01:02 CDE 8.11 Fluid pack lot # [id number]    COLONOSCOPY     FOOT SURGERY Right 04/2016   [facility] in [place] great toe; straightening others   INTERCOSTAL NERVE BLOCK Left 12/12/2021   Procedure: INTERCOSTAL NERVE BLOCK;  Surgeon: Loreli Slot, MD;  Location: [facility] OR;  Service: Thoracic;  Laterality: Left;   IR IMAGING GUIDED PORT INSERTION  02/27/2022   LYMPH NODE DISSECTION Left 12/12/2021   Procedure: LYMPH NODE DISSECTION;  Surgeon: Loreli Slot, MD;  Location: [facility] OR;  Service: Thoracic;  Laterality: Left;   VIDEO BRONCHOSCOPY N/A 12/16/2021   Procedure: VIDEO BRONCHOSCOPY;  Surgeon: Loreli Slot, MD;  Location: [facility] OR;  Service: Thoracic;  Laterality: N/A;    SOCIAL HISTORY: Social History   Socioeconomic History   Marital status: Widowed    Spouse name: Not on file   Number of children: 2   Years of education: Not on file   Highest education level: Not on file  Occupational History   Not on file  Tobacco Use   Smoking status: Former    Packs/day: 1.00    Years: 55.00    Additional  pack years: 0.00    Total pack years: 55.00    Types: Cigarettes    Quit date: 12/03/2021    Years since quitting: 0.5   Smokeless tobacco: Never  Vaping Use   Vaping Use: Some days  Substance and Sexual Activity   Alcohol use: No    Alcohol/week: 0.0 standard drinks of alcohol   Drug use: No   Sexual activity: Not Currently  Other Topics Concern   Not on file  Social History Narrative   Lives alone   Social Determinants of Health   Financial Resource Strain: Low Risk  (04/16/2022)   Overall Financial Resource Strain (CARDIA)    Difficulty of Paying Living Expenses: Not hard at all  Food Insecurity: No Food Insecurity (04/16/2022)   Hunger Vital Sign    Worried About Running Out of Food in the Last Year: Never true    Ran Out of Food in the Last Year: Never true  Transportation Needs: No Transportation Needs (04/16/2022)   PRAPARE - Administrator, Civil Service (Medical): No     Lack of Transportation (Non-Medical): No  Physical Activity: Inactive (04/16/2022)   Exercise Vital Sign    Days of Exercise per Week: 0 days    Minutes of Exercise per Session: 0 min  Stress: No Stress Concern Present (04/16/2022)   [employer] of Occupational Health - Occupational Stress Questionnaire    Feeling of Stress : Not at all  Social Connections: Moderately Integrated (04/16/2022)   Social Connection and Isolation Panel [NHANES]    Frequency of Communication with Friends and Family: More than three times a week    Frequency of Social Gatherings with Friends and Family: More than three times a week    Attends Religious Services: More than 4 times per year    Active Member of [employer] or Organizations: Yes    Attends Banker Meetings: More than 4 times per year    Marital Status: Widowed  Intimate Partner Violence: Not At Risk (04/16/2022)   Humiliation, Afraid, Rape, and Kick questionnaire    Fear of Current or Ex-Partner: No    Emotionally Abused: No    Physically Abused: No    Sexually Abused: No    FAMILY HISTORY: Family History  Problem Relation Age of Onset   Heart disease Mother        MI   Heart disease Father        MI   Arthritis Sister        crippling arthritis   Arthritis Brother        crippling arthritis    ALLERGIES:  has No Known Allergies.  MEDICATIONS:  Current Outpatient Medications  Medication Sig Dispense Refill   aspirin 81 MG tablet Take 81 mg by mouth daily.     busPIRone (BUSPAR) 15 MG tablet Take 0.5 tablets (7.5 mg total) by mouth 2 (two) times daily. 90 tablet 2   Cobalamin Combinations (NEURIVA PLUS PO) Take by mouth.     dexamethasone (DECADRON) 4 MG tablet Take 1 tablet (4 mg total) by mouth 2 (two) times daily with a meal. Start 2 days prior to chemo. Take for 2 days; DO NOT take on the day of chemo. 60 tablet 0   docusate sodium (COLACE) 100 MG capsule Take 200 mg by mouth daily.     DULoxetine (CYMBALTA) 60 MG  capsule TAKE 1 CAPSULE BY MOUTH EVERY DAY 90 capsule 1   Fluticasone-Umeclidin-Vilant (TRELEGY ELLIPTA) 100-62.5-25 MCG/ACT AEPB  Inhale 1 puff into the lungs daily. 28 each 11   hydrOXYzine (ATARAX) 10 MG tablet Take 1 tablet (10 mg total) by mouth at bedtime as needed. 30 tablet 0   lidocaine-prilocaine (EMLA) cream Apply on the port. 30 -45 min  prior to port access. 30 g 3   MAGNESIUM CITRATE PO Take by mouth daily.     Melatonin 10 MG CAPS Take 20 mg by mouth at bedtime.     naproxen sodium (ALEVE) 220 MG tablet Take 440 mg by mouth daily as needed (headaches).     pregabalin (LYRICA) 75 MG capsule TAKE 1 CAPSULE BY MOUTH TWICE A DAY 180 capsule 1   senna (SENOKOT) 8.6 MG tablet Take 1 tablet (8.6 mg total) by mouth 2 (two) times daily as needed for constipation. 14 tablet 0   traMADol (ULTRAM) 50 MG tablet Take 1 tablet (50 mg total) by mouth every 8 (eight) hours as needed. 90 tablet 0   acetaminophen (TYLENOL) 500 MG tablet Take 500 mg by mouth every 6 (six) hours as needed.     ondansetron (ZOFRAN) 8 MG tablet One pill every 8 hours as needed for nausea/vomitting. (Patient not taking: Reported on 07/07/2022) 40 tablet 1   prochlorperazine (COMPAZINE) 10 MG tablet Take 1 tablet (10 mg total) by mouth every 6 (six) hours as needed for nausea or vomiting. (Patient not taking: Reported on 07/07/2022) 40 tablet 1   No current facility-administered medications for this visit.   Facility-Administered Medications Ordered in Other Visits  Medication Dose Route Frequency Provider Last Rate Last Admin   0.9 %  sodium chloride infusion   Intravenous Continuous Rushie Chestnut, PA-C   Stopped at 02/17/22 1214   heparin lock flush 100 UNIT/ML injection            sodium chloride flush (NS) 0.9 % injection 10 mL  10 mL Intravenous Once Alinda Dooms, NP          .  PHYSICAL EXAMINATION: ECOG PERFORMANCE STATUS: 0 - Asymptomatic  Vitals:   07/07/22 1058  BP: 135/60  Pulse: 75  Resp: 17   Temp: 97.8 F (36.6 C)  SpO2: 100%    Filed Weights   07/07/22 1058  Weight: 176 lb 9.6 oz (80.1 kg)     Physical Exam Vitals and nursing note reviewed.  Constitutional:      Comments:     HENT:     Head: Normocephalic and atraumatic.     Mouth/Throat:     Mouth: Mucous membranes are moist.     Pharynx: No oropharyngeal exudate.  Eyes:     Pupils: Pupils are equal, round, and reactive to light.  Cardiovascular:     Rate and Rhythm: Normal rate and regular rhythm.  Pulmonary:     Effort: No respiratory distress.     Breath sounds: No wheezing.     Comments: Decreased breath sounds bilaterally at bases.  No wheeze or crackles Abdominal:     General: Bowel sounds are normal. There is no distension.     Palpations: Abdomen is soft. There is no mass.     Tenderness: There is no abdominal tenderness. There is no guarding or rebound.  Musculoskeletal:        General: No tenderness. Normal range of motion.     Cervical back: Normal range of motion and neck supple.  Skin:    General: Skin is warm.  Neurological:     Mental Status: She is alert and  oriented to person, place, and time.  Psychiatric:        Mood and Affect: Affect normal.        Judgment: Judgment normal.      LABORATORY DATA:  I have reviewed the data as listed Lab Results  Component Value Date   WBC 6.8 07/07/2022   HGB 12.4 07/07/2022   HCT 36.6 07/07/2022   MCV 87.6 07/07/2022   PLT 227 07/07/2022   Recent Labs    05/21/22 0921 06/16/22 1451 07/07/22 1040  NA 134* 132* 134*  K 4.1 4.0 4.1  CL 104 101 103  CO2 24 24 23   GLUCOSE 130* 131* 115*  BUN 11 12 10   CREATININE 0.73 0.69 0.53  CALCIUM 9.3 9.1 9.7  GFRNONAA >60 >60 >60  PROT 6.6 7.0 7.2  ALBUMIN 3.6 3.8 3.7  AST 18 18 18   ALT 9 10 10   ALKPHOS 103 112 120  BILITOT 0.6 0.5 0.6    RADIOGRAPHIC STUDIES: I have personally reviewed the radiological images as listed and agreed with the findings in the report. No results  found.  ASSESSMENT & PLAN:   Cancer of upper lobe of left lung (HCC) # Left upper lobe-lung cancer moderately differentiated squamous cell carcinoma-pT1pN1- [IIB]; PDL-1 =70%. EGFR status-WILD.  Adjuvant chemotherapy-cisplatin Taxotere x 4 cycles; followed by adjuvant Pembrolizumab x1 year.  s/p 4 cycles of Cis-Taxotere-CT scan March 2024 maturing posttreatment changes from left sided left upper lobe resection with volume loss. Small left effusion. No new developing mass lesion, lymph node enlargement.  # proceed with adjuvant therapy-Keytruda cycle #2/17. Labs today reviewed;  acceptable for treatment today.  APRIl 2024- Thyroid- WNLs  # Skin Itch with out rash- ?- sec to Keytruda- G-2- on benadryl BID; add Atarax 10 qhs- stable.  # PN G-1 -2; from Taxotere.  Monitor for now. Stable.   # Ongoing fatigue : stable.   # # IV Access: port-functional.  pt requesting AM appts-   # DISPOSITION: # keytruda today # follow up in 3 weeks- MD; labs- cbc/cmp; Rande Lawman- Dr.B  All questions were answered. The patient knows to call the clinic with any problems, questions or concerns.    Earna Coder, MD 07/07/2022 11:41 AM

## 2022-07-07 NOTE — Progress Notes (Signed)
Pt feels good. Has itching all over no rash. Suggested patient to try 1 benadryl capsule to see if it would alleviate the itching, which is worse at night. Started with the [place] infusions. Appetite is good. Hx fibromyalgia often has pain. None this morning.

## 2022-07-07 NOTE — Assessment & Plan Note (Addendum)
#   Left upper lobe-lung cancer moderately differentiated squamous cell carcinoma-pT1pN1- [IIB]; PDL-1 =70%. EGFR status-WILD.  Adjuvant chemotherapy-cisplatin Taxotere x 4 cycles; followed by adjuvant Pembrolizumab x1 year.  s/p 4 cycles of Cis-Taxotere-CT scan March 2024 maturing posttreatment changes from left sided left upper lobe resection with volume loss. Small left effusion. No new developing mass lesion, lymph node enlargement.  # proceed with adjuvant therapy-Keytruda cycle #2/17. Labs today reviewed;  acceptable for treatment today.  APRIl 2024- Thyroid- WNLs  # Skin Itch with out rash- ?- sec to Keytruda- G-2- on benadryl BID; add Atarax 10 qhs- stable.  # PN G-1 -2; from Taxotere.  Monitor for now. Stable.   # Ongoing fatigue : stable.   # # IV Access: port-functional.  pt requesting AM appts-   # DISPOSITION: # keytruda today # follow up in 3 weeks- MD; labs- cbc/cmp; Keytruda- Dr.B

## 2022-07-07 NOTE — Patient Instructions (Signed)
[facility]  Discharge Instructions: Thank you for choosing [facility] to provide your oncology and hematology care.  If you have a lab appointment with the Cancer Center, please go directly to the Cancer Center and check in at the registration area.  Wear comfortable clothing and clothing appropriate for easy access to any Portacath or PICC line.   We strive to give you quality time with your provider. You may need to reschedule your appointment if you arrive late (15 or more minutes).  Arriving late affects you and other patients whose appointments are after yours.  Also, if you miss three or more appointments without notifying the office, you may be dismissed from the clinic at the provider's discretion.      For prescription refill requests, have your pharmacy contact our office and allow 72 hours for refills to be completed.    Today you received the following chemotherapy and/or immunotherapy agents- Keytruda      To help prevent nausea and vomiting after your treatment, we encourage you to take your nausea medication as directed.  BELOW ARE SYMPTOMS THAT SHOULD BE REPORTED IMMEDIATELY: *FEVER GREATER THAN 100.4 F (38 C) OR HIGHER *CHILLS OR SWEATING *NAUSEA AND VOMITING THAT IS NOT CONTROLLED WITH YOUR NAUSEA MEDICATION *UNUSUAL SHORTNESS OF BREATH *UNUSUAL BRUISING OR BLEEDING *URINARY PROBLEMS (pain or burning when urinating, or frequent urination) *BOWEL PROBLEMS (unusual diarrhea, constipation, pain near the anus) TENDERNESS IN MOUTH AND THROAT WITH OR WITHOUT PRESENCE OF ULCERS (sore throat, sores in mouth, or a toothache) UNUSUAL RASH, SWELLING OR PAIN  UNUSUAL VAGINAL DISCHARGE OR ITCHING   Items with * indicate a potential emergency and should be followed up as soon as possible or go to the Emergency Department if any problems should occur.  Please show the CHEMOTHERAPY ALERT CARD or IMMUNOTHERAPY ALERT CARD at check-in to  the Emergency Department and triage nurse.  Should you have questions after your visit or need to cancel or reschedule your appointment, please contact [facility]  [phone] and follow the prompts.  Office hours are 8:00 a.m. to 4:30 p.m. Monday - Friday. Please note that voicemails left after 4:00 p.m. may not be returned until the following business day.  We are closed weekends and major holidays. You have access to a nurse at all times for urgent questions. Please call the main number to the clinic [phone] and follow the prompts.  For any non-urgent questions, you may also contact your provider using MyChart. We now offer e-Visits for anyone 18 and older to request care online for non-urgent symptoms. For details visit [email or web address].   Also download the MyChart app! Go to the app store, search "MyChart", open the app, select [facility], and log in with your MyChart username and password.   

## 2022-07-14 ENCOUNTER — Encounter: Payer: Self-pay | Admitting: Family Medicine

## 2022-07-14 ENCOUNTER — Ambulatory Visit (INDEPENDENT_AMBULATORY_CARE_PROVIDER_SITE_OTHER): Payer: Medicare Other | Admitting: Family Medicine

## 2022-07-14 VITALS — BP 138/80 | HR 71 | Temp 97.2°F | Ht 65.0 in | Wt 185.0 lb

## 2022-07-14 DIAGNOSIS — R300 Dysuria: Secondary | ICD-10-CM

## 2022-07-14 LAB — POC URINALSYSI DIPSTICK (AUTOMATED)
Bilirubin, UA: NEGATIVE
Blood, UA: NEGATIVE
Glucose, UA: NEGATIVE
Ketones, UA: NEGATIVE
Nitrite, UA: POSITIVE
Protein, UA: NEGATIVE
Spec Grav, UA: 1.01 (ref 1.010–1.025)
Urobilinogen, UA: 0.2 E.U./dL
pH, UA: 6.5 (ref 5.0–8.0)

## 2022-07-14 MED ORDER — CEPHALEXIN 500 MG PO CAPS: 500.0000 mg | ORAL_CAPSULE | Freq: Three times a day (TID) | ORAL | 0 refills | Status: DC

## 2022-07-14 NOTE — Progress Notes (Unsigned)
dysuria: yes duration of symptoms: 4 days.   abdominal pain: lower abd sore over the last few days.   fevers:no back pain: lower back pain noted.   vomiting:no U/a d/w pt.   I prev saw her husband prior to him passing.  She was asking about her daughter getting set up here.  D/w pt.    Meds, vitals, and allergies reviewed.   Per HPI unless specifically indicated in ROS section   GEN: nad, alert and oriented HEENT: ncat NECK: supple CV: rrr.  PULM: ctab, no inc wob ABD: soft, +bs, lower abd TTP B EXT: no edema SKIN: no acute rash BACK: no CVA pain

## 2022-07-14 NOTE — Patient Instructions (Signed)
Drink plenty of water and start the antibiotics today.  We'll contact you with your lab report.  Take care.   

## 2022-07-16 ENCOUNTER — Ambulatory Visit
Admission: RE | Admit: 2022-07-16 | Discharge: 2022-07-16 | Disposition: A | Discharge status: Home / Self Care | Payer: Medicare Other | Source: Ambulatory Visit | Attending: Hospice and Palliative Medicine | Admitting: Hospice and Palliative Medicine

## 2022-07-16 ENCOUNTER — Ambulatory Visit
Admission: RE | Admit: 2022-07-16 | Discharge: 2022-07-16 | Disposition: A | Discharge status: Home / Self Care | Payer: Medicare Other | Attending: Hospice and Palliative Medicine | Admitting: Hospice and Palliative Medicine

## 2022-07-16 ENCOUNTER — Telehealth: Payer: Self-pay | Admitting: *Deleted

## 2022-07-16 ENCOUNTER — Inpatient Hospital Stay (HOSPITAL_BASED_OUTPATIENT_CLINIC_OR_DEPARTMENT_OTHER): Payer: Medicare Other | Admitting: Hospice and Palliative Medicine

## 2022-07-16 DIAGNOSIS — R0602 Shortness of breath: Secondary | ICD-10-CM | POA: Insufficient documentation

## 2022-07-16 DIAGNOSIS — R300 Dysuria: Secondary | ICD-10-CM | POA: Insufficient documentation

## 2022-07-16 DIAGNOSIS — C3412 Malignant neoplasm of upper lobe, left bronchus or lung: Secondary | ICD-10-CM

## 2022-07-16 DIAGNOSIS — R059 Cough, unspecified: Secondary | ICD-10-CM | POA: Diagnosis not present

## 2022-07-16 LAB — URINE CULTURE
MICRO NUMBER:: 14947839
SPECIMEN QUALITY:: ADEQUATE

## 2022-07-16 MED ORDER — METHYLPREDNISOLONE 4 MG PO TBPK: ORAL_TABLET | ORAL | 0 refills | Status: DC

## 2022-07-16 NOTE — Telephone Encounter (Signed)
Daughter called reporting that patient is having shortness of breath with any activity and is wondering if the fluid is building back up again . Please advise

## 2022-07-16 NOTE — Telephone Encounter (Signed)
Per request of [facility], call returned to daughter and asked that she take patient for CXR and then we would set up a virtual visit with her. Daughter agreed to take patient now for CXR and will go to [facility] for it

## 2022-07-16 NOTE — Assessment & Plan Note (Signed)
Presumed cystitis.  Start Keflex.  Urinalysis discussed.  Check culture.  Continue taking plenty of fluids by mouth.  Update [place] as needed.  Okay for outpatient follow-up.

## 2022-07-16 NOTE — Telephone Encounter (Signed)
Stat Chest xray now please and mychart visit today to go over results. Msg sent to scheduling to add mychart visit for today.

## 2022-07-16 NOTE — Progress Notes (Signed)
Virtual Visit via Video Note  I connected with [name] on 07/16/22 at  2:45 PM EDT by a video enabled telemedicine application and verified that I am speaking with the correct person using two identifiers.  Location: Patient: Restaurant Provider: Clinic   I discussed the limitations of evaluation and management by telemedicine and the availability of in person appointments. The patient expressed understanding and agreed to proceed.  History of Present Illness: [name] [name] is a 72 year old woman with multiple medical problems including stage II squamous cell carcinoma left lung status post resection followed by 4 cycles adjuvant cisplatin/Taxotere now on adjuvant Keytruda.   Observations/Objective: Patient received cycle 2 Keytruda on 07/07/2022.  She reports since receiving treatment she is felt more short of breath.  Mostly shortness of breath that is exertional but stops when she rests.  She denies fever or chills.  No changes in cough or chronic congestion.  No changes in chronic rhinorrhea.  No known sick contacts.  She denies wheezing but feels like her inhalers are no longer as effective.  Assessment and Plan: Stage II NSCLC -s/p resection followed by adjuvant chemotherapy now on adjuvant immunotherapy  Shortness of breath -patient is nontoxic-appearing.  She is currently eating lunch at a restaurant during video call.  Chest x-ray without acute cardiopulmonary findings.  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  Discussed with Dr. Donneta Romberg and will start patient on Medrol dose pack.  Patient also recommended to follow-up with her pulmonologist as COPD exacerbation could be another consideration.  Follow Up Instructions: Patient to see Dr. Donneta Romberg in 2 weeks   I discussed the assessment and treatment plan with the patient. The patient was provided an opportunity to ask questions and all  were answered. The patient agreed with the plan and demonstrated an understanding of the instructions.   The patient was advised to call back or seek an in-person evaluation if the symptoms worsen or if the condition fails to improve as anticipated.  I provided 15 minutes of non-face-to-face time during this encounter.   Malachy Moan, NP

## 2022-07-29 ENCOUNTER — Encounter: Payer: Self-pay | Admitting: Internal Medicine

## 2022-07-29 ENCOUNTER — Inpatient Hospital Stay: Payer: Medicare Other | Admitting: Internal Medicine

## 2022-07-29 ENCOUNTER — Inpatient Hospital Stay: Payer: Medicare Other

## 2022-07-29 VITALS — BP 119/70 | HR 59 | Temp 97.3°F | Resp 17 | Ht 65.0 in | Wt 181.8 lb

## 2022-07-29 DIAGNOSIS — G629 Polyneuropathy, unspecified: Secondary | ICD-10-CM | POA: Diagnosis not present

## 2022-07-29 DIAGNOSIS — Z79620 Long term (current) use of immunosuppressive biologic: Secondary | ICD-10-CM | POA: Diagnosis not present

## 2022-07-29 DIAGNOSIS — C3412 Malignant neoplasm of upper lobe, left bronchus or lung: Secondary | ICD-10-CM

## 2022-07-29 DIAGNOSIS — R059 Cough, unspecified: Secondary | ICD-10-CM | POA: Diagnosis not present

## 2022-07-29 DIAGNOSIS — Z5112 Encounter for antineoplastic immunotherapy: Secondary | ICD-10-CM | POA: Diagnosis not present

## 2022-07-29 DIAGNOSIS — M797 Fibromyalgia: Secondary | ICD-10-CM | POA: Diagnosis not present

## 2022-07-29 LAB — CBC WITH DIFFERENTIAL (CANCER CENTER ONLY)
Abs Immature Granulocytes: 0.03 10*3/uL (ref 0.00–0.07)
Basophils Absolute: 0 10*3/uL (ref 0.0–0.1)
Basophils Relative: 0 %
Eosinophils Absolute: 0.3 10*3/uL (ref 0.0–0.5)
Eosinophils Relative: 4 %
HCT: 37.3 % (ref 36.0–46.0)
Hemoglobin: 11.9 g/dL — ABNORMAL LOW (ref 12.0–15.0)
Immature Granulocytes: 1 %
Lymphocytes Relative: 27 %
Lymphs Abs: 1.7 10*3/uL (ref 0.7–4.0)
MCH: 28.3 pg (ref 26.0–34.0)
MCHC: 31.9 g/dL (ref 30.0–36.0)
MCV: 88.6 fL (ref 80.0–100.0)
Monocytes Absolute: 0.5 10*3/uL (ref 0.1–1.0)
Monocytes Relative: 7 %
Neutro Abs: 3.9 10*3/uL (ref 1.7–7.7)
Neutrophils Relative %: 61 %
Platelet Count: 238 10*3/uL (ref 150–400)
RBC: 4.21 MIL/uL (ref 3.87–5.11)
RDW: 13.3 % (ref 11.5–15.5)
WBC Count: 6.4 10*3/uL (ref 4.0–10.5)
nRBC: 0 % (ref 0.0–0.2)

## 2022-07-29 LAB — CMP (CANCER CENTER ONLY)
ALT: 16 U/L (ref 0–44)
AST: 22 U/L (ref 15–41)
Albumin: 3.6 g/dL (ref 3.5–5.0)
Alkaline Phosphatase: 97 U/L (ref 38–126)
Anion gap: 7 (ref 5–15)
BUN: 13 mg/dL (ref 8–23)
CO2: 26 mmol/L (ref 22–32)
Calcium: 9.2 mg/dL (ref 8.9–10.3)
Chloride: 101 mmol/L (ref 98–111)
Creatinine: 0.79 mg/dL (ref 0.44–1.00)
GFR, Estimated: >60 mL/min (ref >60)
Glucose, Bld: 112 mg/dL — ABNORMAL HIGH (ref 70–99)
Potassium: 4.3 mmol/L (ref 3.5–5.1)
Sodium: 134 mmol/L — ABNORMAL LOW (ref 135–145)
Total Bilirubin: 0.4 mg/dL (ref 0.3–1.2)
Total Protein: 6.9 g/dL (ref 6.5–8.1)

## 2022-07-29 LAB — TSH: TSH: 3.771 u[IU]/mL (ref 0.350–4.500)

## 2022-07-29 MED ORDER — HEPARIN SOD (PORK) LOCK FLUSH 100 UNIT/ML IV SOLN
500.0000 [IU] | Freq: Once | INTRAVENOUS | Status: AC | PRN
  Administered 2022-07-29: 500 [IU]
  Filled 2022-07-29: qty 5

## 2022-07-29 MED ORDER — SODIUM CHLORIDE 0.9 % IV SOLN
Freq: Once | INTRAVENOUS | Status: AC
  Filled 2022-07-29: qty 250

## 2022-07-29 MED ORDER — SODIUM CHLORIDE 0.9 % IV SOLN
200.0000 mg | Freq: Once | INTRAVENOUS | Status: AC
  Administered 2022-07-29: 200 mg via INTRAVENOUS
  Filled 2022-07-29: qty 8

## 2022-07-29 NOTE — Progress Notes (Signed)
Feels good. Only small complaint would be fatigue. Denies pain. Appetite is good. Bowels normal. Has mild neuropathy in fingers.

## 2022-07-29 NOTE — Assessment & Plan Note (Addendum)
#   Left upper lobe-lung cancer moderately differentiated squamous cell carcinoma-pT1pN1- [IIB]; PDL-1 =70%. EGFR status-WILD.  Adjuvant chemotherapy-cisplatin Taxotere x 4 cycles; followed by adjuvant Pembrolizumab x1 year.  s/p 4 cycles of Cis-Taxotere-CT scan March 2024 maturing posttreatment changes from left sided left upper lobe resection with volume loss. Small left effusion. No new developing mass lesion, lymph node enlargement.  # proceed with adjuvant therapy-Keytruda cycle #3/17. Labs today reviewed;  acceptable for treatment today.  APRIl 2024- Thyroid- WNLs  # Skin Itch with out rash- ?- sec to Keytruda- G-2- on benadryl BID; add Atarax 10 qhs- stable.  # PN G-1 -2; from Taxotere.  Monitor for now.stable.   # Ongoing fatigue : stable.  # # IV Access: port-functional.  pt requesting AM appts-   # DISPOSITION: # keytruda today # follow up in 3 weeks- MD; labs- cbc/cmp; Keytruda- Dr.B

## 2022-07-29 NOTE — Patient Instructions (Signed)
[facility]  Discharge Instructions: Thank you for choosing [facility] to provide your oncology and hematology care.  If you have a lab appointment with the Cancer Center, please go directly to the Cancer Center and check in at the registration area.  Wear comfortable clothing and clothing appropriate for easy access to any Portacath or PICC line.   We strive to give you quality time with your provider. You may need to reschedule your appointment if you arrive late (15 or more minutes).  Arriving late affects you and other patients whose appointments are after yours.  Also, if you miss three or more appointments without notifying the office, you may be dismissed from the clinic at the provider's discretion.      For prescription refill requests, have your pharmacy contact our office and allow 72 hours for refills to be completed.    Today you received the following chemotherapy and/or immunotherapy agents- Keytruda      To help prevent nausea and vomiting after your treatment, we encourage you to take your nausea medication as directed.  BELOW ARE SYMPTOMS THAT SHOULD BE REPORTED IMMEDIATELY: *FEVER GREATER THAN 100.4 F (38 C) OR HIGHER *CHILLS OR SWEATING *NAUSEA AND VOMITING THAT IS NOT CONTROLLED WITH YOUR NAUSEA MEDICATION *UNUSUAL SHORTNESS OF BREATH *UNUSUAL BRUISING OR BLEEDING *URINARY PROBLEMS (pain or burning when urinating, or frequent urination) *BOWEL PROBLEMS (unusual diarrhea, constipation, pain near the anus) TENDERNESS IN MOUTH AND THROAT WITH OR WITHOUT PRESENCE OF ULCERS (sore throat, sores in mouth, or a toothache) UNUSUAL RASH, SWELLING OR PAIN  UNUSUAL VAGINAL DISCHARGE OR ITCHING   Items with * indicate a potential emergency and should be followed up as soon as possible or go to the Emergency Department if any problems should occur.  Please show the CHEMOTHERAPY ALERT CARD or IMMUNOTHERAPY ALERT CARD at check-in to  the Emergency Department and triage nurse.  Should you have questions after your visit or need to cancel or reschedule your appointment, please contact [facility]  [phone] and follow the prompts.  Office hours are 8:00 a.m. to 4:30 p.m. Monday - Friday. Please note that voicemails left after 4:00 p.m. may not be returned until the following business day.  We are closed weekends and major holidays. You have access to a nurse at all times for urgent questions. Please call the main number to the clinic [phone] and follow the prompts.  For any non-urgent questions, you may also contact your provider using MyChart. We now offer e-Visits for anyone 18 and older to request care online for non-urgent symptoms. For details visit [email or web address].   Also download the MyChart app! Go to the app store, search "MyChart", open the app, select , and log in with your MyChart username and password.   

## 2022-07-29 NOTE — Progress Notes (Signed)
[facility] CONSULT NOTE  Patient Care Team: Tower, Audrie Gallus, MD as PCP - General Glory Buff, RN as Oncology Nurse Navigator Earna Coder, MD as Consulting Physician (Internal Medicine)  CHIEF COMPLAINTS/PURPOSE OF CONSULTATION: lung cancer  #  Oncology History Overview Note  JULY 2023- 1. 10 mm mean diameter nodule in the left upper lobe adjacent to an area of pleural and parenchymal scarring.     SEP 2023- 1. Hypermetabolic spiculated nodular left upper lobe pulmonary lesion is compatible with primary bronchogenic neoplasm. 2. Mildly metabolic nodularity in the left hilum, without discrete adenopathy may reflect early nodal disease involvement. Suggest attention on follow-up imaging. 3. No evidence of distant hypermetabolic metastatic disease. 4. Hypermetabolic nodule in the left lobe of the thyroid, Recommend thyroid US and biopsy (ref: J Am Coll Radiol. 2015 Feb;12(2): 143-50). 5. Aortic Atherosclerosis (ICD10-I70.0) and Emphysema (ICD10-J43.9).  # LUNG: Resection  Synchronous Tumors: Not applicable Total Number of Primary Tumors: 1 Procedure: Lobectomy, lung Specimen Laterality: Left Tumor Focality: Unifocal Tumor Site: Upper lobe Tumor Size: 2.6 cm Histologic Type: Squamous cell carcinoma, moderately differentiated Visceral Pleura Invasion: Not identified Direct Invasion of Adjacent Structures: No adjacent structures present Lymphovascular Invasion: Not identified Margins: All margins negative for invasive carcinoma      Closest Margin(s) to Invasive Carcinoma: Bronchovascular margin      Margin(s) Involved by Invasive Carcinoma: Not applicable       Margin Status for Non-Invasive Tumor: Negative Treatment Effect: No known presurgical therapy Regional Lymph Nodes:      Number of Lymph Nodes Involved: 1                           Nodal Sites with Tumor: Level 10      Number of Lymph Nodes Examined: 21                      Nodal Sites  Examined: Levels 4, 5, 7, 9, 10, 11, 12 and 13 Distant Metastasis:      Distant Site(s) Involved: Not applicable Pathologic Stage Classification (pTNM, AJCC 8th Edition): pT1c, pN1 Ancillary Studies: Can be performed upon request Representative Tumor Block: B5 Comment(s): None  # DEC 14th, 2023- Cisplatin-Taxotere #1; D-2 Greggory Keen x4 cycles [finished FEB 2024]  # April 15th, 2024- Keytruda q 3 W x1 year    Lung cancer (HCC)  12/05/2021 Initial Diagnosis   Lung cancer (HCC)   12/18/2021 Cancer Staging   Staging form: Lung, AJCC 8th Edition - Pathologic stage from 12/18/2021: Stage IIB (pT1c, pN1, cM0) - Signed by Loreli Slot, MD on 12/18/2021 Histopathologic type: Squamous cell carcinoma, NOS   Cancer of upper lobe of left lung (HCC)  01/03/2022 Initial Diagnosis   Cancer of upper lobe of left lung (HCC)   01/03/2022 Cancer Staging   Staging form: Lung, AJCC 8th Edition - Pathologic: Stage IIB (pT1c, pN1, cM0) - Signed by Earna Coder, MD on 01/03/2022   02/13/2022 - 04/18/2022 Chemotherapy   Patient is on Treatment Plan : LUNG Cisplatin + Docetaxel q21d     06/16/2022 -  Chemotherapy   Patient is on Treatment Plan : LUNG NSCLC Pembrolizumab (200) q21d      HISTORY OF PRESENTING ILLNESS: Ambulating independently.  Alone.   [name] [name] 72 y.o.  female history of smoking with stage II T2 N1 squamous cell carcinoma left lung cancer currently s/p adjuvant chemotherapy -is here to proceed with  adjuvant  immunotherapy/keytruda.   Seen in [facility]- for dyspnea or cough.  She denies fever or chills.  No changes in cough or chronic congestion.  No changes in chronic rhinorrhea.  No known sick contacts.    Otherwise feels good. Only small complaint would be fatigue. Denies pain. Appetite is good. Bowels normal. Has mild neuropathy in fingers   Denies any significant leg swelling.  Denies any worsening pain.   Review of Systems  Constitutional:  Negative for chills,  diaphoresis, fever, malaise/fatigue and weight loss.  HENT:  Negative for nosebleeds and sore throat.   Eyes:  Negative for double vision.  Respiratory:  Negative for cough, hemoptysis, sputum production, shortness of breath and wheezing.   Cardiovascular:  Negative for chest pain, palpitations, orthopnea and leg swelling.  Gastrointestinal:  Negative for abdominal pain, blood in stool, constipation, diarrhea, heartburn, melena, nausea and vomiting.  Genitourinary:  Negative for dysuria, frequency and urgency.  Musculoskeletal:  Negative for back pain and joint pain.  Skin: Negative.  Negative for itching and rash.  Neurological:  Negative for dizziness, tingling, focal weakness, weakness and headaches.  Endo/Heme/Allergies:  Does not bruise/bleed easily.  Psychiatric/Behavioral:  Negative for depression. The patient is not nervous/anxious and does not have insomnia.      MEDICAL HISTORY:  Past Medical History:  Diagnosis Date   Arthritis    OA   Carpal tunnel syndrome    Depression    Endometriosis    Fibromyalgia    Hyperlipidemia    Left upper lobe pulmonary nodule 11/2021   Post herpetic neuralgia    Sleep disorder    Tobacco abuse     SURGICAL HISTORY: Past Surgical History:  Procedure Laterality Date   ABDOMINAL HYSTERECTOMY  1992   total endometriosis   APPENDECTOMY  1992   bladder tack  1992   CATARACT EXTRACTION W/PHACO Right 03/25/2018   Procedure: CATARACT EXTRACTION PHACO AND INTRAOCULAR LENS PLACEMENT (IOC) RIGHT;  Surgeon: Elliot Cousin, MD;  Location: [facility] ORS;  Service: Ophthalmology;  Laterality: Right;  [place]  01:11 CDE 8.02 Fluid pack lot # [id number]   CATARACT EXTRACTION W/PHACO Left 04/22/2018   Procedure: CATARACT EXTRACTION PHACO AND INTRAOCULAR LENS PLACEMENT (IOC) Left Eye;  Surgeon: Elliot Cousin, MD;  Location: [facility] ORS;  Service: Ophthalmology;  Laterality: Left;  [place]  01:02 CDE 8.11 Fluid pack lot # [id number]   COLONOSCOPY     FOOT SURGERY Right  04/2016   [facility] in [place] great toe; straightening others   INTERCOSTAL NERVE BLOCK Left 12/12/2021   Procedure: INTERCOSTAL NERVE BLOCK;  Surgeon: Loreli Slot, MD;  Location[facility];  Service: Thoracic;  Laterality: Left;   IR IMAGING GUIDED PORT INSERTION  02/27/2022   LYMPH NODE DISSECTION Left 12/12/2021   Procedure: LYMPH NODE DISSECTION;  Surgeon: Loreli Slot, MD;  Location: [facility] OR;  Service: Thoracic;  Laterality: Left;   VIDEO BRONCHOSCOPY N/A 12/16/2021   Procedure: VIDEO BRONCHOSCOPY;  Surgeon: Loreli Slot, MD;  Location: [facility] OR;  Service: Thoracic;  Laterality: N/A;    SOCIAL HISTORY: Social History   Socioeconomic History   Marital status: Widowed    Spouse name: Not on file   Number of children: 2   Years of education: Not on file   Highest education level: Not on file  Occupational History   Not on file  Tobacco Use   Smoking status: Former    Packs/day: 1.00    Years: 55.00    Additional pack  years: 0.00    Total pack years: 55.00    Types: Cigarettes    Quit date: 12/03/2021    Years since quitting: 0.6   Smokeless tobacco: Never  Vaping Use   Vaping Use: Some days  Substance and Sexual Activity   Alcohol use: No    Alcohol/week: 0.0 standard drinks of alcohol   Drug use: No   Sexual activity: Not Currently  Other Topics Concern   Not on file  Social History Narrative   Lives alone   Social Determinants of Health   Financial Resource Strain: Low Risk  (04/16/2022)   Overall Financial Resource Strain (CARDIA)    Difficulty of Paying Living Expenses: Not hard at all  Food Insecurity: No Food Insecurity (04/16/2022)   Hunger Vital Sign    Worried About Running Out of Food in the Last Year: Never true    Ran Out of Food in the Last Year: Never true  Transportation Needs: No Transportation Needs (04/16/2022)   PRAPARE - Administrator, Civil Service (Medical): No    Lack of Transportation  (Non-Medical): No  Physical Activity: Inactive (04/16/2022)   Exercise Vital Sign    Days of Exercise per Week: 0 days    Minutes of Exercise per Session: 0 min  Stress: No Stress Concern Present (04/16/2022)   [employer] of Occupational Health - Occupational Stress Questionnaire    Feeling of Stress : Not at all  Social Connections: Moderately Integrated (04/16/2022)   Social Connection and Isolation Panel [NHANES]    Frequency of Communication with Friends and Family: More than three times a week    Frequency of Social Gatherings with Friends and Family: More than three times a week    Attends Religious Services: More than 4 times per year    Active Member of [employer] or Organizations: Yes    Attends Banker Meetings: More than 4 times per year    Marital Status: Widowed  Intimate Partner Violence: Not At Risk (04/16/2022)   Humiliation, Afraid, Rape, and Kick questionnaire    Fear of Current or Ex-Partner: No    Emotionally Abused: No    Physically Abused: No    Sexually Abused: No    FAMILY HISTORY: Family History  Problem Relation Age of Onset   Heart disease Mother        MI   Heart disease Father        MI   Arthritis Sister        crippling arthritis   Arthritis Brother        crippling arthritis    ALLERGIES:  has No Known Allergies.  MEDICATIONS:  Current Outpatient Medications  Medication Sig Dispense Refill   acetaminophen (TYLENOL) 500 MG tablet Take 500 mg by mouth every 6 (six) hours as needed.     aspirin 81 MG tablet Take 81 mg by mouth daily.     busPIRone (BUSPAR) 15 MG tablet Take 0.5 tablets (7.5 mg total) by mouth 2 (two) times daily. 90 tablet 2   cephALEXin (KEFLEX) 500 MG capsule Take 1 capsule (500 mg total) by mouth 3 (three) times daily. 21 capsule 0   Cobalamin Combinations (NEURIVA PLUS PO) Take by mouth.     dexamethasone (DECADRON) 4 MG tablet Take 1 tablet (4 mg total) by mouth 2 (two) times daily with a meal. Start 2 days  prior to chemo. Take for 2 days; DO NOT take on the day of chemo. 60 tablet 0  docusate sodium (COLACE) 100 MG capsule Take 200 mg by mouth daily.     DULoxetine (CYMBALTA) 60 MG capsule TAKE 1 CAPSULE BY MOUTH EVERY DAY 90 capsule 1   Fluticasone-Umeclidin-Vilant (TRELEGY ELLIPTA) 100-62.5-25 MCG/ACT AEPB Inhale 1 puff into the lungs daily. 28 each 11   hydrOXYzine (ATARAX) 10 MG tablet Take 1 tablet (10 mg total) by mouth at bedtime as needed. 30 tablet 0   lidocaine-prilocaine (EMLA) cream Apply on the port. 30 -45 min  prior to port access. 30 g 3   MAGNESIUM CITRATE PO Take by mouth daily.     Melatonin 10 MG CAPS Take 20 mg by mouth at bedtime.     methylPREDNISolone (MEDROL DOSEPAK) 4 MG TBPK tablet Take 6 tablets x 1 day, then take 5 tablets x 1 day, then take 4 tablets x 1 day, then take 3 tablets x 1 day, then take 2 tablets x 1 day, then take 1 tablet x 1 day, then stop 21 tablet 0   naproxen sodium (ALEVE) 220 MG tablet Take 440 mg by mouth daily as needed (headaches).     ondansetron (ZOFRAN) 8 MG tablet One pill every 8 hours as needed for nausea/vomitting. 40 tablet 1   pregabalin (LYRICA) 75 MG capsule TAKE 1 CAPSULE BY MOUTH TWICE A DAY 180 capsule 1   prochlorperazine (COMPAZINE) 10 MG tablet Take 1 tablet (10 mg total) by mouth every 6 (six) hours as needed for nausea or vomiting. 40 tablet 1   senna (SENOKOT) 8.6 MG tablet Take 1 tablet (8.6 mg total) by mouth 2 (two) times daily as needed for constipation. 14 tablet 0   traMADol (ULTRAM) 50 MG tablet Take 1 tablet (50 mg total) by mouth every 8 (eight) hours as needed. 90 tablet 0   No current facility-administered medications for this visit.   Facility-Administered Medications Ordered in Other Visits  Medication Dose Route Frequency Provider Last Rate Last Admin   0.9 %  sodium chloride infusion   Intravenous Continuous Rushie Chestnut, PA-C   Stopped at 02/17/22 1214   heparin lock flush 100 UNIT/ML injection             sodium chloride flush (NS) 0.9 % injection 10 mL  10 mL Intravenous Once Alinda Dooms, NP          .  PHYSICAL EXAMINATION: ECOG PERFORMANCE STATUS: 0 - Asymptomatic  Vitals:   07/29/22 1006  BP: 119/70  Pulse: (!) 59  Resp: 17  Temp: (!) 97.3 F (36.3 C)  SpO2: 99%    Filed Weights   07/29/22 1006  Weight: 181 lb 12.8 oz (82.5 kg)     Physical Exam Vitals and nursing note reviewed.  Constitutional:      Comments:     HENT:     Head: Normocephalic and atraumatic.     Mouth/Throat:     Mouth: Mucous membranes are moist.     Pharynx: No oropharyngeal exudate.  Eyes:     Pupils: Pupils are equal, round, and reactive to light.  Cardiovascular:     Rate and Rhythm: Normal rate and regular rhythm.  Pulmonary:     Effort: No respiratory distress.     Breath sounds: No wheezing.     Comments: Decreased breath sounds bilaterally at bases.  No wheeze or crackles Abdominal:     General: Bowel sounds are normal. There is no distension.     Palpations: Abdomen is soft. There is no mass.  Tenderness: There is no abdominal tenderness. There is no guarding or rebound.  Musculoskeletal:        General: No tenderness. Normal range of motion.     Cervical back: Normal range of motion and neck supple.  Skin:    General: Skin is warm.  Neurological:     Mental Status: She is alert and oriented to person, place, and time.  Psychiatric:        Mood and Affect: Affect normal.        Judgment: Judgment normal.      LABORATORY DATA:  I have reviewed the data as listed Lab Results  Component Value Date   WBC 6.4 07/29/2022   HGB 11.9 (L) 07/29/2022   HCT 37.3 07/29/2022   MCV 88.6 07/29/2022   PLT 238 07/29/2022   Recent Labs    06/16/22 1451 07/07/22 1040 07/29/22 0952  NA 132* 134* 134*  K 4.0 4.1 4.3  CL 101 103 101  CO2 24 23 26   GLUCOSE 131* 115* 112*  BUN 12 10 13   CREATININE 0.69 0.53 0.79  CALCIUM 9.1 9.7 9.2  GFRNONAA >60 >60 >60  PROT 7.0  7.2 6.9  ALBUMIN 3.8 3.7 3.6  AST 18 18 22   ALT 10 10 16   ALKPHOS 112 120 97  BILITOT 0.5 0.6 0.4    RADIOGRAPHIC STUDIES: I have personally reviewed the radiological images as listed and agreed with the findings in the report. DG Chest 2 View  Result Date: 07/16/2022 CLINICAL DATA:  Shortness of breath and cough for 1 week. History of cancer. EXAM: CHEST - 2 VIEW COMPARISON:  03/25/2022 FINDINGS: Power port type central venous catheter with tip over the low SVC region. No change in position. Partial left lung resection with elevation of the left hemidiaphragm and blunting of the left costophrenic angle, likely pleural thickening. This is unchanged. No airspace disease or consolidation. No pleural effusions. No pneumothorax. Mediastinal contours appear intact. Calcification of the aorta. IMPRESSION: Postoperative changes in the left lung. No evidence of active pulmonary disease. Electronically Signed   By: Burman Nieves M.D.   On: 07/16/2022 14:43    ASSESSMENT & PLAN:   Cancer of upper lobe of left lung (HCC) # Left upper lobe-lung cancer moderately differentiated squamous cell carcinoma-pT1pN1- [IIB]; PDL-1 =70%. EGFR status-WILD.  Adjuvant chemotherapy-cisplatin Taxotere x 4 cycles; followed by adjuvant Pembrolizumab x1 year.  s/p 4 cycles of Cis-Taxotere-CT scan March 2024 maturing posttreatment changes from left sided left upper lobe resection with volume loss. Small left effusion. No new developing mass lesion, lymph node enlargement.  # proceed with adjuvant therapy-Keytruda cycle #3/17. Labs today reviewed;  acceptable for treatment today.  APRIl 2024- Thyroid- WNLs  # Skin Itch with out rash- ?- sec to Keytruda- G-2- on benadryl BID; add Atarax 10 qhs- stable.  # PN G-1 -2; from Taxotere.  Monitor for now.stable.   # Ongoing fatigue : stable.  # # IV Access: port-functional.  pt requesting AM appts-   # DISPOSITION: # keytruda today # follow up in 3 weeks- MD; labs-  cbc/cmp; Rande Lawman- Dr.B  All questions were answered. The patient knows to call the clinic with any problems, questions or concerns.    Earna Coder, MD 07/29/2022 10:40 AM

## 2022-07-30 LAB — T4: T4, Total: 6.6 ug/dL (ref 4.5–12.0)

## 2022-08-01 ENCOUNTER — Encounter: Payer: Self-pay | Admitting: Primary Care

## 2022-08-01 ENCOUNTER — Other Ambulatory Visit
Admission: RE | Admit: 2022-08-01 | Discharge: 2022-08-01 | Disposition: A | Discharge status: Home / Self Care | Payer: Medicare Other | Source: Ambulatory Visit | Attending: Primary Care | Admitting: Primary Care

## 2022-08-01 ENCOUNTER — Ambulatory Visit: Payer: Medicare Other | Admitting: Primary Care

## 2022-08-01 VITALS — BP 120/80 | HR 69 | Temp 97.3°F | Ht 65.0 in | Wt 181.8 lb

## 2022-08-01 DIAGNOSIS — C3412 Malignant neoplasm of upper lobe, left bronchus or lung: Secondary | ICD-10-CM | POA: Diagnosis not present

## 2022-08-01 DIAGNOSIS — R0602 Shortness of breath: Secondary | ICD-10-CM

## 2022-08-01 DIAGNOSIS — J449 Chronic obstructive pulmonary disease, unspecified: Secondary | ICD-10-CM

## 2022-08-01 DIAGNOSIS — Z902 Acquired absence of lung [part of]: Secondary | ICD-10-CM | POA: Diagnosis not present

## 2022-08-01 LAB — BRAIN NATRIURETIC PEPTIDE: B Natriuretic Peptide: 45 pg/mL (ref 0.0–100.0)

## 2022-08-01 MED ORDER — STIOLTO RESPIMAT 2.5-2.5 MCG/ACT IN AERS: 2.0000 | INHALATION_SPRAY | Freq: Every day | RESPIRATORY_TRACT | 0 refills | Status: DC

## 2022-08-01 NOTE — Progress Notes (Unsigned)
@Patient  ID: [name] [name], female    DOB: 1950/05/06, 72 y.o.   MRN: [id number]  Chief Complaint  Patient presents with   Follow-up    SOB. Cough with clear sputum. No wheezing.    Referring provider: Judy Pimple, MD  HPI: 72 year old female, former smoker quit in 2023.  Past medical history significant for COPD, lung cancer status post lobectomy, thoracic aortic aneurysm.  08/01/2022 Patient presents today for COPD follow-up. She has mild obstructive airways disease with reversibility on pulmonary function testing. She is maintained on Trelegy Ellipta daily and prn albuterol. She had LUL lobectomy on October 12th 2023 with Dr. Dorris Fetch. No complications. CXR 07/16/22 showed postoperative changes in left lung, no active pulmonary disease.   She reports chronic dyspnea symptoms. Depending on how active she is she will find herself becoming quite short winded. Breathing is not acutely worse. She is using Trelegy one puff daily. She completed chemotherapy in February 2024, she was not very active during this time. She is currently on immunotherapy. Her weight is up 15-20 lbs (she gained 10 lbs in 1 week. She quit smoking but still vapes occasionally. No acute cough or wheezing symptoms.  No Known Allergies  Immunization History  Administered Date(s) Administered   Fluad Quad(high Dose 65+) 11/30/2018, 02/07/2020   Influenza Split 01/22/2011, 03/24/2012   Influenza, High Dose Seasonal PF 11/07/2016   Influenza,inj,Quad PF,6+ Mos 11/30/2012, 12/06/2014, 11/22/2015, 11/24/2017   Pneumococcal Conjugate-13 01/02/2015   Pneumococcal Polysaccharide-23 01/22/2011, 09/12/2016   Td 05/01/1998, 03/04/2007    Past Medical History:  Diagnosis Date   Arthritis    OA   Carpal tunnel syndrome    Depression    Endometriosis    Fibromyalgia    Hyperlipidemia    Left upper lobe pulmonary nodule 11/2021   Post herpetic neuralgia    Sleep disorder    Tobacco abuse      Tobacco History: Social History   Tobacco Use  Smoking Status Former   Packs/day: 1.00   Years: 55.00   Additional pack years: 0.00   Total pack years: 55.00   Types: Cigarettes   Quit date: 12/03/2021   Years since quitting: 0.6  Smokeless Tobacco Never   Counseling given: Not Answered   Outpatient Medications Prior to Visit  Medication Sig Dispense Refill   acetaminophen (TYLENOL) 500 MG tablet Take 500 mg by mouth every 6 (six) hours as needed.     aspirin 81 MG tablet Take 81 mg by mouth daily.     busPIRone (BUSPAR) 15 MG tablet Take 0.5 tablets (7.5 mg total) by mouth 2 (two) times daily. 90 tablet 2   Cobalamin Combinations (NEURIVA PLUS PO) Take by mouth.     dexamethasone (DECADRON) 4 MG tablet Take 1 tablet (4 mg total) by mouth 2 (two) times daily with a meal. Start 2 days prior to chemo. Take for 2 days; DO NOT take on the day of chemo. 60 tablet 0   docusate sodium (COLACE) 100 MG capsule Take 200 mg by mouth daily.     DULoxetine (CYMBALTA) 60 MG capsule TAKE 1 CAPSULE BY MOUTH EVERY DAY 90 capsule 1   hydrOXYzine (ATARAX) 10 MG tablet Take 1 tablet (10 mg total) by mouth at bedtime as needed. 30 tablet 0   lidocaine-prilocaine (EMLA) cream Apply on the port. 30 -45 min  prior to port access. 30 g 3   MAGNESIUM CITRATE PO Take by mouth daily.     Melatonin 10  MG CAPS Take 20 mg by mouth at bedtime.     naproxen sodium (ALEVE) 220 MG tablet Take 440 mg by mouth daily as needed (headaches).     ondansetron (ZOFRAN) 8 MG tablet One pill every 8 hours as needed for nausea/vomitting. 40 tablet 1   pregabalin (LYRICA) 75 MG capsule TAKE 1 CAPSULE BY MOUTH TWICE A DAY 180 capsule 1   prochlorperazine (COMPAZINE) 10 MG tablet Take 1 tablet (10 mg total) by mouth every 6 (six) hours as needed for nausea or vomiting. 40 tablet 1   senna (SENOKOT) 8.6 MG tablet Take 1 tablet (8.6 mg total) by mouth 2 (two) times daily as needed for constipation. 14 tablet 0   traMADol  (ULTRAM) 50 MG tablet Take 1 tablet (50 mg total) by mouth every 8 (eight) hours as needed. 90 tablet 0   Fluticasone-Umeclidin-Vilant (TRELEGY ELLIPTA) 100-62.5-25 MCG/ACT AEPB Inhale 1 puff into the lungs daily. 28 each 11   cephALEXin (KEFLEX) 500 MG capsule Take 1 capsule (500 mg total) by mouth 3 (three) times daily. (Patient not taking: Reported on 08/01/2022) 21 capsule 0   methylPREDNISolone (MEDROL DOSEPAK) 4 MG TBPK tablet Take 6 tablets x 1 day, then take 5 tablets x 1 day, then take 4 tablets x 1 day, then take 3 tablets x 1 day, then take 2 tablets x 1 day, then take 1 tablet x 1 day, then stop (Patient not taking: Reported on 08/01/2022) 21 tablet 0   Facility-Administered Medications Prior to Visit  Medication Dose Route Frequency Provider Last Rate Last Admin   0.9 %  sodium chloride infusion   Intravenous Continuous Covington, Sarah M, PA-C   Stopped at 02/17/22 1214   heparin lock flush 100 UNIT/ML injection            sodium chloride flush (NS) 0.9 % injection 10 mL  10 mL Intravenous Once Alinda Dooms, NP        Review of Systems  Review of Systems  Constitutional:  Positive for unexpected weight change.  HENT: Negative.    Respiratory:  Positive for shortness of breath. Negative for cough, chest tightness and wheezing.   Cardiovascular:  Positive for leg swelling.   Physical Exam  BP 120/80 (BP Location: Left Arm, Cuff Size: Normal)   Pulse 69   Temp (!) 97.3 F (36.3 C)   Ht 5\' 5"  (1.651 m)   Wt 181 lb 12.8 oz (82.5 kg)   SpO2 96%   BMI 30.25 kg/m  Physical Exam Constitutional:      Appearance: Normal appearance.  HENT:     Head: Normocephalic and atraumatic.     Mouth/Throat:     Mouth: Mucous membranes are moist.     Pharynx: Oropharynx is clear.  Cardiovascular:     Rate and Rhythm: Normal rate and regular rhythm.     Comments: + 1-2 BLE edema Pulmonary:     Effort: Pulmonary effort is normal.     Breath sounds: Normal breath sounds.      Comments: Diminished left lung base  Musculoskeletal:        General: Normal range of motion.  Skin:    General: Skin is warm and dry.  Neurological:     General: No focal deficit present.     Mental Status: She is alert and oriented to person, place, and time. Mental status is at baseline.  Psychiatric:        Mood and Affect: Mood normal.  Behavior: Behavior normal.        Thought Content: Thought content normal.        Judgment: Judgment normal.      Lab Results:  CBC    Component Value Date/Time   WBC 6.4 07/29/2022 0952   WBC 10.2 04/16/2022 0826   RBC 4.21 07/29/2022 0952   HGB 11.9 (L) 07/29/2022 0952   HCT 37.3 07/29/2022 0952   PLT 238 07/29/2022 0952   MCV 88.6 07/29/2022 0952   MCH 28.3 07/29/2022 0952   MCHC 31.9 07/29/2022 0952   RDW 13.3 07/29/2022 0952   LYMPHSABS 1.7 07/29/2022 0952   MONOABS 0.5 07/29/2022 0952   EOSABS 0.3 07/29/2022 0952   BASOSABS 0.0 07/29/2022 0952    BMET    Component Value Date/Time   NA 134 (L) 07/29/2022 0952   K 4.3 07/29/2022 0952   CL 101 07/29/2022 0952   CO2 26 07/29/2022 0952   GLUCOSE 112 (H) 07/29/2022 0952   BUN 13 07/29/2022 0952   CREATININE 0.79 07/29/2022 0952   CALCIUM 9.2 07/29/2022 0952   GFRNONAA >60 07/29/2022 0952   GFRAA 83 01/29/2007 0930    BNP    Component Value Date/Time   BNP 45.0 08/01/2022 1535    ProBNP No results found for: "PROBNP"  Imaging: DG Chest 2 View  Result Date: 07/16/2022 CLINICAL DATA:  Shortness of breath and cough for 1 week. History of cancer. EXAM: CHEST - 2 VIEW COMPARISON:  03/25/2022 FINDINGS: Power port type central venous catheter with tip over the low SVC region. No change in position. Partial left lung resection with elevation of the left hemidiaphragm and blunting of the left costophrenic angle, likely pleural thickening. This is unchanged. No airspace disease or consolidation. No pleural effusions. No pneumothorax. Mediastinal contours appear intact.  Calcification of the aorta. IMPRESSION: Postoperative changes in the left lung. No evidence of active pulmonary disease. Electronically Signed   By: Burman Nieves M.D.   On: 07/16/2022 14:43     Assessment & Plan:   Stage 1 mild COPD by GOLD classification (HCC) - Patient reports chronic dyspnea symptoms, not acutely worse. No active cough or wheezing. Recommend changing from Trelegy to [employer] Respimat- sample given. Repeat PFTs to compare lung function from previous testing before left upper lobectomy in October 2024. Encourage regular exercise and using IS three times a day.   Shortness of breath - Patient reports dyspnea symptoms with weight gain - Checking BNP, if elevated recommend getting echocardiogram   Cancer of upper lobe of left lung [facility]) - S/p LUL lobectomy in October 2023; Completed chemotherapy February 2024  - Currently undergoing immunotherapy with Keytruda, advised patient notify oncologist about new fatigue, weight gain and dyspnea symptoms since being on medication   Follow-up: - 4-6 weeks with Dr. Jayme Cloud or APP    Glenford Bayley, NP 08/04/2022

## 2022-08-01 NOTE — Patient Instructions (Signed)
Recommendations: - Stop Trelegy  - Trial Stiolto- take 2 puff daily in the morning x2 weeks (notify us if you want RX sent) - Aim to get 20-1mins of cardiovascular exercise 3 days a week and work on weight loss as able  - Orthoptist about weight gain and dyspnea since starting Keytruda   Orders: - Labs today (BNP) - PFTs re: dyspnea  Follow-up: - 4-6 weeks with Dr. Jayme Cloud or APP

## 2022-08-04 DIAGNOSIS — R0602 Shortness of breath: Secondary | ICD-10-CM | POA: Insufficient documentation

## 2022-08-04 NOTE — Assessment & Plan Note (Signed)
-   S/p LUL lobectomy in October 2023; Completed chemotherapy February 2024  - Currently undergoing immunotherapy with Keytruda, advised patient notify oncologist about new fatigue, weight gain and dyspnea symptoms since being on medication

## 2022-08-04 NOTE — Assessment & Plan Note (Deleted)
-   S/p LUL lobectomy in October 2023; Completed chemotherapy February 2024  - Currently undergoing immunotherapy with Keytruda, advised patient notify oncologist about new fatigue and dyspnea symptoms since being on medication

## 2022-08-04 NOTE — Assessment & Plan Note (Signed)
-   Patient reports dyspnea symptoms with weight gain - Checking BNP, if elevated recommend getting echocardiogram

## 2022-08-04 NOTE — Assessment & Plan Note (Signed)
-   Patient reports chronic dyspnea symptoms, not acutely worse. No active cough or wheezing. Recommend changing from Trelegy to [employer] Respimat- sample given. Repeat PFTs to compare lung function from previous testing before left upper lobectomy in October 2024. Encourage regular exercise and using IS three times a day.

## 2022-08-07 ENCOUNTER — Ambulatory Visit: Payer: Medicare Other | Attending: Primary Care

## 2022-08-07 DIAGNOSIS — R0602 Shortness of breath: Secondary | ICD-10-CM | POA: Diagnosis not present

## 2022-08-07 DIAGNOSIS — Z902 Acquired absence of lung [part of]: Secondary | ICD-10-CM | POA: Insufficient documentation

## 2022-08-07 DIAGNOSIS — R0609 Other forms of dyspnea: Secondary | ICD-10-CM | POA: Diagnosis not present

## 2022-08-07 DIAGNOSIS — Z87891 Personal history of nicotine dependence: Secondary | ICD-10-CM | POA: Diagnosis not present

## 2022-08-07 LAB — PULMONARY FUNCTION TEST ARMC ONLY
DL/VA % pred: 69 %
DL/VA: 2.86 ml/min/mmHg/L
DLCO unc % pred: 63 %
DLCO unc: 12.17 ml/min/mmHg
FEF 25-75 Post: 0.47 L/sec
FEF 25-75 Pre: 0.6 L/sec
FEF2575-%Change-Post: -21 %
FEF2575-%Pred-Post: 26 %
FEF2575-%Pred-Pre: 34 %
FEV1-%Change-Post: -4 %
FEV1-%Pred-Post: 74 %
FEV1-%Pred-Pre: 77 %
FEV1-Post: 1.62 L
FEV1-Pre: 1.69 L
FEV1FVC-%Change-Post: 20 %
FEV1FVC-%Pred-Pre: 80 %
FEV6-%Change-Post: -3 %
FEV6-%Pred-Post: 79 %
FEV6-%Pred-Pre: 82 %
FEV6-Post: 2.2 L
FEV6-Pre: 2.27 L
FEV6FVC-%Change-Post: 22 %
FEV6FVC-%Pred-Post: 104 %
FEV6FVC-%Pred-Pre: 85 %
FVC-%Change-Post: -20 %
FVC-%Pred-Post: 76 %
FVC-%Pred-Pre: 96 %
FVC-Post: 2.2 L
FVC-Pre: 2.78 L
Post FEV1/FVC ratio: 74 %
Post FEV6/FVC ratio: 100 %
Pre FEV1/FVC ratio: 61 %
Pre FEV6/FVC Ratio: 82 %
RV % pred: 98 %
RV: 2.2 L
TLC % pred: 91 %
TLC: 4.61 L

## 2022-08-19 ENCOUNTER — Inpatient Hospital Stay: Payer: Medicare Other

## 2022-08-19 ENCOUNTER — Encounter: Payer: Self-pay | Admitting: Internal Medicine

## 2022-08-19 ENCOUNTER — Inpatient Hospital Stay: Payer: Medicare Other | Attending: Internal Medicine | Admitting: Internal Medicine

## 2022-08-19 VITALS — BP 143/73 | HR 71 | Temp 97.3°F | Ht 65.0 in | Wt 178.0 lb

## 2022-08-19 DIAGNOSIS — C3412 Malignant neoplasm of upper lobe, left bronchus or lung: Secondary | ICD-10-CM

## 2022-08-19 DIAGNOSIS — Z79620 Long term (current) use of immunosuppressive biologic: Secondary | ICD-10-CM | POA: Diagnosis not present

## 2022-08-19 DIAGNOSIS — Z87891 Personal history of nicotine dependence: Secondary | ICD-10-CM | POA: Insufficient documentation

## 2022-08-19 DIAGNOSIS — Z5112 Encounter for antineoplastic immunotherapy: Secondary | ICD-10-CM | POA: Diagnosis not present

## 2022-08-19 DIAGNOSIS — R5383 Other fatigue: Secondary | ICD-10-CM | POA: Diagnosis not present

## 2022-08-19 LAB — CBC WITH DIFFERENTIAL (CANCER CENTER ONLY)
Abs Immature Granulocytes: 0.01 10*3/uL (ref 0.00–0.07)
Basophils Absolute: 0 10*3/uL (ref 0.0–0.1)
Basophils Relative: 1 %
Eosinophils Absolute: 0.3 10*3/uL (ref 0.0–0.5)
Eosinophils Relative: 5 %
HCT: 34.8 % — ABNORMAL LOW (ref 36.0–46.0)
Hemoglobin: 11.5 g/dL — ABNORMAL LOW (ref 12.0–15.0)
Immature Granulocytes: 0 %
Lymphocytes Relative: 28 %
Lymphs Abs: 1.6 10*3/uL (ref 0.7–4.0)
MCH: 28.3 pg (ref 26.0–34.0)
MCHC: 33 g/dL (ref 30.0–36.0)
MCV: 85.7 fL (ref 80.0–100.0)
Monocytes Absolute: 0.5 10*3/uL (ref 0.1–1.0)
Monocytes Relative: 9 %
Neutro Abs: 3.3 10*3/uL (ref 1.7–7.7)
Neutrophils Relative %: 57 %
Platelet Count: 210 10*3/uL (ref 150–400)
RBC: 4.06 MIL/uL (ref 3.87–5.11)
RDW: 14.4 % (ref 11.5–15.5)
WBC Count: 5.7 10*3/uL (ref 4.0–10.5)
nRBC: 0 % (ref 0.0–0.2)

## 2022-08-19 LAB — CMP (CANCER CENTER ONLY)
ALT: 13 U/L (ref 0–44)
AST: 19 U/L (ref 15–41)
Albumin: 3.6 g/dL (ref 3.5–5.0)
Alkaline Phosphatase: 105 U/L (ref 38–126)
Anion gap: 8 (ref 5–15)
BUN: 12 mg/dL (ref 8–23)
CO2: 24 mmol/L (ref 22–32)
Calcium: 9.1 mg/dL (ref 8.9–10.3)
Chloride: 104 mmol/L (ref 98–111)
Creatinine: 0.81 mg/dL (ref 0.44–1.00)
GFR, Estimated: >60 mL/min (ref >60)
Glucose, Bld: 101 mg/dL — ABNORMAL HIGH (ref 70–99)
Potassium: 4.1 mmol/L (ref 3.5–5.1)
Sodium: 136 mmol/L (ref 135–145)
Total Bilirubin: 0.6 mg/dL (ref 0.3–1.2)
Total Protein: 6.7 g/dL (ref 6.5–8.1)

## 2022-08-19 MED ORDER — SODIUM CHLORIDE 0.9 % IV SOLN
200.0000 mg | Freq: Once | INTRAVENOUS | Status: AC
  Administered 2022-08-19: 200 mg via INTRAVENOUS
  Filled 2022-08-19: qty 200

## 2022-08-19 MED ORDER — HEPARIN SOD (PORK) LOCK FLUSH 100 UNIT/ML IV SOLN
500.0000 [IU] | Freq: Once | INTRAVENOUS | Status: AC | PRN
  Administered 2022-08-19: 500 [IU]
  Filled 2022-08-19: qty 5

## 2022-08-19 MED ORDER — SODIUM CHLORIDE 0.9 % IV SOLN
Freq: Once | INTRAVENOUS | Status: AC
  Filled 2022-08-19: qty 250

## 2022-08-19 NOTE — Progress Notes (Signed)
Cancer Center CONSULT NOTE  Patient Care Team: Tower, Audrie Gallus, MD as PCP - General Glory Buff, RN as Oncology Nurse Navigator Earna Coder, MD as Consulting Physician (Internal Medicine)  CHIEF COMPLAINTS/PURPOSE OF CONSULTATION: lung cancer  #  Oncology History Overview Note  JULY 2023- 1. 10 mm mean diameter nodule in the left upper lobe adjacent to an area of pleural and parenchymal scarring.     SEP 2023- 1. Hypermetabolic spiculated nodular left upper lobe pulmonary lesion is compatible with primary bronchogenic neoplasm. 2. Mildly metabolic nodularity in the left hilum, without discrete adenopathy may reflect early nodal disease involvement. Suggest attention on follow-up imaging. 3. No evidence of distant hypermetabolic metastatic disease. 4. Hypermetabolic nodule in the left lobe of the thyroid, Recommend thyroid US and biopsy (ref: J Am Coll Radiol. 2015 Feb;12(2): 143-50). 5. Aortic Atherosclerosis (ICD10-I70.0) and Emphysema (ICD10-J43.9).  # LUNG: Resection  Synchronous Tumors: Not applicable Total Number of Primary Tumors: 1 Procedure: Lobectomy, lung Specimen Laterality: Left Tumor Focality: Unifocal Tumor Site: Upper lobe Tumor Size: 2.6 cm Histologic Type: Squamous cell carcinoma, moderately differentiated Visceral Pleura Invasion: Not identified Direct Invasion of Adjacent Structures: No adjacent structures present Lymphovascular Invasion: Not identified Margins: All margins negative for invasive carcinoma      Closest Margin(s) to Invasive Carcinoma: Bronchovascular margin      Margin(s) Involved by Invasive Carcinoma: Not applicable       Margin Status for Non-Invasive Tumor: Negative Treatment Effect: No known presurgical therapy Regional Lymph Nodes:      Number of Lymph Nodes Involved: 1                           Nodal Sites with Tumor: Level 10      Number of Lymph Nodes Examined: 21                      Nodal Sites  Examined: Levels 4, 5, 7, 9, 10, 11, 12 and 13 Distant Metastasis:      Distant Site(s) Involved: Not applicable Pathologic Stage Classification (pTNM, AJCC 8th Edition): pT1c, pN1 Ancillary Studies: Can be performed upon request Representative Tumor Block: B5 Comment(s): None  # DEC 14th, 2023- Cisplatin-Taxotere #1; D-2 Greggory Keen x4 cycles [finished FEB 2024]  # April 15th, 2024- Keytruda q 3 W x1 year    Lung cancer (HCC)  12/05/2021 Initial Diagnosis   Lung cancer (HCC)   12/18/2021 Cancer Staging   Staging form: Lung, AJCC 8th Edition - Pathologic stage from 12/18/2021: Stage IIB (pT1c, pN1, cM0) - Signed by Loreli Slot, MD on 12/18/2021 Histopathologic type: Squamous cell carcinoma, NOS   Cancer of upper lobe of left lung (HCC)  01/03/2022 Initial Diagnosis   Cancer of upper lobe of left lung (HCC)   01/03/2022 Cancer Staging   Staging form: Lung, AJCC 8th Edition - Pathologic: Stage IIB (pT1c, pN1, cM0) - Signed by Earna Coder, MD on 01/03/2022   02/13/2022 - 04/18/2022 Chemotherapy   Patient is on Treatment Plan : LUNG Cisplatin + Docetaxel q21d     06/16/2022 -  Chemotherapy   Patient is on Treatment Plan : LUNG NSCLC Pembrolizumab (200) q21d      HISTORY OF PRESENTING ILLNESS: Ambulating independently.  Alone.   [name] [name] 72 y.o.  female history of smoking with stage II T2 N1 squamous cell carcinoma left lung cancer currently s/p adjuvant chemotherapy -is here to proceed with  adjuvant  immunotherapy/keytruda.   C/o feet feel numb/swollen, fingers tingle.  Not getting better.   Denies any worsening dyspnea or cough. Otherwise feels good. Only small complaint would be fatigue. Denies pain. Appetite is good. Bowels normal.  Denies any worsening pain.   Review of Systems  Constitutional:  Negative for chills, diaphoresis, fever, malaise/fatigue and weight loss.  HENT:  Negative for nosebleeds and sore throat.   Eyes:  Negative for double  vision.  Respiratory:  Negative for cough, hemoptysis, sputum production, shortness of breath and wheezing.   Cardiovascular:  Negative for chest pain, palpitations, orthopnea and leg swelling.  Gastrointestinal:  Negative for abdominal pain, blood in stool, constipation, diarrhea, heartburn, melena, nausea and vomiting.  Genitourinary:  Negative for dysuria, frequency and urgency.  Musculoskeletal:  Negative for back pain and joint pain.  Skin: Negative.  Negative for itching and rash.  Neurological:  Positive for tingling. Negative for dizziness, focal weakness, weakness and headaches.  Endo/Heme/Allergies:  Does not bruise/bleed easily.  Psychiatric/Behavioral:  Negative for depression. The patient is not nervous/anxious and does not have insomnia.      MEDICAL HISTORY:  Past Medical History:  Diagnosis Date   Arthritis    OA   Carpal tunnel syndrome    Depression    Endometriosis    Fibromyalgia    Hyperlipidemia    Left upper lobe pulmonary nodule 11/2021   Post herpetic neuralgia    Sleep disorder    Tobacco abuse     SURGICAL HISTORY: Past Surgical History:  Procedure Laterality Date   ABDOMINAL HYSTERECTOMY  1992   total endometriosis   APPENDECTOMY  1992   bladder tack  1992   CATARACT EXTRACTION W/PHACO Right 03/25/2018   Procedure: CATARACT EXTRACTION PHACO AND INTRAOCULAR LENS PLACEMENT (IOC) RIGHT;  Surgeon: Elliot Cousin, MD;  Location: [facility] ORS;  Service: Ophthalmology;  Laterality: Right;  [place]  01:11 CDE 8.02 Fluid pack lot # [id number]   CATARACT EXTRACTION W/PHACO Left 04/22/2018   Procedure: CATARACT EXTRACTION PHACO AND INTRAOCULAR LENS PLACEMENT (IOC) Left Eye;  Surgeon: Elliot Cousin, MD;  Location: [facility] ORS;  Service: Ophthalmology;  Laterality: Left;  [place]  01:02 CDE 8.11 Fluid pack lot # [id number]   COLONOSCOPY     FOOT SURGERY Right 04/2016   [facility] in [place] great toe; straightening others   INTERCOSTAL NERVE BLOCK Left 12/12/2021    Procedure: INTERCOSTAL NERVE BLOCK;  Surgeon: Loreli Slot, MD;  Location: [facility] OR;  Service: Thoracic;  Laterality: Left;   IR IMAGING GUIDED PORT INSERTION  02/27/2022   LYMPH NODE DISSECTION Left 12/12/2021   Procedure: LYMPH NODE DISSECTION;  Surgeon: Loreli Slot, MD;  Location: [facility] OR;  Service: Thoracic;  Laterality: Left;   VIDEO BRONCHOSCOPY N/A 12/16/2021   Procedure: VIDEO BRONCHOSCOPY;  Surgeon: Loreli Slot, MD;  Location: [facility] OR;  Service: Thoracic;  Laterality: N/A;    SOCIAL HISTORY: Social History   Socioeconomic History   Marital status: Widowed    Spouse name: Not on file   Number of children: 2   Years of education: Not on file   Highest education level: Not on file  Occupational History   Not on file  Tobacco Use   Smoking status: Former    Packs/day: 1.00    Years: 55.00    Additional pack years: 0.00    Total pack years: 55.00    Types: Cigarettes    Quit date: 12/03/2021    Years since quitting: 0.7  Smokeless tobacco: Never  Vaping Use   Vaping Use: Some days  Substance and Sexual Activity   Alcohol use: No    Alcohol/week: 0.0 standard drinks of alcohol   Drug use: No   Sexual activity: Not Currently  Other Topics Concern   Not on file  Social History Narrative   Lives alone   Social Determinants of Health   Financial Resource Strain: Low Risk  (04/16/2022)   Overall Financial Resource Strain (CARDIA)    Difficulty of Paying Living Expenses: Not hard at all  Food Insecurity: No Food Insecurity (04/16/2022)   Hunger Vital Sign    Worried About Running Out of Food in the Last Year: Never true    Ran Out of Food in the Last Year: Never true  Transportation Needs: No Transportation Needs (04/16/2022)   PRAPARE - Administrator, Civil Service (Medical): No    Lack of Transportation (Non-Medical): No  Physical Activity: Inactive (04/16/2022)   Exercise Vital Sign    Days of Exercise per Week: 0 days    Minutes  of Exercise per Session: 0 min  Stress: No Stress Concern Present (04/16/2022)   [employer] of Occupational Health - Occupational Stress Questionnaire    Feeling of Stress : Not at all  Social Connections: Moderately Integrated (04/16/2022)   Social Connection and Isolation Panel [NHANES]    Frequency of Communication with Friends and Family: More than three times a week    Frequency of Social Gatherings with Friends and Family: More than three times a week    Attends Religious Services: More than 4 times per year    Active Member of [employer] or Organizations: Yes    Attends Banker Meetings: More than 4 times per year    Marital Status: Widowed  Intimate Partner Violence: Not At Risk (04/16/2022)   Humiliation, Afraid, Rape, and Kick questionnaire    Fear of Current or Ex-Partner: No    Emotionally Abused: No    Physically Abused: No    Sexually Abused: No    FAMILY HISTORY: Family History  Problem Relation Age of Onset   Heart disease Mother        MI   Heart disease Father        MI   Arthritis Sister        crippling arthritis   Arthritis Brother        crippling arthritis    ALLERGIES:  has No Known Allergies.  MEDICATIONS:  Current Outpatient Medications  Medication Sig Dispense Refill   acetaminophen (TYLENOL) 500 MG tablet Take 500 mg by mouth every 6 (six) hours as needed.     aspirin 81 MG tablet Take 81 mg by mouth daily.     busPIRone (BUSPAR) 15 MG tablet Take 0.5 tablets (7.5 mg total) by mouth 2 (two) times daily. 90 tablet 2   Cobalamin Combinations (NEURIVA PLUS PO) Take by mouth.     docusate sodium (COLACE) 100 MG capsule Take 200 mg by mouth daily.     DULoxetine (CYMBALTA) 60 MG capsule TAKE 1 CAPSULE BY MOUTH EVERY DAY 90 capsule 1   hydrOXYzine (ATARAX) 10 MG tablet Take 1 tablet (10 mg total) by mouth at bedtime as needed. 30 tablet 0   lidocaine-prilocaine (EMLA) cream Apply on the port. 30 -45 min  prior to port access. 30 g 3    MAGNESIUM CITRATE PO Take by mouth daily.     Melatonin 10 MG CAPS Take 20  mg by mouth at bedtime.     naproxen sodium (ALEVE) 220 MG tablet Take 440 mg by mouth daily as needed (headaches).     pregabalin (LYRICA) 75 MG capsule TAKE 1 CAPSULE BY MOUTH TWICE A DAY 180 capsule 1   senna (SENOKOT) 8.6 MG tablet Take 1 tablet (8.6 mg total) by mouth 2 (two) times daily as needed for constipation. 14 tablet 0   Tiotropium Bromide-Olodaterol (STIOLTO RESPIMAT) 2.5-2.5 MCG/ACT AERS Inhale 2 puffs into the lungs daily. 4 g 0   traMADol (ULTRAM) 50 MG tablet Take 1 tablet (50 mg total) by mouth every 8 (eight) hours as needed. 90 tablet 0   dexamethasone (DECADRON) 4 MG tablet Take 1 tablet (4 mg total) by mouth 2 (two) times daily with a meal. Start 2 days prior to chemo. Take for 2 days; DO NOT take on the day of chemo. (Patient not taking: Reported on 08/19/2022) 60 tablet 0   ondansetron (ZOFRAN) 8 MG tablet One pill every 8 hours as needed for nausea/vomitting. (Patient not taking: Reported on 08/19/2022) 40 tablet 1   prochlorperazine (COMPAZINE) 10 MG tablet Take 1 tablet (10 mg total) by mouth every 6 (six) hours as needed for nausea or vomiting. (Patient not taking: Reported on 08/19/2022) 40 tablet 1   No current facility-administered medications for this visit.   Facility-Administered Medications Ordered in Other Visits  Medication Dose Route Frequency Provider Last Rate Last Admin   0.9 %  sodium chloride infusion   Intravenous Continuous Rushie Chestnut, PA-C   Stopped at 02/17/22 1214   heparin lock flush 100 UNIT/ML injection            sodium chloride flush (NS) 0.9 % injection 10 mL  10 mL Intravenous Once Alinda Dooms, NP          .  PHYSICAL EXAMINATION: ECOG PERFORMANCE STATUS: 0 - Asymptomatic  Vitals:   08/19/22 0904  BP: (!) 143/73  Pulse: 71  Temp: (!) 97.3 F (36.3 C)  SpO2: 99%    Filed Weights   08/19/22 0904  Weight: 178 lb (80.7 kg)     Physical  Exam Vitals and nursing note reviewed.  Constitutional:      Comments:     HENT:     Head: Normocephalic and atraumatic.     Mouth/Throat:     Mouth: Mucous membranes are moist.     Pharynx: No oropharyngeal exudate.  Eyes:     Pupils: Pupils are equal, round, and reactive to light.  Cardiovascular:     Rate and Rhythm: Normal rate and regular rhythm.  Pulmonary:     Effort: No respiratory distress.     Breath sounds: No wheezing.     Comments: Decreased breath sounds bilaterally at bases.  No wheeze or crackles Abdominal:     General: Bowel sounds are normal. There is no distension.     Palpations: Abdomen is soft. There is no mass.     Tenderness: There is no abdominal tenderness. There is no guarding or rebound.  Musculoskeletal:        General: No tenderness. Normal range of motion.     Cervical back: Normal range of motion and neck supple.  Skin:    General: Skin is warm.  Neurological:     Mental Status: She is alert and oriented to person, place, and time.  Psychiatric:        Mood and Affect: Affect normal.  Judgment: Judgment normal.      LABORATORY DATA:  I have reviewed the data as listed Lab Results  Component Value Date   WBC 5.7 08/19/2022   HGB 11.5 (L) 08/19/2022   HCT 34.8 (L) 08/19/2022   MCV 85.7 08/19/2022   PLT 210 08/19/2022   Recent Labs    07/07/22 1040 07/29/22 0952 08/19/22 0909  NA 134* 134* 136  K 4.1 4.3 4.1  CL 103 101 104  CO2 23 26 24   GLUCOSE 115* 112* 101*  BUN 10 13 12   CREATININE 0.53 0.79 0.81  CALCIUM 9.7 9.2 9.1  GFRNONAA >60 >60 >60  PROT 7.2 6.9 6.7  ALBUMIN 3.7 3.6 3.6  AST 18 22 19   ALT 10 16 13   ALKPHOS 120 97 105  BILITOT 0.6 0.4 0.6    RADIOGRAPHIC STUDIES: I have personally reviewed the radiological images as listed and agreed with the findings in the report. No results found.  ASSESSMENT & PLAN:   Cancer of upper lobe of left lung (HCC) # Left upper lobe-lung cancer moderately  differentiated squamous cell carcinoma-pT1pN1- [IIB]; PDL-1 =70%. EGFR status-WILD.  Adjuvant chemotherapy-cisplatin Taxotere x 4 cycles; followed by adjuvant Pembrolizumab x1 year.  s/p 4 cycles of Cis-Taxotere-CT scan March 2024 maturing posttreatment changes from left sided left upper lobe resection with volume loss. Small left effusion. No new developing mass lesion, lymph node enlargement.  # proceed with adjuvant therapy-Keytruda cycle #4/17. Labs today reviewed;  acceptable for treatment today.  APRIl 2024- Thyroid- WNL.   # Skin Itch with out rash- ?- sec to Keytruda- G-2- on benadryl BID; add Atarax 10 qhs- stable.  # PN G-1 -2; from Taxotere. Worse- Discussed re: Trial / accupuncture- recommend increasing lyrica BID- if improved then will give give script.   # Ongoing fatigue : stable.  # # IV Access: port-functional.  pt requesting AM appts-   # DISPOSITION: # keytruda today # follow up in 3 weeks-APP; labs- cbc/cmp; Keytruda- # follow up in 6 weeks- MD; labs- cbc/cmp;TSH; Rande Lawman- Dr.B  All questions were answered. The patient knows to call the clinic with any problems, questions or concerns.    Earna Coder, MD 08/19/2022 9:48 AM

## 2022-08-19 NOTE — Progress Notes (Signed)
C/o feet feel numb/swollen, fingers tingle.

## 2022-08-19 NOTE — Assessment & Plan Note (Addendum)
#   Left upper lobe-lung cancer moderately differentiated squamous cell carcinoma-pT1pN1- [IIB]; PDL-1 =70%. EGFR status-WILD.  Adjuvant chemotherapy-cisplatin Taxotere x 4 cycles; followed by adjuvant Pembrolizumab x1 year.  s/p 4 cycles of Cis-Taxotere-CT scan March 2024 maturing posttreatment changes from left sided left upper lobe resection with volume loss. Small left effusion. No new developing mass lesion, lymph node enlargement.  # proceed with adjuvant therapy-Keytruda cycle #4/17. Labs today reviewed;  acceptable for treatment today.  APRIl 2024- Thyroid- WNL.   # Skin Itch with out rash- ?- sec to Keytruda- G-2- on benadryl BID; add Atarax 10 qhs- stable.  # PN G-1 -2; from Taxotere. Worse- Discussed re: Trial / accupuncture- recommend increasing lyrica BID- if improved then will give give script.   # Ongoing fatigue : stable.  # # IV Access: port-functional.  pt requesting AM appts-   # DISPOSITION: # keytruda today # follow up in 3 weeks-APP; labs- cbc/cmp; Keytruda- # follow up in 6 weeks- MD; labs- cbc/cmp;TSH; Rande Lawman- Dr.B

## 2022-08-19 NOTE — Patient Instructions (Signed)
[facility] [place] [facility]  Discharge Instructions: Thank you for choosing [facility] to provide your oncology and hematology care.  If you have a lab appointment with the Cancer Center, please go directly to the Cancer Center and check in at the registration area.  Wear comfortable clothing and clothing appropriate for easy access to any Portacath or PICC line.   We strive to give you quality time with your provider. You may need to reschedule your appointment if you arrive late (15 or more minutes).  Arriving late affects you and other patients whose appointments are after yours.  Also, if you miss three or more appointments without notifying the office, you may be dismissed from the clinic at the provider's discretion.      For prescription refill requests, have your pharmacy contact our office and allow 72 hours for refills to be completed.     To help prevent nausea and vomiting after your treatment, we encourage you to take your nausea medication as directed.  BELOW ARE SYMPTOMS THAT SHOULD BE REPORTED IMMEDIATELY: *FEVER GREATER THAN 100.4 F (38 C) OR HIGHER *CHILLS OR SWEATING *NAUSEA AND VOMITING THAT IS NOT CONTROLLED WITH YOUR NAUSEA MEDICATION *UNUSUAL SHORTNESS OF BREATH *UNUSUAL BRUISING OR BLEEDING *URINARY PROBLEMS (pain or burning when urinating, or frequent urination) *BOWEL PROBLEMS (unusual diarrhea, constipation, pain near the anus) TENDERNESS IN MOUTH AND THROAT WITH OR WITHOUT PRESENCE OF ULCERS (sore throat, sores in mouth, or a toothache) UNUSUAL RASH, SWELLING OR PAIN  UNUSUAL VAGINAL DISCHARGE OR ITCHING   Items with * indicate a potential emergency and should be followed up as soon as possible or go to the Emergency Department if any problems should occur.  Please show the CHEMOTHERAPY ALERT CARD or IMMUNOTHERAPY ALERT CARD at check-in to the Emergency Department and triage nurse.  Should you have questions after your visit  or need to cancel or reschedule your appointment, please contact [facility] [place] [facility]  [phone] and follow the prompts.  Office hours are 8:00 a.m. to 4:30 p.m. Monday - Friday. Please note that voicemails left after 4:00 p.m. may not be returned until the following business day.  We are closed weekends and major holidays. You have access to a nurse at all times for urgent questions. Please call the main number to the clinic [phone] and follow the prompts.  For any non-urgent questions, you may also contact your provider using MyChart. We now offer e-Visits for anyone 18 and older to request care online for non-urgent symptoms. For details visit [email or web address].   Also download the MyChart app! Go to the app store, search "MyChart", open the app, select [facility], and log in with your MyChart username and password.    

## 2022-08-27 ENCOUNTER — Encounter: Payer: Self-pay | Admitting: Internal Medicine

## 2022-09-01 ENCOUNTER — Ambulatory Visit (INDEPENDENT_AMBULATORY_CARE_PROVIDER_SITE_OTHER): Payer: Medicare Other | Admitting: Primary Care

## 2022-09-01 ENCOUNTER — Encounter: Payer: Self-pay | Admitting: Primary Care

## 2022-09-01 VITALS — BP 122/80 | HR 86 | Temp 98.0°F | Ht 65.0 in | Wt 181.6 lb

## 2022-09-01 DIAGNOSIS — J449 Chronic obstructive pulmonary disease, unspecified: Secondary | ICD-10-CM | POA: Diagnosis not present

## 2022-09-01 DIAGNOSIS — Z720 Tobacco use: Secondary | ICD-10-CM

## 2022-09-01 MED ORDER — NICOTINE 7 MG/24HR TD PT24: 7.0000 mg | MEDICATED_PATCH | Freq: Every day | TRANSDERMAL | 2 refills | Status: DC

## 2022-09-01 NOTE — Assessment & Plan Note (Addendum)
-   Pulmonary function testing showed mild obstructive lung disease with moderate diffusion defect/ FEV1 74% predicted. She experiences dyspnea with moderate-heavy exertion, improved on Stiolto vs Trelegy. No acute wheezing or cough. Recommend patient get annual influenza/covid and RSV vaccine.

## 2022-09-01 NOTE — Progress Notes (Signed)
@Patient  ID: [name] [name], female    DOB: April 14, 1950, 72 y.o.   MRN: [id number]  Chief Complaint  Patient presents with   Follow-up    PFT- occ SOB with exertion.     Referring provider: Tower, Audrie Gallus, MD  HPI: 72 year old female, former smoker. PMH significant for stage 1 mild COPD, lung cancer status post lobectomy, thoracic aortic aneurysm, osteoarthritis, hyperlipidemia, fibromyalgia.  Previous [facility] pulmonary encounter: Stage 1 mild COPD by GOLD classification (HCC) - Patient reports chronic dyspnea symptoms, not acutely worse. No active cough or wheezing. Recommend changing from Trelegy to [employer] Respimat- sample given. Repeat PFTs to compare lung function from previous testing before left upper lobectomy in October 2024. Encourage regular exercise and using IS three times a day.    Shortness of breath - Patient reports dyspnea symptoms with weight gain - Checking BNP, if elevated recommend getting echocardiogram    Cancer of upper lobe of left lung [facility]) - S/p LUL lobectomy in October 2023; Completed chemotherapy February 2024  - Currently undergoing immunotherapy with Keytruda, advised patient notify oncologist about new fatigue, weight gain and dyspnea symptoms since being on medication   09/01/2022 Patient presents today for 1 month follow-up. Accompanied by her daughter. She is doing well today. Breathing is some better since changing from Trelegy to [employer]. She has been using incentive spirometer before going to sleep at night. She only experiences dyspnea with heavy exertion. No active cough or wheezing symptoms. She admits to smoking about 2-3 [place] slim cigarettes a day. She has quit vaping. Pulmonary function testing in June showed minimal obstructive airway disease with moderate diffusion defect.   Pulmonary function testing: 11/25/21 PFT>> 2.44 (83%), FVC 2.0 (90%), ratio 82, DLCO 14.23 (73%)   08/07/22 PFT>> FVC 2.20 (76%), FEV1 1.62 (74%), ratio 74, DLCO 12.17  (63%)      No Known Allergies  Immunization History  Administered Date(s) Administered   Fluad Quad(high Dose 65+) 11/30/2018, 02/07/2020   Influenza Split 01/22/2011, 03/24/2012   Influenza, High Dose Seasonal PF 11/07/2016   Influenza,inj,Quad PF,6+ Mos 11/30/2012, 12/06/2014, 11/22/2015, 11/24/2017   Pneumococcal Conjugate-13 01/02/2015   Pneumococcal Polysaccharide-23 01/22/2011, 09/12/2016   Td 05/01/1998, 03/04/2007    Past Medical History:  Diagnosis Date   Arthritis    OA   Carpal tunnel syndrome    Depression    Endometriosis    Fibromyalgia    Hyperlipidemia    Left upper lobe pulmonary nodule 11/2021   Post herpetic neuralgia    Sleep disorder    Tobacco abuse     Tobacco History: Social History   Tobacco Use  Smoking Status Former   Packs/day: 1.00   Years: 55.00   Additional pack years: 0.00   Total pack years: 55.00   Types: Cigarettes   Quit date: 12/03/2021   Years since quitting: 0.7  Smokeless Tobacco Never   Counseling given: Not Answered   Outpatient Medications Prior to Visit  Medication Sig Dispense Refill   acetaminophen (TYLENOL) 500 MG tablet Take 500 mg by mouth every 6 (six) hours as needed.     aspirin 81 MG tablet Take 81 mg by mouth daily.     busPIRone (BUSPAR) 15 MG tablet Take 0.5 tablets (7.5 mg total) by mouth 2 (two) times daily. 90 tablet 2   Cobalamin Combinations (NEURIVA PLUS PO) Take by mouth.     dexamethasone (DECADRON) 4 MG tablet Take 1 tablet (4 mg total) by mouth 2 (two) times daily  with a meal. Start 2 days prior to chemo. Take for 2 days; DO NOT take on the day of chemo. 60 tablet 0   docusate sodium (COLACE) 100 MG capsule Take 200 mg by mouth daily.     DULoxetine (CYMBALTA) 60 MG capsule TAKE 1 CAPSULE BY MOUTH EVERY DAY 90 capsule 1   hydrOXYzine (ATARAX) 10 MG tablet Take 1 tablet (10 mg total) by mouth at bedtime as needed. 30 tablet 0   lidocaine-prilocaine (EMLA) cream Apply on the port. 30 -45 min   prior to port access. 30 g 3   MAGNESIUM CITRATE PO Take by mouth daily.     Melatonin 10 MG CAPS Take 20 mg by mouth at bedtime.     naproxen sodium (ALEVE) 220 MG tablet Take 440 mg by mouth daily as needed (headaches).     ondansetron (ZOFRAN) 8 MG tablet One pill every 8 hours as needed for nausea/vomitting. 40 tablet 1   pregabalin (LYRICA) 75 MG capsule TAKE 1 CAPSULE BY MOUTH TWICE A DAY 180 capsule 1   prochlorperazine (COMPAZINE) 10 MG tablet Take 1 tablet (10 mg total) by mouth every 6 (six) hours as needed for nausea or vomiting. 40 tablet 1   senna (SENOKOT) 8.6 MG tablet Take 1 tablet (8.6 mg total) by mouth 2 (two) times daily as needed for constipation. 14 tablet 0   Tiotropium Bromide-Olodaterol (STIOLTO RESPIMAT) 2.5-2.5 MCG/ACT AERS Inhale 2 puffs into the lungs daily. 4 g 0   traMADol (ULTRAM) 50 MG tablet Take 1 tablet (50 mg total) by mouth every 8 (eight) hours as needed. 90 tablet 0   Facility-Administered Medications Prior to Visit  Medication Dose Route Frequency Provider Last Rate Last Admin   0.9 %  sodium chloride infusion   Intravenous Continuous Rushie Chestnut, PA-C   Stopped at 02/17/22 1214   heparin lock flush 100 UNIT/ML injection            sodium chloride flush (NS) 0.9 % injection 10 mL  10 mL Intravenous Once Alinda Dooms, NP          Review of Systems  Review of Systems  Constitutional: Negative.   HENT: Negative.    Respiratory:  Positive for shortness of breath. Negative for cough, chest tightness and wheezing.   Cardiovascular: Negative.      Physical Exam  BP 122/80 (BP Location: Left Arm, Cuff Size: Normal)   Pulse 86   Temp 98 F (36.7 C) (Temporal)   Ht 5\' 5"  (1.651 m)   Wt 181 lb 9.6 oz (82.4 kg)   SpO2 96%   BMI 30.22 kg/m  Physical Exam Constitutional:      General: She is not in acute distress.    Appearance: Normal appearance. She is not ill-appearing.     Comments: Appears well  HENT:     Mouth/Throat:      Mouth: Mucous membranes are moist.  Cardiovascular:     Rate and Rhythm: Normal rate and regular rhythm.  Pulmonary:     Effort: Pulmonary effort is normal. No respiratory distress.     Breath sounds: Normal breath sounds. No wheezing, rhonchi or rales.     Comments: No respiratory distress Musculoskeletal:        General: Normal range of motion.  Skin:    General: Skin is warm and dry.  Neurological:     General: No focal deficit present.     Mental Status: She is oriented to person, place,  and time. Mental status is at baseline.  Psychiatric:        Mood and Affect: Mood normal.        Behavior: Behavior normal.        Thought Content: Thought content normal.        Judgment: Judgment normal.      Lab Results:  CBC    Component Value Date/Time   WBC 5.7 08/19/2022 0909   WBC 10.2 04/16/2022 0826   RBC 4.06 08/19/2022 0909   HGB 11.5 (L) 08/19/2022 0909   HCT 34.8 (L) 08/19/2022 0909   PLT 210 08/19/2022 0909   MCV 85.7 08/19/2022 0909   MCH 28.3 08/19/2022 0909   MCHC 33.0 08/19/2022 0909   RDW 14.4 08/19/2022 0909   LYMPHSABS 1.6 08/19/2022 0909   MONOABS 0.5 08/19/2022 0909   EOSABS 0.3 08/19/2022 0909   BASOSABS 0.0 08/19/2022 0909    BMET    Component Value Date/Time   NA 136 08/19/2022 0909   K 4.1 08/19/2022 0909   CL 104 08/19/2022 0909   CO2 24 08/19/2022 0909   GLUCOSE 101 (H) 08/19/2022 0909   BUN 12 08/19/2022 0909   CREATININE 0.81 08/19/2022 0909   CALCIUM 9.1 08/19/2022 0909   GFRNONAA >60 08/19/2022 0909   GFRAA 83 01/29/2007 0930    BNP    Component Value Date/Time   BNP 45.0 08/01/2022 1535    ProBNP No results found for: "PROBNP"  Imaging: No results found.   Assessment & Plan:   Stage 1 mild COPD by GOLD classification (HCC) - Pulmonary function testing showed mild obstructive lung disease with moderate diffusion defect/ FEV1 74% predicted. She experiences dyspnea with moderate-heavy exertion, improved on Stiolto vs  Trelegy. No acute wheezing or cough. Recommend patient get annual influenza/covid and RSV vaccine.  Tobacco abuse - Current smoker, today is her quit date. RX nicotine patch. - FU in 1 month for smoking cessation   Glenford Bayley, NP 09/01/2022

## 2022-09-01 NOTE — Assessment & Plan Note (Addendum)
-   Current smoker, today is her quit date. RX nicotine patch. - FU in 1 month for smoking cessation

## 2022-09-01 NOTE — Patient Instructions (Signed)
Today is your quit date!!!  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 It can affect almost every organ in the body. Smoking puts you and people around you at risk for many serious, long-lasting (chronic) diseases. Quitting smoking can be hard, but it is one of the best things that you can do for your health. It is never too late to quit. Do not give up if you cannot quit the first time. Some people need to try many times to quit. Do your best to stick to your quit plan, and talk with your doctor if you have any questions or concerns. How do I get ready to quit? Pick a date to quit. Set a date within the next 2 weeks to give you time to prepare. Write down the reasons why you are quitting. Keep this list in places where you will see it often. Tell your family, friends, and co-workers that you are quitting. Their support is important. Talk with your doctor about the choices that may help you quit. Find out if your health insurance will pay for these treatments. Know the people, places, things, and activities that make you want to smoke (triggers). Avoid them. What first steps can I take to quit smoking? Throw away all cigarettes at home, at work, and in your car. Throw away the things that you use when you smoke, such as ashtrays and lighters. Clean your car. Empty the ashtray. Clean your home, including curtains and carpets. What can I do to help me quit smoking? Talk with your doctor about taking medicines and  seeing a counselor. You are more likely to succeed when you do both. If you are pregnant or breastfeeding: Talk with your doctor about counseling or other ways to quit smoking. Do not take medicine to help you quit smoking unless your doctor tells you to. Quit right away Quit smoking completely, instead of slowly cutting back on how much you smoke over a period of time. Stopping smoking right away may be more successful than slowly quitting. Go to counseling. In-person is best if this is an option. You are more likely to quit if you go to counseling sessions regularly. Take medicine You may take medicines to help you quit. Some medicines need a prescription, and some you can buy over-the-counter. Some medicines may contain a drug called nicotine to replace the nicotine in cigarettes. Medicines may: Help you stop having the desire to smoke (cravings). Help to stop the problems that come when you stop smoking (withdrawal symptoms). Your doctor may ask you to use: Nicotine patches, gum, or lozenges. Nicotine inhalers or sprays. Non-nicotine medicine that you take by mouth. Find resources Find resources and other ways to help you quit smoking and remain smoke-free after you quit. They include: Online chats with a Veterinary surgeon. Phone quitlines. Printed Materials engineer. Support groups or group counseling. Text messaging programs. Mobile phone apps. Use apps on your mobile phone or tablet that can help you stick to your quit plan. Examples  of free services include Quit Guide from the [employer] and smokefree.gov  What can I do to make it easier to quit?  Talk to your family and friends. Ask them to support and encourage you. Call a phone quitline, such as 1-800-QUIT-NOW, reach out to support groups, or work with a Veterinary surgeon. Ask people who smoke to not smoke around you. Avoid places that make you want to smoke, such as: Bars. Parties. Smoke-break areas at work. Spend time with people who do not  smoke. Lower the stress in your life. Stress can make you want to smoke. Try these things to lower stress: Getting regular exercise. Doing deep-breathing exercises. Doing yoga. Meditating. What benefits will I see if I quit smoking? Over time, you may have: A better sense of smell and taste. Less coughing and sore throat. A slower heart rate. Lower blood pressure. Clearer skin. Better breathing. Fewer sick days. Summary Quitting smoking can be hard, but it is one of the best things that you can do for your health. Do not give up if you cannot quit the first time. Some people need to try many times to quit. When you decide to quit smoking, make a plan to help you succeed. Quit smoking right away, not slowly over a period of time. When you start quitting, get help and support to keep you smoke-free. This information is not intended to replace advice given to you by your health care provider. Make sure you discuss any questions you have with your health care provider. Document Revised: 02/08/2021 Document Reviewed: 02/08/2021 Elsevier Patient Education  2024 [employer].

## 2022-09-09 ENCOUNTER — Inpatient Hospital Stay: Payer: Medicare Other | Admitting: Nurse Practitioner

## 2022-09-09 ENCOUNTER — Inpatient Hospital Stay: Payer: Medicare Other | Attending: Internal Medicine

## 2022-09-09 ENCOUNTER — Encounter: Payer: Self-pay | Admitting: Nurse Practitioner

## 2022-09-09 ENCOUNTER — Telehealth: Payer: Self-pay | Admitting: *Deleted

## 2022-09-09 ENCOUNTER — Inpatient Hospital Stay: Payer: Medicare Other

## 2022-09-09 VITALS — BP 149/86 | HR 70 | Temp 97.8°F | Wt 181.0 lb

## 2022-09-09 DIAGNOSIS — C3412 Malignant neoplasm of upper lobe, left bronchus or lung: Secondary | ICD-10-CM

## 2022-09-09 DIAGNOSIS — R5383 Other fatigue: Secondary | ICD-10-CM | POA: Insufficient documentation

## 2022-09-09 DIAGNOSIS — Z87891 Personal history of nicotine dependence: Secondary | ICD-10-CM | POA: Insufficient documentation

## 2022-09-09 DIAGNOSIS — G620 Drug-induced polyneuropathy: Secondary | ICD-10-CM | POA: Diagnosis not present

## 2022-09-09 DIAGNOSIS — T451X5A Adverse effect of antineoplastic and immunosuppressive drugs, initial encounter: Secondary | ICD-10-CM | POA: Diagnosis not present

## 2022-09-09 DIAGNOSIS — Z5112 Encounter for antineoplastic immunotherapy: Secondary | ICD-10-CM | POA: Diagnosis not present

## 2022-09-09 DIAGNOSIS — L299 Pruritus, unspecified: Secondary | ICD-10-CM | POA: Insufficient documentation

## 2022-09-09 DIAGNOSIS — Z79620 Long term (current) use of immunosuppressive biologic: Secondary | ICD-10-CM | POA: Insufficient documentation

## 2022-09-09 DIAGNOSIS — Z79899 Other long term (current) drug therapy: Secondary | ICD-10-CM | POA: Insufficient documentation

## 2022-09-09 LAB — CBC WITH DIFFERENTIAL (CANCER CENTER ONLY)
Abs Immature Granulocytes: 0.02 10*3/uL (ref 0.00–0.07)
Basophils Absolute: 0 10*3/uL (ref 0.0–0.1)
Basophils Relative: 0 %
Eosinophils Absolute: 0.2 10*3/uL (ref 0.0–0.5)
Eosinophils Relative: 4 %
HCT: 37.6 % (ref 36.0–46.0)
Hemoglobin: 12.2 g/dL (ref 12.0–15.0)
Immature Granulocytes: 0 %
Lymphocytes Relative: 30 %
Lymphs Abs: 1.7 10*3/uL (ref 0.7–4.0)
MCH: 28.1 pg (ref 26.0–34.0)
MCHC: 32.4 g/dL (ref 30.0–36.0)
MCV: 86.6 fL (ref 80.0–100.0)
Monocytes Absolute: 0.6 10*3/uL (ref 0.1–1.0)
Monocytes Relative: 10 %
Neutro Abs: 3.1 10*3/uL (ref 1.7–7.7)
Neutrophils Relative %: 56 %
Platelet Count: 239 10*3/uL (ref 150–400)
RBC: 4.34 MIL/uL (ref 3.87–5.11)
RDW: 14.6 % (ref 11.5–15.5)
WBC Count: 5.7 10*3/uL (ref 4.0–10.5)
nRBC: 0 % (ref 0.0–0.2)

## 2022-09-09 LAB — CMP (CANCER CENTER ONLY)
ALT: 14 U/L (ref 0–44)
AST: 19 U/L (ref 15–41)
Albumin: 3.9 g/dL (ref 3.5–5.0)
Alkaline Phosphatase: 128 U/L — ABNORMAL HIGH (ref 38–126)
Anion gap: 7 (ref 5–15)
BUN: 23 mg/dL (ref 8–23)
CO2: 26 mmol/L (ref 22–32)
Calcium: 9.4 mg/dL (ref 8.9–10.3)
Chloride: 101 mmol/L (ref 98–111)
Creatinine: 0.86 mg/dL (ref 0.44–1.00)
GFR, Estimated: >60 mL/min (ref >60)
Glucose, Bld: 101 mg/dL — ABNORMAL HIGH (ref 70–99)
Potassium: 4.5 mmol/L (ref 3.5–5.1)
Sodium: 134 mmol/L — ABNORMAL LOW (ref 135–145)
Total Bilirubin: 0.3 mg/dL (ref 0.3–1.2)
Total Protein: 7.2 g/dL (ref 6.5–8.1)

## 2022-09-09 MED ORDER — SODIUM CHLORIDE 0.9 % IV SOLN
INTRAVENOUS | Status: DC
  Filled 2022-09-09: qty 250

## 2022-09-09 MED ORDER — HEPARIN SOD (PORK) LOCK FLUSH 100 UNIT/ML IV SOLN
500.0000 [IU] | Freq: Once | INTRAVENOUS | Status: AC | PRN
  Administered 2022-09-09: 500 [IU]
  Filled 2022-09-09: qty 5

## 2022-09-09 MED ORDER — SODIUM CHLORIDE 0.9 % IV SOLN
200.0000 mg | Freq: Once | INTRAVENOUS | Status: AC
  Administered 2022-09-09: 200 mg via INTRAVENOUS
  Filled 2022-09-09: qty 200

## 2022-09-09 MED ORDER — HYDROXYZINE HCL 10 MG PO TABS: 10.0000 mg | ORAL_TABLET | Freq: Two times a day (BID) | ORAL | 1 refills | Status: AC | PRN

## 2022-09-09 MED ORDER — SODIUM CHLORIDE 0.9 % IV SOLN
Freq: Once | INTRAVENOUS | Status: AC
  Filled 2022-09-09: qty 250

## 2022-09-09 NOTE — Progress Notes (Signed)
[facility] CONSULT NOTE  Patient Care Team: Tower, Audrie Gallus, MD as PCP - General Glory Buff, RN as Oncology Nurse Navigator Earna Coder, MD as Consulting Physician (Internal Medicine)  CHIEF COMPLAINTS/PURPOSE OF CONSULTATION: lung cancer  Oncology History Overview Note  JULY 2023- 1. 10 mm mean diameter nodule in the left upper lobe adjacent to an area of pleural and parenchymal scarring.     SEP 2023- 1. Hypermetabolic spiculated nodular left upper lobe pulmonary lesion is compatible with primary bronchogenic neoplasm. 2. Mildly metabolic nodularity in the left hilum, without discrete adenopathy may reflect early nodal disease involvement. Suggest attention on follow-up imaging. 3. No evidence of distant hypermetabolic metastatic disease. 4. Hypermetabolic nodule in the left lobe of the thyroid, Recommend thyroid US and biopsy (ref: J Am Coll Radiol. 2015 Feb;12(2): 143-50). 5. Aortic Atherosclerosis (ICD10-I70.0) and Emphysema (ICD10-J43.9).  # LUNG: Resection  Synchronous Tumors: Not applicable Total Number of Primary Tumors: 1 Procedure: Lobectomy, lung Specimen Laterality: Left Tumor Focality: Unifocal Tumor Site: Upper lobe Tumor Size: 2.6 cm Histologic Type: Squamous cell carcinoma, moderately differentiated Visceral Pleura Invasion: Not identified Direct Invasion of Adjacent Structures: No adjacent structures present Lymphovascular Invasion: Not identified Margins: All margins negative for invasive carcinoma      Closest Margin(s) to Invasive Carcinoma: Bronchovascular margin      Margin(s) Involved by Invasive Carcinoma: Not applicable       Margin Status for Non-Invasive Tumor: Negative Treatment Effect: No known presurgical therapy Regional Lymph Nodes:      Number of Lymph Nodes Involved: 1                           Nodal Sites with Tumor: Level 10      Number of Lymph Nodes Examined: 21                      Nodal Sites  Examined: Levels 4, 5, 7, 9, 10, 11, 12 and 13 Distant Metastasis:      Distant Site(s) Involved: Not applicable Pathologic Stage Classification (pTNM, AJCC 8th Edition): pT1c, pN1 Ancillary Studies: Can be performed upon request Representative Tumor Block: B5 Comment(s): None  # DEC 14th, 2023- Cisplatin-Taxotere #1; D-2 Greggory Keen x4 cycles [finished FEB 2024]  # April 15th, 2024- Keytruda q 3 W x1 year    Lung cancer (HCC)  12/05/2021 Initial Diagnosis   Lung cancer (HCC)   12/18/2021 Cancer Staging   Staging form: Lung, AJCC 8th Edition - Pathologic stage from 12/18/2021: Stage IIB (pT1c, pN1, cM0) - Signed by Loreli Slot, MD on 12/18/2021 Histopathologic type: Squamous cell carcinoma, NOS   Cancer of upper lobe of left lung (HCC)  01/03/2022 Initial Diagnosis   Cancer of upper lobe of left lung (HCC)   01/03/2022 Cancer Staging   Staging form: Lung, AJCC 8th Edition - Pathologic: Stage IIB (pT1c, pN1, cM0) - Signed by Earna Coder, MD on 01/03/2022   02/13/2022 - 04/18/2022 Chemotherapy   Patient is on Treatment Plan : LUNG Cisplatin + Docetaxel q21d     06/16/2022 -  Chemotherapy   Patient is on Treatment Plan : LUNG NSCLC Pembrolizumab (200) q21d      HISTORY OF PRESENTING ILLNESS: Ambulating independently.  Alone.   [name] [name] 72 y.o. female history of smoking with stage II T2 N1 squamous cell carcinoma left lung cancer currently s/p adjuvant chemotherapy, currently on adjuvant keytruda, who returns to clinic  for consideration of treatment. She has ongoing fatigue and numbness/tingling of the fingers but no worse. No increased itching or rash. No cough or shortness of breath.   Review of Systems  Constitutional:  Negative for chills, diaphoresis, fever, malaise/fatigue and weight loss.  HENT:  Negative for nosebleeds and sore throat.   Eyes:  Negative for double vision.  Respiratory:  Negative for cough, hemoptysis, sputum production, shortness of  breath and wheezing.   Cardiovascular:  Negative for chest pain, palpitations, orthopnea and leg swelling.  Gastrointestinal:  Negative for abdominal pain, blood in stool, constipation, diarrhea, heartburn, melena, nausea and vomiting.  Genitourinary:  Negative for dysuria, frequency and urgency.  Musculoskeletal:  Negative for back pain and joint pain.  Skin: Negative.  Negative for itching and rash.  Neurological:  Positive for tingling. Negative for dizziness, focal weakness, weakness and headaches.  Endo/Heme/Allergies:  Does not bruise/bleed easily.  Psychiatric/Behavioral:  Negative for depression. The patient is not nervous/anxious and does not have insomnia.      MEDICAL HISTORY:  Past Medical History:  Diagnosis Date   Arthritis    OA   Carpal tunnel syndrome    Depression    Endometriosis    Fibromyalgia    Hyperlipidemia    Left upper lobe pulmonary nodule 11/2021   Post herpetic neuralgia    Sleep disorder    Tobacco abuse     SURGICAL HISTORY: Past Surgical History:  Procedure Laterality Date   ABDOMINAL HYSTERECTOMY  1992   total endometriosis   APPENDECTOMY  1992   bladder tack  1992   CATARACT EXTRACTION W/PHACO Right 03/25/2018   Procedure: CATARACT EXTRACTION PHACO AND INTRAOCULAR LENS PLACEMENT (IOC) RIGHT;  Surgeon: Elliot Cousin, MD;  Location: [facility] ORS;  Service: Ophthalmology;  Laterality: Right;  [place]  01:11 CDE 8.02 Fluid pack lot # [id number]   CATARACT EXTRACTION W/PHACO Left 04/22/2018   Procedure: CATARACT EXTRACTION PHACO AND INTRAOCULAR LENS PLACEMENT (IOC) Left Eye;  Surgeon: Elliot Cousin, MD;  Location: [facility] ORS;  Service: Ophthalmology;  Laterality: Left;  [place]  01:02 CDE 8.11 Fluid pack lot # [id number]   COLONOSCOPY     FOOT SURGERY Right 04/2016   [facility] in [place] great toe; straightening others   INTERCOSTAL NERVE BLOCK Left 12/12/2021   Procedure: INTERCOSTAL NERVE BLOCK;  Surgeon: Loreli Slot, MD;  Location: [facility]  OR;  Service: Thoracic;  Laterality: Left;   IR IMAGING GUIDED PORT INSERTION  02/27/2022   LYMPH NODE DISSECTION Left 12/12/2021   Procedure: LYMPH NODE DISSECTION;  Surgeon: Loreli Slot, MD;  Location: [facility] OR;  Service: Thoracic;  Laterality: Left;   VIDEO BRONCHOSCOPY N/A 12/16/2021   Procedure: VIDEO BRONCHOSCOPY;  Surgeon: Loreli Slot, MD;  Location: [facility] OR;  Service: Thoracic;  Laterality: N/A;    SOCIAL HISTORY: Social History   Socioeconomic History   Marital status: Widowed    Spouse name: Not on file   Number of children: 2   Years of education: Not on file   Highest education level: Not on file  Occupational History   Not on file  Tobacco Use   Smoking status: Former    Packs/day: 1.00    Years: 55.00    Additional pack years: 0.00    Total pack years: 55.00    Types: Cigarettes    Quit date: 12/03/2021    Years since quitting: 0.7   Smokeless tobacco: Never  Vaping Use   Vaping Use: Some days  Substance and  Sexual Activity   Alcohol use: No    Alcohol/week: 0.0 standard drinks of alcohol   Drug use: No   Sexual activity: Not Currently  Other Topics Concern   Not on file  Social History Narrative   Lives alone   Social Determinants of Health   Financial Resource Strain: Low Risk  (04/16/2022)   Overall Financial Resource Strain (CARDIA)    Difficulty of Paying Living Expenses: Not hard at all  Food Insecurity: No Food Insecurity (04/16/2022)   Hunger Vital Sign    Worried About Running Out of Food in the Last Year: Never true    Ran Out of Food in the Last Year: Never true  Transportation Needs: No Transportation Needs (04/16/2022)   PRAPARE - Administrator, Civil Service (Medical): No    Lack of Transportation (Non-Medical): No  Physical Activity: Inactive (04/16/2022)   Exercise Vital Sign    Days of Exercise per Week: 0 days    Minutes of Exercise per Session: 0 min  Stress: No Stress Concern Present (04/16/2022)    [employer] of Occupational Health - Occupational Stress Questionnaire    Feeling of Stress : Not at all  Social Connections: Moderately Integrated (04/16/2022)   Social Connection and Isolation Panel [NHANES]    Frequency of Communication with Friends and Family: More than three times a week    Frequency of Social Gatherings with Friends and Family: More than three times a week    Attends Religious Services: More than 4 times per year    Active Member of [employer] or Organizations: Yes    Attends Banker Meetings: More than 4 times per year    Marital Status: Widowed  Intimate Partner Violence: Not At Risk (04/16/2022)   Humiliation, Afraid, Rape, and Kick questionnaire    Fear of Current or Ex-Partner: No    Emotionally Abused: No    Physically Abused: No    Sexually Abused: No    FAMILY HISTORY: Family History  Problem Relation Age of Onset   Heart disease Mother        MI   Heart disease Father        MI   Arthritis Sister        crippling arthritis   Arthritis Brother        crippling arthritis    ALLERGIES:  has No Known Allergies.  MEDICATIONS:  Current Outpatient Medications  Medication Sig Dispense Refill   acetaminophen (TYLENOL) 500 MG tablet Take 500 mg by mouth every 6 (six) hours as needed.     aspirin 81 MG tablet Take 81 mg by mouth daily.     busPIRone (BUSPAR) 15 MG tablet Take 0.5 tablets (7.5 mg total) by mouth 2 (two) times daily. 90 tablet 2   Cobalamin Combinations (NEURIVA PLUS PO) Take by mouth.     dexamethasone (DECADRON) 4 MG tablet Take 1 tablet (4 mg total) by mouth 2 (two) times daily with a meal. Start 2 days prior to chemo. Take for 2 days; DO NOT take on the day of chemo. 60 tablet 0   docusate sodium (COLACE) 100 MG capsule Take 200 mg by mouth daily.     DULoxetine (CYMBALTA) 60 MG capsule TAKE 1 CAPSULE BY MOUTH EVERY DAY 90 capsule 1   hydrOXYzine (ATARAX) 10 MG tablet Take 1 tablet (10 mg total) by mouth at bedtime as  needed. 30 tablet 0   lidocaine-prilocaine (EMLA) cream Apply on the port. 30 -45  min  prior to port access. 30 g 3   MAGNESIUM CITRATE PO Take by mouth daily.     Melatonin 10 MG CAPS Take 20 mg by mouth at bedtime.     naproxen sodium (ALEVE) 220 MG tablet Take 440 mg by mouth daily as needed (headaches).     nicotine (NICODERM CQ - DOSED IN MG/24 HR) 7 mg/24hr patch Place 1 patch (7 mg total) onto the skin daily. 28 patch 2   ondansetron (ZOFRAN) 8 MG tablet One pill every 8 hours as needed for nausea/vomitting. 40 tablet 1   pregabalin (LYRICA) 75 MG capsule TAKE 1 CAPSULE BY MOUTH TWICE A DAY 180 capsule 1   prochlorperazine (COMPAZINE) 10 MG tablet Take 1 tablet (10 mg total) by mouth every 6 (six) hours as needed for nausea or vomiting. 40 tablet 1   senna (SENOKOT) 8.6 MG tablet Take 1 tablet (8.6 mg total) by mouth 2 (two) times daily as needed for constipation. 14 tablet 0   Tiotropium Bromide-Olodaterol (STIOLTO RESPIMAT) 2.5-2.5 MCG/ACT AERS Inhale 2 puffs into the lungs daily. 4 g 0   traMADol (ULTRAM) 50 MG tablet Take 1 tablet (50 mg total) by mouth every 8 (eight) hours as needed. 90 tablet 0   No current facility-administered medications for this visit.   Facility-Administered Medications Ordered in Other Visits  Medication Dose Route Frequency Provider Last Rate Last Admin   0.9 %  sodium chloride infusion   Intravenous Continuous Rushie Chestnut, PA-C   Stopped at 02/17/22 1214   heparin lock flush 100 UNIT/ML injection            sodium chloride flush (NS) 0.9 % injection 10 mL  10 mL Intravenous Once Alinda Dooms, NP         PHYSICAL EXAMINATION: ECOG PERFORMANCE STATUS: 0 - Asymptomatic Vitals:   09/09/22 1028  BP: (!) 149/86  Pulse: 70  Temp: 97.8 F (36.6 C)   Filed Weights   09/09/22 1028  Weight: 181 lb (82.1 kg)   Physical Exam Constitutional:      Appearance: She is not ill-appearing.  Eyes:     General: No scleral icterus.     Conjunctiva/sclera: Conjunctivae normal.  Cardiovascular:     Rate and Rhythm: Normal rate and regular rhythm.  Pulmonary:     Effort: No respiratory distress.     Comments: Diminished bilaterally Abdominal:     General: There is no distension.     Palpations: Abdomen is soft.     Tenderness: There is no abdominal tenderness. There is no guarding.  Musculoskeletal:        General: No deformity.     Right lower leg: No edema.     Left lower leg: No edema.  Lymphadenopathy:     Cervical: No cervical adenopathy.  Skin:    General: Skin is warm and dry.  Neurological:     Mental Status: She is alert and oriented to person, place, and time.  Psychiatric:        Mood and Affect: Mood normal.        Behavior: Behavior normal.    LABORATORY DATA:  I have reviewed the data as listed Lab Results  Component Value Date   WBC 5.7 09/09/2022   HGB 12.2 09/09/2022   HCT 37.6 09/09/2022   MCV 86.6 09/09/2022   PLT 239 09/09/2022   Recent Labs    07/29/22 0952 08/19/22 0909 09/09/22 1019  NA 134* 136 134*  K  4.3 4.1 4.5  CL 101 104 101  CO2 26 24 26   GLUCOSE 112* 101* 101*  BUN 13 12 23   CREATININE 0.79 0.81 0.86  CALCIUM 9.2 9.1 9.4  GFRNONAA >60 >60 >60  PROT 6.9 6.7 7.2  ALBUMIN 3.6 3.6 3.9  AST 22 19 19   ALT 16 13 14   ALKPHOS 97 105 128*  BILITOT 0.4 0.6 0.3     RADIOGRAPHIC STUDIES: I have personally reviewed the radiological images as listed and agreed with the findings in the report. No results found.  ASSESSMENT & PLAN:   # SCC of Left Upper Lobe- moderately differentiated. pT1pN1- [IIB]; PDL-1 =70%. EGFR status- WILD. Adjuvant chemotherapy-cisplatin Taxotere x 4 cycles; followed by adjuvant Pembrolizumab x 1 year. CT scan March 2024 post chemotherapy showed maturing posttreatment changes from left sided left upper lobe resection with volume loss. Small left effusion. No new developing mass lesion, lymph node enlargement.  Tolerating adjuvant keytruda well  without significant side effects. Labs today reviewed and acceptable for treatment. Proceed with Keytruda today. TSH 3.7 (May 2024).    # Pruritus- no rash. Dry Skin? Keytruda? On benadryl BID. Atarax 10 mg at bedtime improved. Monitor.   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 PN G-1 -2; from Taxotere. On duloxetine per pcp and lyrica. No improvement. Trial acupuncture.    # Ongoing fatigue : stable.   # IV Access: port-functional.   pt requesting AM appts-    # DISPOSITION: # keytruda today # 3 weeks- labs- cbc/cmp/TSH; dr Ethelene Browns- [place]  No problem-specific Assessment & Plan notes found for this encounter.  All questions were answered. The patient knows to call the clinic with any problems, questions or concerns.    Alinda Dooms, NP 09/09/2022

## 2022-09-09 NOTE — Telephone Encounter (Signed)
Refill sent to pharmacy for atarax.

## 2022-09-09 NOTE — Patient Instructions (Signed)
[facility]  Discharge Instructions: Thank you for choosing [facility] to provide your oncology and hematology care.  If you have a lab appointment with the Cancer Center, please go directly to the Cancer Center and check in at the registration area.  Wear comfortable clothing and clothing appropriate for easy access to any Portacath or PICC line.   We strive to give you quality time with your provider. You may need to reschedule your appointment if you arrive late (15 or more minutes).  Arriving late affects you and other patients whose appointments are after yours.  Also, if you miss three or more appointments without notifying the office, you may be dismissed from the clinic at the provider's discretion.      For prescription refill requests, have your pharmacy contact our office and allow 72 hours for refills to be completed.    Today you received the following chemotherapy and/or immunotherapy agents KEYTRUDA      To help prevent nausea and vomiting after your treatment, we encourage you to take your nausea medication as directed.  Pembrolizumab Injection What is this medication? PEMBROLIZUMAB (PEM broe LIZ ue mab) treats some types of cancer. It works by helping your immune system slow or stop the spread of cancer cells. It is a monoclonal antibody. This medicine may be used for other purposes; ask your health care provider or pharmacist if you have questions. COMMON BRAND NAME(S): Keytruda What should I tell my care team before I take this medication? They need to know if you have any of these conditions: Allogeneic stem cell transplant (uses someone else's stem cells) Autoimmune diseases, such as Crohn disease, ulcerative colitis, lupus History of chest radiation Nervous system problems, such as Guillain-Barre syndrome, myasthenia gravis Organ transplant An unusual or allergic reaction to pembrolizumab, other medications, foods,  dyes, or preservatives Pregnant or trying to get pregnant Breast-feeding How should I use this medication? This medication is injected into a vein. It is given by your care team in a hospital or clinic setting. A special MedGuide will be given to you before each treatment. Be sure to read this information carefully each time. Talk to your care team about the use of this medication in children. While it may be prescribed for children as young as 6 months for selected conditions, precautions do apply. Overdosage: If you think you have taken too much of this medicine contact a poison control center or emergency room at once. NOTE: This medicine is only for you. Do not share this medicine with others. What if I miss a dose? Keep appointments for follow-up doses. It is important not to miss your dose. Call your care team if you are unable to keep an appointment. What may interact with this medication? Interactions have not been studied. This list may not describe all possible interactions. Give your health care provider a list of all the medicines, herbs, non-prescription drugs, or dietary supplements you use. Also tell them if you smoke, drink alcohol, or use illegal drugs. Some items may interact with your medicine. What should I watch for while using this medication? Your condition will be monitored carefully while you are receiving this medication. You may need blood work while taking this medication. This medication may cause serious skin reactions. They can happen weeks to months after starting the medication. Contact your care team right away if you notice fevers or flu-like symptoms with a rash. The rash may be red or purple  and then turn into blisters or peeling of the skin. You may also notice a red rash with swelling of the face, lips, or lymph nodes in your neck or under your arms. Tell your care team right away if you have any change in your eyesight. Talk to your care team if you may be  pregnant. Serious birth defects can occur if you take this medication during pregnancy and for 4 months after the last dose. You will need a negative pregnancy test before starting this medication. Contraception is recommended while taking this medication and for 4 months after the last dose. Your care team can help you find the option that works for you. Do not breastfeed while taking this medication and for 4 months after the last dose. What side effects may I notice from receiving this medication? Side effects that you should report to your care team as soon as possible: Allergic reactions--skin rash, itching, hives, swelling of the face, lips, tongue, or throat Dry cough, shortness of breath or trouble breathing Eye pain, redness, irritation, or discharge with blurry or decreased vision Heart muscle inflammation--unusual weakness or fatigue, shortness of breath, chest pain, fast or irregular heartbeat, dizziness, swelling of the ankles, feet, or hands Hormone gland problems--headache, sensitivity to light, unusual weakness or fatigue, dizziness, fast or irregular heartbeat, increased sensitivity to cold or heat, excessive sweating, constipation, hair loss, increased thirst or amount of urine, tremors or shaking, irritability Infusion reactions--chest pain, shortness of breath or trouble breathing, feeling faint or lightheaded Kidney injury (glomerulonephritis)--decrease in the amount of urine, red or dark brown urine, foamy or bubbly urine, swelling of the ankles, hands, or feet Liver injury--right upper belly pain, loss of appetite, nausea, light-colored stool, dark yellow or brown urine, yellowing skin or eyes, unusual weakness or fatigue Pain, tingling, or numbness in the hands or feet, muscle weakness, change in vision, confusion or trouble speaking, loss of balance or coordination, trouble walking, seizures Rash, fever, and swollen lymph nodes Redness, blistering, peeling, or loosening of the  skin, including inside the mouth Sudden or severe stomach pain, bloody diarrhea, fever, nausea, vomiting Side effects that usually do not require medical attention (report to your care team if they continue or are bothersome): Bone, joint, or muscle pain Diarrhea Fatigue Loss of appetite Nausea Skin rash This list may not describe all possible side effects. Call your doctor for medical advice about side effects. You may report side effects to FDA at 1-800-FDA-1088. Where should I keep my medication? This medication is given in a hospital or clinic. It will not be stored at home. NOTE: This sheet is a summary. It may not cover all possible information. If you have questions about this medicine, talk to your doctor, pharmacist, or health care provider.  2024 Elsevier/Gold Standard (2021-07-02 00:00:00)   BELOW ARE SYMPTOMS THAT SHOULD BE REPORTED IMMEDIATELY: *FEVER GREATER THAN 100.4 F (38 C) OR HIGHER *CHILLS OR SWEATING *NAUSEA AND VOMITING THAT IS NOT CONTROLLED WITH YOUR NAUSEA MEDICATION *UNUSUAL SHORTNESS OF BREATH *UNUSUAL BRUISING OR BLEEDING *URINARY PROBLEMS (pain or burning when urinating, or frequent urination) *BOWEL PROBLEMS (unusual diarrhea, constipation, pain near the anus) TENDERNESS IN MOUTH AND THROAT WITH OR WITHOUT PRESENCE OF ULCERS (sore throat, sores in mouth, or a toothache) UNUSUAL RASH, SWELLING OR PAIN  UNUSUAL VAGINAL DISCHARGE OR ITCHING   Items with * indicate a potential emergency and should be followed up as soon as possible or go to the Emergency Department if any problems should occur.  Please show the CHEMOTHERAPY ALERT CARD or IMMUNOTHERAPY ALERT CARD at check-in to the Emergency Department and triage nurse.  Should you have questions after your visit or need to cancel or reschedule your appointment, please contact [facility]  [phone] and follow the prompts.  Office hours are 8:00 a.m. to 4:30 p.m. Monday  - Friday. Please note that voicemails left after 4:00 p.m. may not be returned until the following business day.  We are closed weekends and major holidays. You have access to a nurse at all times for urgent questions. Please call the main number to the clinic [phone] and follow the prompts.  For any non-urgent questions, you may also contact your provider using MyChart. We now offer e-Visits for anyone 50 and older to request care online for non-urgent symptoms. For details visit [email or web address].   Also download the MyChart app! Go to the app store, search "MyChart", open the app, select [place], and log in with your MyChart username and password.

## 2022-09-25 ENCOUNTER — Encounter: Payer: Self-pay | Admitting: Internal Medicine

## 2022-09-30 ENCOUNTER — Inpatient Hospital Stay: Payer: Medicare Other

## 2022-09-30 ENCOUNTER — Encounter: Payer: Self-pay | Admitting: Internal Medicine

## 2022-09-30 ENCOUNTER — Inpatient Hospital Stay: Payer: Medicare Other | Admitting: Internal Medicine

## 2022-09-30 VITALS — BP 148/80 | HR 92 | Temp 97.7°F | Ht 65.0 in | Wt 179.5 lb

## 2022-09-30 DIAGNOSIS — G620 Drug-induced polyneuropathy: Secondary | ICD-10-CM | POA: Diagnosis not present

## 2022-09-30 DIAGNOSIS — Z79899 Other long term (current) drug therapy: Secondary | ICD-10-CM | POA: Diagnosis not present

## 2022-09-30 DIAGNOSIS — C3412 Malignant neoplasm of upper lobe, left bronchus or lung: Secondary | ICD-10-CM

## 2022-09-30 DIAGNOSIS — L299 Pruritus, unspecified: Secondary | ICD-10-CM | POA: Diagnosis not present

## 2022-09-30 DIAGNOSIS — R5383 Other fatigue: Secondary | ICD-10-CM | POA: Diagnosis not present

## 2022-09-30 DIAGNOSIS — Z87891 Personal history of nicotine dependence: Secondary | ICD-10-CM | POA: Diagnosis not present

## 2022-09-30 DIAGNOSIS — Z5112 Encounter for antineoplastic immunotherapy: Secondary | ICD-10-CM | POA: Diagnosis not present

## 2022-09-30 DIAGNOSIS — T451X5A Adverse effect of antineoplastic and immunosuppressive drugs, initial encounter: Secondary | ICD-10-CM | POA: Diagnosis not present

## 2022-09-30 DIAGNOSIS — Z79620 Long term (current) use of immunosuppressive biologic: Secondary | ICD-10-CM | POA: Diagnosis not present

## 2022-09-30 LAB — CBC WITH DIFFERENTIAL (CANCER CENTER ONLY)
Abs Immature Granulocytes: 0.02 10*3/uL (ref 0.00–0.07)
Basophils Absolute: 0 10*3/uL (ref 0.0–0.1)
Basophils Relative: 0 %
Eosinophils Absolute: 0.2 10*3/uL (ref 0.0–0.5)
Eosinophils Relative: 2 %
HCT: 33 % — ABNORMAL LOW (ref 36.0–46.0)
Hemoglobin: 10.9 g/dL — ABNORMAL LOW (ref 12.0–15.0)
Immature Granulocytes: 0 %
Lymphocytes Relative: 26 %
Lymphs Abs: 1.6 10*3/uL (ref 0.7–4.0)
MCH: 28.1 pg (ref 26.0–34.0)
MCHC: 33 g/dL (ref 30.0–36.0)
MCV: 85.1 fL (ref 80.0–100.0)
Monocytes Absolute: 0.5 10*3/uL (ref 0.1–1.0)
Monocytes Relative: 8 %
Neutro Abs: 3.9 10*3/uL (ref 1.7–7.7)
Neutrophils Relative %: 64 %
Platelet Count: 260 10*3/uL (ref 150–400)
RBC: 3.88 MIL/uL (ref 3.87–5.11)
RDW: 13.9 % (ref 11.5–15.5)
WBC Count: 6.2 10*3/uL (ref 4.0–10.5)
nRBC: 0 % (ref 0.0–0.2)

## 2022-09-30 LAB — CMP (CANCER CENTER ONLY)
ALT: 10 U/L (ref 0–44)
AST: 14 U/L — ABNORMAL LOW (ref 15–41)
Albumin: 3.2 g/dL — ABNORMAL LOW (ref 3.5–5.0)
Alkaline Phosphatase: 92 U/L (ref 38–126)
Anion gap: 8 (ref 5–15)
BUN: 11 mg/dL (ref 8–23)
CO2: 24 mmol/L (ref 22–32)
Calcium: 9.2 mg/dL (ref 8.9–10.3)
Chloride: 103 mmol/L (ref 98–111)
Creatinine: 0.64 mg/dL (ref 0.44–1.00)
GFR, Estimated: >60 mL/min (ref >60)
Glucose, Bld: 134 mg/dL — ABNORMAL HIGH (ref 70–99)
Potassium: 4 mmol/L (ref 3.5–5.1)
Sodium: 135 mmol/L (ref 135–145)
Total Bilirubin: 0.5 mg/dL (ref 0.3–1.2)
Total Protein: 6.7 g/dL (ref 6.5–8.1)

## 2022-09-30 LAB — TSH: TSH: 6.136 u[IU]/mL — ABNORMAL HIGH (ref 0.350–4.500)

## 2022-09-30 MED ORDER — SODIUM CHLORIDE 0.9 % IV SOLN
Freq: Once | INTRAVENOUS | Status: AC
  Filled 2022-09-30: qty 250

## 2022-09-30 MED ORDER — HEPARIN SOD (PORK) LOCK FLUSH 100 UNIT/ML IV SOLN
INTRAVENOUS | Status: AC
  Filled 2022-09-30: qty 5

## 2022-09-30 MED ORDER — SODIUM CHLORIDE 0.9 % IV SOLN
200.0000 mg | Freq: Once | INTRAVENOUS | Status: AC
  Administered 2022-09-30: 200 mg via INTRAVENOUS
  Filled 2022-09-30: qty 200

## 2022-09-30 MED ORDER — HEPARIN SOD (PORK) LOCK FLUSH 100 UNIT/ML IV SOLN
500.0000 [IU] | Freq: Once | INTRAVENOUS | Status: AC | PRN
  Administered 2022-09-30: 500 [IU]
  Filled 2022-09-30: qty 5

## 2022-09-30 NOTE — Patient Instructions (Signed)
[facility]  Discharge Instructions: Thank you for choosing [facility] to provide your oncology and hematology care.  If you have a lab appointment with the Cancer Center, please go directly to the Cancer Center and check in at the registration area.  Wear comfortable clothing and clothing appropriate for easy access to any Portacath or PICC line.   We strive to give you quality time with your provider. You may need to reschedule your appointment if you arrive late (15 or more minutes).  Arriving late affects you and other patients whose appointments are after yours.  Also, if you miss three or more appointments without notifying the office, you may be dismissed from the clinic at the provider's discretion.      For prescription refill requests, have your pharmacy contact our office and allow 72 hours for refills to be completed.    Today you received the following chemotherapy and/or immunotherapy agents- keytruda      To help prevent nausea and vomiting after your treatment, we encourage you to take your nausea medication as directed.  BELOW ARE SYMPTOMS THAT SHOULD BE REPORTED IMMEDIATELY: *FEVER GREATER THAN 100.4 F (38 C) OR HIGHER *CHILLS OR SWEATING *NAUSEA AND VOMITING THAT IS NOT CONTROLLED WITH YOUR NAUSEA MEDICATION *UNUSUAL SHORTNESS OF BREATH *UNUSUAL BRUISING OR BLEEDING *URINARY PROBLEMS (pain or burning when urinating, or frequent urination) *BOWEL PROBLEMS (unusual diarrhea, constipation, pain near the anus) TENDERNESS IN MOUTH AND THROAT WITH OR WITHOUT PRESENCE OF ULCERS (sore throat, sores in mouth, or a toothache) UNUSUAL RASH, SWELLING OR PAIN  UNUSUAL VAGINAL DISCHARGE OR ITCHING   Items with * indicate a potential emergency and should be followed up as soon as possible or go to the Emergency Department if any problems should occur.  Please show the CHEMOTHERAPY ALERT CARD or IMMUNOTHERAPY ALERT CARD at check-in to  the Emergency Department and triage nurse.  Should you have questions after your visit or need to cancel or reschedule your appointment, please contact [facility]  [phone] and follow the prompts.  Office hours are 8:00 a.m. to 4:30 p.m. Monday - Friday. Please note that voicemails left after 4:00 p.m. may not be returned until the following business day.  We are closed weekends and major holidays. You have access to a nurse at all times for urgent questions. Please call the main number to the clinic [phone] and follow the prompts.  For any non-urgent questions, you may also contact your provider using MyChart. We now offer e-Visits for anyone 18 and older to request care online for non-urgent symptoms. For details visit [email or web address].   Also download the MyChart app! Go to the app store, search "MyChart", open the app, select , and log in with your MyChart username and password.    

## 2022-09-30 NOTE — Assessment & Plan Note (Signed)
#   Left upper lobe-lung cancer moderately differentiated squamous cell carcinoma-pT1pN1- [IIB]; PDL-1 =70%. EGFR status-WILD.  Adjuvant chemotherapy-cisplatin Taxotere x 4 cycles; followed by adjuvant Pembrolizumab x1 year.  s/p 4 cycles of Cis-Taxotere-CT scan March 2024 maturing posttreatment changes from left sided left upper lobe resection with volume loss. Small left effusion. No new developing mass lesion, lymph node enlargement. Stable.   # proceed with adjuvant therapy-Keytruda cycle #7/17. Labs today reviewed;  acceptable for treatment today. MAY 2024- Thyroid- WNL.   # Skin Itch with out rash- ?- sec to Keytruda- G-2- on benadryl BID; add Atarax 10 qhs- stable.  # PN G-1 ; from Taxotere- stable.  # # IV Access: port-functional.  pt requesting AM appts-   # DISPOSITION: # keytruda today # follow up in 3 weeks- MD; labs- cbc/cmp;TSH; Keytruda- Dr.B

## 2022-09-30 NOTE — Progress Notes (Signed)
[facility] CONSULT NOTE  Patient Care Team: Tower, Audrie Gallus, MD as PCP - General Glory Buff, RN as Oncology Nurse Navigator Earna Coder, MD as Consulting Physician (Internal Medicine)  CHIEF COMPLAINTS/PURPOSE OF CONSULTATION: lung cancer  #  Oncology History Overview Note  JULY 2023- 1. 10 mm mean diameter nodule in the left upper lobe adjacent to an area of pleural and parenchymal scarring.     SEP 2023- 1. Hypermetabolic spiculated nodular left upper lobe pulmonary lesion is compatible with primary bronchogenic neoplasm. 2. Mildly metabolic nodularity in the left hilum, without discrete adenopathy may reflect early nodal disease involvement. Suggest attention on follow-up imaging. 3. No evidence of distant hypermetabolic metastatic disease. 4. Hypermetabolic nodule in the left lobe of the thyroid, Recommend thyroid US and biopsy (ref: J Am Coll Radiol. 2015 Feb;12(2): 143-50). 5. Aortic Atherosclerosis (ICD10-I70.0) and Emphysema (ICD10-J43.9).  # LUNG: Resection  Synchronous Tumors: Not applicable Total Number of Primary Tumors: 1 Procedure: Lobectomy, lung Specimen Laterality: Left Tumor Focality: Unifocal Tumor Site: Upper lobe Tumor Size: 2.6 cm Histologic Type: Squamous cell carcinoma, moderately differentiated Visceral Pleura Invasion: Not identified Direct Invasion of Adjacent Structures: No adjacent structures present Lymphovascular Invasion: Not identified Margins: All margins negative for invasive carcinoma      Closest Margin(s) to Invasive Carcinoma: Bronchovascular margin      Margin(s) Involved by Invasive Carcinoma: Not applicable       Margin Status for Non-Invasive Tumor: Negative Treatment Effect: No known presurgical therapy Regional Lymph Nodes:      Number of Lymph Nodes Involved: 1                           Nodal Sites with Tumor: Level 10      Number of Lymph Nodes Examined: 21                      Nodal Sites  Examined: Levels 4, 5, 7, 9, 10, 11, 12 and 13 Distant Metastasis:      Distant Site(s) Involved: Not applicable Pathologic Stage Classification (pTNM, AJCC 8th Edition): pT1c, pN1 Ancillary Studies: Can be performed upon request Representative Tumor Block: B5 Comment(s): None  # DEC 14th, 2023- Cisplatin-Taxotere #1; D-2 Greggory Keen x4 cycles [finished FEB 2024]  # April 15th, 2024- Keytruda q 3 W x1 year    Lung cancer (HCC)  12/05/2021 Initial Diagnosis   Lung cancer (HCC)   12/18/2021 Cancer Staging   Staging form: Lung, AJCC 8th Edition - Pathologic stage from 12/18/2021: Stage IIB (pT1c, pN1, cM0) - Signed by Loreli Slot, MD on 12/18/2021 Histopathologic type: Squamous cell carcinoma, NOS   Cancer of upper lobe of left lung (HCC)  01/03/2022 Initial Diagnosis   Cancer of upper lobe of left lung (HCC)   01/03/2022 Cancer Staging   Staging form: Lung, AJCC 8th Edition - Pathologic: Stage IIB (pT1c, pN1, cM0) - Signed by Earna Coder, MD on 01/03/2022   02/13/2022 - 04/18/2022 Chemotherapy   Patient is on Treatment Plan : LUNG Cisplatin + Docetaxel q21d     06/16/2022 -  Chemotherapy   Patient is on Treatment Plan : LUNG NSCLC Pembrolizumab (200) q21d      HISTORY OF PRESENTING ILLNESS: Ambulating independently.  Alone.   [name] [name] 72 y.o.  female history of smoking with stage II T2 N1 squamous cell carcinoma left lung cancer currently s/p adjuvant chemotherapy -is here to proceed with  adjuvant  immunotherapy/keytruda.   Patients fingers tingle- improved.  No rash.   Denies any worsening dyspnea or cough. Otherwise feels good. Only small complaint would be fatigue. Denies pain. Appetite is good. Bowels normal.  Denies any worsening pain.   Review of Systems  Constitutional:  Negative for chills, diaphoresis, fever, malaise/fatigue and weight loss.  HENT:  Negative for nosebleeds and sore throat.   Eyes:  Negative for double vision.  Respiratory:   Negative for cough, hemoptysis, sputum production, shortness of breath and wheezing.   Cardiovascular:  Negative for chest pain, palpitations, orthopnea and leg swelling.  Gastrointestinal:  Negative for abdominal pain, blood in stool, constipation, diarrhea, heartburn, melena, nausea and vomiting.  Genitourinary:  Negative for dysuria, frequency and urgency.  Musculoskeletal:  Negative for back pain and joint pain.  Skin: Negative.  Negative for itching and rash.  Neurological:  Positive for tingling. Negative for dizziness, focal weakness, weakness and headaches.  Endo/Heme/Allergies:  Does not bruise/bleed easily.  Psychiatric/Behavioral:  Negative for depression. The patient is not nervous/anxious and does not have insomnia.      MEDICAL HISTORY:  Past Medical History:  Diagnosis Date   Arthritis    OA   Carpal tunnel syndrome    Depression    Endometriosis    Fibromyalgia    Hyperlipidemia    Left upper lobe pulmonary nodule 11/2021   Post herpetic neuralgia    Sleep disorder    Tobacco abuse     SURGICAL HISTORY: Past Surgical History:  Procedure Laterality Date   ABDOMINAL HYSTERECTOMY  1992   total endometriosis   APPENDECTOMY  1992   bladder tack  1992   CATARACT EXTRACTION W/PHACO Right 03/25/2018   Procedure: CATARACT EXTRACTION PHACO AND INTRAOCULAR LENS PLACEMENT (IOC) RIGHT;  Surgeon: Elliot Cousin, MD;  Location: [facility] ORS;  Service: Ophthalmology;  Laterality: Right;  [place]  01:11 CDE 8.02 Fluid pack lot # [id number]   CATARACT EXTRACTION W/PHACO Left 04/22/2018   Procedure: CATARACT EXTRACTION PHACO AND INTRAOCULAR LENS PLACEMENT (IOC) Left Eye;  Surgeon: Elliot Cousin, MD;  Location: [facility] ORS;  Service: Ophthalmology;  Laterality: Left;  [place]  01:02 CDE 8.11 Fluid pack lot # [id number]   COLONOSCOPY     FOOT SURGERY Right 04/2016   [facility] in [place] great toe; straightening others   INTERCOSTAL NERVE BLOCK Left 12/12/2021   Procedure: INTERCOSTAL  NERVE BLOCK;  Surgeon: Loreli Slot, MD;  Location: [facility] OR;  Service: Thoracic;  Laterality: Left;   IR IMAGING GUIDED PORT INSERTION  02/27/2022   LYMPH NODE DISSECTION Left 12/12/2021   Procedure: LYMPH NODE DISSECTION;  Surgeon: Loreli Slot, MD;  Location: [facility] OR;  Service: Thoracic;  Laterality: Left;   VIDEO BRONCHOSCOPY N/A 12/16/2021   Procedure: VIDEO BRONCHOSCOPY;  Surgeon: Loreli Slot, MD;  Location: [facility] OR;  Service: Thoracic;  Laterality: N/A;    SOCIAL HISTORY: Social History   Socioeconomic History   Marital status: Widowed    Spouse name: Not on file   Number of children: 2   Years of education: Not on file   Highest education level: Not on file  Occupational History   Not on file  Tobacco Use   Smoking status: Former    Current packs/day: 0.00    Average packs/day: 1 pack/day for 55.0 years (55.0 ttl pk-yrs)    Types: Cigarettes    Start date: 12/04/1966    Quit date: 12/03/2021    Years since quitting: 0.8  Smokeless tobacco: Never  Vaping Use   Vaping status: Some Days  Substance and Sexual Activity   Alcohol use: No    Alcohol/week: 0.0 standard drinks of alcohol   Drug use: No   Sexual activity: Not Currently  Other Topics Concern   Not on file  Social History Narrative   Lives alone   Social Determinants of Health   Financial Resource Strain: Low Risk  (04/16/2022)   Overall Financial Resource Strain (CARDIA)    Difficulty of Paying Living Expenses: Not hard at all  Food Insecurity: No Food Insecurity (04/16/2022)   Hunger Vital Sign    Worried About Running Out of Food in the Last Year: Never true    Ran Out of Food in the Last Year: Never true  Transportation Needs: No Transportation Needs (04/16/2022)   PRAPARE - Administrator, Civil Service (Medical): No    Lack of Transportation (Non-Medical): No  Physical Activity: Inactive (04/16/2022)   Exercise Vital Sign    Days of Exercise per Week: 0 days     Minutes of Exercise per Session: 0 min  Stress: No Stress Concern Present (04/16/2022)   [employer] of Occupational Health - Occupational Stress Questionnaire    Feeling of Stress : Not at all  Social Connections: Moderately Integrated (04/16/2022)   Social Connection and Isolation Panel [NHANES]    Frequency of Communication with Friends and Family: More than three times a week    Frequency of Social Gatherings with Friends and Family: More than three times a week    Attends Religious Services: More than 4 times per year    Active Member of [employer] or Organizations: Yes    Attends Banker Meetings: More than 4 times per year    Marital Status: Widowed  Intimate Partner Violence: Not At Risk (04/16/2022)   Humiliation, Afraid, Rape, and Kick questionnaire    Fear of Current or Ex-Partner: No    Emotionally Abused: No    Physically Abused: No    Sexually Abused: No    FAMILY HISTORY: Family History  Problem Relation Age of Onset   Heart disease Mother        MI   Heart disease Father        MI   Arthritis Sister        crippling arthritis   Arthritis Brother        crippling arthritis    ALLERGIES:  has No Known Allergies.  MEDICATIONS:  Current Outpatient Medications  Medication Sig Dispense Refill   acetaminophen (TYLENOL) 500 MG tablet Take 500 mg by mouth every 6 (six) hours as needed.     aspirin 81 MG tablet Take 81 mg by mouth daily.     busPIRone (BUSPAR) 15 MG tablet Take 0.5 tablets (7.5 mg total) by mouth 2 (two) times daily. 90 tablet 2   Cobalamin Combinations (NEURIVA PLUS PO) Take by mouth.     dexamethasone (DECADRON) 4 MG tablet Take 1 tablet (4 mg total) by mouth 2 (two) times daily with a meal. Start 2 days prior to chemo. Take for 2 days; DO NOT take on the day of chemo. 60 tablet 0   docusate sodium (COLACE) 100 MG capsule Take 200 mg by mouth daily.     DULoxetine (CYMBALTA) 60 MG capsule TAKE 1 CAPSULE BY MOUTH EVERY DAY 90 capsule 1    hydrOXYzine (ATARAX) 10 MG tablet Take 1 tablet (10 mg total) by mouth 2 (two) times  daily as needed for itching (May cause drowsiness). 90 tablet 1   lidocaine-prilocaine (EMLA) cream Apply on the port. 30 -45 min  prior to port access. 30 g 3   MAGNESIUM CITRATE PO Take by mouth daily.     Melatonin 10 MG CAPS Take 20 mg by mouth at bedtime.     naproxen sodium (ALEVE) 220 MG tablet Take 440 mg by mouth daily as needed (headaches).     nicotine (NICODERM CQ - DOSED IN MG/24 HR) 7 mg/24hr patch Place 1 patch (7 mg total) onto the skin daily. 28 patch 2   ondansetron (ZOFRAN) 8 MG tablet One pill every 8 hours as needed for nausea/vomitting. 40 tablet 1   pregabalin (LYRICA) 75 MG capsule TAKE 1 CAPSULE BY MOUTH TWICE A DAY 180 capsule 1   prochlorperazine (COMPAZINE) 10 MG tablet Take 1 tablet (10 mg total) by mouth every 6 (six) hours as needed for nausea or vomiting. 40 tablet 1   senna (SENOKOT) 8.6 MG tablet Take 1 tablet (8.6 mg total) by mouth 2 (two) times daily as needed for constipation. 14 tablet 0   Tiotropium Bromide-Olodaterol (STIOLTO RESPIMAT) 2.5-2.5 MCG/ACT AERS Inhale 2 puffs into the lungs daily. 4 g 0   traMADol (ULTRAM) 50 MG tablet Take 1 tablet (50 mg total) by mouth every 8 (eight) hours as needed. 90 tablet 0   No current facility-administered medications for this visit.   Facility-Administered Medications Ordered in Other Visits  Medication Dose Route Frequency Provider Last Rate Last Admin   0.9 %  sodium chloride infusion   Intravenous Continuous Rushie Chestnut, PA-C   Stopped at 02/17/22 1214   heparin lock flush 100 UNIT/ML injection            sodium chloride flush (NS) 0.9 % injection 10 mL  10 mL Intravenous Once Alinda Dooms, NP          .  PHYSICAL EXAMINATION: ECOG PERFORMANCE STATUS: 0 - Asymptomatic  Vitals:   09/30/22 0854  BP: (!) 148/80  Pulse: 92  Temp: 97.7 F (36.5 C)  SpO2: 97%    Filed Weights   09/30/22 0854  Weight: 179 lb  8 oz (81.4 kg)     Physical Exam Vitals and nursing note reviewed.  Constitutional:      Comments:     HENT:     Head: Normocephalic and atraumatic.     Mouth/Throat:     Mouth: Mucous membranes are moist.     Pharynx: No oropharyngeal exudate.  Eyes:     Pupils: Pupils are equal, round, and reactive to light.  Cardiovascular:     Rate and Rhythm: Normal rate and regular rhythm.  Pulmonary:     Effort: No respiratory distress.     Breath sounds: No wheezing.     Comments: Decreased breath sounds bilaterally at bases.  No wheeze or crackles Abdominal:     General: Bowel sounds are normal. There is no distension.     Palpations: Abdomen is soft. There is no mass.     Tenderness: There is no abdominal tenderness. There is no guarding or rebound.  Musculoskeletal:        General: No tenderness. Normal range of motion.     Cervical back: Normal range of motion and neck supple.  Skin:    General: Skin is warm.  Neurological:     Mental Status: She is alert and oriented to person, place, and time.  Psychiatric:  Mood and Affect: Affect normal.        Judgment: Judgment normal.      LABORATORY DATA:  I have reviewed the data as listed Lab Results  Component Value Date   WBC 6.2 09/30/2022   HGB 10.9 (L) 09/30/2022   HCT 33.0 (L) 09/30/2022   MCV 85.1 09/30/2022   PLT 260 09/30/2022   Recent Labs    08/19/22 0909 09/09/22 1019 09/30/22 0903  NA 136 134* 135  K 4.1 4.5 4.0  CL 104 101 103  CO2 24 26 24   GLUCOSE 101* 101* 134*  BUN 12 23 11   CREATININE 0.81 0.86 0.64  CALCIUM 9.1 9.4 9.2  GFRNONAA >60 >60 >60  PROT 6.7 7.2 6.7  ALBUMIN 3.6 3.9 3.2*  AST 19 19 14*  ALT 13 14 10   ALKPHOS 105 128* 92  BILITOT 0.6 0.3 0.5    RADIOGRAPHIC STUDIES: I have personally reviewed the radiological images as listed and agreed with the findings in the report. No results found.  ASSESSMENT & PLAN:   Cancer of upper lobe of left lung (HCC) # Left upper  lobe-lung cancer moderately differentiated squamous cell carcinoma-pT1pN1- [IIB]; PDL-1 =70%. EGFR status-WILD.  Adjuvant chemotherapy-cisplatin Taxotere x 4 cycles; followed by adjuvant Pembrolizumab x1 year.  s/p 4 cycles of Cis-Taxotere-CT scan March 2024 maturing posttreatment changes from left sided left upper lobe resection with volume loss. Small left effusion. No new developing mass lesion, lymph node enlargement. Stable.   # proceed with adjuvant therapy-Keytruda cycle #7/17. Labs today reviewed;  acceptable for treatment today. MAY 2024- Thyroid- WNL.   # Skin Itch with out rash- ?- sec to Keytruda- G-2- on benadryl BID; add Atarax 10 qhs- stable.  # PN G-1 ; from Taxotere- stable.  # # IV Access: port-functional.  pt requesting AM appts-   # DISPOSITION: # keytruda today # follow up in 3 weeks- MD; labs- cbc/cmp;TSH; Rande Lawman- Dr.B  All questions were answered. The patient knows to call the clinic with any problems, questions or concerns.    Earna Coder, MD 09/30/2022 9:45 AM

## 2022-09-30 NOTE — Progress Notes (Signed)
No concerns today 

## 2022-10-01 ENCOUNTER — Telehealth: Payer: Self-pay

## 2022-10-01 NOTE — Telephone Encounter (Signed)
Sending to lsc support. 

## 2022-10-02 ENCOUNTER — Telehealth (INDEPENDENT_AMBULATORY_CARE_PROVIDER_SITE_OTHER): Payer: Medicare Other | Admitting: Primary Care

## 2022-10-02 DIAGNOSIS — Z720 Tobacco use: Secondary | ICD-10-CM | POA: Diagnosis not present

## 2022-10-02 DIAGNOSIS — J449 Chronic obstructive pulmonary disease, unspecified: Secondary | ICD-10-CM

## 2022-10-02 NOTE — Patient Instructions (Signed)
No changes Congratulations on quitting smoking Follow-up in 3 month follow-up

## 2022-10-02 NOTE — Progress Notes (Signed)
  Virtual Visit via Telephone Note  I connected with [name] [name] on 10/02/22 at 11:00 AM EDT by telephone and verified that I am speaking with the correct person using two identifiers.  Location: Patient: Home Provider: Office    I discussed the limitations, risks, security and privacy concerns of performing an evaluation and management service by telephone and the availability of in person appointments. I also discussed with the patient that there may be a patient responsible charge related to this service. The patient expressed understanding and agreed to proceed.   History of Present Illness: 72 year old female, former smoker quit 09/01/22 (55 pack year hx). PMH significant for stage 1 mild COPD, lung cancer status post lobectomy, thoracic aortic aneurysm, osteoarthritis, hyperlipidemia, fibromyalgia.  Previous [facility] pulmonary encounter: 09/01/2022 Patient presents today for 1 month follow-up. Accompanied by her daughter. She is doing well today. Breathing is some better since changing from Trelegy to [employer]. She has been using incentive spirometer before going to sleep at night. She only experiences dyspnea with heavy exertion. No active cough or wheezing symptoms. She admits to smoking about 2-3 virginia slim cigarettes a day. She has quit vaping. Pulmonary function testing in June showed minimal obstructive airway disease with moderate diffusion defect.   10/02/2022- interim hx  Patient contacted for virtual visit today/ 1 month follow-up COPD and tobacco abuse. She was unable to connect to video/spoke with her by phone. She is doing well. No acute respiratory complaints. She only gets short winded when outside in the heat. She recovered easily with rest/indoors. She is compliant with Stiolto Respimat 2 puffs daily. She hasn't had to use Albuterol recently. She has not smoked since our last visit. She is using nicotine patches.    Observations/Objective:  Unable to connect to video visit   Able to speak in full sentences without overt shortness of breath/wheezing    Pulmonary function testing: 11/25/21 PFT>> 2.44 (83%), FVC 2.0 (90%), ratio 82, DLCO 14.23 (73%)   08/07/22 PFT>> FVC 2.20 (76%), FEV1 1.62 (74%), ratio 74, DLCO 12.17 (63%)   Assessment and Plan:  COPD: - Doing well; No acute respiratory complaints. No SABA use.  - Continue Stiolto respimat two puffs daily   Tobacco abuse:  - Her official quit day was 09/01/22, she is no longer smoking  - She is using nicotine patches    Cancer of upper lobe of left lung [facility]) - S/p LUL lobectomy in October 2023; Completed chemotherapy February 2024  - Currently undergoing immunotherapy with Keytruda, advised patient notify oncologist about new fatigue, weight gain and dyspnea symptoms since being on medication   Follow Up Instructions:  - 3 Months with Dr. Jayme Cloud or Waynetta Sandy NP    I discussed the assessment and treatment plan with the patient. The patient was provided an opportunity to ask questions and all were answered. The patient agreed with the plan and demonstrated an understanding of the instructions.   The patient was advised to call back or seek an in-person evaluation if the symptoms worsen or if the condition fails to improve as anticipated.  I provided 22 minutes of non-face-to-face time during this encounter.   Glenford Bayley, NP

## 2022-10-02 NOTE — Progress Notes (Signed)
Agree with the details of the visit as noted by Elizabeth Walsh, NP.  C. Laura Gonzalez, MD [facility] 

## 2022-10-03 ENCOUNTER — Ambulatory Visit (INDEPENDENT_AMBULATORY_CARE_PROVIDER_SITE_OTHER)
Admission: RE | Admit: 2022-10-03 | Discharge: 2022-10-03 | Disposition: A | Discharge status: Home / Self Care | Payer: Medicare Other | Source: Ambulatory Visit | Attending: Family Medicine | Admitting: Family Medicine

## 2022-10-03 ENCOUNTER — Ambulatory Visit (INDEPENDENT_AMBULATORY_CARE_PROVIDER_SITE_OTHER): Payer: Medicare Other | Admitting: Family Medicine

## 2022-10-03 ENCOUNTER — Encounter: Payer: Self-pay | Admitting: Family Medicine

## 2022-10-03 VITALS — BP 110/60 | HR 79 | Temp 97.6°F | Ht 65.0 in | Wt 178.5 lb

## 2022-10-03 DIAGNOSIS — M797 Fibromyalgia: Secondary | ICD-10-CM

## 2022-10-03 DIAGNOSIS — S8991XA Unspecified injury of right lower leg, initial encounter: Secondary | ICD-10-CM | POA: Insufficient documentation

## 2022-10-03 DIAGNOSIS — M1711 Unilateral primary osteoarthritis, right knee: Secondary | ICD-10-CM | POA: Diagnosis not present

## 2022-10-03 DIAGNOSIS — M25561 Pain in right knee: Secondary | ICD-10-CM | POA: Diagnosis not present

## 2022-10-03 NOTE — Progress Notes (Signed)
Subjective:    Patient ID: [name] [name], female    DOB: June 15, 1950, 72 y.o.   MRN: [id number]  HPI  Wt Readings from Last 3 Encounters:  10/03/22 178 lb 8 oz (81 kg)  09/30/22 179 lb 8 oz (81.4 kg)  09/09/22 181 lb (82.1 kg)   29.70 kg/m  Vitals:   10/03/22 1028  BP: 110/60  Pulse: 79  Temp: 97.6 F (36.4 C)  SpO2: 96%    Pt presents with knee injury after a fall Also follow up of fibromyalgia   Tried to step over [employer] - happened about 3 weeks ago  She caught foot and fell  Did landed on the right knee , unsure if twisted Did swell immediately and bruised   Not getting any better   Worse to flex  Can bear weight w/o help= slowly   Pain is all over  Worse in the back  Patella is not very tender   Stiff after inactivity     In setting of left sided lung cancer  Chemo cisplatin and taxotere  Left upper lobe resection  Keytruda   Myofascial pain disorder  Does not feel like it is working  In setting of cancer treatments   Hurts all over  [employer] affects her   Lyrica 75 mg bid  Cymbalta 60 mg daily  Tramadol prn  Hydroxyzine  Aleve   Asper creame with lidocaine  Tiger balm  Ice  Elastic band   Knee film today  DG Knee Complete 4 Views Right  Result Date: 10/03/2022 CLINICAL DATA:  Right knee pain. Fall 3 weeks ago. Patella is tender. EXAM: RIGHT KNEE - COMPLETE 4+ VIEW COMPARISON:  09/16/2016 FINDINGS: Right knee is located without a fracture or large joint effusion. There may be mild chondrocalcinosis in the knee joint. Normal alignment. Spurring and degenerative changes at the patellofemoral compartment of the knee. IMPRESSION: 1. No acute bone abnormality to the right knee. 2. Degenerative changes at the patellofemoral compartment of the knee. 3. Possible chondrocalcinosis. Electronically Signed   By: Richarda Overlie M.D.   On: 10/03/2022 12:09      Patient Active Problem List   Diagnosis Date Noted   Right knee injury  10/03/2022   Shortness of breath 08/04/2022   Dysuria 07/16/2022   Thoracic aortic aneurysm (HCC) 06/11/2022   Stage 1 mild COPD by GOLD classification (HCC) 06/03/2022   Cancer of upper lobe of left lung (HCC) 01/03/2022   S/P lobectomy of lung 12/12/2021   Lung cancer (HCC) 12/05/2021   Acute pain of right shoulder 08/15/2021   Elevated glucose level 01/02/2019   Elevated TSH 11/30/2018   Grief reaction 11/24/2017   Multiple joint pain 09/16/2016   Osteopenia 06/15/2016   Screening mammogram, encounter for 04/28/2016   Estrogen deficiency 04/28/2016   Medicare annual wellness visit, initial 01/02/2015   Colon cancer screening 05/31/2013   Hyperkalemia 11/30/2012   Retinopathy, bilateral 10/05/2012   Hyperlipidemia, mild 10/05/2012   Adverse effects of medication 03/24/2012   Routine general medical examination at a health care facility 01/22/2011   Generalized osteoarthritis of hand 07/06/2009   SEBACEOUS CYST, NECK 07/13/2007   SLEEP DISORDER 01/18/2007   POSTHERPETIC NEURALGIA 12/15/2006   Tobacco abuse 12/15/2006   CARPAL TUNNEL SYNDROME 12/15/2006   Osteoarthritis 12/15/2006   Fibromyalgia 12/15/2006   Past Medical History:  Diagnosis Date   Arthritis    OA   Carpal tunnel syndrome    Depression    Endometriosis  Fibromyalgia    Hyperlipidemia    Left upper lobe pulmonary nodule 11/2021   Post herpetic neuralgia    Sleep disorder    Tobacco abuse    Past Surgical History:  Procedure Laterality Date   ABDOMINAL HYSTERECTOMY  1992   total endometriosis   APPENDECTOMY  1992   bladder tack  1992   CATARACT EXTRACTION W/PHACO Right 03/25/2018   Procedure: CATARACT EXTRACTION PHACO AND INTRAOCULAR LENS PLACEMENT (IOC) RIGHT;  Surgeon: Elliot Cousin, MD;  Location: [facility] ORS;  Service: Ophthalmology;  Laterality: Right;  [place]  01:11 CDE 8.02 Fluid pack lot # [id number]   CATARACT EXTRACTION W/PHACO Left 04/22/2018   Procedure: CATARACT EXTRACTION PHACO AND  INTRAOCULAR LENS PLACEMENT (IOC) Left Eye;  Surgeon: Elliot Cousin, MD;  Location: [facility] ORS;  Service: Ophthalmology;  Laterality: Left;  [place]  01:02 CDE 8.11 Fluid pack lot # [id number]   COLONOSCOPY     FOOT SURGERY Right 04/2016   [facility] in [place] great toe; straightening others   INTERCOSTAL NERVE BLOCK Left 12/12/2021   Procedure: INTERCOSTAL NERVE BLOCK;  Surgeon: Loreli Slot, MD;  Location: [facility] OR;  Service: Thoracic;  Laterality: Left;   IR IMAGING GUIDED PORT INSERTION  02/27/2022   LYMPH NODE DISSECTION Left 12/12/2021   Procedure: LYMPH NODE DISSECTION;  Surgeon: Loreli Slot, MD;  Location: [facility] OR;  Service: Thoracic;  Laterality: Left;   VIDEO BRONCHOSCOPY N/A 12/16/2021   Procedure: VIDEO BRONCHOSCOPY;  Surgeon: Loreli Slot, MD;  Location: [facility] OR;  Service: Thoracic;  Laterality: N/A;   Social History   Tobacco Use   Smoking status: Former    Current packs/day: 0.00    Average packs/day: 1 pack/day for 55.0 years (55.0 ttl pk-yrs)    Types: Cigarettes    Start date: 12/04/1966    Quit date: 12/03/2021    Years since quitting: 0.8   Smokeless tobacco: Never  Vaping Use   Vaping status: Some Days  Substance Use Topics   Alcohol use: No    Alcohol/week: 0.0 standard drinks of alcohol   Drug use: No   Family History  Problem Relation Age of Onset   Heart disease Mother        MI   Heart disease Father        MI   Arthritis Sister        crippling arthritis   Arthritis Brother        crippling arthritis   No Known Allergies Current Outpatient Medications on File Prior to Visit  Medication Sig Dispense Refill   acetaminophen (TYLENOL) 500 MG tablet Take 500 mg by mouth every 6 (six) hours as needed.     aspirin 81 MG tablet Take 81 mg by mouth daily.     busPIRone (BUSPAR) 15 MG tablet Take 0.5 tablets (7.5 mg total) by mouth 2 (two) times daily. 90 tablet 2   Cobalamin Combinations (NEURIVA PLUS PO) Take by mouth.      dexamethasone (DECADRON) 4 MG tablet Take 1 tablet (4 mg total) by mouth 2 (two) times daily with a meal. Start 2 days prior to chemo. Take for 2 days; DO NOT take on the day of chemo. 60 tablet 0   docusate sodium (COLACE) 100 MG capsule Take 200 mg by mouth daily.     DULoxetine (CYMBALTA) 60 MG capsule TAKE 1 CAPSULE BY MOUTH EVERY DAY 90 capsule 1   hydrOXYzine (ATARAX) 10 MG tablet Take 1 tablet (10 mg total) by  mouth 2 (two) times daily as needed for itching (May cause drowsiness). 90 tablet 1   lidocaine-prilocaine (EMLA) cream Apply on the port. 30 -45 min  prior to port access. 30 g 3   MAGNESIUM CITRATE PO Take by mouth daily.     Melatonin 10 MG CAPS Take 20 mg by mouth at bedtime.     naproxen sodium (ALEVE) 220 MG tablet Take 440 mg by mouth daily as needed (headaches).     nicotine (NICODERM CQ - DOSED IN MG/24 HR) 7 mg/24hr patch Place 1 patch (7 mg total) onto the skin daily. 28 patch 2   ondansetron (ZOFRAN) 8 MG tablet One pill every 8 hours as needed for nausea/vomitting. 40 tablet 1   pregabalin (LYRICA) 75 MG capsule TAKE 1 CAPSULE BY MOUTH TWICE A DAY 180 capsule 1   prochlorperazine (COMPAZINE) 10 MG tablet Take 1 tablet (10 mg total) by mouth every 6 (six) hours as needed for nausea or vomiting. 40 tablet 1   senna (SENOKOT) 8.6 MG tablet Take 1 tablet (8.6 mg total) by mouth 2 (two) times daily as needed for constipation. 14 tablet 0   Tiotropium Bromide-Olodaterol (STIOLTO RESPIMAT) 2.5-2.5 MCG/ACT AERS Inhale 2 puffs into the lungs daily. 4 g 0   traMADol (ULTRAM) 50 MG tablet Take 1 tablet (50 mg total) by mouth every 8 (eight) hours as needed. 90 tablet 0   Current Facility-Administered Medications on File Prior to Visit  Medication Dose Route Frequency Provider Last Rate Last Admin   0.9 %  sodium chloride infusion   Intravenous Continuous Covington, Sarah M, PA-C   Stopped at 02/17/22 1214   heparin lock flush 100 UNIT/ML injection            heparin lock flush 100  UNIT/ML injection            sodium chloride flush (NS) 0.9 % injection 10 mL  10 mL Intravenous Once Alinda Dooms, NP        Review of Systems     Objective:   Physical Exam Constitutional:      General: She is not in acute distress.    Appearance: Normal appearance. She is not ill-appearing or diaphoretic.     Comments: Overweight   Musculoskeletal:     Comments: Knee right  Mild superficial swelling Some old ecchymosis  No warmth to the touch  No crepitus  ROM:  Flex about 120 deg with pain  Ext -normal  Mcmurray-gen soreness Bounce test  neg   Stability: Anterior drawer normal  Lachman exam normal   Tenderness  Patella Posterior  Mild in medial jt line   Gait - able to bear weight      Skin:    Coloration: Skin is not jaundiced or pale.     Findings: Bruising present. No erythema or rash.  Neurological:     Mental Status: She is alert.     Cranial Nerves: No cranial nerve deficit.     Sensory: No sensory deficit.     Coordination: Coordination normal.     Comments: Moves slow due to all over body pain   Psychiatric:        Mood and Affect: Mood normal.           Assessment & Plan:   Problem List Items Addressed This Visit       Other   Right knee injury - Primary    Fell on the knee 3 weeks ago/unsure if twisted  Some tenderness on patella/ med joint space and posterior  Swelling and bruising is improved Can bear weight   Xray today to r/o fracture  Has tramadol and aleve prn  Using topical asper cream with lidocaine  Compression band if helpful Encouraged more cold compresses 10 min at a time         Relevant Orders   DG Knee Complete 4 Views Right (Completed)   Fibromyalgia    She has been worse (pain all over) since dx and treatment for lung cancer Looked up side effects profile of Keytruda and this may be worsening symptoms  Reviewed last oncology notes and studies today   Instructed to check in with her oncologist about  this   Continues Lyrica 75 mg bid Cymblata 60 mg daily  Tramadol and aleve prn

## 2022-10-03 NOTE — Patient Instructions (Addendum)
[name] [name] may be causing some worse joint and body pain  Talk to your oncologist    Xray today  We will contact you with results   Elevate and ice (10 minutes at a time ) as much as you can   Continue current medicines

## 2022-10-03 NOTE — Assessment & Plan Note (Signed)
Fell on the knee 3 weeks ago/unsure if twisted Some tenderness on patella/ med joint space and posterior  Swelling and bruising is improved Can bear weight   Xray today to r/o fracture  Has tramadol and aleve prn  Using topical asper cream with lidocaine  Compression band if helpful Encouraged more cold compresses 10 min at a time

## 2022-10-03 NOTE — Assessment & Plan Note (Addendum)
She has been worse (pain all over) since dx and treatment for lung cancer Looked up side effects profile of Keytruda and this may be worsening symptoms  Reviewed last oncology notes and studies today   Instructed to check in with her oncologist about this   Continues Lyrica 75 mg bid Cymblata 60 mg daily  Tramadol and aleve prn

## 2022-10-06 ENCOUNTER — Telehealth: Payer: Self-pay

## 2022-10-06 NOTE — Telephone Encounter (Signed)
Patient daughter called stating she has been in a lot of pain. Patient is in pain from the neck down. Patient daughter states they believe that the [facility] and the medication for fibromyalgia is working against each other but they do not know. Patient daughter states nothing is working for her as far as pain medication. Patient is either always laying down or sitting around and daughter states this is not normal for her.

## 2022-10-06 NOTE — Telephone Encounter (Signed)
RN returned Call to daughter, Rosey Bath. Patient recently fell and had xrays of her knee. However, the pain continues to endorses joint pain in the neck. She feels that her fibromyalgia medications are not longer working. Per patient- after teach [place] treatment she has noticed increase in joint/boney pains.  Apt made for smc - Josh at 1030 am tomorrow 10/07/22

## 2022-10-07 ENCOUNTER — Inpatient Hospital Stay: Payer: Medicare Other | Attending: Internal Medicine | Admitting: Hospice and Palliative Medicine

## 2022-10-07 ENCOUNTER — Other Ambulatory Visit: Payer: Self-pay | Admitting: Family Medicine

## 2022-10-07 VITALS — BP 108/75 | HR 75 | Temp 97.7°F | Resp 20

## 2022-10-07 DIAGNOSIS — M797 Fibromyalgia: Secondary | ICD-10-CM | POA: Diagnosis not present

## 2022-10-07 DIAGNOSIS — G620 Drug-induced polyneuropathy: Secondary | ICD-10-CM | POA: Insufficient documentation

## 2022-10-07 DIAGNOSIS — R197 Diarrhea, unspecified: Secondary | ICD-10-CM | POA: Insufficient documentation

## 2022-10-07 DIAGNOSIS — Z79899 Other long term (current) drug therapy: Secondary | ICD-10-CM | POA: Insufficient documentation

## 2022-10-07 DIAGNOSIS — R52 Pain, unspecified: Secondary | ICD-10-CM

## 2022-10-07 DIAGNOSIS — Z87891 Personal history of nicotine dependence: Secondary | ICD-10-CM | POA: Diagnosis not present

## 2022-10-07 DIAGNOSIS — T451X5D Adverse effect of antineoplastic and immunosuppressive drugs, subsequent encounter: Secondary | ICD-10-CM | POA: Diagnosis not present

## 2022-10-07 DIAGNOSIS — C3412 Malignant neoplasm of upper lobe, left bronchus or lung: Secondary | ICD-10-CM | POA: Diagnosis not present

## 2022-10-07 MED ORDER — PREGABALIN 100 MG PO CAPS: 100.0000 mg | ORAL_CAPSULE | Freq: Two times a day (BID) | ORAL | 0 refills | Status: DC

## 2022-10-07 NOTE — Progress Notes (Signed)
Symptom Management Clinic [facility] Telephone:(336) [phone] Fax:(336) [phone]  Patient Care Team: Tower, Audrie Gallus, MD as PCP - General Glory Buff, RN as Oncology Nurse Navigator Earna Coder, MD as Consulting Physician (Internal Medicine)   NAME OF PATIENT: [name] [name]  [id number]  [birth year]   DATE OF VISIT: 10/07/22  REASON FOR CONSULT: [name] [name] is a 72 y.o. female with multiple medical problems including stage II squamous cell carcinoma left lung status post resection followed by 4 cycles adjuvant cisplatin/Taxotere now on adjuvant Keytruda.   INTERVAL HISTORY: Patient presents [facility] for evaluation of worsening generalized pain since starting Keytruda.  She has a long history of fibromyalgia, which patient describes as being relatively well-controlled on duloxetine and Lyrica and as needed tramadol until she started [place].  Patient now reports poorly controlled pain and says that she hurts from head to toe primarily in the muscles and joints.  She denies fever or chills.  No rashes.  Denies insomnia or night sweats.  No changes in appetite.  She does endorse intermittent loose stools following Keytruda treatments but states that they improve after 3 to 4 days.  She denies GU symptoms.  Denies other symptomatic complaints.  PAST MEDICAL HISTORY: Past Medical History:  Diagnosis Date   Arthritis    OA   Carpal tunnel syndrome    Depression    Endometriosis    Fibromyalgia    Hyperlipidemia    Left upper lobe pulmonary nodule 11/2021   Post herpetic neuralgia    Sleep disorder    Tobacco abuse     PAST SURGICAL HISTORY:  Past Surgical History:  Procedure Laterality Date   ABDOMINAL HYSTERECTOMY  1992   total endometriosis   APPENDECTOMY  1992   bladder tack  1992   CATARACT EXTRACTION W/PHACO Right 03/25/2018   Procedure: CATARACT EXTRACTION PHACO AND INTRAOCULAR LENS PLACEMENT (IOC) RIGHT;  Surgeon: Elliot Cousin, MD;  Location: [facility] ORS;  Service: Ophthalmology;  Laterality: Right;  [place]  01:11 CDE 8.02 Fluid pack lot # [id number]   CATARACT EXTRACTION W/PHACO Left 04/22/2018   Procedure: CATARACT EXTRACTION PHACO AND INTRAOCULAR LENS PLACEMENT (IOC) Left Eye;  Surgeon: Elliot Cousin, MD;  Location: [facility] ORS;  Service: Ophthalmology;  Laterality: Left;  [place]  01:02 CDE 8.11 Fluid pack lot # [id number]   COLONOSCOPY     FOOT SURGERY Right 04/2016   [facility] in [place] great toe; straightening others   INTERCOSTAL NERVE BLOCK Left 12/12/2021   Procedure: INTERCOSTAL NERVE BLOCK;  Surgeon: Loreli Slot, MD;  Location: [facility] OR;  Service: Thoracic;  Laterality: Left;   IR IMAGING GUIDED PORT INSERTION  02/27/2022   LYMPH NODE DISSECTION Left 12/12/2021   Procedure: LYMPH NODE DISSECTION;  Surgeon: Loreli Slot, MD;  Location: [facility] OR;  Service: Thoracic;  Laterality: Left;   VIDEO BRONCHOSCOPY N/A 12/16/2021   Procedure: VIDEO BRONCHOSCOPY;  Surgeon: Loreli Slot, MD;  Location: [facility] OR;  Service: Thoracic;  Laterality: N/A;    HEMATOLOGY/ONCOLOGY HISTORY:  Oncology History Overview Note  JULY 2023- 1. 10 mm mean diameter nodule in the left upper lobe adjacent to an area of pleural and parenchymal scarring.     SEP 2023- 1. Hypermetabolic spiculated nodular left upper lobe pulmonary lesion is compatible with primary bronchogenic neoplasm. 2. Mildly metabolic nodularity in the left hilum, without discrete adenopathy may reflect early nodal disease involvement. Suggest attention on follow-up imaging. 3. No evidence of distant hypermetabolic  metastatic disease. 4. Hypermetabolic nodule in the left lobe of the thyroid, Recommend thyroid US and biopsy (ref: J Am Coll Radiol. 2015 Feb;12(2): 143-50). 5. Aortic Atherosclerosis (ICD10-I70.0) and Emphysema (ICD10-J43.9).  # LUNG: Resection  Synchronous Tumors: Not applicable Total Number of Primary Tumors:  1 Procedure: Lobectomy, lung Specimen Laterality: Left Tumor Focality: Unifocal Tumor Site: Upper lobe Tumor Size: 2.6 cm Histologic Type: Squamous cell carcinoma, moderately differentiated Visceral Pleura Invasion: Not identified Direct Invasion of Adjacent Structures: No adjacent structures present Lymphovascular Invasion: Not identified Margins: All margins negative for invasive carcinoma      Closest Margin(s) to Invasive Carcinoma: Bronchovascular margin      Margin(s) Involved by Invasive Carcinoma: Not applicable       Margin Status for Non-Invasive Tumor: Negative Treatment Effect: No known presurgical therapy Regional Lymph Nodes:      Number of Lymph Nodes Involved: 1                           Nodal Sites with Tumor: Level 10      Number of Lymph Nodes Examined: 21                      Nodal Sites Examined: Levels 4, 5, 7, 9, 10, 11, 12 and 13 Distant Metastasis:      Distant Site(s) Involved: Not applicable Pathologic Stage Classification (pTNM, AJCC 8th Edition): pT1c, pN1 Ancillary Studies: Can be performed upon request Representative Tumor Block: B5 Comment(s): None  # DEC 14th, 2023- Cisplatin-Taxotere #1; D-2 Greggory Keen x4 cycles [finished FEB 2024]  # April 15th, 2024- Keytruda q 3 W x1 year    Lung cancer (HCC)  12/05/2021 Initial Diagnosis   Lung cancer (HCC)   12/18/2021 Cancer Staging   Staging form: Lung, AJCC 8th Edition - Pathologic stage from 12/18/2021: Stage IIB (pT1c, pN1, cM0) - Signed by Loreli Slot, MD on 12/18/2021 Histopathologic type: Squamous cell carcinoma, NOS   Cancer of upper lobe of left lung (HCC)  01/03/2022 Initial Diagnosis   Cancer of upper lobe of left lung (HCC)   01/03/2022 Cancer Staging   Staging form: Lung, AJCC 8th Edition - Pathologic: Stage IIB (pT1c, pN1, cM0) - Signed by Earna Coder, MD on 01/03/2022   02/13/2022 - 04/18/2022 Chemotherapy   Patient is on Treatment Plan : LUNG Cisplatin + Docetaxel  q21d     06/16/2022 -  Chemotherapy   Patient is on Treatment Plan : LUNG NSCLC Pembrolizumab (200) q21d       ALLERGIES:  has No Known Allergies.  MEDICATIONS:  Current Outpatient Medications  Medication Sig Dispense Refill   acetaminophen (TYLENOL) 500 MG tablet Take 500 mg by mouth every 6 (six) hours as needed.     aspirin 81 MG tablet Take 81 mg by mouth daily.     busPIRone (BUSPAR) 15 MG tablet Take 0.5 tablets (7.5 mg total) by mouth 2 (two) times daily. 90 tablet 2   Cobalamin Combinations (NEURIVA PLUS PO) Take by mouth.     docusate sodium (COLACE) 100 MG capsule Take 200 mg by mouth daily.     DULoxetine (CYMBALTA) 60 MG capsule TAKE 1 CAPSULE BY MOUTH EVERY DAY 90 capsule 1   hydrOXYzine (ATARAX) 10 MG tablet Take 1 tablet (10 mg total) by mouth 2 (two) times daily as needed for itching (May cause drowsiness). 90 tablet 1   lidocaine-prilocaine (EMLA) cream Apply on the port. 30 -45  min  prior to port access. 30 g 3   MAGNESIUM CITRATE PO Take by mouth daily.     Melatonin 10 MG CAPS Take 20 mg by mouth at bedtime.     naproxen sodium (ALEVE) 220 MG tablet Take 440 mg by mouth daily as needed (headaches).     nicotine (NICODERM CQ - DOSED IN MG/24 HR) 7 mg/24hr patch Place 1 patch (7 mg total) onto the skin daily. 28 patch 2   ondansetron (ZOFRAN) 8 MG tablet One pill every 8 hours as needed for nausea/vomitting. 40 tablet 1   pregabalin (LYRICA) 75 MG capsule TAKE 1 CAPSULE BY MOUTH TWICE A DAY 180 capsule 1   prochlorperazine (COMPAZINE) 10 MG tablet Take 1 tablet (10 mg total) by mouth every 6 (six) hours as needed for nausea or vomiting. 40 tablet 1   senna (SENOKOT) 8.6 MG tablet Take 1 tablet (8.6 mg total) by mouth 2 (two) times daily as needed for constipation. 14 tablet 0   Tiotropium Bromide-Olodaterol (STIOLTO RESPIMAT) 2.5-2.5 MCG/ACT AERS Inhale 2 puffs into the lungs daily. 4 g 0   traMADol (ULTRAM) 50 MG tablet Take 1 tablet (50 mg total) by mouth every 8  (eight) hours as needed. 90 tablet 0   No current facility-administered medications for this visit.   Facility-Administered Medications Ordered in Other Visits  Medication Dose Route Frequency Provider Last Rate Last Admin   0.9 %  sodium chloride infusion   Intravenous Continuous Rushie Chestnut, PA-C   Stopped at 02/17/22 1214   heparin lock flush 100 UNIT/ML injection            heparin lock flush 100 UNIT/ML injection            sodium chloride flush (NS) 0.9 % injection 10 mL  10 mL Intravenous Once Alinda Dooms, NP        VITAL SIGNS: BP 108/75   Pulse 75   Temp 97.7 F (36.5 C) (Tympanic)   Resp 20  There were no vitals filed for this visit.  Estimated body mass index is 29.7 kg/m as calculated from the following:   Height as of 10/03/22: 5\' 5"  (1.651 m).   Weight as of 10/03/22: 178 lb 8 oz (81 kg).  LABS: CBC:    Component Value Date/Time   WBC 6.2 09/30/2022 0903   WBC 10.2 04/16/2022 0826   HGB 10.9 (L) 09/30/2022 0903   HCT 33.0 (L) 09/30/2022 0903   PLT 260 09/30/2022 0903   MCV 85.1 09/30/2022 0903   NEUTROABS 3.9 09/30/2022 0903   LYMPHSABS 1.6 09/30/2022 0903   MONOABS 0.5 09/30/2022 0903   EOSABS 0.2 09/30/2022 0903   BASOSABS 0.0 09/30/2022 0903   Comprehensive Metabolic Panel:    Component Value Date/Time   NA 135 09/30/2022 0903   K 4.0 09/30/2022 0903   CL 103 09/30/2022 0903   CO2 24 09/30/2022 0903   BUN 11 09/30/2022 0903   CREATININE 0.64 09/30/2022 0903   GLUCOSE 134 (H) 09/30/2022 0903   CALCIUM 9.2 09/30/2022 0903   AST 14 (L) 09/30/2022 0903   ALT 10 09/30/2022 0903   ALKPHOS 92 09/30/2022 0903   BILITOT 0.5 09/30/2022 0903   PROT 6.7 09/30/2022 0903   ALBUMIN 3.2 (L) 09/30/2022 0903    RADIOGRAPHIC STUDIES: DG Knee Complete 4 Views Right  Result Date: 10/03/2022 CLINICAL DATA:  Right knee pain. Fall 3 weeks ago. [name] is tender. EXAM: RIGHT KNEE - COMPLETE 4+ VIEW COMPARISON:  09/16/2016 FINDINGS: Right knee is located  without a fracture or large joint effusion. There may be mild chondrocalcinosis in the knee joint. Normal alignment. Spurring and degenerative changes at the patellofemoral compartment of the knee. IMPRESSION: 1. No acute bone abnormality to the right knee. 2. Degenerative changes at the patellofemoral compartment of the knee. 3. Possible chondrocalcinosis. Electronically Signed   By: Richarda Overlie M.D.   On: 10/03/2022 12:09    PERFORMANCE STATUS (ECOG) : 1 - Symptomatic but completely ambulatory  Review of Systems Unless otherwise noted, a complete review of systems is negative.  Physical Exam General: NAD Cardiovascular: regular rate and rhythm Pulmonary: clear ant fields Abdomen: soft, nontender, + bowel sounds GU: no suprapubic tenderness Extremities: no edema, no joint deformities Skin: no rashes Neurological: Weakness but otherwise nonfocal  IMPRESSION/PLAN: Stage II squamous cell carcinoma left lung -  status post resection followed by 4 cycles adjuvant cisplatin/Taxotere now on adjuvant Keytruda.  Patient received cycle 6 Keytruda on 09/30/2022.  Generalized pain -most likely secondary to her fibromyalgia.  Unlikely to be exacerbated by immunotherapy.  Patient on max dose of duloxetine.  She is on Lyrica 75 mg twice daily.  Discussed option of increasing Lyrica to 100 mg twice daily.  PCP has been prescribing and I reached out to Dr. Milinda Antis who agreed with medication change.  Continue as needed tramadol.  Discussed with Dr. Donneta Romberg and will also send referral to rheumatology.  Diarrhea -improving.  Patient declined workup today.  Case and plan discussed with Dr. Donneta Romberg  Patient expressed understanding and was in agreement with this plan. She also understands that She can call clinic at any time with any questions, concerns, or complaints.   Thank you for allowing me to participate in the care of this very pleasant patient.   Time Total: 20 minutes  Visit consisted of  counseling and education dealing with the complex and emotionally intense issues of symptom management in the setting of serious illness.Greater than 50%  of this time was spent counseling and coordinating care related to the above assessment and plan.  Signed by: Laurette Schimke, PhD, NP-C

## 2022-10-08 ENCOUNTER — Telehealth: Payer: Self-pay | Admitting: Internal Medicine

## 2022-10-08 ENCOUNTER — Other Ambulatory Visit: Payer: Self-pay

## 2022-10-08 ENCOUNTER — Other Ambulatory Visit: Payer: Self-pay | Admitting: Internal Medicine

## 2022-10-08 ENCOUNTER — Other Ambulatory Visit: Payer: Self-pay | Admitting: *Deleted

## 2022-10-08 DIAGNOSIS — C3412 Malignant neoplasm of upper lobe, left bronchus or lung: Secondary | ICD-10-CM

## 2022-10-08 NOTE — Telephone Encounter (Signed)
Pt called and left vm stating that she is not doing treatment anymore and wants to be called back on the phone number listed as "home phone"

## 2022-10-08 NOTE — Telephone Encounter (Signed)
Labs added.

## 2022-10-08 NOTE — Telephone Encounter (Signed)
Name of Medication: Tramadol Name of Pharmacy: [facility] Date and Quantity: 05/23/22 #90 tabs/ 0 refills Last Office Visit and Type: Fall 10/03/22 Next Office Visit and Type: none scheduled      Lipitor not on med list  Cymbalta last filled on 03/14/22 #90 caps with 1 refill

## 2022-10-08 NOTE — Telephone Encounter (Signed)
I did not fill atorvastatin-please ask her what the status is  Looks like it was d/c after hospitalization but I am unsure why

## 2022-10-10 ENCOUNTER — Telehealth: Payer: Self-pay | Admitting: Family Medicine

## 2022-10-10 NOTE — Telephone Encounter (Signed)
Patient's daughter contacted the office with a few concerns, states [facility] faxed a request to refill patient's medication atorvastatin. Patient's daughter says medication was denied for refill, and patient was not sure why. Upon looking at medication history, I saw this medication was reported as not taking in October 2023 and patient was asked to stop taking it upon discharge. Patient is wanting to know if Dr. Milinda Antis would like her to resume taking this? Daughter says she believes the patient has continuously taken it all this time. Daughter also wanted to ask if there is anything Dr. Milinda Antis can do to help with pain patient is experiencing? States patient was referred to Rheumatology but is experiencing a lot of pain. Patient takes tramadol but daughter says it does not seem to help[ much. Please advise patient or patient's daughter if needed (Ok per [facility]).

## 2022-10-10 NOTE — Telephone Encounter (Signed)
Sometimes statin medicines like atorvastatin can cause worsened muscle or joint pain  Please hold it for a week and then let me know if there is any improvement before we decide to refill  Then will female plan from there    Thanks for the heads up and so sorry she is suffering

## 2022-10-10 NOTE — Telephone Encounter (Signed)
Daughter returned call. Reviewed information will call back next week with update.

## 2022-10-10 NOTE — Telephone Encounter (Signed)
Left message to return call to our office.  

## 2022-10-13 ENCOUNTER — Telehealth: Payer: Self-pay | Admitting: *Deleted

## 2022-10-13 NOTE — Telephone Encounter (Signed)
The referral is in epic and was entered for [facility] [place] on 10/07/22 for "generalized pain". I see that the referral has been reviewed by the LPN. It's in "pending review status."  Unfortunately, we can not influence the pace at which the referral apt is scheduled. I am sure the office will call the patient/family once the referral has been reviewed by the medical team. Daughter can call [phone] to follow-up on referral.  I can reach out to the daughter via mychart to let her know that we received your messages/ concerns.  As far as DPR goes, patient has signed a release for [facility] to communicate any information to Sherian Rein and Jaquita Folds. This document is scanned into media.

## 2022-10-13 NOTE — Telephone Encounter (Signed)
Daughter called stating she had called [facility] and they stated they did not have any information regarding a referral from Elouise Munroe, NP.   I informed daughter that there was a referral in and I will let providers team know of status. Daughter also requested a copy of HIPAA be sent with referral info as well.

## 2022-10-14 ENCOUNTER — Ambulatory Visit: Payer: Medicare Other | Admitting: Family Medicine

## 2022-10-14 NOTE — Progress Notes (Signed)
Hollis Tuller T. Nickalos Petersen, MD, CAQ Sports Medicine [facility] [address] [place], [address]  Phone: [phone]  FAX: [phone]  MARKAY DORSCHNER - 72 y.o. female  MRN [id number]  Date of Birth: 02/13/1951  Date: 10/15/2022  PCP: Judy Pimple, MD  Referral: Judy Pimple, MD  Chief Complaint  Patient presents with   Hand Swelling    Left    Knee Pain    Right   Subjective:   [name] [name] is a 72 y.o. very pleasant female patient with Body mass index is 30.16 kg/m. who presents with the following:  Patient presents today with some ongoing hand swelling and pain.  On chart review, it looks like she has been having multijoint pain, and she was referred to rheumatology on October 07, 2022.  She does have a history of fibromyalgia.  In setting of left sided lung cancer  Chemo cisplatin and taxotere  Left upper lobe resection  Keytruda   She currently is taking Lyrica 75 mg p.o. twice daily, Cymbalta 60 mg daily, tramadol as needed.  Also taking hydroxyzine and Aleve.  She is also using some topical lidocaine. - Increased Lyrica to 100 mg by Dr. Milinda Antis.  Recent x-ray from October 03, 2022 shows some patellofemoral joint osteoarthritis.  On further chart review, it looks like the patient is having diffuse polyarthralgia and myalgia.  Of note, she is currently taking Keytruda. - Keytruda stopped last month.   For the past week, L hand has been swollen.   Not right now.  Generally, this is not really bothering her at all right now.  R knee is the main thing. Larey Seat about a month ago.  She is having both medial and lateral joint line tenderness.  She has had some intermittent swelling.  She has not had any buckling or additional falls after her initial fall 1 month ago. -She has no history of major knee injury, fracture, dislocation, or operative intervention.  L and R frozen - not why here, but definitely there The patient remarks to  me that she has also been having some shoulder pain.  On exam, she has multiplanar loss of motion L > R in a timeframe of several months.  She has not had any kind of accident or injury.  She has a deep dull ache in the shoulder, and she bothers her quite a bit at nighttime.  Review of Systems is noted in the HPI, as appropriate  Objective:   BP 92/60 (BP Location: Left Arm, Patient Position: Sitting, Cuff Size: Normal)   Pulse 86   Temp 97.7 F (36.5 C) (Temporal)   Ht 5\' 5"  (1.651 m)   Wt 181 lb 4 oz (82.2 kg)   SpO2 95%   BMI 30.16 kg/m   GEN: No acute distress; alert,appropriate. PULM: Breathing comfortably in no respiratory distress PSYCH: Normally interactive.   Neck has normal flexion, extension, lateral bending, and rotation maneuvers.  Spurling's is negative. She is entirely neurovascularly intact in the upper extremities.  Strength and sensation normal.   Shoulder: R and L Inspection: No muscle wasting or winging Ecchymosis/edema: neg  AC joint, scapula, clavicle: NT ABNORMAL SIDE TESTED: L UNLESS OTHERWISE NOTED, THE CONTRALATERAL SIDE HAS FULL RANGE OF MOTION. Abduction: 5/5, LIMITED TO 140 DEGREES Flexion: 5/5, LIMITED TO 140 DEGNO ROM  IR, lift-off: 5/5. TESTED AT 90 DEGREES OF ABDUCTION, LIMITED TO 5 DEGREES ER at neutral:  5/5, TESTED AT 90  DEGREES OF ABDUCTION, LIMITED TO 55 DEGREES AC crossover and compression: PAIN Drop Test: neg Empty Can: neg Supraspinatus insertion: NT Bicipital groove: NT ALL OTHER SPECIAL TESTING EQUIVOCAL GIVEN LOSS OF MOTION Grip 5/5    Right knee: Full extension.  Flexion to 115.  She does have some pain with loading the medial and lateral patellar facets, with the patellar motion is actually quite good. She does have medial lateral joint line tenderness Stable to varus and valgus stress.  ACL and PCL are intact. Flexion pinch and McMurray's do induce pain without mechanical symptoms. No pain at the tibial plateau No pain at  the quad or patellar tendons Strength is 5/5 in all directions at the knee  Laboratory and Imaging Data: DG Knee Complete 4 Views Right  Result Date: 10/03/2022 CLINICAL DATA:  Right knee pain. Fall 3 weeks ago. Patella is tender. EXAM: RIGHT KNEE - COMPLETE 4+ VIEW COMPARISON:  09/16/2016 FINDINGS: Right knee is located without a fracture or large joint effusion. There may be mild chondrocalcinosis in the knee joint. Normal alignment. Spurring and degenerative changes at the patellofemoral compartment of the knee. IMPRESSION: 1. No acute bone abnormality to the right knee. 2. Degenerative changes at the patellofemoral compartment of the knee. 3. Possible chondrocalcinosis. Electronically Signed   By: Richarda Overlie M.D.   On: 10/03/2022 12:09     Assessment and Plan:     ICD-10-CM   1. Polyarthralgia  M25.50     2. Myalgia  M79.10     3. Adhesive capsulitis of left shoulder  M75.02     4. Acute pain of right knee  M25.561     5. Fibromyalgia  M79.7     6. 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  Confounding factors include fibromyalgia longstanding as well as ongoing Keytruda use.  Keytruda certainly causes polyarthropathy, but I am not sure how long that would last after her discontinuation a few weeks ago.  Oncology probably has a greater sense of this than me.  The right knee moves fairly well.  X-rays actually look fairly good, she does have some minor patellofemoral joint osteoarthritis.  Anticipate acute on chronic knee arthritis with exacerbation.  Will do a trial of oral steroids first, and if symptoms persist I can always inject her knee, as well.  Frozen shoulder, left greater than right.  Glenohumeral joint looks to be preserved on recent CT chest.  This is often a challenging condition with multi month involvement.  I gave her frozen shoulder protocol from [facility].  Hopefully symptoms will improve off of  Keytruda.  I am going to give her a course of some oral steroids to take.  She also has an upcoming rheumatology appointment.  She did have a recently elevated CRP, but prior sed rate, ANA, and rheumatoid factors have been normal.  Medication Management during today's office visit: Meds ordered this encounter  Medications   predniSONE (DELTASONE) 20 MG tablet    Sig: 2 tabs po daily for 5 days, then 1 tab po daily for 5 days    Dispense:  15 tablet    Refill:  0   Disposition: She is following up with rheumatology, but if symptoms persist and she would like I am always happy to see her in the future.  I would wait at least 6 weeks before follow-up.  [employer] speech-to-text software was used for transcription  in this dictation.  Possible transcriptional errors can occur using Animal nutritionist.   Signed,  Elpidio Galea. Avenell Sellers, MD   Outpatient Encounter Medications as of 10/15/2022  Medication Sig   acetaminophen (TYLENOL) 500 MG tablet Take 500 mg by mouth every 6 (six) hours as needed.   aspirin 81 MG tablet Take 81 mg by mouth daily.   busPIRone (BUSPAR) 15 MG tablet Take 0.5 tablets (7.5 mg total) by mouth 2 (two) times daily.   Cobalamin Combinations (NEURIVA PLUS PO) Take by mouth.   docusate sodium (COLACE) 100 MG capsule Take 200 mg by mouth daily.   DULoxetine (CYMBALTA) 60 MG capsule TAKE 1 CAPSULE BY MOUTH ONCE DAILY   hydrOXYzine (ATARAX) 10 MG tablet Take 1 tablet (10 mg total) by mouth 2 (two) times daily as needed for itching (May cause drowsiness).   lidocaine-prilocaine (EMLA) cream Apply on the port. 30 -45 min  prior to port access.   MAGNESIUM CITRATE PO Take by mouth daily.   Melatonin 10 MG CAPS Take 20 mg by mouth at bedtime.   naproxen sodium (ALEVE) 220 MG tablet Take 440 mg by mouth daily as needed (headaches).   nicotine (NICODERM CQ - DOSED IN MG/24 HR) 7 mg/24hr patch Place 1 patch (7 mg total) onto the skin daily.   ondansetron (ZOFRAN) 8 MG tablet  One pill every 8 hours as needed for nausea/vomitting.   predniSONE (DELTASONE) 20 MG tablet 2 tabs po daily for 5 days, then 1 tab po daily for 5 days   pregabalin (LYRICA) 100 MG capsule Take 1 capsule (100 mg total) by mouth 2 (two) times daily.   prochlorperazine (COMPAZINE) 10 MG tablet Take 1 tablet (10 mg total) by mouth every 6 (six) hours as needed for nausea or vomiting.   senna (SENOKOT) 8.6 MG tablet Take 1 tablet (8.6 mg total) by mouth 2 (two) times daily as needed for constipation.   Tiotropium Bromide-Olodaterol (STIOLTO RESPIMAT) 2.5-2.5 MCG/ACT AERS Inhale 2 puffs into the lungs daily.   traMADol (ULTRAM) 50 MG tablet Take 1 tablet (50 mg total) by mouth 3 (three) times daily as needed for severe pain.   Facility-Administered Encounter Medications as of 10/15/2022  Medication   0.9 %  sodium chloride infusion   heparin lock flush 100 UNIT/ML injection   heparin lock flush 100 UNIT/ML injection   sodium chloride flush (NS) 0.9 % injection 10 mL

## 2022-10-15 ENCOUNTER — Ambulatory Visit: Payer: Medicare Other | Admitting: Family Medicine

## 2022-10-15 ENCOUNTER — Encounter: Payer: Self-pay | Admitting: Family Medicine

## 2022-10-15 VITALS — BP 92/60 | HR 86 | Temp 97.7°F | Ht 65.0 in | Wt 181.2 lb

## 2022-10-15 DIAGNOSIS — M7502 Adhesive capsulitis of left shoulder: Secondary | ICD-10-CM | POA: Diagnosis not present

## 2022-10-15 DIAGNOSIS — M797 Fibromyalgia: Secondary | ICD-10-CM | POA: Diagnosis not present

## 2022-10-15 DIAGNOSIS — M791 Myalgia, unspecified site: Secondary | ICD-10-CM

## 2022-10-15 DIAGNOSIS — M25561 Pain in right knee: Secondary | ICD-10-CM

## 2022-10-15 DIAGNOSIS — C3412 Malignant neoplasm of upper lobe, left bronchus or lung: Secondary | ICD-10-CM

## 2022-10-15 DIAGNOSIS — M2550 Pain in unspecified joint: Secondary | ICD-10-CM | POA: Diagnosis not present

## 2022-10-15 MED ORDER — PREDNISONE 20 MG PO TABS: ORAL_TABLET | ORAL | 0 refills | Status: DC

## 2022-10-16 ENCOUNTER — Inpatient Hospital Stay: Payer: Medicare Other

## 2022-10-16 DIAGNOSIS — R197 Diarrhea, unspecified: Secondary | ICD-10-CM | POA: Diagnosis not present

## 2022-10-16 DIAGNOSIS — M797 Fibromyalgia: Secondary | ICD-10-CM | POA: Diagnosis not present

## 2022-10-16 DIAGNOSIS — Z87891 Personal history of nicotine dependence: Secondary | ICD-10-CM | POA: Diagnosis not present

## 2022-10-16 DIAGNOSIS — Z79899 Other long term (current) drug therapy: Secondary | ICD-10-CM | POA: Diagnosis not present

## 2022-10-16 DIAGNOSIS — C3412 Malignant neoplasm of upper lobe, left bronchus or lung: Secondary | ICD-10-CM

## 2022-10-16 DIAGNOSIS — G620 Drug-induced polyneuropathy: Secondary | ICD-10-CM | POA: Diagnosis not present

## 2022-10-16 DIAGNOSIS — T451X5D Adverse effect of antineoplastic and immunosuppressive drugs, subsequent encounter: Secondary | ICD-10-CM | POA: Diagnosis not present

## 2022-10-16 LAB — C-REACTIVE PROTEIN: CRP: 9.9 mg/dL — ABNORMAL HIGH (ref <1.0)

## 2022-10-16 LAB — TSH: TSH: 1.966 u[IU]/mL (ref 0.350–4.500)

## 2022-10-16 LAB — CK: Total CK: 27 U/L — ABNORMAL LOW (ref 38–234)

## 2022-10-16 NOTE — Telephone Encounter (Signed)
Attempted to reach patient to discuss her concerns re: her lab apt.  Ok to schedule patient today for labs if she should call back. I will send a message to scheduling so they are aware.

## 2022-10-17 LAB — THYROID PANEL WITH TSH
Free Thyroxine Index: 1.6 (ref 1.2–4.9)
T3 Uptake Ratio: 26 % (ref 24–39)
T4, Total: 6.1 ug/dL (ref 4.5–12.0)
TSH: 2.14 u[IU]/mL (ref 0.450–4.500)

## 2022-10-17 LAB — ALDOLASE: Aldolase: 3.2 U/L — ABNORMAL LOW (ref 3.3–10.3)

## 2022-10-20 ENCOUNTER — Other Ambulatory Visit: Payer: Self-pay | Admitting: *Deleted

## 2022-10-20 DIAGNOSIS — Z5112 Encounter for antineoplastic immunotherapy: Secondary | ICD-10-CM

## 2022-10-21 ENCOUNTER — Inpatient Hospital Stay (HOSPITAL_BASED_OUTPATIENT_CLINIC_OR_DEPARTMENT_OTHER): Payer: Medicare Other | Admitting: Internal Medicine

## 2022-10-21 ENCOUNTER — Encounter: Payer: Self-pay | Admitting: Internal Medicine

## 2022-10-21 ENCOUNTER — Inpatient Hospital Stay: Payer: Medicare Other

## 2022-10-21 ENCOUNTER — Ambulatory Visit: Payer: Medicare Other

## 2022-10-21 DIAGNOSIS — Z5112 Encounter for antineoplastic immunotherapy: Secondary | ICD-10-CM

## 2022-10-21 DIAGNOSIS — R197 Diarrhea, unspecified: Secondary | ICD-10-CM | POA: Diagnosis not present

## 2022-10-21 DIAGNOSIS — C3412 Malignant neoplasm of upper lobe, left bronchus or lung: Secondary | ICD-10-CM

## 2022-10-21 DIAGNOSIS — G620 Drug-induced polyneuropathy: Secondary | ICD-10-CM | POA: Diagnosis not present

## 2022-10-21 DIAGNOSIS — Z95828 Presence of other vascular implants and grafts: Secondary | ICD-10-CM

## 2022-10-21 DIAGNOSIS — Z79899 Other long term (current) drug therapy: Secondary | ICD-10-CM | POA: Diagnosis not present

## 2022-10-21 DIAGNOSIS — Z87891 Personal history of nicotine dependence: Secondary | ICD-10-CM | POA: Diagnosis not present

## 2022-10-21 DIAGNOSIS — T451X5D Adverse effect of antineoplastic and immunosuppressive drugs, subsequent encounter: Secondary | ICD-10-CM | POA: Diagnosis not present

## 2022-10-21 DIAGNOSIS — M797 Fibromyalgia: Secondary | ICD-10-CM | POA: Diagnosis not present

## 2022-10-21 LAB — CMP (CANCER CENTER ONLY)
ALT: 13 U/L (ref 0–44)
AST: 17 U/L (ref 15–41)
Albumin: 3.2 g/dL — ABNORMAL LOW (ref 3.5–5.0)
Alkaline Phosphatase: 83 U/L (ref 38–126)
Anion gap: 8 (ref 5–15)
BUN: 13 mg/dL (ref 8–23)
CO2: 24 mmol/L (ref 22–32)
Calcium: 9.3 mg/dL (ref 8.9–10.3)
Chloride: 101 mmol/L (ref 98–111)
Creatinine: 0.76 mg/dL (ref 0.44–1.00)
GFR, Estimated: >60 mL/min (ref >60)
Glucose, Bld: 174 mg/dL — ABNORMAL HIGH (ref 70–99)
Potassium: 4.2 mmol/L (ref 3.5–5.1)
Sodium: 133 mmol/L — ABNORMAL LOW (ref 135–145)
Total Bilirubin: 0.2 mg/dL — ABNORMAL LOW (ref 0.3–1.2)
Total Protein: 7 g/dL (ref 6.5–8.1)

## 2022-10-21 LAB — CBC WITH DIFFERENTIAL (CANCER CENTER ONLY)
Abs Immature Granulocytes: 0.11 10*3/uL — ABNORMAL HIGH (ref 0.00–0.07)
Basophils Absolute: 0 10*3/uL (ref 0.0–0.1)
Basophils Relative: 0 %
Eosinophils Absolute: 0 10*3/uL (ref 0.0–0.5)
Eosinophils Relative: 0 %
HCT: 33.7 % — ABNORMAL LOW (ref 36.0–46.0)
Hemoglobin: 10.9 g/dL — ABNORMAL LOW (ref 12.0–15.0)
Immature Granulocytes: 1 %
Lymphocytes Relative: 14 %
Lymphs Abs: 1.4 10*3/uL (ref 0.7–4.0)
MCH: 27.4 pg (ref 26.0–34.0)
MCHC: 32.3 g/dL (ref 30.0–36.0)
MCV: 84.7 fL (ref 80.0–100.0)
Monocytes Absolute: 0.3 10*3/uL (ref 0.1–1.0)
Monocytes Relative: 3 %
Neutro Abs: 8.3 10*3/uL — ABNORMAL HIGH (ref 1.7–7.7)
Neutrophils Relative %: 82 %
Platelet Count: 396 10*3/uL (ref 150–400)
RBC: 3.98 MIL/uL (ref 3.87–5.11)
RDW: 13.7 % (ref 11.5–15.5)
WBC Count: 10.2 10*3/uL (ref 4.0–10.5)
nRBC: 0 % (ref 0.0–0.2)

## 2022-10-21 LAB — TSH: TSH: 1.269 u[IU]/mL (ref 0.350–4.500)

## 2022-10-21 MED ORDER — HEPARIN SOD (PORK) LOCK FLUSH 100 UNIT/ML IV SOLN
500.0000 [IU] | Freq: Once | INTRAVENOUS | Status: AC
  Administered 2022-10-21: 500 [IU] via INTRAVENOUS
  Filled 2022-10-21: qty 5

## 2022-10-21 MED ORDER — SODIUM CHLORIDE 0.9% FLUSH
10.0000 mL | Freq: Once | INTRAVENOUS | Status: AC
  Administered 2022-10-21: 10 mL via INTRAVENOUS
  Filled 2022-10-21: qty 10

## 2022-10-21 NOTE — Progress Notes (Signed)
[facility] CONSULT NOTE  Patient Care Team: Tower, Audrie Gallus, MD as PCP - General Glory Buff, RN as Oncology Nurse Navigator Earna Coder, MD as Consulting Physician (Internal Medicine)  CHIEF COMPLAINTS/PURPOSE OF CONSULTATION: lung cancer  #  Oncology History Overview Note  JULY 2023- 1. 10 mm mean diameter nodule in the left upper lobe adjacent to an area of pleural and parenchymal scarring.     SEP 2023- 1. Hypermetabolic spiculated nodular left upper lobe pulmonary lesion is compatible with primary bronchogenic neoplasm. 2. Mildly metabolic nodularity in the left hilum, without discrete adenopathy may reflect early nodal disease involvement. Suggest attention on follow-up imaging. 3. No evidence of distant hypermetabolic metastatic disease. 4. Hypermetabolic nodule in the left lobe of the thyroid, Recommend thyroid US and biopsy (ref: J Am Coll Radiol. 2015 Feb;12(2): 143-50). 5. Aortic Atherosclerosis (ICD10-I70.0) and Emphysema (ICD10-J43.9).  # LUNG: Resection  Synchronous Tumors: Not applicable Total Number of Primary Tumors: 1 Procedure: Lobectomy, lung Specimen Laterality: Left Tumor Focality: Unifocal Tumor Site: Upper lobe Tumor Size: 2.6 cm Histologic Type: Squamous cell carcinoma, moderately differentiated Visceral Pleura Invasion: Not identified Direct Invasion of Adjacent Structures: No adjacent structures present Lymphovascular Invasion: Not identified Margins: All margins negative for invasive carcinoma      Closest Margin(s) to Invasive Carcinoma: Bronchovascular margin      Margin(s) Involved by Invasive Carcinoma: Not applicable       Margin Status for Non-Invasive Tumor: Negative Treatment Effect: No known presurgical therapy Regional Lymph Nodes:      Number of Lymph Nodes Involved: 1                           Nodal Sites with Tumor: Level 10      Number of Lymph Nodes Examined: 21                      Nodal Sites  Examined: Levels 4, 5, 7, 9, 10, 11, 12 and 13 Distant Metastasis:      Distant Site(s) Involved: Not applicable Pathologic Stage Classification (pTNM, AJCC 8th Edition): pT1c, pN1 Ancillary Studies: Can be performed upon request Representative Tumor Block: B5 Comment(s): None  # DEC 14th, 2023- Cisplatin-Taxotere #1; D-2 Greggory Keen x4 cycles [finished FEB 2024]  # April 15th, 2024- Keytruda q 3 W  # s/p adjuvant therapy-Keytruda cycle #7/17- discontinued sec to side effects- severe joint pain/myalgias.    Lung cancer (HCC)  12/05/2021 Initial Diagnosis   Lung cancer (HCC)   12/18/2021 Cancer Staging   Staging form: Lung, AJCC 8th Edition - Pathologic stage from 12/18/2021: Stage IIB (pT1c, pN1, cM0) - Signed by Loreli Slot, MD on 12/18/2021 Histopathologic type: Squamous cell carcinoma, NOS   Cancer of upper lobe of left lung (HCC)  01/03/2022 Initial Diagnosis   Cancer of upper lobe of left lung (HCC)   01/03/2022 Cancer Staging   Staging form: Lung, AJCC 8th Edition - Pathologic: Stage IIB (pT1c, pN1, cM0) - Signed by Earna Coder, MD on 01/03/2022   02/13/2022 - 04/18/2022 Chemotherapy   Patient is on Treatment Plan : LUNG Cisplatin + Docetaxel q21d     06/16/2022 -  Chemotherapy   Patient is on Treatment Plan : LUNG NSCLC Pembrolizumab (200) q21d      HISTORY OF PRESENTING ILLNESS: Ambulating independently.  Alone.   [name] [name] 72 y.o.  female history of smoking with stage II T2 N1 squamous cell  carcinoma left lung cancer currently s/p adjuvant chemotherapy -is currently on adjuvant  immunotherapy/keytruda.   Patient complains of worsening pain body aches joint pain after Keytruda. Her pain levels live between a 6-8 on pain scale.  Bowels are normal. Appetite is good. Sleeps well. Mild neuropathy in feet.   Patients fingers tingle- improved.  No rash.   Review of Systems  Constitutional:  Negative for chills, diaphoresis, fever, malaise/fatigue  and weight loss.  HENT:  Negative for nosebleeds and sore throat.   Eyes:  Negative for double vision.  Respiratory:  Negative for cough, hemoptysis, sputum production, shortness of breath and wheezing.   Cardiovascular:  Negative for chest pain, palpitations, orthopnea and leg swelling.  Gastrointestinal:  Negative for abdominal pain, blood in stool, constipation, diarrhea, heartburn, melena, nausea and vomiting.  Genitourinary:  Negative for dysuria, frequency and urgency.  Musculoskeletal:  Positive for myalgias and neck pain. Negative for back pain and joint pain.  Skin: Negative.  Negative for itching and rash.  Neurological:  Positive for tingling. Negative for dizziness, focal weakness, weakness and headaches.  Endo/Heme/Allergies:  Does not bruise/bleed easily.  Psychiatric/Behavioral:  Negative for depression. The patient is not nervous/anxious and does not have insomnia.      MEDICAL HISTORY:  Past Medical History:  Diagnosis Date   Arthritis    OA   Carpal tunnel syndrome    Depression    Endometriosis    Fibromyalgia    Hyperlipidemia    Left upper lobe pulmonary nodule 11/2021   Post herpetic neuralgia    Sleep disorder    Tobacco abuse     SURGICAL HISTORY: Past Surgical History:  Procedure Laterality Date   ABDOMINAL HYSTERECTOMY  1992   total endometriosis   APPENDECTOMY  1992   bladder tack  1992   CATARACT EXTRACTION W/PHACO Right 03/25/2018   Procedure: CATARACT EXTRACTION PHACO AND INTRAOCULAR LENS PLACEMENT (IOC) RIGHT;  Surgeon: Elliot Cousin, MD;  Location: [facility] ORS;  Service: Ophthalmology;  Laterality: Right;  [place]  01:11 CDE 8.02 Fluid pack lot # [id number]   CATARACT EXTRACTION W/PHACO Left 04/22/2018   Procedure: CATARACT EXTRACTION PHACO AND INTRAOCULAR LENS PLACEMENT (IOC) Left Eye;  Surgeon: Elliot Cousin, MD;  Location: [facility] ORS;  Service: Ophthalmology;  Laterality: Left;  [place]  01:02 CDE 8.11 Fluid pack lot # [id number]   COLONOSCOPY     FOOT  SURGERY Right 04/2016   [facility] in [place] great toe; straightening others   INTERCOSTAL NERVE BLOCK Left 12/12/2021   Procedure: INTERCOSTAL NERVE BLOCK;  Surgeon: Loreli Slot, MD;  Location: [facility] OR;  Service: Thoracic;  Laterality: Left;   IR IMAGING GUIDED PORT INSERTION  02/27/2022   LYMPH NODE DISSECTION Left 12/12/2021   Procedure: LYMPH NODE DISSECTION;  Surgeon: Loreli Slot, MD;  Location: [facility] OR;  Service: Thoracic;  Laterality: Left;   VIDEO BRONCHOSCOPY N/A 12/16/2021   Procedure: VIDEO BRONCHOSCOPY;  Surgeon: Loreli Slot, MD;  Location: [facility] OR;  Service: Thoracic;  Laterality: N/A;    SOCIAL HISTORY: Social History   Socioeconomic History   Marital status: Widowed    Spouse name: Not on file   Number of children: 2   Years of education: Not on file   Highest education level: Not on file  Occupational History   Not on file  Tobacco Use   Smoking status: Former    Current packs/day: 0.00    Average packs/day: 1 pack/day for 55.0 years (55.0 ttl pk-yrs)  Types: Cigarettes    Start date: 12/04/1966    Quit date: 12/03/2021    Years since quitting: 0.8   Smokeless tobacco: Never  Vaping Use   Vaping status: Some Days  Substance and Sexual Activity   Alcohol use: No    Alcohol/week: 0.0 standard drinks of alcohol   Drug use: No   Sexual activity: Not Currently  Other Topics Concern   Not on file  Social History Narrative   Lives alone   Social Determinants of Health   Financial Resource Strain: Low Risk  (04/16/2022)   Overall Financial Resource Strain (CARDIA)    Difficulty of Paying Living Expenses: Not hard at all  Food Insecurity: No Food Insecurity (04/16/2022)   Hunger Vital Sign    Worried About Running Out of Food in the Last Year: Never true    Ran Out of Food in the Last Year: Never true  Transportation Needs: No Transportation Needs (04/16/2022)   PRAPARE - Administrator, Civil Service (Medical):  No    Lack of Transportation (Non-Medical): No  Physical Activity: Inactive (04/16/2022)   Exercise Vital Sign    Days of Exercise per Week: 0 days    Minutes of Exercise per Session: 0 min  Stress: No Stress Concern Present (04/16/2022)   [employer] of Occupational Health - Occupational Stress Questionnaire    Feeling of Stress : Not at all  Social Connections: Moderately Integrated (04/16/2022)   Social Connection and Isolation Panel [NHANES]    Frequency of Communication with Friends and Family: More than three times a week    Frequency of Social Gatherings with Friends and Family: More than three times a week    Attends Religious Services: More than 4 times per year    Active Member of [employer] or Organizations: Yes    Attends Banker Meetings: More than 4 times per year    Marital Status: Widowed  Intimate Partner Violence: Not At Risk (04/16/2022)   Humiliation, Afraid, Rape, and Kick questionnaire    Fear of Current or Ex-Partner: No    Emotionally Abused: No    Physically Abused: No    Sexually Abused: No    FAMILY HISTORY: Family History  Problem Relation Age of Onset   Heart disease Mother        MI   Heart disease Father        MI   Arthritis Sister        crippling arthritis   Arthritis Brother        crippling arthritis    ALLERGIES:  has No Known Allergies.  MEDICATIONS:  Current Outpatient Medications  Medication Sig Dispense Refill   acetaminophen (TYLENOL) 500 MG tablet Take 500 mg by mouth every 6 (six) hours as needed.     aspirin 81 MG tablet Take 81 mg by mouth daily.     busPIRone (BUSPAR) 15 MG tablet Take 0.5 tablets (7.5 mg total) by mouth 2 (two) times daily. 90 tablet 2   Cobalamin Combinations (NEURIVA PLUS PO) Take by mouth.     docusate sodium (COLACE) 100 MG capsule Take 200 mg by mouth daily.     DULoxetine (CYMBALTA) 60 MG capsule TAKE 1 CAPSULE BY MOUTH ONCE DAILY 90 capsule 1   hydrOXYzine (ATARAX) 10 MG tablet Take 1  tablet (10 mg total) by mouth 2 (two) times daily as needed for itching (May cause drowsiness). 90 tablet 1   lidocaine-prilocaine (EMLA) cream Apply on the port.  30 -45 min  prior to port access. 30 g 3   MAGNESIUM CITRATE PO Take by mouth daily.     Melatonin 10 MG CAPS Take 20 mg by mouth at bedtime.     naproxen sodium (ALEVE) 220 MG tablet Take 440 mg by mouth daily as needed (headaches).     nicotine (NICODERM CQ - DOSED IN MG/24 HR) 7 mg/24hr patch Place 1 patch (7 mg total) onto the skin daily. 28 patch 2   predniSONE (DELTASONE) 20 MG tablet 2 tabs po daily for 5 days, then 1 tab po daily for 5 days 15 tablet 0   pregabalin (LYRICA) 100 MG capsule Take 1 capsule (100 mg total) by mouth 2 (two) times daily. 60 capsule 0   prochlorperazine (COMPAZINE) 10 MG tablet Take 1 tablet (10 mg total) by mouth every 6 (six) hours as needed for nausea or vomiting. 40 tablet 1   senna (SENOKOT) 8.6 MG tablet Take 1 tablet (8.6 mg total) by mouth 2 (two) times daily as needed for constipation. 14 tablet 0   Tiotropium Bromide-Olodaterol (STIOLTO RESPIMAT) 2.5-2.5 MCG/ACT AERS Inhale 2 puffs into the lungs daily. 4 g 0   traMADol (ULTRAM) 50 MG tablet Take 1 tablet (50 mg total) by mouth 3 (three) times daily as needed for severe pain. 90 tablet 0   ondansetron (ZOFRAN) 8 MG tablet One pill every 8 hours as needed for nausea/vomitting. (Patient not taking: Reported on 10/21/2022) 40 tablet 1   No current facility-administered medications for this visit.   Facility-Administered Medications Ordered in Other Visits  Medication Dose Route Frequency Provider Last Rate Last Admin   0.9 %  sodium chloride infusion   Intravenous Continuous Rushie Chestnut, PA-C   Stopped at 02/17/22 1214   heparin lock flush 100 UNIT/ML injection            heparin lock flush 100 UNIT/ML injection            sodium chloride flush (NS) 0.9 % injection 10 mL  10 mL Intravenous Once Alinda Dooms, NP           .  PHYSICAL EXAMINATION: ECOG PERFORMANCE STATUS: 0 - Asymptomatic  Vitals:   10/21/22 0906  BP: 135/62  Pulse: 67  Resp: 18  Temp: 97.8 F (36.6 C)  SpO2: 98%    Filed Weights   10/21/22 0906  Weight: 179 lb (81.2 kg)     Physical Exam Vitals and nursing note reviewed.  Constitutional:      Comments:     HENT:     Head: Normocephalic and atraumatic.     Mouth/Throat:     Mouth: Mucous membranes are moist.     Pharynx: No oropharyngeal exudate.  Eyes:     Pupils: Pupils are equal, round, and reactive to light.  Cardiovascular:     Rate and Rhythm: Normal rate and regular rhythm.  Pulmonary:     Effort: No respiratory distress.     Breath sounds: No wheezing.     Comments: Decreased breath sounds bilaterally at bases.  No wheeze or crackles Abdominal:     General: Bowel sounds are normal. There is no distension.     Palpations: Abdomen is soft. There is no mass.     Tenderness: There is no abdominal tenderness. There is no guarding or rebound.  Musculoskeletal:        General: No tenderness. Normal range of motion.     Cervical back: Normal range of  motion and neck supple.  Skin:    General: Skin is warm.  Neurological:     Mental Status: She is alert and oriented to person, place, and time.  Psychiatric:        Mood and Affect: Affect normal.        Judgment: Judgment normal.      LABORATORY DATA:  I have reviewed the data as listed Lab Results  Component Value Date   WBC 10.2 10/21/2022   HGB 10.9 (L) 10/21/2022   HCT 33.7 (L) 10/21/2022   MCV 84.7 10/21/2022   PLT 396 10/21/2022   Recent Labs    09/09/22 1019 09/30/22 0903 10/21/22 0838  NA 134* 135 133*  K 4.5 4.0 4.2  CL 101 103 101  CO2 26 24 24   GLUCOSE 101* 134* 174*  BUN 23 11 13   CREATININE 0.86 0.64 0.76  CALCIUM 9.4 9.2 9.3  GFRNONAA >60 >60 >60  PROT 7.2 6.7 7.0  ALBUMIN 3.9 3.2* 3.2*  AST 19 14* 17  ALT 14 10 13   ALKPHOS 128* 92 83  BILITOT 0.3 0.5 0.2*     RADIOGRAPHIC STUDIES: I have personally reviewed the radiological images as listed and agreed with the findings in the report. DG Knee Complete 4 Views Right  Result Date: 10/03/2022 CLINICAL DATA:  Right knee pain. Fall 3 weeks ago. Patella is tender. EXAM: RIGHT KNEE - COMPLETE 4+ VIEW COMPARISON:  09/16/2016 FINDINGS: Right knee is located without a fracture or large joint effusion. There may be mild chondrocalcinosis in the knee joint. Normal alignment. Spurring and degenerative changes at the patellofemoral compartment of the knee. IMPRESSION: 1. No acute bone abnormality to the right knee. 2. Degenerative changes at the patellofemoral compartment of the knee. 3. Possible chondrocalcinosis. Electronically Signed   By: Richarda Overlie M.D.   On: 10/03/2022 12:09    ASSESSMENT & PLAN:   Cancer of upper lobe of left lung (HCC) # Left upper lobe-lung cancer moderately differentiated squamous cell carcinoma-pT1pN1- [IIB]; PDL-1 =70%. EGFR status-WILD.  Adjuvant chemotherapy-cisplatin Taxotere x 4 cycles; followed by adjuvant Pembrolizumab x1 year.  s/p 4 cycles of Cis-Taxotere-CT scan March 2024 maturing posttreatment changes from left sided left upper lobe resection with volume loss. Small left effusion. No new developing mass lesion, lymph node enlargement. Stable.   # s/p adjuvant therapy-Keytruda cycle #7/17- discontinued sec to side effects- severe joint pain/myalgias. MAY 2024- Thyroid- WNL. Patient feels comfortable with her decision to stop immunotherapy.   # Worsening fibromyalgia- ? keyutruda. DISCONTINUE Keytruda.   # Skin Itch with out rash- ?- sec to Keytruda- G-1-2- on benadryl BID; continue Atarax 10 qhs- stable.  # PN G-1 ; from Taxotere- stable.  # # IV Access: port-functional- Stable; discussed re: pro and cons of keeping the port vs. Explantation.  Will keep port for now.   pt requesting AM appts-   # DISPOSITION: # follow up in 1 month- MD; port/labs- cbc/cmp;TSH; CT chest  prior- Dr.B  All questions were answered. The patient knows to call the clinic with any problems, questions or concerns.    Earna Coder, MD 10/21/2022 9:57 AM

## 2022-10-21 NOTE — Assessment & Plan Note (Signed)
#   Left upper lobe-lung cancer moderately differentiated squamous cell carcinoma-pT1pN1- [IIB]; PDL-1 =70%. EGFR status-WILD.  Adjuvant chemotherapy-cisplatin Taxotere x 4 cycles; followed by adjuvant Pembrolizumab x1 year.  s/p 4 cycles of Cis-Taxotere-CT scan March 2024 maturing posttreatment changes from left sided left upper lobe resection with volume loss. Small left effusion. No new developing mass lesion, lymph node enlargement. Stable.   # s/p adjuvant therapy-Keytruda cycle #7/17- discontinued sec to side effects- severe joint pain/myalgias. MAY 2024- Thyroid- WNL. Patient feels comfortable with her decision to stop immunotherapy.   # Worsening fibromyalgia- ? keyutruda. DISCONTINUE Keytruda.   # Skin Itch with out rash- ?- sec to Keytruda- G-1-2- on benadryl BID; continue Atarax 10 qhs- stable.  # PN G-1 ; from Taxotere- stable.  # # IV Access: port-functional- Stable; discussed re: pro and cons of keeping the port vs. Explantation.  Will keep port for now.   pt requesting AM appts-   # DISPOSITION: # follow up in 1 month- MD; port/labs- cbc/cmp;TSH; CT chest prior- Dr.B

## 2022-10-21 NOTE — Progress Notes (Signed)
Multiple doctors have told her that her severe pain is coming from the [place]. Her pain levels live between a 6-8 on pain scale.Patient feels comfortable with her decision to stop immunotherapy. Appetite is good. Sleeps well. Mild neuropathy in feet. Bowels are normal.

## 2022-10-27 ENCOUNTER — Telehealth: Payer: Self-pay | Admitting: *Deleted

## 2022-10-27 NOTE — Telephone Encounter (Signed)
Rosey Bath called and said Dr Dimple Casey is booked out for 6 months and patient cannot wait that long to see Rheumatology, so she called [facility] on [address] in [place] and they can get her in in a week or 2 depending on the provider. She is asking we send a referral to them and call her to let her know that it was sent.

## 2022-10-28 ENCOUNTER — Other Ambulatory Visit: Payer: Self-pay | Admitting: Family Medicine

## 2022-10-28 ENCOUNTER — Encounter: Payer: Self-pay | Admitting: Internal Medicine

## 2022-10-28 MED ORDER — PREDNISONE 20 MG PO TABS: ORAL_TABLET | ORAL | 0 refills | Status: DC

## 2022-10-28 NOTE — Telephone Encounter (Signed)
I pended prednisone  Unsure if this would interfere with results of any rheumatology labs- make sure she is aware If she still wants to go forward please send to preferred pharmacy Thanks for letting me know

## 2022-10-28 NOTE — Telephone Encounter (Signed)
Spoke with daughter (on [place]) and advised her of Dr. Royden Purl comments. Rx sent to pharmacy

## 2022-10-28 NOTE — Telephone Encounter (Signed)
Patient would like to know if she could have morepredniSONE (DELTASONE) 20 MG tablet  called in for her until she gets in to she her rheumatologist on Next Thursday 11/07/22?She said that her whole body is hurting and when Dr Patsy Lager prescribed for her on 10/15/22 ,they really helped her.

## 2022-11-06 DIAGNOSIS — M79641 Pain in right hand: Secondary | ICD-10-CM | POA: Diagnosis not present

## 2022-11-06 DIAGNOSIS — M797 Fibromyalgia: Secondary | ICD-10-CM | POA: Diagnosis not present

## 2022-11-06 DIAGNOSIS — M79642 Pain in left hand: Secondary | ICD-10-CM | POA: Diagnosis not present

## 2022-11-06 DIAGNOSIS — M064 Inflammatory polyarthropathy: Secondary | ICD-10-CM | POA: Diagnosis not present

## 2022-11-06 DIAGNOSIS — M1991 Primary osteoarthritis, unspecified site: Secondary | ICD-10-CM | POA: Diagnosis not present

## 2022-11-06 DIAGNOSIS — M25511 Pain in right shoulder: Secondary | ICD-10-CM | POA: Diagnosis not present

## 2022-11-06 DIAGNOSIS — M25512 Pain in left shoulder: Secondary | ICD-10-CM | POA: Diagnosis not present

## 2022-11-06 DIAGNOSIS — R5383 Other fatigue: Secondary | ICD-10-CM | POA: Diagnosis not present

## 2022-11-13 ENCOUNTER — Ambulatory Visit
Admission: RE | Admit: 2022-11-13 | Discharge: 2022-11-13 | Disposition: A | Discharge status: Home / Self Care | Payer: Medicare Other | Source: Ambulatory Visit | Attending: Internal Medicine | Admitting: Internal Medicine

## 2022-11-13 DIAGNOSIS — C3490 Malignant neoplasm of unspecified part of unspecified bronchus or lung: Secondary | ICD-10-CM | POA: Diagnosis not present

## 2022-11-13 DIAGNOSIS — C3412 Malignant neoplasm of upper lobe, left bronchus or lung: Secondary | ICD-10-CM | POA: Diagnosis not present

## 2022-11-13 DIAGNOSIS — I700 Atherosclerosis of aorta: Secondary | ICD-10-CM | POA: Diagnosis not present

## 2022-11-13 MED ORDER — IOHEXOL 300 MG/ML  SOLN
75.0000 mL | Freq: Once | INTRAMUSCULAR | Status: AC | PRN
  Administered 2022-11-13: 75 mL via INTRAVENOUS

## 2022-11-20 ENCOUNTER — Encounter: Payer: Self-pay | Admitting: Internal Medicine

## 2022-11-20 ENCOUNTER — Inpatient Hospital Stay: Payer: Medicare Other | Admitting: Internal Medicine

## 2022-11-20 ENCOUNTER — Inpatient Hospital Stay: Payer: Medicare Other | Attending: Internal Medicine

## 2022-11-20 VITALS — BP 140/77 | HR 68 | Temp 97.5°F | Ht 65.0 in | Wt 179.9 lb

## 2022-11-20 DIAGNOSIS — M797 Fibromyalgia: Secondary | ICD-10-CM | POA: Insufficient documentation

## 2022-11-20 DIAGNOSIS — I700 Atherosclerosis of aorta: Secondary | ICD-10-CM | POA: Insufficient documentation

## 2022-11-20 DIAGNOSIS — T451X5D Adverse effect of antineoplastic and immunosuppressive drugs, subsequent encounter: Secondary | ICD-10-CM | POA: Diagnosis not present

## 2022-11-20 DIAGNOSIS — C3412 Malignant neoplasm of upper lobe, left bronchus or lung: Secondary | ICD-10-CM | POA: Insufficient documentation

## 2022-11-20 DIAGNOSIS — G620 Drug-induced polyneuropathy: Secondary | ICD-10-CM | POA: Insufficient documentation

## 2022-11-20 DIAGNOSIS — Z7952 Long term (current) use of systemic steroids: Secondary | ICD-10-CM | POA: Diagnosis not present

## 2022-11-20 DIAGNOSIS — Z95828 Presence of other vascular implants and grafts: Secondary | ICD-10-CM

## 2022-11-20 DIAGNOSIS — Z87891 Personal history of nicotine dependence: Secondary | ICD-10-CM | POA: Insufficient documentation

## 2022-11-20 LAB — CMP (CANCER CENTER ONLY)
ALT: 14 U/L (ref 0–44)
AST: 15 U/L (ref 15–41)
Albumin: 3.3 g/dL — ABNORMAL LOW (ref 3.5–5.0)
Alkaline Phosphatase: 80 U/L (ref 38–126)
Anion gap: 9 (ref 5–15)
BUN: 18 mg/dL (ref 8–23)
CO2: 26 mmol/L (ref 22–32)
Calcium: 9.9 mg/dL (ref 8.9–10.3)
Chloride: 101 mmol/L (ref 98–111)
Creatinine: 0.69 mg/dL (ref 0.44–1.00)
GFR, Estimated: >60 mL/min (ref >60)
Glucose, Bld: 112 mg/dL — ABNORMAL HIGH (ref 70–99)
Potassium: 4.2 mmol/L (ref 3.5–5.1)
Sodium: 136 mmol/L (ref 135–145)
Total Bilirubin: 0.5 mg/dL (ref 0.3–1.2)
Total Protein: 6.8 g/dL (ref 6.5–8.1)

## 2022-11-20 LAB — CBC WITH DIFFERENTIAL (CANCER CENTER ONLY)
Abs Immature Granulocytes: 0.09 10*3/uL — ABNORMAL HIGH (ref 0.00–0.07)
Basophils Absolute: 0 10*3/uL (ref 0.0–0.1)
Basophils Relative: 0 %
Eosinophils Absolute: 0.1 10*3/uL (ref 0.0–0.5)
Eosinophils Relative: 2 %
HCT: 35.3 % — ABNORMAL LOW (ref 36.0–46.0)
Hemoglobin: 11.2 g/dL — ABNORMAL LOW (ref 12.0–15.0)
Immature Granulocytes: 1 %
Lymphocytes Relative: 25 %
Lymphs Abs: 2.2 10*3/uL (ref 0.7–4.0)
MCH: 26.9 pg (ref 26.0–34.0)
MCHC: 31.7 g/dL (ref 30.0–36.0)
MCV: 84.9 fL (ref 80.0–100.0)
Monocytes Absolute: 0.6 10*3/uL (ref 0.1–1.0)
Monocytes Relative: 6 %
Neutro Abs: 6 10*3/uL (ref 1.7–7.7)
Neutrophils Relative %: 66 %
Platelet Count: 287 10*3/uL (ref 150–400)
RBC: 4.16 MIL/uL (ref 3.87–5.11)
RDW: 15.5 % (ref 11.5–15.5)
WBC Count: 9.1 10*3/uL (ref 4.0–10.5)
nRBC: 0 % (ref 0.0–0.2)

## 2022-11-20 LAB — TSH: TSH: 3.38 u[IU]/mL (ref 0.350–4.500)

## 2022-11-20 MED ORDER — SODIUM CHLORIDE 0.9% FLUSH
10.0000 mL | Freq: Once | INTRAVENOUS | Status: AC
  Administered 2022-11-20: 10 mL via INTRAVENOUS
  Filled 2022-11-20: qty 10

## 2022-11-20 MED ORDER — HEPARIN SOD (PORK) LOCK FLUSH 100 UNIT/ML IV SOLN
500.0000 [IU] | Freq: Once | INTRAVENOUS | Status: AC
  Administered 2022-11-20: 500 [IU] via INTRAVENOUS
  Filled 2022-11-20: qty 5

## 2022-11-20 NOTE — Assessment & Plan Note (Addendum)
#   2023-JULY  Left upper lobe-lung cancer moderately differentiated squamous cell carcinoma-pT1pN1- [IIB]; PDL-1 =70%. EGFR status-WILD.  Adjuvant chemotherapy  s/p 4 cycles of Cis-Taxotere-CT scan; DISCONTINUED KEYTRUDA after #7 of planned 17 [discontinued sec to side effects- severe joint pain/myalgias]   #7/17- .11/19/2022- CT Chest - No evidence of lung cancer recurrence;  No adenopathy. Postsurgical change in the LEFT upper lobe.  # Worsening fibromyalgia- ? Keyutruda on prednisone 30 mg/day; defer to Rheumatology [[place]]-   # Skin Itch with out rash- ?- sec to Keytruda- G-1-2- on benadryl BID; continue Atarax 10 qhs- stable.  # PN G-1 ; from Taxotere- stable.  # # IV Access: port-functional- Stable; discussed re: pro and cons of keeping the port vs. Explantation.  Will keep port for now.   # Vaccination: Flu shot- recommended.   #Incidental findings on Imaging  CT SEP  2024: Aortic Atherosclerosis I reviewed/discussed/counseled the patient.   pt requesting AM appts-   # DISPOSITION: # refer to IR re: port exaplantation # follow up in 6  month- MD; port/labs- cbc/cmp;Thyroid profile CT chest prior- Dr.B  # I reviewed the blood work- with the patient in detail; also reviewed the imaging independently [as summarized above]; and with the patient in detail.

## 2022-11-20 NOTE — Progress Notes (Signed)
Ct scan 11/13/22.

## 2022-11-20 NOTE — Addendum Note (Signed)
Addended by: Lesle Chris on: 11/20/2022 11:13 AM   Modules accepted: Orders

## 2022-11-20 NOTE — Progress Notes (Signed)
[facility] Center CONSULT NOTE  Patient Care Team: Tower, Audrie Gallus, MD as PCP - General Glory Buff, RN as Oncology Nurse Navigator Earna Coder, MD as Consulting Physician (Internal Medicine)  CHIEF COMPLAINTS/PURPOSE OF CONSULTATION: lung cancer  #  Oncology History Overview Note  JULY 2023- 1. 10 mm mean diameter nodule in the left upper lobe adjacent to an area of pleural and parenchymal scarring.     SEP 2023- 1. Hypermetabolic spiculated nodular left upper lobe pulmonary lesion is compatible with primary bronchogenic neoplasm. 2. Mildly metabolic nodularity in the left hilum, without discrete adenopathy may reflect early nodal disease involvement. Suggest attention on follow-up imaging. 3. No evidence of distant hypermetabolic metastatic disease. 4. Hypermetabolic nodule in the left lobe of the thyroid, Recommend thyroid US and biopsy (ref: J Am Coll Radiol. 2015 Feb;12(2): 143-50). 5. Aortic Atherosclerosis (ICD10-I70.0) and Emphysema (ICD10-J43.9).  # LUNG: Resection  Synchronous Tumors: Not applicable Total Number of Primary Tumors: 1 Procedure: Lobectomy, lung Specimen Laterality: Left Tumor Focality: Unifocal Tumor Site: Upper lobe Tumor Size: 2.6 cm Histologic Type: Squamous cell carcinoma, moderately differentiated Visceral Pleura Invasion: Not identified Direct Invasion of Adjacent Structures: No adjacent structures present Lymphovascular Invasion: Not identified Margins: All margins negative for invasive carcinoma      Closest Margin(s) to Invasive Carcinoma: Bronchovascular margin      Margin(s) Involved by Invasive Carcinoma: Not applicable       Margin Status for Non-Invasive Tumor: Negative Treatment Effect: No known presurgical therapy Regional Lymph Nodes:      Number of Lymph Nodes Involved: 1                           Nodal Sites with Tumor: Level 10      Number of Lymph Nodes Examined: 21                      Nodal Sites  Examined: Levels 4, 5, 7, 9, 10, 11, 12 and 13 Distant Metastasis:      Distant Site(s) Involved: Not applicable Pathologic Stage Classification (pTNM, AJCC 8th Edition): pT1c, pN1 Ancillary Studies: Can be performed upon request Representative Tumor Block: B5 Comment(s): None  # DEC 14th, 2023- Cisplatin-Taxotere #1; D-2 Greggory Keen x4 cycles [finished FEB 2024]  # April 15th, 2024- Keytruda q 3 W  # s/p adjuvant therapy-Keytruda cycle #7/17- discontinued sec to side effects- severe joint pain/myalgias.    Lung cancer (HCC)  12/05/2021 Initial Diagnosis   Lung cancer (HCC)   12/18/2021 Cancer Staging   Staging form: Lung, AJCC 8th Edition - Pathologic stage from 12/18/2021: Stage IIB (pT1c, pN1, cM0) - Signed by Loreli Slot, MD on 12/18/2021 Histopathologic type: Squamous cell carcinoma, NOS   Cancer of upper lobe of left lung (HCC)  01/03/2022 Initial Diagnosis   Cancer of upper lobe of left lung (HCC)   01/03/2022 Cancer Staging   Staging form: Lung, AJCC 8th Edition - Pathologic: Stage IIB (pT1c, pN1, cM0) - Signed by Earna Coder, MD on 01/03/2022   02/13/2022 - 04/18/2022 Chemotherapy   Patient is on Treatment Plan : LUNG Cisplatin + Docetaxel q21d     06/16/2022 - 09/30/2022 Chemotherapy   Patient is on Treatment Plan : LUNG NSCLC Pembrolizumab (200) q21d      HISTORY OF PRESENTING ILLNESS: Ambulating independently.  Alone.   [name] [name] 72 y.o.  female history of smoking with stage II T2 N1 squamous cell  carcinoma left lung cancer currently on surveillance discontinued adjuvant  immunotherapy/keytruda because of poor tolerance/ and review the results of CT chest.   Patient s/p evaluation with rheumatology.  Currently on prednisone 30 mg a day.  Noted to have mild to moderate improvement of her myalgias/joint pains.  Denies any worsening skin rash.  Bowels are normal. Appetite is good. Sleeps well. Mild neuropathy in feet.    Review of Systems   Constitutional:  Negative for chills, diaphoresis, fever, malaise/fatigue and weight loss.  HENT:  Negative for nosebleeds and sore throat.   Eyes:  Negative for double vision.  Respiratory:  Negative for cough, hemoptysis, sputum production, shortness of breath and wheezing.   Cardiovascular:  Negative for chest pain, palpitations, orthopnea and leg swelling.  Gastrointestinal:  Negative for abdominal pain, blood in stool, constipation, diarrhea, heartburn, melena, nausea and vomiting.  Genitourinary:  Negative for dysuria, frequency and urgency.  Musculoskeletal:  Positive for myalgias and neck pain. Negative for back pain and joint pain.  Skin: Negative.  Negative for itching and rash.  Neurological:  Positive for tingling. Negative for dizziness, focal weakness, weakness and headaches.  Endo/Heme/Allergies:  Does not bruise/bleed easily.  Psychiatric/Behavioral:  Negative for depression. The patient is not nervous/anxious and does not have insomnia.      MEDICAL HISTORY:  Past Medical History:  Diagnosis Date   Arthritis    OA   Carpal tunnel syndrome    Depression    Endometriosis    Fibromyalgia    Hyperlipidemia    Left upper lobe pulmonary nodule 11/2021   Post herpetic neuralgia    Sleep disorder    Tobacco abuse     SURGICAL HISTORY: Past Surgical History:  Procedure Laterality Date   ABDOMINAL HYSTERECTOMY  1992   total endometriosis   APPENDECTOMY  1992   bladder tack  1992   CATARACT EXTRACTION W/PHACO Right 03/25/2018   Procedure: CATARACT EXTRACTION PHACO AND INTRAOCULAR LENS PLACEMENT (IOC) RIGHT;  Surgeon: Elliot Cousin, MD;  Location: [facility] ORS;  Service: Ophthalmology;  Laterality: Right;  [place]  01:11 CDE 8.02 Fluid pack lot # [id number]   CATARACT EXTRACTION W/PHACO Left 04/22/2018   Procedure: CATARACT EXTRACTION PHACO AND INTRAOCULAR LENS PLACEMENT (IOC) Left Eye;  Surgeon: Elliot Cousin, MD;  Location: [facility] ORS;  Service: Ophthalmology;  Laterality: Left;   [place]  01:02 CDE 8.11 Fluid pack lot # [id number]   COLONOSCOPY     FOOT SURGERY Right 04/2016   [facility] in [place] great toe; straightening others   INTERCOSTAL NERVE BLOCK Left 12/12/2021   Procedure: INTERCOSTAL NERVE BLOCK;  Surgeon: Loreli Slot, MD;  Location: [facility] OR;  Service: Thoracic;  Laterality: Left;   IR IMAGING GUIDED PORT INSERTION  02/27/2022   LYMPH NODE DISSECTION Left 12/12/2021   Procedure: LYMPH NODE DISSECTION;  Surgeon: Loreli Slot, MD;  Location: [facility] OR;  Service: Thoracic;  Laterality: Left;   VIDEO BRONCHOSCOPY N/A 12/16/2021   Procedure: VIDEO BRONCHOSCOPY;  Surgeon: Loreli Slot, MD;  Location: [facility] OR;  Service: Thoracic;  Laterality: N/A;    SOCIAL HISTORY: Social History   Socioeconomic History   Marital status: Widowed    Spouse name: Not on file   Number of children: 2   Years of education: Not on file   Highest education level: Not on file  Occupational History   Not on file  Tobacco Use   Smoking status: Former    Current packs/day: 0.00    Average packs/day:  1 pack/day for 55.0 years (55.0 ttl pk-yrs)    Types: Cigarettes    Start date: 12/04/1966    Quit date: 12/03/2021    Years since quitting: 0.9   Smokeless tobacco: Never  Vaping Use   Vaping status: Some Days  Substance and Sexual Activity   Alcohol use: No    Alcohol/week: 0.0 standard drinks of alcohol   Drug use: No   Sexual activity: Not Currently  Other Topics Concern   Not on file  Social History Narrative   Lives alone   Social Determinants of Health   Financial Resource Strain: Low Risk  (04/16/2022)   Overall Financial Resource Strain (CARDIA)    Difficulty of Paying Living Expenses: Not hard at all  Food Insecurity: No Food Insecurity (04/16/2022)   Hunger Vital Sign    Worried About Running Out of Food in the Last Year: Never true    Ran Out of Food in the Last Year: Never true  Transportation Needs: No Transportation Needs  (04/16/2022)   PRAPARE - Administrator, Civil Service (Medical): No    Lack of Transportation (Non-Medical): No  Physical Activity: Inactive (04/16/2022)   Exercise Vital Sign    Days of Exercise per Week: 0 days    Minutes of Exercise per Session: 0 min  Stress: No Stress Concern Present (04/16/2022)   [employer] of Occupational Health - Occupational Stress Questionnaire    Feeling of Stress : Not at all  Social Connections: Moderately Integrated (04/16/2022)   Social Connection and Isolation Panel [NHANES]    Frequency of Communication with Friends and Family: More than three times a week    Frequency of Social Gatherings with Friends and Family: More than three times a week    Attends Religious Services: More than 4 times per year    Active Member of [employer] or Organizations: Yes    Attends Banker Meetings: More than 4 times per year    Marital Status: Widowed  Intimate Partner Violence: Not At Risk (04/16/2022)   Humiliation, Afraid, Rape, and Kick questionnaire    Fear of Current or Ex-Partner: No    Emotionally Abused: No    Physically Abused: No    Sexually Abused: No    FAMILY HISTORY: Family History  Problem Relation Age of Onset   Heart disease Mother        MI   Heart disease Father        MI   Arthritis Sister        crippling arthritis   Arthritis Brother        crippling arthritis    ALLERGIES:  has No Known Allergies.  MEDICATIONS:  Current Outpatient Medications  Medication Sig Dispense Refill   acetaminophen (TYLENOL) 500 MG tablet Take 500 mg by mouth every 6 (six) hours as needed.     aspirin 81 MG tablet Take 81 mg by mouth daily.     busPIRone (BUSPAR) 15 MG tablet Take 0.5 tablets (7.5 mg total) by mouth 2 (two) times daily. 90 tablet 2   Cobalamin Combinations (NEURIVA PLUS PO) Take by mouth.     docusate sodium (COLACE) 100 MG capsule Take 200 mg by mouth daily.     DULoxetine (CYMBALTA) 60 MG capsule TAKE 1 CAPSULE  BY MOUTH ONCE DAILY 90 capsule 1   hydrOXYzine (ATARAX) 10 MG tablet Take 1 tablet (10 mg total) by mouth 2 (two) times daily as needed for itching (May cause drowsiness). 90  tablet 1   lidocaine-prilocaine (EMLA) cream Apply on the port. 30 -45 min  prior to port access. 30 g 3   MAGNESIUM CITRATE PO Take by mouth daily.     Melatonin 10 MG CAPS Take 20 mg by mouth at bedtime.     naproxen sodium (ALEVE) 220 MG tablet Take 440 mg by mouth daily as needed (headaches).     nicotine (NICODERM CQ - DOSED IN MG/24 HR) 7 mg/24hr patch Place 1 patch (7 mg total) onto the skin daily. 28 patch 2   ondansetron (ZOFRAN) 8 MG tablet One pill every 8 hours as needed for nausea/vomitting. 40 tablet 1   predniSONE (DELTASONE) 20 MG tablet 2 tabs po daily for 5 days, then 1 tab po daily for 5 days 15 tablet 0   pregabalin (LYRICA) 100 MG capsule Take 1 capsule (100 mg total) by mouth 2 (two) times daily. 60 capsule 0   prochlorperazine (COMPAZINE) 10 MG tablet Take 1 tablet (10 mg total) by mouth every 6 (six) hours as needed for nausea or vomiting. 40 tablet 1   senna (SENOKOT) 8.6 MG tablet Take 1 tablet (8.6 mg total) by mouth 2 (two) times daily as needed for constipation. 14 tablet 0   Tiotropium Bromide-Olodaterol (STIOLTO RESPIMAT) 2.5-2.5 MCG/ACT AERS Inhale 2 puffs into the lungs daily. 4 g 0   traMADol (ULTRAM) 50 MG tablet Take 1 tablet (50 mg total) by mouth 3 (three) times daily as needed for severe pain. 90 tablet 0   No current facility-administered medications for this visit.   Facility-Administered Medications Ordered in Other Visits  Medication Dose Route Frequency Provider Last Rate Last Admin   0.9 %  sodium chloride infusion   Intravenous Continuous Rushie Chestnut, PA-C   Stopped at 02/17/22 1214   heparin lock flush 100 UNIT/ML injection            heparin lock flush 100 UNIT/ML injection            sodium chloride flush (NS) 0.9 % injection 10 mL  10 mL Intravenous Once Alinda Dooms, NP          .  PHYSICAL EXAMINATION: ECOG PERFORMANCE STATUS: 0 - Asymptomatic  Vitals:   11/20/22 0918 11/20/22 0933  BP: (!) 147/82 (!) 140/77  Pulse: 68   Temp: (!) 97.5 F (36.4 C)   SpO2: 97%     Filed Weights   11/20/22 0918  Weight: 179 lb 14.4 oz (81.6 kg)     Physical Exam Vitals and nursing note reviewed.  Constitutional:      Comments:     HENT:     Head: Normocephalic and atraumatic.     Mouth/Throat:     Mouth: Mucous membranes are moist.     Pharynx: No oropharyngeal exudate.  Eyes:     Pupils: Pupils are equal, round, and reactive to light.  Cardiovascular:     Rate and Rhythm: Normal rate and regular rhythm.  Pulmonary:     Effort: No respiratory distress.     Breath sounds: No wheezing.     Comments: Decreased breath sounds bilaterally at bases.  No wheeze or crackles Abdominal:     General: Bowel sounds are normal. There is no distension.     Palpations: Abdomen is soft. There is no mass.     Tenderness: There is no abdominal tenderness. There is no guarding or rebound.  Musculoskeletal:        General: No tenderness.  Normal range of motion.     Cervical back: Normal range of motion and neck supple.  Skin:    General: Skin is warm.  Neurological:     Mental Status: She is alert and oriented to person, place, and time.  Psychiatric:        Mood and Affect: Affect normal.        Judgment: Judgment normal.      LABORATORY DATA:  I have reviewed the data as listed Lab Results  Component Value Date   WBC 9.1 11/20/2022   HGB 11.2 (L) 11/20/2022   HCT 35.3 (L) 11/20/2022   MCV 84.9 11/20/2022   PLT 287 11/20/2022   Recent Labs    09/30/22 0903 10/21/22 0838 11/20/22 0919  NA 135 133* 136  K 4.0 4.2 4.2  CL 103 101 101  CO2 24 24 26   GLUCOSE 134* 174* 112*  BUN 11 13 18   CREATININE 0.64 0.76 0.69  CALCIUM 9.2 9.3 9.9  GFRNONAA >60 >60 >60  PROT 6.7 7.0 6.8  ALBUMIN 3.2* 3.2* 3.3*  AST 14* 17 15  ALT 10 13 14    ALKPHOS 92 83 80  BILITOT 0.5 0.2* 0.5    RADIOGRAPHIC STUDIES: I have personally reviewed the radiological images as listed and agreed with the findings in the report. CT CHEST W CONTRAST  Result Date: 11/19/2022 CLINICAL DATA:  Non-small cell lung cancer. Assess treatment response * Tracking Code: BO * EXAM: CT CHEST WITH CONTRAST TECHNIQUE: Multidetector CT imaging of the chest was performed during intravenous contrast administration. RADIATION DOSE REDUCTION: This exam was performed according to the departmental dose-optimization program which includes automated exposure control, adjustment of the mA and/or kV according to patient size and/or use of iterative reconstruction technique. CONTRAST:  75mL OMNIPAQUE IOHEXOL 300 MG/ML  SOLN COMPARISON:  None Available. FINDINGS: Cardiovascular: Port in the anterior chest wall with tip in distal SVC. Coronary artery calcification and aortic atherosclerotic calcification. Stable small ulcerated plaque along the transverse arch of the thoracic aorta measuring 10 mm. Mediastinum/Nodes: No axillary or supraclavicular adenopathy. No mediastinal or hilar adenopathy. No pericardial fluid. Esophagus normal. Lungs/Pleura: No suspicious pulmonary nodules. Normal pleural. Airways normal. Postsurgical change in LEFT upper lobe. Upper Abdomen: Limited view of the liver, kidneys, pancreas are unremarkable. Normal adrenal glands. Musculoskeletal: No aggressive IMPRESSION: 1. No evidence of lung cancer recurrence. 2. No adenopathy. 3. Postsurgical change in the LEFT upper lobe. 4.  Aortic Atherosclerosis (ICD10-I70.0). Electronically Signed   By: Genevive Bi M.D.   On: 11/19/2022 14:19    ASSESSMENT & PLAN:   Cancer of upper lobe of left lung (HCC) # 2023-JULY  Left upper lobe-lung cancer moderately differentiated squamous cell carcinoma-pT1pN1- [IIB]; PDL-1 =70%. EGFR status-WILD.  Adjuvant chemotherapy  s/p 4 cycles of Cis-Taxotere-CT scan; DISCONTINUED KEYTRUDA  after #7 of planned 17 [discontinued sec to side effects- severe joint pain/myalgias]   #7/17- .11/19/2022- CT Chest - No evidence of lung cancer recurrence;  No adenopathy. Postsurgical change in the LEFT upper lobe.  # Worsening fibromyalgia- ? Keyutruda on prednisone 30 mg/day; defer to Rheumatology [[place]]-   # Skin Itch with out rash- ?- sec to Keytruda- G-1-2- on benadryl BID; continue Atarax 10 qhs- stable.  # PN G-1 ; from Taxotere- stable.  # # IV Access: port-functional- Stable; discussed re: pro and cons of keeping the port vs. Explantation.  Will keep port for now.   # Vaccination: Flu shot- recommended.   #Incidental findings on Imaging  CT  SEP  2024: Aortic Atherosclerosis I reviewed/discussed/counseled the patient.   pt requesting AM appts-   # DISPOSITION: # refer to IR re: port exaplantation # follow up in 6  month- MD; port/labs- cbc/cmp;Thyroid profile CT chest prior- Dr.B  # I reviewed the blood work- with the patient in detail; also reviewed the imaging independently [as summarized above]; and with the patient in detail.    All questions were answered. The patient knows to call the clinic with any problems, questions or concerns.    Earna Coder, MD 11/20/2022 10:36 AM

## 2022-11-26 DIAGNOSIS — M79641 Pain in right hand: Secondary | ICD-10-CM | POA: Diagnosis not present

## 2022-11-26 DIAGNOSIS — M1991 Primary osteoarthritis, unspecified site: Secondary | ICD-10-CM | POA: Diagnosis not present

## 2022-11-26 DIAGNOSIS — M064 Inflammatory polyarthropathy: Secondary | ICD-10-CM | POA: Diagnosis not present

## 2022-11-26 DIAGNOSIS — M353 Polymyalgia rheumatica: Secondary | ICD-10-CM | POA: Diagnosis not present

## 2022-11-26 DIAGNOSIS — M797 Fibromyalgia: Secondary | ICD-10-CM | POA: Diagnosis not present

## 2022-11-26 DIAGNOSIS — R768 Other specified abnormal immunological findings in serum: Secondary | ICD-10-CM | POA: Diagnosis not present

## 2022-11-26 NOTE — H&P (Incomplete)
Chief Complaint: Patient was seen in consultation today for port removal at the request of Brahmanday,Govinda R  Referring Physician(s): Earna Coder  Supervising Physician: Irish Lack  Patient Status: [facility] - Out-pt  History of Present Illness: [name] [name] is a 72 y.o. female with pertinent PMHx of LUL squamous cell lung cancer s/p resection and chemotherapy who has now completed treatment and recent CT 11/19/22 with no evidence of cancer recurrence. Request received for port a catheter removal given it is no longer needed. Patient had their right sided internal jugular port placed with our service 02/27/2022.   Past Medical History:  Diagnosis Date   Arthritis    OA   Carpal tunnel syndrome    Depression    Endometriosis    Fibromyalgia    Hyperlipidemia    Left upper lobe pulmonary nodule 11/2021   Post herpetic neuralgia    Sleep disorder    Tobacco abuse     Past Surgical History:  Procedure Laterality Date   ABDOMINAL HYSTERECTOMY  1992   total endometriosis   APPENDECTOMY  1992   bladder tack  1992   CATARACT EXTRACTION W/PHACO Right 03/25/2018   Procedure: CATARACT EXTRACTION PHACO AND INTRAOCULAR LENS PLACEMENT (IOC) RIGHT;  Surgeon: Elliot Cousin, MD;  Location: [facility] ORS;  Service: Ophthalmology;  Laterality: Right;  [place]  01:11 CDE 8.02 Fluid pack lot # [id number]   CATARACT EXTRACTION W/PHACO Left 04/22/2018   Procedure: CATARACT EXTRACTION PHACO AND INTRAOCULAR LENS PLACEMENT (IOC) Left Eye;  Surgeon: Elliot Cousin, MD;  Location: [facility] ORS;  Service: Ophthalmology;  Laterality: Left;  [place]  01:02 CDE 8.11 Fluid pack lot # [id number]   COLONOSCOPY     FOOT SURGERY Right 04/2016   [facility] in [place] great toe; straightening others   INTERCOSTAL NERVE BLOCK Left 12/12/2021   Procedure: INTERCOSTAL NERVE BLOCK;  Surgeon: Loreli Slot, MD;  Location: [facility] OR;  Service: Thoracic;  Laterality: Left;   IR IMAGING GUIDED PORT  INSERTION  02/27/2022   LYMPH NODE DISSECTION Left 12/12/2021   Procedure: LYMPH NODE DISSECTION;  Surgeon: Loreli Slot, MD;  Location: [facility] OR;  Service: Thoracic;  Laterality: Left;   VIDEO BRONCHOSCOPY N/A 12/16/2021   Procedure: VIDEO BRONCHOSCOPY;  Surgeon: Loreli Slot, MD;  Location: [facility] OR;  Service: Thoracic;  Laterality: N/A;    Allergies: Patient has no known allergies.  Medications: Prior to Admission medications   Medication Sig Start Date End Date Taking? Authorizing Provider  acetaminophen (TYLENOL) 500 MG tablet Take 500 mg by mouth every 6 (six) hours as needed.    [provider]  aspirin 81 MG tablet Take 81 mg by mouth daily.    [provider]  busPIRone (BUSPAR) 15 MG tablet Take 0.5 tablets (7.5 mg total) by mouth 2 (two) times daily. 05/23/22   Tower, Audrie Gallus, MD  Cobalamin Combinations (NEURIVA PLUS PO) Take by mouth.    [provider]  docusate sodium (COLACE) 100 MG capsule Take 200 mg by mouth daily.    [provider]  DULoxetine (CYMBALTA) 60 MG capsule TAKE 1 CAPSULE BY MOUTH ONCE DAILY 10/08/22   Tower, Audrie Gallus, MD  hydrOXYzine (ATARAX) 10 MG tablet Take 1 tablet (10 mg total) by mouth 2 (two) times daily as needed for itching (May cause drowsiness). 09/09/22   Alinda Dooms, NP  lidocaine-prilocaine (EMLA) cream Apply on the port. 30 -45 min  prior to port access. 01/30/22   Earna Coder,  MD  MAGNESIUM CITRATE PO Take by mouth daily.    [provider]  Melatonin 10 MG CAPS Take 20 mg by mouth at bedtime.    [provider]  naproxen sodium (ALEVE) 220 MG tablet Take 440 mg by mouth daily as needed (headaches).    [provider]  nicotine (NICODERM CQ - DOSED IN MG/24 HR) 7 mg/24hr patch Place 1 patch (7 mg total) onto the skin daily. 09/01/22   Glenford Bayley, NP  ondansetron (ZOFRAN) 8 MG tablet One pill every 8 hours as needed for nausea/vomitting. 01/30/22    Earna Coder, MD  predniSONE (DELTASONE) 20 MG tablet 2 tabs po daily for 5 days, then 1 tab po daily for 5 days 10/28/22   Tower, Audrie Gallus, MD  pregabalin (LYRICA) 100 MG capsule Take 1 capsule (100 mg total) by mouth 2 (two) times daily. 10/07/22   Borders, Daryl Eastern, NP  prochlorperazine (COMPAZINE) 10 MG tablet Take 1 tablet (10 mg total) by mouth every 6 (six) hours as needed for nausea or vomiting. 01/30/22   Earna Coder, MD  senna (SENOKOT) 8.6 MG tablet Take 1 tablet (8.6 mg total) by mouth 2 (two) times daily as needed for constipation. 03/12/22   Rushie Chestnut, PA-C  Tiotropium Bromide-Olodaterol (STIOLTO RESPIMAT) 2.5-2.5 MCG/ACT AERS Inhale 2 puffs into the lungs daily. 08/01/22   Glenford Bayley, NP  traMADol (ULTRAM) 50 MG tablet Take 1 tablet (50 mg total) by mouth 3 (three) times daily as needed for severe pain. 10/08/22   Tower, Audrie Gallus, MD     Family History  Problem Relation Age of Onset   Heart disease Mother        MI   Heart disease Father        MI   Arthritis Sister        crippling arthritis   Arthritis Brother        crippling arthritis    Social History   Socioeconomic History   Marital status: Widowed    Spouse name: Not on file   Number of children: 2   Years of education: Not on file   Highest education level: Not on file  Occupational History   Not on file  Tobacco Use   Smoking status: Former    Current packs/day: 0.00    Average packs/day: 1 pack/day for 55.0 years (55.0 ttl pk-yrs)    Types: Cigarettes    Start date: 12/04/1966    Quit date: 12/03/2021    Years since quitting: 0.9   Smokeless tobacco: Never  Vaping Use   Vaping status: Some Days  Substance and Sexual Activity   Alcohol use: No    Alcohol/week: 0.0 standard drinks of alcohol   Drug use: No   Sexual activity: Not Currently  Other Topics Concern   Not on file  Social History Narrative   Lives alone   Social Determinants of Health   Financial Resource  Strain: Low Risk  (04/16/2022)   Overall Financial Resource Strain (CARDIA)    Difficulty of Paying Living Expenses: Not hard at all  Food Insecurity: No Food Insecurity (04/16/2022)   Hunger Vital Sign    Worried About Running Out of Food in the Last Year: Never true    Ran Out of Food in the Last Year: Never true  Transportation Needs: No Transportation Needs (04/16/2022)   PRAPARE - Transportation    Lack of Transportation (Medical): No    Lack of  Transportation (Non-Medical): No  Physical Activity: Inactive (04/16/2022)   Exercise Vital Sign    Days of Exercise per Week: 0 days    Minutes of Exercise per Session: 0 min  Stress: No Stress Concern Present (04/16/2022)   [employer] of Occupational Health - Occupational Stress Questionnaire    Feeling of Stress : Not at all  Social Connections: Moderately Integrated (04/16/2022)   Social Connection and Isolation Panel [NHANES]    Frequency of Communication with Friends and Family: More than three times a week    Frequency of Social Gatherings with Friends and Family: More than three times a week    Attends Religious Services: More than 4 times per year    Active Member of [employer] or Organizations: Yes    Attends Banker Meetings: More than 4 times per year    Marital Status: Widowed    Review of Systems: A 12 point ROS discussed and pertinent positives are indicated in the HPI above.  All other systems are negative.  Review of Systems  Vital Signs: There were no vitals taken for this visit.   Physical Exam  Imaging: CT CHEST W CONTRAST  Result Date: 11/19/2022 CLINICAL DATA:  Non-small cell lung cancer. Assess treatment response * Tracking Code: BO * EXAM: CT CHEST WITH CONTRAST TECHNIQUE: Multidetector CT imaging of the chest was performed during intravenous contrast administration. RADIATION DOSE REDUCTION: This exam was performed according to the departmental dose-optimization program which includes automated  exposure control, adjustment of the mA and/or kV according to patient size and/or use of iterative reconstruction technique. CONTRAST:  75mL OMNIPAQUE IOHEXOL 300 MG/ML  SOLN COMPARISON:  None Available. FINDINGS: Cardiovascular: Port in the anterior chest wall with tip in distal SVC. Coronary artery calcification and aortic atherosclerotic calcification. Stable small ulcerated plaque along the transverse arch of the thoracic aorta measuring 10 mm. Mediastinum/Nodes: No axillary or supraclavicular adenopathy. No mediastinal or hilar adenopathy. No pericardial fluid. Esophagus normal. Lungs/Pleura: No suspicious pulmonary nodules. Normal pleural. Airways normal. Postsurgical change in LEFT upper lobe. Upper Abdomen: Limited view of the liver, kidneys, pancreas are unremarkable. Normal adrenal glands. Musculoskeletal: No aggressive IMPRESSION: 1. No evidence of lung cancer recurrence. 2. No adenopathy. 3. Postsurgical change in the LEFT upper lobe. 4.  Aortic Atherosclerosis (ICD10-I70.0). Electronically Signed   By: Genevive Bi M.D.   On: 11/19/2022 14:19    Labs:  CBC: Recent Labs    09/09/22 1019 09/30/22 0903 10/21/22 0838 11/20/22 0919  WBC 5.7 6.2 10.2 9.1  HGB 12.2 10.9* 10.9* 11.2*  HCT 37.6 33.0* 33.7* 35.3*  PLT 239 260 396 287    COAGS: Recent Labs    12/10/21 1450  INR 1.0  APTT 26    BMP: Recent Labs    09/09/22 1019 09/30/22 0903 10/21/22 0838 11/20/22 0919  NA 134* 135 133* 136  K 4.5 4.0 4.2 4.2  CL 101 103 101 101  CO2 26 24 24 26   GLUCOSE 101* 134* 174* 112*  BUN 23 11 13 18   CALCIUM 9.4 9.2 9.3 9.9  CREATININE 0.86 0.64 0.76 0.69  GFRNONAA >60 >60 >60 >60    LIVER FUNCTION TESTS: Recent Labs    09/09/22 1019 09/30/22 0903 10/21/22 0838 11/20/22 0919  BILITOT 0.3 0.5 0.2* 0.5  AST 19 14* 17 15  ALT 14 10 13 14   ALKPHOS 128* 92 83 80  PROT 7.2 6.7 7.0 6.8  ALBUMIN 3.9 3.2* 3.2* 3.3*   Assessment and Plan: This is  a 72 year old female with  pertinent PMHx of LUL squamous cell lung cancer s/p resection and chemotherapy who has now completed treatment and recent CT 11/19/22 with no evidence of cancer recurrence. Request received for port a catheter removal given it is no longer needed. Patient had their right sided internal jugular port placed with our service 02/27/2022.   The patient has been NPO, labs and vitals have been reviewed.  Risks and benefits of image guided port-a-catheter placement was discussed with the patient including, but not limited to bleeding, infection, pneumothorax, or fibrin sheath development and need for additional procedures.  All of the patient's questions were answered, patient is agreeable to proceed. Consent signed and in chart.    Thank you for this interesting consult.  I greatly enjoyed meeting [name] [name] and look forward to participating in their care.  A copy of this report was sent to the requesting provider on this date.  Electronically Signed: Berneta Levins, PA-C 11/26/2022, 8:14 AM   I spent a total of 15 Minutes in face to face in clinical consultation, greater than 50% of which was counseling/coordinating care for port removal.

## 2022-11-26 NOTE — Progress Notes (Signed)
Patient for IR Port Insertion on Thurs 11/27/22, I called and spoke with the patient on the phone and gave pre-procedure instructions. Pt was made aware to be here at 1:30p. Pt stated understanding.  Called 11/26/22

## 2022-11-27 ENCOUNTER — Ambulatory Visit
Admission: RE | Admit: 2022-11-27 | Discharge: 2022-11-27 | Disposition: A | Discharge status: Home / Self Care | Payer: Medicare Other | Source: Ambulatory Visit | Attending: Internal Medicine | Admitting: Internal Medicine

## 2022-11-27 DIAGNOSIS — Z452 Encounter for adjustment and management of vascular access device: Secondary | ICD-10-CM | POA: Insufficient documentation

## 2022-11-27 DIAGNOSIS — C3412 Malignant neoplasm of upper lobe, left bronchus or lung: Secondary | ICD-10-CM | POA: Insufficient documentation

## 2022-11-27 DIAGNOSIS — C3490 Malignant neoplasm of unspecified part of unspecified bronchus or lung: Secondary | ICD-10-CM | POA: Diagnosis not present

## 2022-11-27 HISTORY — PX: IR REMOVAL TUN ACCESS W/ PORT W/O FL MOD SED: IMG2290

## 2022-11-27 MED ORDER — LIDOCAINE HCL 1 % IJ SOLN
10.0000 mL | Freq: Once | INTRAMUSCULAR | Status: AC
  Administered 2022-11-27: 10 mL via INTRADERMAL

## 2022-11-27 MED ORDER — LIDOCAINE HCL 1 % IJ SOLN
INTRAMUSCULAR | Status: AC
  Filled 2022-11-27: qty 20

## 2022-12-25 DIAGNOSIS — Z7952 Long term (current) use of systemic steroids: Secondary | ICD-10-CM | POA: Diagnosis not present

## 2022-12-25 DIAGNOSIS — M797 Fibromyalgia: Secondary | ICD-10-CM | POA: Diagnosis not present

## 2022-12-25 DIAGNOSIS — M353 Polymyalgia rheumatica: Secondary | ICD-10-CM | POA: Diagnosis not present

## 2022-12-25 DIAGNOSIS — M064 Inflammatory polyarthropathy: Secondary | ICD-10-CM | POA: Diagnosis not present

## 2022-12-25 DIAGNOSIS — M1991 Primary osteoarthritis, unspecified site: Secondary | ICD-10-CM | POA: Diagnosis not present

## 2022-12-31 ENCOUNTER — Other Ambulatory Visit: Payer: Self-pay | Admitting: Family Medicine

## 2023-01-01 NOTE — Telephone Encounter (Signed)
Name of Medication: Tramadol Name of Pharmacy: [facility] or Written Date and Quantity: 10/08/22 #90 tabs/ 0 refills Last Office Visit and Type: Fall 10/03/22 Next Office Visit and Type: none scheduled

## 2023-01-27 ENCOUNTER — Encounter: Payer: Self-pay | Admitting: Pulmonary Disease

## 2023-01-27 ENCOUNTER — Other Ambulatory Visit: Payer: Self-pay | Admitting: Family Medicine

## 2023-01-27 ENCOUNTER — Ambulatory Visit: Payer: Medicare Other | Admitting: Pulmonary Disease

## 2023-01-27 VITALS — BP 138/84 | HR 78 | Temp 97.1°F | Ht 65.0 in | Wt 193.0 lb

## 2023-01-27 DIAGNOSIS — R0602 Shortness of breath: Secondary | ICD-10-CM

## 2023-01-27 DIAGNOSIS — J449 Chronic obstructive pulmonary disease, unspecified: Secondary | ICD-10-CM

## 2023-01-27 DIAGNOSIS — F17210 Nicotine dependence, cigarettes, uncomplicated: Secondary | ICD-10-CM

## 2023-01-27 MED ORDER — STIOLTO RESPIMAT 2.5-2.5 MCG/ACT IN AERS: 2.0000 | INHALATION_SPRAY | Freq: Every day | RESPIRATORY_TRACT | 11 refills | Status: DC

## 2023-01-27 MED ORDER — NICOTINE 14 MG/24HR TD PT24: 14.0000 mg | MEDICATED_PATCH | Freq: Every day | TRANSDERMAL | 0 refills | Status: DC

## 2023-01-27 NOTE — Telephone Encounter (Signed)
Please check in with her  Is she taking the 75 or 100? Last fill was from oncology I think  Thanks

## 2023-01-27 NOTE — Progress Notes (Signed)
Subjective:    Patient ID: [name] [name], female    DOB: 1950/12/17, 72 y.o.   MRN: [id number]  Patient Care Team: Tower, Audrie Gallus, MD as PCP - General Glory Buff, RN as Oncology Nurse Navigator Earna Coder, MD as Consulting Physician (Internal Medicine)  Chief Complaint  Patient presents with   Follow-up    BACKGROUND/INTERVAL:72 year old female, current smoker 09/01/22 (55 pack year hx). PMH significant for stage 2 moderate COPD, lung cancer status post lobectomy, thoracic aortic aneurysm, osteoarthritis, hyperlipidemia, fibromyalgia here for scheduled follow-up.  Last seen 02 October 2022 by Ames Dura, NP.  At that time instructed to continue Stiolto daily.  HPI Discussed the use of AI scribe software for clinical note transcription with the patient, who gave verbal consent to proceed.  History of Present Illness   [name] [name], a 72 year old woman with a history of COPD and squamous cell carcinoma of the lung, presents for a scheduled visit. She reports a recent relapse into smoking, with no identifiable trigger. Despite her history of lung cancer and COPD, she admits to not using her Stiolto inhaler daily, as she does not feel the need for it. She reports coughing up clear sputum, which is worse at night. She has not had to use her emergency inhaler.  She has been trying to quit smoking using 7mg  nicotine patches but reports that these are not effective. She admits to smoking about ten cigarettes a day.  She reports shortness of breath and becoming winded easily, even though her oxygen levels were found to be normal during the visit.  She has declined a flu shot at this visit.      Review of Systems A 10 point review of systems was performed and it is as noted above otherwise negative.   Patient Active Problem List   Diagnosis Date Noted   Right knee injury 10/03/2022   Shortness of breath 08/04/2022   Thoracic aortic aneurysm (HCC) 06/11/2022   Stage 1 mild  COPD by GOLD classification (HCC) 06/03/2022   Cancer of upper lobe of left lung (HCC) 01/03/2022   S/P lobectomy of lung 12/12/2021   Lung cancer (HCC) 12/05/2021   Acute pain of right shoulder 08/15/2021   Elevated glucose level 01/02/2019   Elevated TSH 11/30/2018   Grief reaction 11/24/2017   Multiple joint pain 09/16/2016   Osteopenia 06/15/2016   Screening mammogram, encounter for 04/28/2016   Estrogen deficiency 04/28/2016   Medicare annual wellness visit, initial 01/02/2015   Colon cancer screening 05/31/2013   Hyperkalemia 11/30/2012   Retinopathy, bilateral 10/05/2012   Hyperlipidemia, mild 10/05/2012   Adverse effects of medication 03/24/2012   Routine general medical examination at a health care facility 01/22/2011   Generalized osteoarthritis of hand 07/06/2009   SEBACEOUS CYST, NECK 07/13/2007   SLEEP DISORDER 01/18/2007   POSTHERPETIC NEURALGIA 12/15/2006   Tobacco abuse 12/15/2006   CARPAL TUNNEL SYNDROME 12/15/2006   Osteoarthritis 12/15/2006   Fibromyalgia 12/15/2006    Social History   Tobacco Use   Smoking status: Every Day    Current packs/day: 0.50    Average packs/day: 1 pack/day for 55.2 years (55.1 ttl pk-yrs)    Types: Cigarettes    Start date: 12/04/1966    Last attempt to quit: 12/03/2021   Smokeless tobacco: Never   Tobacco comments:    10 cigarettes a day khj 01/27/2023  Substance Use Topics   Alcohol use: No    Alcohol/week: 0.0 standard drinks of alcohol  No Known Allergies  Current Meds  Medication Sig   busPIRone (BUSPAR) 15 MG tablet Take 0.5 tablets (7.5 mg total) by mouth 2 (two) times daily.   DULoxetine (CYMBALTA) 60 MG capsule TAKE 1 CAPSULE BY MOUTH ONCE DAILY   nicotine (NICODERM CQ - DOSED IN MG/24 HOURS) 14 mg/24hr patch Place 1 patch (14 mg total) onto the skin daily.   traMADol (ULTRAM) 50 MG tablet Take 1 tablet (50 mg total) by mouth 3 (three) times daily as needed for severe pain (pain score 7-10).   [DISCONTINUED]  Tiotropium Bromide-Olodaterol (STIOLTO RESPIMAT) 2.5-2.5 MCG/ACT AERS Inhale 2 puffs into the lungs daily.    Immunization History  Administered Date(s) Administered   Fluad Quad(high Dose 65+) 11/30/2018, 02/07/2020   Influenza Split 01/22/2011, 03/24/2012   Influenza, High Dose Seasonal PF 11/07/2016   Influenza,inj,Quad PF,6+ Mos 11/30/2012, 12/06/2014, 11/22/2015, 11/24/2017   Pneumococcal Conjugate-13 01/02/2015   Pneumococcal Polysaccharide-23 01/22/2011, 09/12/2016   Td 05/01/1998, 03/04/2007      Objective:   BP 138/84 (BP Location: Right Arm, Cuff Size: Normal)   Pulse 78   Temp (!) 97.1 F (36.2 C)   Ht 5\' 5"  (1.651 m)   Wt 193 lb (87.5 kg)   SpO2 100%   BMI 32.12 kg/m   SpO2: 100 % O2 Device: None (Room air)  GENERAL: Well-developed somewhat overweight woman, no acute distress. HEAD: Normocephalic, atraumatic.  EYES: Pupils equal, round, reactive to light.  No scleral icterus.  MOUTH: Dentures uppers and lowers, oral mucosa moist, Mallampati III airway. NECK: Supple. No thyromegaly. Trachea midline. No JVD.  No adenopathy. PULMONARY: Good air entry bilaterally.  Coarse, otherwise, no adventitious sounds. CARDIOVASCULAR: S1 and S2. Regular rate and rhythm.  No rubs, murmurs or gallops heard. ABDOMEN: Benign. MUSCULOSKELETAL: No joint deformity, no clubbing, no edema.  NEUROLOGIC: No overt focal deficit, no gait disturbance, speech is fluent. SKIN: Intact,warm,dry. PSYCH: Mood and behavior normal.      Assessment & Plan:     ICD-10-CM   1. Stage 2 moderate COPD by GOLD classification (HCC)  J44.9 AMB referral to pulmonary rehabilitation    2. Shortness of breath  R06.02 AMB referral to pulmonary rehabilitation    3. Tobacco dependence due to cigarettes  F17.210       Orders Placed This Encounter  Procedures   AMB referral to pulmonary rehabilitation    Referral Priority:   Routine    Referral Type:   Consultation    Number of Visits Requested:   1     Meds ordered this encounter  Medications   nicotine (NICODERM CQ - DOSED IN MG/24 HOURS) 14 mg/24hr patch    Sig: Place 1 patch (14 mg total) onto the skin daily.    Dispense:  28 patch    Refill:  0   Tiotropium Bromide-Olodaterol (STIOLTO RESPIMAT) 2.5-2.5 MCG/ACT AERS    Sig: Inhale 2 puffs into the lungs daily.    Dispense:  4 g    Refill:  11    Order Specific Question:   Lot Number?    Answer:   [id number]    Order Specific Question:   Expiration Date?    Answer:   11/02/2023    Order Specific Question:   Manufacturer?    Answer:   [employer] [12]    Order Specific Question:   Quantity    Answer:   1   Discussion:    Chronic Obstructive Pulmonary Disease (COPD) COPD with nocturnal clear sputum production and activity-related  dyspnea. Non-compliance with daily Stiolto inhaler use. Discussed the necessity of daily Stiolto inhaler for airway maintenance. Recommended pulmonary rehabilitation (36 sessions, thrice weekly, Medicare-covered with potential copay). outpatient-based rehab preferred for accountability and peer support, with home-based as an alternative. - Use Stiolto inhaler daily - Initiate pulmonary rehabilitation, outpatient-based preferred, with potential switch to home-based - Ambulatory oximetry showed no actionable desaturations  Squamous Cell Carcinoma of the Lung Squamous cell carcinoma of the lung post-partial resection. Continues smoking (~10 cigarettes/day). Discussed increasing nicotine patch to 14 mg. Explained proper use: apply in the morning, remove before bed to avoid sleep disturbances. Provided smoking cessation resources (app, [facility]'s becomeanx.org). Emphasized risks of continued smoking, including irreversible damage and increased oxygen therapy need. - Increase nicotine patch to 14 mg - Apply nicotine patch in the morning, remove before bed - Provide smoking cessation resources (app, [facility]'s becomeanex.org)  General Health  Maintenance Declined flu vaccination. - No flu shot administered per patient preference  Follow-up - Schedule follow-up visit in three months.      Gailen Shelter, MD Advanced Bronchoscopy PCCM [facility]    *This note was generated using voice recognition software/Dragon and/or AI transcription program.  Despite best efforts to proofread, errors can occur which can change the meaning. Any transcriptional errors that result from this process are unintentional and may not be fully corrected at the time of dictation.

## 2023-01-27 NOTE — Telephone Encounter (Signed)
Looks like 100 mg was sent in by another provider on 10/07/22 #60 caps/ 0 refill  Last OV was PCP was an acute appt on 10/03/22, no future appts., please advise

## 2023-01-27 NOTE — Patient Instructions (Signed)
VISIT SUMMARY:  During today's visit, we discussed your COPD and lung cancer history, as well as your recent relapse into smoking. We reviewed your current symptoms, including nighttime coughing and shortness of breath, and addressed your concerns about the effectiveness of your nicotine patches. We also talked about the importance of using your inhaler daily and explored options for pulmonary rehabilitation.  YOUR PLAN:  -CHRONIC OBSTRUCTIVE PULMONARY DISEASE (COPD): COPD is a chronic lung disease that makes it hard to breathe. It is important to use your Stiolto inhaler daily to help keep your airways open. We recommend starting pulmonary rehabilitation, preferably at a hospital for better support, but home-based rehab is also an option. We will also check your oxygen levels while walking.  -SQUAMOUS CELL CARCINOMA OF THE LUNG: This is a type of lung cancer that you have had partially removed. Continuing to smoke can worsen your condition. We suggest increasing your nicotine patch to 14 mg, applying it in the morning and removing it before bed. We also provided resources to help you quit smoking, including an app and a website from the [facility] (https://[facility].net/).   -GENERAL HEALTH MAINTENANCE: You declined the flu shot today. It is important to consider getting vaccinated in the future to protect yourself from the flu.  INSTRUCTIONS:  Please schedule a follow-up visit in three months.

## 2023-01-28 NOTE — Telephone Encounter (Signed)
Pt said she is taking 75 mg and that's the dose she would like PCP to send it  [employer]

## 2023-02-16 DIAGNOSIS — M064 Inflammatory polyarthropathy: Secondary | ICD-10-CM | POA: Diagnosis not present

## 2023-02-16 DIAGNOSIS — M797 Fibromyalgia: Secondary | ICD-10-CM | POA: Diagnosis not present

## 2023-02-16 DIAGNOSIS — Z7952 Long term (current) use of systemic steroids: Secondary | ICD-10-CM | POA: Diagnosis not present

## 2023-02-16 DIAGNOSIS — M353 Polymyalgia rheumatica: Secondary | ICD-10-CM | POA: Diagnosis not present

## 2023-02-16 DIAGNOSIS — M1991 Primary osteoarthritis, unspecified site: Secondary | ICD-10-CM | POA: Diagnosis not present

## 2023-03-22 ENCOUNTER — Encounter: Payer: Self-pay | Admitting: Internal Medicine

## 2023-04-06 ENCOUNTER — Encounter: Payer: Self-pay | Admitting: Internal Medicine

## 2023-04-07 ENCOUNTER — Other Ambulatory Visit: Payer: Self-pay | Admitting: Family Medicine

## 2023-04-07 ENCOUNTER — Encounter: Payer: Medicare Other | Admitting: Internal Medicine

## 2023-04-07 NOTE — Telephone Encounter (Signed)
Name of Medication: Tramadol Name of Pharmacy: [facility] or Written Date and Quantity: 01/01/23 #90 tabs/ 0 refills Last Office Visit and Type: Fall 10/03/22 Next Office Visit and Type: none scheduled

## 2023-04-21 ENCOUNTER — Ambulatory Visit (INDEPENDENT_AMBULATORY_CARE_PROVIDER_SITE_OTHER): Payer: Medicare HMO

## 2023-04-21 VITALS — Ht 65.0 in | Wt 193.0 lb

## 2023-04-21 DIAGNOSIS — Z0000 Encounter for general adult medical examination without abnormal findings: Secondary | ICD-10-CM | POA: Diagnosis not present

## 2023-04-21 NOTE — Patient Instructions (Addendum)
[name] [name] , Thank you for taking time to come for your Medicare Wellness Visit. I appreciate your ongoing commitment to your health goals. Please review the following plan we discussed and let me know if I can assist you in the future.   Referrals/Orders/Follow-Ups/Clinician Recommendations: none  This is a list of the screening recommended for you and due dates:  Health Maintenance  Topic Date Due   COVID-19 Vaccine (1) Never done   Zoster (Shingles) Vaccine (1 of 2) Never done   DTaP/Tdap/Td vaccine (3 - Tdap) 03/03/2017   Mammogram  12/03/2018   Cologuard (Stool DNA test)  12/02/2019   Flu Shot  06/01/2023*   Medicare Annual Wellness Visit  04/20/2024   Pneumonia Vaccine  Completed   DEXA scan (bone density measurement)  Completed   Hepatitis C Screening  Completed   HPV Vaccine  Aged Out  *Topic was postponed. The date shown is not the original due date.    Advanced directives: (Copy Requested) Please bring a copy of your health care power of attorney and living will to the office to be added to your chart at your convenience. Has HC-POA;no LW  Next Medicare Annual Wellness Visit scheduled for next year: Yes 04/21/2024 @ 2:20pm televsit

## 2023-04-21 NOTE — Progress Notes (Signed)
Subjective:   [name] [name] is a 73 y.o. female who presents for Medicare Annual (Subsequent) preventive examination.  Visit Complete: Virtual I connected with  [name] on 04/21/23 by a audio enabled telemedicine application and verified that I am speaking with the correct person using two identifiers.  Patient Location: Home  Provider Location: Home Office  I discussed the limitations of evaluation and management by telemedicine. The patient expressed understanding and agreed to proceed.  Vital Signs: Because this visit was a virtual/telehealth visit, some criteria may be missing or patient reported. Any vitals not documented were not able to be obtained and vitals that have been documented are patient reported.  Patient Medicare AWV questionnaire was completed by the patient on (not done); I have confirmed that all information answered by patient is correct and no changes since this date.  Cardiac Risk Factors include: advanced age (>56men, >7 women);dyslipidemia;obesity (BMI >30kg/m2);sedentary lifestyle;smoking/ tobacco exposure     Objective:    Today's Vitals   04/21/23 1432 04/21/23 1433  Weight: 193 lb (87.5 kg)   Height: 5\' 5"  (1.651 m)   PainSc:  7    Body mass index is 32.12 kg/m.     04/21/2023    2:45 PM 11/20/2022    9:19 AM 10/21/2022    9:05 AM 10/21/2022    8:26 AM 09/30/2022    8:54 AM 09/09/2022   11:00 AM 09/09/2022   10:23 AM  Advanced Directives  Does Patient Have a Medical Advance Directive? Yes Yes Yes Yes Yes Yes Yes  Type of Estate agent of [employer] Power of [place];Living will Healthcare Power of [place];Living will Healthcare Power of [place];Living will Healthcare Power of [place];Living will Healthcare Power of Attorney   Does patient want to make changes to medical advance directive?      No - Patient declined   Copy of Healthcare Power of Attorney in Chart? No - copy requested     No - copy  requested     Current Medications (verified) Outpatient Encounter Medications as of 04/21/2023  Medication Sig   acetaminophen (TYLENOL) 500 MG tablet Take 500 mg by mouth every 6 (six) hours as needed.   aspirin 81 MG tablet Take 81 mg by mouth daily.   busPIRone (BUSPAR) 15 MG tablet Take 0.5 tablets (7.5 mg total) by mouth 2 (two) times daily.   Cobalamin Combinations (NEURIVA PLUS PO) Take by mouth.   docusate sodium (COLACE) 100 MG capsule Take 200 mg by mouth daily.   DULoxetine (CYMBALTA) 60 MG capsule TAKE 1 CAPSULE BY MOUTH ONCE DAILY   hydrOXYzine (ATARAX) 10 MG tablet Take 1 tablet (10 mg total) by mouth 2 (two) times daily as needed for itching (May cause drowsiness).   lidocaine-prilocaine (EMLA) cream Apply on the port. 30 -45 min  prior to port access.   MAGNESIUM CITRATE PO Take by mouth daily.   Melatonin 10 MG CAPS Take 20 mg by mouth at bedtime.   naproxen sodium (ALEVE) 220 MG tablet Take 440 mg by mouth daily as needed (headaches).   nicotine (NICODERM CQ - DOSED IN MG/24 HOURS) 14 mg/24hr patch Place 1 patch (14 mg total) onto the skin daily.   ondansetron (ZOFRAN) 8 MG tablet One pill every 8 hours as needed for nausea/vomitting.   predniSONE (DELTASONE) 20 MG tablet 2 tabs po daily for 5 days, then 1 tab po daily for 5 days   pregabalin (LYRICA) 75 MG capsule TAKE ONE CAPSULE  BY MOUTH TWICE DAILY   prochlorperazine (COMPAZINE) 10 MG tablet Take 1 tablet (10 mg total) by mouth every 6 (six) hours as needed for nausea or vomiting.   senna (SENOKOT) 8.6 MG tablet Take 1 tablet (8.6 mg total) by mouth 2 (two) times daily as needed for constipation.   Tiotropium Bromide-Olodaterol (STIOLTO RESPIMAT) 2.5-2.5 MCG/ACT AERS Inhale 2 puffs into the lungs daily.   traMADol (ULTRAM) 50 MG tablet Take 1 tablet (50 mg total) by mouth 3 (three) times daily as needed for severe pain (pain score 7-10).   Facility-Administered Encounter Medications as of 04/21/2023  Medication   0.9 %   sodium chloride infusion   heparin lock flush 100 UNIT/ML injection   heparin lock flush 100 UNIT/ML injection   sodium chloride flush (NS) 0.9 % injection 10 mL    Allergies (verified) Patient has no known allergies.   History: Past Medical History:  Diagnosis Date   Arthritis    OA   Carpal tunnel syndrome    Depression    Endometriosis    Fibromyalgia    Hyperlipidemia    Left upper lobe pulmonary nodule 11/2021   Post herpetic neuralgia    Sleep disorder    Tobacco abuse    Past Surgical History:  Procedure Laterality Date   ABDOMINAL HYSTERECTOMY  1992   total endometriosis   APPENDECTOMY  1992   bladder tack  1992   CATARACT EXTRACTION W/PHACO Right 03/25/2018   Procedure: CATARACT EXTRACTION PHACO AND INTRAOCULAR LENS PLACEMENT (IOC) RIGHT;  Surgeon: Elliot Cousin, MD;  Location: [facility] ORS;  Service: Ophthalmology;  Laterality: Right;  [place]  01:11 CDE 8.02 Fluid pack lot # [id number]   CATARACT EXTRACTION W/PHACO Left 04/22/2018   Procedure: CATARACT EXTRACTION PHACO AND INTRAOCULAR LENS PLACEMENT (IOC) Left Eye;  Surgeon: Elliot Cousin, MD;  Location: [facility] ORS;  Service: Ophthalmology;  Laterality: Left;  [place]  01:02 CDE 8.11 Fluid pack lot # [id number]   COLONOSCOPY     FOOT SURGERY Right 04/2016   [facility] in [place] great toe; straightening others   INTERCOSTAL NERVE BLOCK Left 12/12/2021   Procedure: INTERCOSTAL NERVE BLOCK;  Surgeon: Loreli Slot, MD;  Location: [facility] OR;  Service: Thoracic;  Laterality: Left;   IR IMAGING GUIDED PORT INSERTION  02/27/2022   IR REMOVAL TUN ACCESS W/ PORT W/O FL MOD SED  11/27/2022   LYMPH NODE DISSECTION Left 12/12/2021   Procedure: LYMPH NODE DISSECTION;  Surgeon: Loreli Slot, MD;  Location: [facility] OR;  Service: Thoracic;  Laterality: Left;   VIDEO BRONCHOSCOPY N/A 12/16/2021   Procedure: VIDEO BRONCHOSCOPY;  Surgeon: Loreli Slot, MD;  Location: [facility] OR;  Service: Thoracic;  Laterality: N/A;    Family History  Problem Relation Age of Onset   Heart disease Mother        MI   Heart disease Father        MI   Arthritis Sister        crippling arthritis   Arthritis Brother        crippling arthritis   Social History   Socioeconomic History   Marital status: Widowed    Spouse name: Not on file   Number of children: 2   Years of education: Not on file   Highest education level: Not on file  Occupational History   Not on file  Tobacco Use   Smoking status: Every Day    Current packs/day: 0.50    Average packs/day: 1 pack/day  for 55.4 years (55.2 ttl pk-yrs)    Types: Cigarettes    Start date: 12/04/1966    Last attempt to quit: 12/03/2021   Smokeless tobacco: Never   Tobacco comments:    10 cigarettes a day khj 01/27/2023  Vaping Use   Vaping status: Some Days  Substance and Sexual Activity   Alcohol use: No    Alcohol/week: 0.0 standard drinks of alcohol   Drug use: No   Sexual activity: Not Currently  Other Topics Concern   Not on file  Social History Narrative   Lives alone   Social Drivers of Health   Financial Resource Strain: Low Risk  (04/21/2023)   Overall Financial Resource Strain (CARDIA)    Difficulty of Paying Living Expenses: Not hard at all  Food Insecurity: No Food Insecurity (04/21/2023)   Hunger Vital Sign    Worried About Running Out of Food in the Last Year: Never true    Ran Out of Food in the Last Year: Never true  Transportation Needs: No Transportation Needs (04/21/2023)   PRAPARE - Administrator, Civil Service (Medical): No    Lack of Transportation (Non-Medical): No  Physical Activity: Inactive (04/21/2023)   Exercise Vital Sign    Days of Exercise per Week: 0 days    Minutes of Exercise per Session: 0 min  Stress: No Stress Concern Present (04/21/2023)   [employer] of Occupational Health - Occupational Stress Questionnaire    Feeling of Stress : Not at all  Social Connections: Moderately Isolated (04/21/2023)    Social Connection and Isolation Panel [NHANES]    Frequency of Communication with Friends and Family: More than three times a week    Frequency of Social Gatherings with Friends and Family: More than three times a week    Attends Religious Services: More than 4 times per year    Active Member of [employer] or Organizations: No    Attends Banker Meetings: Never    Marital Status: Widowed    Tobacco Counseling Ready to quit: Not Answered Counseling given: Not Answered Tobacco comments: 10 cigarettes a day khj 01/27/2023   Clinical Intake:  Pre-visit preparation completed: No  Pain : 0-10 Pain Score: 7  Pain Type: Chronic pain Pain Location: Generalized Pain Descriptors / Indicators: Aching Pain Onset: More than a month ago Pain Frequency: Constant Pain Relieving Factors: medication,stretching exercises  Pain Relieving Factors: medication,stretching exercises  BMI - recorded: 32.12 Nutritional Status: BMI > 30  Obese Nutritional Risks: None Diabetes: No  How often do you need to have someone help you when you read instructions, pamphlets, or other written materials from your doctor or pharmacy?: 1 - Never  Interpreter Needed?: No  Comments: lives alone but daughter lives behind her Information entered by :: B.Levester Waldridge,LPN   Activities of Daily Living    04/21/2023    2:45 PM  In your present state of health, do you have any difficulty performing the following activities:  Hearing? 0  Vision? 0  Difficulty concentrating or making decisions? 0  Walking or climbing stairs? 1  Dressing or bathing? 0  Doing errands, shopping? 0  Preparing Food and eating ? N  Using the Toilet? N  In the past six months, have you accidently leaked urine? N  Do you have problems with loss of bowel control? N  Managing your Medications? N  Managing your Finances? N  Housekeeping or managing your Housekeeping? N    Patient Care Team: Tower, Audrie Gallus,  MD as PCP -  General Glory Buff, RN as Oncology Nurse Navigator Earna Coder, MD as Consulting Physician (Internal Medicine)  Indicate any recent Medical Services you may have received from other than [facility] providers in the past year (date may be approximate).     Assessment:   This is a routine wellness examination for [place].  Hearing/Vision screen Hearing Screening - Comments:: Pt says her hearing is fine Vision Screening - Comments:: Pt says uses readers only; vision is fine    Goals Addressed             This Visit's Progress    Patient Stated       04/21/23- I will continue to take medications as prescribed.      Patient Stated   Not on track    04/21/23- I will continue to walk everyday for about 20 minutes when weather permits.     Patient Stated   On track    04/21/23-Will continue to drink more water       Depression Screen    04/21/2023    2:40 PM 07/14/2022    2:14 PM 04/16/2022    1:35 PM 08/14/2021   10:29 AM 02/01/2021   10:35 AM 01/31/2020   10:30 AM 11/24/2018   11:59 AM  PHQ 2/9 Scores  PHQ - 2 Score 0 0 0 0 0 0 0  PHQ- 9 Score  0    0 0    Fall Risk    04/21/2023    2:36 PM 07/14/2022    2:14 PM 04/16/2022    1:32 PM 08/14/2021   10:28 AM 02/01/2021   10:34 AM  Fall Risk   Falls in the past year? 0 0 0 0 0  Number falls in past yr: 0 0 0 0 0  Injury with Fall? 0 0 0 0 0  Risk for fall due to : No Fall Risks No Fall Risks No Fall Risks No Fall Risks No Fall Risks  Follow up Education provided;Falls prevention discussed Falls evaluation completed Education provided;Falls prevention discussed Falls evaluation completed Falls prevention discussed    MEDICARE RISK AT HOME: Medicare Risk at Home Any stairs in or around the home?: No If so, are there any without handrails?: No Home free of loose throw rugs in walkways, pet beds, electrical cords, etc?: Yes Adequate lighting in your home to reduce risk of falls?: Yes Life alert?: No Use of a cane,  walker or w/c?: No Grab bars in the bathroom?: Yes Shower chair or bench in shower?: No Elevated toilet seat or a handicapped toilet?: Yes  TIMED UP AND GO:  Was the test performed?  No    Cognitive Function:    01/31/2020   10:33 AM 11/24/2018   12:02 PM 11/24/2017   10:32 AM 09/12/2016    1:20 PM  MMSE - Mini Mental State Exam  Orientation to time 5 5 5 5   Orientation to Place 5 5 5 5   Registration 3 3 3 3   Attention/ Calculation 5 0 0 0  Recall 3 3 3 3   Language- name 2 objects   0 0  Language- repeat 1 1 1 1   Language- follow 3 step command   3 3  Language- read & follow direction   0 0  Write a sentence   0 0  Copy design   0 0  Total score   20 20        04/21/2023    2:47  PM 04/16/2022    1:40 PM  6CIT Screen  What Year? 0 points 0 points  What month? 0 points 0 points  What time? 0 points 0 points  Count back from 20 0 points 0 points  Months in reverse 0 points 0 points  Repeat phrase 0 points 0 points  Total Score 0 points 0 points    Immunizations Immunization History  Administered Date(s) Administered   Fluad Quad(high Dose 65+) 11/30/2018, 02/07/2020   Influenza Split 01/22/2011, 03/24/2012   Influenza, High Dose Seasonal PF 11/07/2016   Influenza,inj,Quad PF,6+ Mos 11/30/2012, 12/06/2014, 11/22/2015, 11/24/2017   Pneumococcal Conjugate-13 01/02/2015   Pneumococcal Polysaccharide-23 01/22/2011, 09/12/2016   Td 05/01/1998, 03/04/2007    TDAP status: Up to date  Flu Vaccine status: Declined, Education has been provided regarding the importance of this vaccine but patient still declined. Advised may receive this vaccine at local pharmacy or Health Dept. Aware to provide a copy of the vaccination record if obtained from local pharmacy or Health Dept. Verbalized acceptance and understanding.  Pneumococcal vaccine status: Up to date  Covid-19 vaccine status: Declined, Education has been provided regarding the importance of this vaccine but patient  still declined. Advised may receive this vaccine at local pharmacy or Health Dept.or vaccine clinic. Aware to provide a copy of the vaccination record if obtained from local pharmacy or Health Dept. Verbalized acceptance and understanding.  Qualifies for Shingles Vaccine? Yes   Zostavax completed No   Shingrix Completed?: No.    Education has been provided regarding the importance of this vaccine. Patient has been advised to call insurance company to determine out of pocket expense if they have not yet received this vaccine. Advised may also receive vaccine at local pharmacy or Health Dept. Verbalized acceptance and understanding.  Screening Tests Health Maintenance  Topic Date Due   COVID-19 Vaccine (1) Never done   Zoster Vaccines- Shingrix (1 of 2) Never done   DTaP/Tdap/Td (3 - Tdap) 03/03/2017   MAMMOGRAM  12/03/2018   Fecal DNA (Cologuard)  12/02/2019   INFLUENZA VACCINE  06/01/2023 (Originally 10/02/2022)   Medicare Annual Wellness (AWV)  04/20/2024   Pneumonia Vaccine 12+ Years old  Completed   DEXA SCAN  Completed   Hepatitis C Screening  Completed   HPV VACCINES  Aged Out    Health Maintenance  Health Maintenance Due  Topic Date Due   COVID-19 Vaccine (1) Never done   Zoster Vaccines- Shingrix (1 of 2) Never done   DTaP/Tdap/Td (3 - Tdap) 03/03/2017   MAMMOGRAM  12/03/2018   Fecal DNA (Cologuard)  12/02/2019    Colorectal cancer screening: Type of screening: Cologuard. Completed 12/01/2016. Repeat every 3 years pt declines  Mammogram status: Completed 12/02/2017. Repeat every year pt declines  Bone Density status: Completed 06/12/2016. Results reflect: Bone density results: OSTEOPENIA. Repeat every 3-5 years.  Lung Cancer Screening: (Low Dose CT Chest recommended if Age 55-80 years, 20 pack-year currently smoking OR have quit w/in 15years.) does qualify.   Lung Cancer Screening Referral: already in place per pt  Additional Screening:  Hepatitis C Screening: does not  qualify; Completed 11/24/2017  Vision Screening: Recommended annual ophthalmology exams for early detection of glaucoma and other disorders of the eye. Is the patient up to date with their annual eye exam?  Yes  Who is the provider or what is the name of the office in which the patient attends annual eye exams? [facility] If pt is not established with a provider, would they  like to be referred to a provider to establish care? No .   Dental Screening: Recommended annual dental exams for proper oral hygiene  Diabetic Foot Exam: n/a  Community Resource Referral / Chronic Care Management: CRR required this visit?  No   CCM required this visit?  Appt scheduled with PCP PE appt    Plan:     I have personally reviewed and noted the following in the patient's chart:   Medical and social history Use of alcohol, tobacco or illicit drugs  Current medications and supplements including opioid prescriptions. Patient is not currently taking opioid prescriptions. Functional ability and status Nutritional status Physical activity Advanced directives List of other physicians Hospitalizations, surgeries, and ER visits in previous 12 months Vitals Screenings to include cognitive, depression, and falls Referrals and appointments  In addition, I have reviewed and discussed with patient certain preventive protocols, quality metrics, and best practice recommendations. A written personalized care plan for preventive services as well as general preventive health recommendations were provided to patient.    Sue Lush, LPN   9/60/[id number]   After Visit Summary: (MyChart) Due to this being a telephonic visit, the after visit summary with patients personalized plan was offered to patient via MyChart   Nurse Notes: Pt says she has no questions or concerns and is doing alright. She declines all care gaps offered today. I did get he to schedule her PE w/PCP.  Colorectal Cancer Screening: Patient  declined colorectal cancer screening  Mammogram:pt declined Vaccinations: declines: Influenza vaccine: recommend every Fall Shingles vaccine: recommend Shingrix which is 2 doses 2-6 months apart and over 90% effective     Covid-19: recommend 2 doses one month apart with a booster 6 months later

## 2023-04-29 ENCOUNTER — Ambulatory Visit: Payer: Medicare Other | Admitting: Pulmonary Disease

## 2023-04-30 ENCOUNTER — Other Ambulatory Visit: Payer: Self-pay | Admitting: Family Medicine

## 2023-04-30 NOTE — Telephone Encounter (Signed)
 Buspar last filled on 05/23/22 #90 tabs/ 2 refill Cymbalta last filled on 10/08/22 #90 caps/ 1 refill  CPE scheduled 05/21/23

## 2023-05-04 DIAGNOSIS — M353 Polymyalgia rheumatica: Secondary | ICD-10-CM | POA: Diagnosis not present

## 2023-05-04 DIAGNOSIS — Z6834 Body mass index (BMI) 34.0-34.9, adult: Secondary | ICD-10-CM | POA: Diagnosis not present

## 2023-05-04 DIAGNOSIS — M797 Fibromyalgia: Secondary | ICD-10-CM | POA: Diagnosis not present

## 2023-05-04 DIAGNOSIS — E669 Obesity, unspecified: Secondary | ICD-10-CM | POA: Diagnosis not present

## 2023-05-04 DIAGNOSIS — Z7952 Long term (current) use of systemic steroids: Secondary | ICD-10-CM | POA: Diagnosis not present

## 2023-05-04 DIAGNOSIS — M064 Inflammatory polyarthropathy: Secondary | ICD-10-CM | POA: Diagnosis not present

## 2023-05-04 DIAGNOSIS — M79642 Pain in left hand: Secondary | ICD-10-CM | POA: Diagnosis not present

## 2023-05-04 DIAGNOSIS — M79641 Pain in right hand: Secondary | ICD-10-CM | POA: Diagnosis not present

## 2023-05-07 ENCOUNTER — Ambulatory Visit: Admitting: Family Medicine

## 2023-05-07 ENCOUNTER — Encounter: Payer: Self-pay | Admitting: Family Medicine

## 2023-05-07 VITALS — BP 138/84 | HR 77 | Temp 98.2°F | Ht 65.0 in | Wt 204.1 lb

## 2023-05-07 DIAGNOSIS — R300 Dysuria: Secondary | ICD-10-CM

## 2023-05-07 LAB — POC URINALSYSI DIPSTICK (AUTOMATED)
Bilirubin, UA: NEGATIVE
Blood, UA: NEGATIVE
Glucose, UA: NEGATIVE
Ketones, UA: NEGATIVE
Nitrite, UA: NEGATIVE
Protein, UA: NEGATIVE
Spec Grav, UA: 1.01 (ref 1.010–1.025)
Urobilinogen, UA: 0.2 U/dL
pH, UA: 6 (ref 5.0–8.0)

## 2023-05-07 MED ORDER — CEPHALEXIN 500 MG PO CAPS: 500.0000 mg | ORAL_CAPSULE | Freq: Three times a day (TID) | ORAL | 0 refills | Status: DC

## 2023-05-07 NOTE — Progress Notes (Signed)
 Patient ID: [name] [name], female    DOB: 06-09-1950, 73 y.o.   MRN: [id number]  This visit was conducted in person.  BP 138/84   Pulse 77   Temp 98.2 F (36.8 C) (Oral)   Ht 5\' 5"  (1.651 m)   Wt 204 lb 2 oz (92.6 kg)   SpO2 96%   BMI 33.97 kg/m    CC:  Chief Complaint  Patient presents with   Dysuria    C/o burning when urinating and urinary frequency. Sxs started 05/04/22.    Subjective:   HPI: [name] [name] is a 73 y.o. 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 Sxs started 05/04/22.) Dysuria  This is a new problem. The current episode started in the past 7 days (3 days). The problem has been gradually worsening. The quality of the pain is described as burning. The pain is moderate. There has been no fever. Associated symptoms include frequency and urgency. Pertinent negatives include no chills, discharge, flank pain, hematuria, hesitancy, nausea or vomiting. She has tried increased fluids for the symptoms. The treatment provided mild relief. There is no history of catheterization, kidney stones, recurrent UTIs, a single kidney, urinary stasis or a urological procedure.    LAst UTI 3 years a go       Relevant past medical, surgical, family and social history reviewed and updated as indicated. Interim medical history since our last visit reviewed. Allergies and medications reviewed and updated. Outpatient Medications Prior to Visit  Medication Sig Dispense Refill   acetaminophen (TYLENOL) 500 MG tablet Take 500 mg by mouth every 6 (six) hours as needed.     aspirin 81 MG tablet Take 81 mg by mouth daily.     busPIRone (BUSPAR) 15 MG tablet TAKE 0.5 TABLETS (7.5 MG TOTAL) BY MOUTH TWO (TWO) TIMES DAILY. 90 tablet 0   Cobalamin Combinations (NEURIVA PLUS PO) Take by mouth.     docusate sodium (COLACE) 100 MG capsule Take 200 mg by mouth daily.     DULoxetine (CYMBALTA) 60 MG capsule TAKE ONE CAPSULE BY MOUTH  ONCE DAILY 90 capsule 0   hydrOXYzine (ATARAX) 10 MG tablet Take 1 tablet (10 mg total) by mouth 2 (two) times daily as needed for itching (May cause drowsiness). 90 tablet 1   lidocaine-prilocaine (EMLA) cream Apply on the port. 30 -45 min  prior to port access. 30 g 3   MAGNESIUM CITRATE PO Take by mouth daily.     Melatonin 10 MG CAPS Take 20 mg by mouth at bedtime.     naproxen sodium (ALEVE) 220 MG tablet Take 440 mg by mouth daily as needed (headaches).     nicotine (NICODERM CQ - DOSED IN MG/24 HOURS) 14 mg/24hr patch Place 1 patch (14 mg total) onto the skin daily. 28 patch 0   ondansetron (ZOFRAN) 8 MG tablet One pill every 8 hours as needed for nausea/vomitting. 40 tablet 1   predniSONE (DELTASONE) 20 MG tablet 2 tabs po daily for 5 days, then 1 tab po daily for 5 days 15 tablet 0   pregabalin (LYRICA) 75 MG capsule TAKE ONE CAPSULE BY MOUTH TWICE DAILY 180 capsule 1   prochlorperazine (COMPAZINE) 10 MG tablet Take 1 tablet (10 mg total) by mouth every 6 (six) hours as needed for nausea or vomiting. 40 tablet 1   senna (SENOKOT) 8.6 MG tablet Take 1 tablet (8.6 mg total) by mouth 2 (two) times  daily as needed for constipation. 14 tablet 0   Tiotropium Bromide-Olodaterol (STIOLTO RESPIMAT) 2.5-2.5 MCG/ACT AERS Inhale 2 puffs into the lungs daily. 4 g 11   traMADol (ULTRAM) 50 MG tablet Take 1 tablet (50 mg total) by mouth 3 (three) times daily as needed for severe pain (pain score 7-10). 90 tablet 0   Facility-Administered Medications Prior to Visit  Medication Dose Route Frequency Provider Last Rate Last Admin   0.9 %  sodium chloride infusion   Intravenous Continuous Rushie Chestnut, PA-C   Stopped at 02/17/22 1214   heparin lock flush 100 UNIT/ML injection            heparin lock flush 100 UNIT/ML injection            sodium chloride flush (NS) 0.9 % injection 10 mL  10 mL Intravenous Once Alinda Dooms, NP         Per HPI unless specifically indicated in ROS section  below Review of Systems  Constitutional:  Negative for chills.  Gastrointestinal:  Negative for nausea and vomiting.  Genitourinary:  Positive for dysuria, frequency and urgency. Negative for flank pain, hematuria and hesitancy.   Objective:  BP 138/84   Pulse 77   Temp 98.2 F (36.8 C) (Oral)   Ht 5\' 5"  (1.651 m)   Wt 204 lb 2 oz (92.6 kg)   SpO2 96%   BMI 33.97 kg/m   Wt Readings from Last 3 Encounters:  05/07/23 204 lb 2 oz (92.6 kg)  04/21/23 193 lb (87.5 kg)  01/27/23 193 lb (87.5 kg)      Physical Exam Constitutional:      General: She is not in acute distress.    Appearance: Normal appearance. She is well-developed. She is not ill-appearing or toxic-appearing.  HENT:     Head: Normocephalic.     Right Ear: Hearing, tympanic membrane, ear canal and external ear normal. Tympanic membrane is not erythematous, retracted or bulging.     Left Ear: Hearing, tympanic membrane, ear canal and external ear normal. Tympanic membrane is not erythematous, retracted or bulging.     Nose: No mucosal edema or rhinorrhea.     Right Sinus: No maxillary sinus tenderness or frontal sinus tenderness.     Left Sinus: No maxillary sinus tenderness or frontal sinus tenderness.     Mouth/Throat:     Pharynx: Uvula midline.  Eyes:     General: Lids are normal. Lids are everted, no foreign bodies appreciated.     Conjunctiva/sclera: Conjunctivae normal.     Pupils: Pupils are equal, round, and reactive to light.  Neck:     Thyroid: No thyroid mass or thyromegaly.     Vascular: No carotid bruit.     Trachea: Trachea normal.  Cardiovascular:     Rate and Rhythm: Normal rate and regular rhythm.     Pulses: Normal pulses.     Heart sounds: Normal heart sounds, S1 normal and S2 normal. No murmur heard.    No friction rub. No gallop.  Pulmonary:     Effort: Pulmonary effort is normal. No tachypnea or respiratory distress.     Breath sounds: Normal breath sounds. No decreased breath sounds,  wheezing, rhonchi or rales.  Abdominal:     General: Bowel sounds are normal.     Palpations: Abdomen is soft.     Tenderness: There is no abdominal tenderness.  Musculoskeletal:     Cervical back: Normal range of motion and neck supple.  Skin:    General: Skin is warm and dry.     Findings: No rash.  Neurological:     Mental Status: She is alert.  Psychiatric:        Mood and Affect: Mood is not anxious or depressed.        Speech: Speech normal.        Behavior: Behavior normal. Behavior is cooperative.        Thought Content: Thought content normal.        Judgment: Judgment normal.       Results for orders placed or performed in visit on 05/07/23  POCT Urinalysis Dipstick (Automated)   Collection Time: 05/07/23  3:45 PM  Result Value Ref Range   Color, UA yellow    Clarity, UA clear    Glucose, UA Negative Negative   Bilirubin, UA negative    Ketones, UA negative    Spec Grav, UA 1.010 1.010 - 1.025   Blood, UA negative    pH, UA 6.0 5.0 - 8.0   Protein, UA Negative Negative   Urobilinogen, UA 0.2 0.2 or 1.0 E.U./dL   Nitrite, UA negative    Leukocytes, UA Small (1+) (A) Negative  Urine Culture   Collection Time: 05/07/23  4:15 PM   Specimen: Urine  Result Value Ref Range   MICRO NUMBER: [id number]    SPECIMEN QUALITY: Adequate    Sample Source URINE    STATUS: FINAL    ISOLATE 1: Klebsiella pneumoniae (A)       Susceptibility   Klebsiella pneumoniae - URINE CULTURE, REFLEX    AMOX/CLAVULANIC <=2 Sensitive     AMPICILLIN 16 Resistant     AMPICILLIN/SULBACTAM 4 Sensitive     CEFAZOLIN* <=4 Not Reportable      * For infections other than uncomplicated UTI caused by E. coli, K. pneumoniae or P. mirabilis: Cefazolin is resistant if MIC > or = 8 mcg/mL. (Distinguishing susceptible versus intermediate for isolates with MIC < or = 4 mcg/mL requires additional testing.) For uncomplicated UTI caused by E. coli, K. pneumoniae or P. mirabilis: Cefazolin  is susceptible if MIC <32 mcg/mL and predicts susceptible to the oral agents cefaclor, cefdinir, cefpodoxime, cefprozil, cefuroxime, cephalexin and loracarbef.     CEFTAZIDIME <=1 Sensitive     CEFEPIME <=1 Sensitive     CEFTRIAXONE <=1 Sensitive     CIPROFLOXACIN <=0.25 Sensitive     LEVOFLOXACIN <=0.12 Sensitive     GENTAMICIN <=1 Sensitive     IMIPENEM <=0.25 Sensitive     NITROFURANTOIN 32 Sensitive     PIP/TAZO <=4 Sensitive     TOBRAMYCIN <=1 Sensitive     TRIMETH/SULFA* <=20 Sensitive      * For infections other than uncomplicated UTI caused by E. coli, K. pneumoniae or P. mirabilis: Cefazolin is resistant if MIC > or = 8 mcg/mL. (Distinguishing susceptible versus intermediate for isolates with MIC < or = 4 mcg/mL requires additional testing.) For uncomplicated UTI caused by E. coli, K. pneumoniae or P. mirabilis: Cefazolin is susceptible if MIC <32 mcg/mL and predicts susceptible to the oral agents cefaclor, cefdinir, cefpodoxime, cefprozil, cefuroxime, cephalexin and loracarbef. Legend: S = Susceptible  I = Intermediate R = Resistant  NS = Not susceptible SDD = Susceptible Dose Dependent * = Not Tested  NR = Not Reported **NN = See Therapy Comments     Assessment and Plan  Dysuria Assessment & Plan: Acute, likely secondary to urinary tract infection given abnormal urinalysis in office today.  Will send urine for culture. Patient will increase water intake and start cephalexin 500 mg 3 times daily x 7 days.  Return and ER precautions provided  Orders: -     POCT Urinalysis Dipstick (Automated) -     Urine Culture  Other orders -     Cephalexin; Take 1 capsule (500 mg total) by mouth 3 (three) times daily.  Dispense: 21 capsule; Refill: 0    No follow-ups on file.   Kerby Nora, MD

## 2023-05-09 LAB — URINE CULTURE
MICRO NUMBER:: 16168329
SPECIMEN QUALITY:: ADEQUATE

## 2023-05-11 ENCOUNTER — Encounter: Payer: Self-pay | Admitting: Family Medicine

## 2023-05-18 NOTE — Assessment & Plan Note (Signed)
 Acute, likely secondary to urinary tract infection given abnormal urinalysis in office today. Will send urine for culture. Patient will increase water intake and start cephalexin 500 mg 3 times daily x 7 days.  Return and ER precautions provided

## 2023-05-19 ENCOUNTER — Encounter: Payer: Self-pay | Admitting: Internal Medicine

## 2023-05-20 ENCOUNTER — Ambulatory Visit
Admission: RE | Admit: 2023-05-20 | Discharge: 2023-05-20 | Disposition: A | Discharge status: Home / Self Care | Payer: Medicare Other | Source: Ambulatory Visit | Attending: Internal Medicine | Admitting: Internal Medicine

## 2023-05-20 DIAGNOSIS — I700 Atherosclerosis of aorta: Secondary | ICD-10-CM | POA: Diagnosis not present

## 2023-05-20 DIAGNOSIS — C3490 Malignant neoplasm of unspecified part of unspecified bronchus or lung: Secondary | ICD-10-CM | POA: Diagnosis not present

## 2023-05-20 DIAGNOSIS — C3412 Malignant neoplasm of upper lobe, left bronchus or lung: Secondary | ICD-10-CM | POA: Insufficient documentation

## 2023-05-20 MED ORDER — IOHEXOL 300 MG/ML  SOLN
75.0000 mL | Freq: Once | INTRAMUSCULAR | Status: AC | PRN
  Administered 2023-05-20: 75 mL via INTRAVENOUS

## 2023-05-21 ENCOUNTER — Encounter: Payer: Self-pay | Admitting: Family Medicine

## 2023-05-21 ENCOUNTER — Ambulatory Visit (INDEPENDENT_AMBULATORY_CARE_PROVIDER_SITE_OTHER): Payer: Medicare HMO | Admitting: Family Medicine

## 2023-05-21 VITALS — BP 134/82 | HR 77 | Temp 98.3°F | Ht 64.5 in | Wt 196.5 lb

## 2023-05-21 DIAGNOSIS — M797 Fibromyalgia: Secondary | ICD-10-CM | POA: Diagnosis not present

## 2023-05-21 DIAGNOSIS — F4321 Adjustment disorder with depressed mood: Secondary | ICD-10-CM

## 2023-05-21 DIAGNOSIS — Z720 Tobacco use: Secondary | ICD-10-CM

## 2023-05-21 DIAGNOSIS — E785 Hyperlipidemia, unspecified: Secondary | ICD-10-CM | POA: Diagnosis not present

## 2023-05-21 DIAGNOSIS — D649 Anemia, unspecified: Secondary | ICD-10-CM | POA: Diagnosis not present

## 2023-05-21 DIAGNOSIS — Z0000 Encounter for general adult medical examination without abnormal findings: Secondary | ICD-10-CM

## 2023-05-21 DIAGNOSIS — J449 Chronic obstructive pulmonary disease, unspecified: Secondary | ICD-10-CM

## 2023-05-21 DIAGNOSIS — F4320 Adjustment disorder, unspecified: Secondary | ICD-10-CM

## 2023-05-21 DIAGNOSIS — R7309 Other abnormal glucose: Secondary | ICD-10-CM

## 2023-05-21 DIAGNOSIS — M8589 Other specified disorders of bone density and structure, multiple sites: Secondary | ICD-10-CM | POA: Diagnosis not present

## 2023-05-21 DIAGNOSIS — M159 Polyosteoarthritis, unspecified: Secondary | ICD-10-CM

## 2023-05-21 DIAGNOSIS — Z1211 Encounter for screening for malignant neoplasm of colon: Secondary | ICD-10-CM

## 2023-05-21 DIAGNOSIS — R7989 Other specified abnormal findings of blood chemistry: Secondary | ICD-10-CM | POA: Diagnosis not present

## 2023-05-21 LAB — CBC WITH DIFFERENTIAL/PLATELET
Basophils Absolute: 0.1 10*3/uL (ref 0.0–0.1)
Basophils Relative: 0.6 % (ref 0.0–3.0)
Eosinophils Absolute: 0.5 10*3/uL (ref 0.0–0.7)
Eosinophils Relative: 5.5 % — ABNORMAL HIGH (ref 0.0–5.0)
HCT: 40.8 % (ref 36.0–46.0)
Hemoglobin: 13.4 g/dL (ref 12.0–15.0)
Lymphocytes Relative: 19 % (ref 12.0–46.0)
Lymphs Abs: 1.8 10*3/uL (ref 0.7–4.0)
MCHC: 32.9 g/dL (ref 30.0–36.0)
MCV: 85.8 fl (ref 78.0–100.0)
Monocytes Absolute: 0.5 10*3/uL (ref 0.1–1.0)
Monocytes Relative: 5.1 % (ref 3.0–12.0)
Neutro Abs: 6.6 10*3/uL (ref 1.4–7.7)
Neutrophils Relative %: 69.8 % (ref 43.0–77.0)
Platelets: 284 10*3/uL (ref 150.0–400.0)
RBC: 4.76 Mil/uL (ref 3.87–5.11)
RDW: 15.5 % (ref 11.5–15.5)
WBC: 9.5 10*3/uL (ref 4.0–10.5)

## 2023-05-21 LAB — COMPREHENSIVE METABOLIC PANEL
ALT: 10 U/L (ref 0–35)
AST: 12 U/L (ref 0–37)
Albumin: 4 g/dL (ref 3.5–5.2)
Alkaline Phosphatase: 68 U/L (ref 39–117)
BUN: 16 mg/dL (ref 6–23)
CO2: 30 meq/L (ref 19–32)
Calcium: 9.9 mg/dL (ref 8.4–10.5)
Chloride: 100 meq/L (ref 96–112)
Creatinine, Ser: 0.82 mg/dL (ref 0.40–1.20)
GFR: 70.64 mL/min (ref >60.00)
Glucose, Bld: 125 mg/dL — ABNORMAL HIGH (ref 70–99)
Potassium: 3.9 meq/L (ref 3.5–5.1)
Sodium: 137 meq/L (ref 135–145)
Total Bilirubin: 0.5 mg/dL (ref 0.2–1.2)
Total Protein: 6.9 g/dL (ref 6.0–8.3)

## 2023-05-21 LAB — HEMOGLOBIN A1C: Hgb A1c MFr Bld: 5.8 % (ref 4.6–6.5)

## 2023-05-21 LAB — LIPID PANEL
Cholesterol: 228 mg/dL — ABNORMAL HIGH (ref 0–200)
HDL: 89.9 mg/dL (ref >39.00)
LDL Cholesterol: 121 mg/dL — ABNORMAL HIGH (ref 0–99)
NonHDL: 138.37
Total CHOL/HDL Ratio: 3
Triglycerides: 86 mg/dL (ref 0.0–149.0)
VLDL: 17.2 mg/dL (ref 0.0–40.0)

## 2023-05-21 LAB — IRON: Iron: 82 ug/dL (ref 42–145)

## 2023-05-21 MED ORDER — ROSUVASTATIN CALCIUM 5 MG PO TABS: 5.0000 mg | ORAL_TABLET | Freq: Every day | ORAL | 3 refills | Status: DC

## 2023-05-21 NOTE — Assessment & Plan Note (Signed)
 A1c ordered  disc imp of low glycemic diet and wt loss to prevent DM2

## 2023-05-21 NOTE — Patient Instructions (Addendum)
 For bone health Try to get 1200-1500 mg of calcium per day with at least 2000 iu of vitamin D - for bone health  If calcium constipates you then just take the vitamin D   Try and walk regularly  Think about indoor options  Add some strength training to your routine, this is important for bone and brain health and can reduce your risk of falls and help your body use insulin properly and regulate weight  Light weights, exercise bands , and internet videos are a good way to start  Yoga (chair or regular), machines , floor exercises or a gym with machines are also good options    Water exercise is best for folks with arthritis and fibromyalgia   Labs today   Take care of yourself    Try to get most of your carbohydrates from produce (with the exception of white potatoes) and whole grains Eat less bread/pasta/rice/snack foods/cereals/sweets and other items from the middle of the grocery store (processed carbs)

## 2023-05-21 NOTE — Assessment & Plan Note (Signed)
 In pt with history of lung cancer  Improved in fall Cbc and iron levels ordered today

## 2023-05-21 NOTE — Assessment & Plan Note (Signed)
 5-6 cig per week  Pt does not feel ready to quit  Has been prescribed nicotine patches in past  History of copd and also lung cancer -personal  Disc in detail risks of smoking and possible outcomes including copd, vascular/ heart disease, cancer , respiratory and sinus infections as well as osteoporosis  Pt voices understanding

## 2023-05-21 NOTE — Assessment & Plan Note (Signed)
 Sees rheumatology for this and OA   Continues Lyrica 75 mg bid Cymblata 60 mg daily   Tramadol and aleve prn

## 2023-05-21 NOTE — Assessment & Plan Note (Signed)
 Dexa 2018  Declines further testing or any treatment Stressed importance of ca and D for bone health and handout given  Discussed fall prevention, supplements and exercise for bone density  No falls or fractures   Aware smoking increases risk of osteoporosis

## 2023-05-21 NOTE — Assessment & Plan Note (Signed)
 Per pt doing better  No mt inhalers at all -just albuterol for rescue  Smokes 5-6 cig per week and not ready to quit yet  Under pulm care  Has used nicotine patch in past

## 2023-05-21 NOTE — Addendum Note (Signed)
 Addended by: Roxy Manns A on: 05/21/2023 05:11 PM   Modules accepted: Orders

## 2023-05-21 NOTE — Assessment & Plan Note (Signed)
 Spending time with grandchild has helped mood

## 2023-05-21 NOTE — Assessment & Plan Note (Signed)
 Cologuard neg 12/2016  Declines any further screening at this time

## 2023-05-21 NOTE — Progress Notes (Signed)
 Subjective:    Patient ID: [name] [name], female    DOB: January 12, 1951, 73 y.o.   MRN: [id number]  HPI  Here for health maintenance exam and to review chronic medical problems   Wt Readings from Last 3 Encounters:  05/21/23 196 lb 8 oz (89.1 kg)  05/07/23 204 lb 2 oz (92.6 kg)  04/21/23 193 lb (87.5 kg)   33.21 kg/m  Vitals:   05/21/23 1012  BP: 134/82  Pulse: 77  Temp: 98.3 F (36.8 C)  SpO2: 97%    Immunization History  Administered Date(s) Administered   Fluad Quad(high Dose 65+) 11/30/2018, 02/07/2020   Influenza Split 01/22/2011, 03/24/2012   Influenza, High Dose Seasonal PF 11/07/2016   Influenza,inj,Quad PF,6+ Mos 11/30/2012, 12/06/2014, 11/22/2015, 11/24/2017   Pneumococcal Conjugate-13 01/02/2015   Pneumococcal Polysaccharide-23 01/22/2011, 09/12/2016   Td 05/01/1998, 03/04/2007    There are no preventive care reminders to display for this patient.  Loves time with her 76 y old grandchild   Recently treated for uti - keflex  Feeling better !    Declines flu shot Declines shingrix Tetanus shot -declines  Covid shot declines    Mammogram declines  Self breast exam- no lumps   Has a mole under r breast-occational itches   Gyn health No problems    Colon cancer screening - declines further screening  Cologuard neg 12/2016   Bone health  Dexa  06/2016 -declines further eval or treatment  Osteopenia   Falls- none Fractures-none  Supplements  - magnesium / no ca or D    Exercise  Walks when the weather is good      Mood    05/07/2023    3:37 PM 04/21/2023    2:40 PM 07/14/2022    2:14 PM 04/16/2022    1:35 PM 08/14/2021   10:29 AM  Depression screen PHQ 2/9  Decreased Interest 0 0 0 0 0  Down, Depressed, Hopeless 0 0 0 0 0  PHQ - 2 Score 0 0 0 0 0  Altered sleeping   0    Tired, decreased energy   0    Change in appetite   0    Feeling bad or failure about yourself    0    Trouble concentrating   0    Moving slowly or  fidgety/restless   0    Suicidal thoughts   0    PHQ-9 Score   0    Difficult doing work/chores   Not difficult at all        05/07/2023    3:37 PM 08/14/2021   10:29 AM  GAD 7 : Generalized Anxiety Score  Nervous, Anxious, on Edge 0 0  Control/stop worrying 0 0  Worry too much - different things 0 0  Trouble relaxing 0 0  Restless 0 0  Easily annoyed or irritable 0 0  Afraid - awful might happen 0 0  Total GAD 7 Score 0 0  Anxiety Difficulty  Not difficult at all     Buspar 7.5 mg bid   Fibromyalgia and also arthritis  Hurts all the time  Sees rheumatology and working on that  Lyrica 75 mg bid  Cymbalta 60 mg daily  Tramadol 50 mg tid prn   History of copd and lung cancer in past  Had lobectomy  Overall doing pretty good  No mt inhalers/just rescue (stopppedher stiolto respimat)  Sees pulmonary   Smoking status  - every now and then has a  cigarette  5-6 per week  Eventually wants to quit     Glucose Lab Results  Component Value Date   HGBA1C 5.5 06/17/2021   HGBA1C 5.4 01/31/2020   Lab Results  Component Value Date   TSH 3.380 11/20/2022   Hyperlipidemia Lab Results  Component Value Date   CHOL 186 06/17/2021   HDL 87.70 06/17/2021   LDLCALC 85 06/17/2021   LDLDIRECT 127.3 01/22/2011   TRIG 64.0 06/17/2021   CHOLHDL 2 06/17/2021    Lab Results  Component Value Date   WBC 9.1 11/20/2022   HGB 11.2 (L) 11/20/2022   HCT 35.3 (L) 11/20/2022   MCV 84.9 11/20/2022   PLT 287 11/20/2022  No results found for: "IRON", "TIBC", "FERRITIN"  Appetite is ok  Tries to eat healthy   Is careful about sugar  No fast food or fried food      Patient Active Problem List   Diagnosis Date Noted   Mild anemia 05/21/2023   Shortness of breath 08/04/2022   Thoracic aortic aneurysm (HCC) 06/11/2022   Stage 1 mild COPD by GOLD classification (HCC) 06/03/2022   Cancer of upper lobe of left lung (HCC) 01/03/2022   S/P lobectomy of lung 12/12/2021   Lung  cancer (HCC) 12/05/2021   Acute pain of right shoulder 08/15/2021   Elevated glucose level 01/02/2019   Elevated TSH 11/30/2018   Grief reaction 11/24/2017   Multiple joint pain 09/16/2016   Osteopenia 06/15/2016   Screening mammogram, encounter for 04/28/2016   Estrogen deficiency 04/28/2016   Medicare annual wellness visit, initial 01/02/2015   Colon cancer screening 05/31/2013   Retinopathy, bilateral 10/05/2012   Hyperlipidemia, mild 10/05/2012   Adverse effects of medication 03/24/2012   Routine general medical examination at a health care facility 01/22/2011   Generalized osteoarthritis of hand 07/06/2009   SEBACEOUS CYST, NECK 07/13/2007   SLEEP DISORDER 01/18/2007   POSTHERPETIC NEURALGIA 12/15/2006   Tobacco abuse 12/15/2006   CARPAL TUNNEL SYNDROME 12/15/2006   Osteoarthritis 12/15/2006   Fibromyalgia 12/15/2006   Past Medical History:  Diagnosis Date   Arthritis    OA   Carpal tunnel syndrome    Depression    Endometriosis    Fibromyalgia    Hyperlipidemia    Left upper lobe pulmonary nodule 11/2021   Post herpetic neuralgia    Sleep disorder    Tobacco abuse    Past Surgical History:  Procedure Laterality Date   ABDOMINAL HYSTERECTOMY  1992   total endometriosis   APPENDECTOMY  1992   bladder tack  1992   CATARACT EXTRACTION W/PHACO Right 03/25/2018   Procedure: CATARACT EXTRACTION PHACO AND INTRAOCULAR LENS PLACEMENT (IOC) RIGHT;  Surgeon: Elliot Cousin, MD;  Location: [facility] ORS;  Service: Ophthalmology;  Laterality: Right;  [place]  01:11 CDE 8.02 Fluid pack lot # [id number]   CATARACT EXTRACTION W/PHACO Left 04/22/2018   Procedure: CATARACT EXTRACTION PHACO AND INTRAOCULAR LENS PLACEMENT (IOC) Left Eye;  Surgeon: Elliot Cousin, MD;  Location: [facility] OR[facility];  Service: Ophthalmology;  Laterality: Left;  [place]  01:02 CDE 8.11 Fluid pack lot # [id number]   COLONOSCOPY     FOOT SURGERY Right 04/2016   [facility] in [place] great toe; straightening others    INTERCOSTAL NERVE BLOCK Left 12/12/2021   Procedure: INTERCOSTAL NERVE BLOCK;  Surgeon: Loreli Slot, MD;  Location: [facility] OR;  Service: Thoracic;  Laterality: Left;   IR IMAGING GUIDED PORT INSERTION  02/27/2022   IR REMOVAL TUN ACCESS W/ PORT W/O  FL MOD SED  11/27/2022   LYMPH NODE DISSECTION Left 12/12/2021   Procedure: LYMPH NODE DISSECTION;  Surgeon: Loreli Slot, MD;  Location: [facility] OR;  Service: Thoracic;  Laterality: Left;   VIDEO BRONCHOSCOPY N/A 12/16/2021   Procedure: VIDEO BRONCHOSCOPY;  Surgeon: Loreli Slot, MD;  Location: [facility] OR;  Service: Thoracic;  Laterality: N/A;   Social History   Tobacco Use   Smoking status: Some Days    Current packs/day: 0.50    Average packs/day: 1 pack/day for 55.5 years (55.2 ttl pk-yrs)    Types: Cigarettes    Start date: 12/04/1966    Last attempt to quit: 12/03/2021   Smokeless tobacco: Never   Tobacco comments:    10 cigarettes a day khj 01/27/2023  Vaping Use   Vaping status: Some Days  Substance Use Topics   Alcohol use: No    Alcohol/week: 0.0 standard drinks of alcohol   Drug use: No   Family History  Problem Relation Age of Onset   Heart disease Mother        MI   Heart disease Father        MI   Arthritis Sister        crippling arthritis   Arthritis Brother        crippling arthritis   No Known Allergies Current Outpatient Medications on File Prior to Visit  Medication Sig Dispense Refill   acetaminophen (TYLENOL) 500 MG tablet Take 500 mg by mouth every 6 (six) hours as needed.     aspirin 81 MG tablet Take 81 mg by mouth daily.     busPIRone (BUSPAR) 15 MG tablet TAKE 0.5 TABLETS (7.5 MG TOTAL) BY MOUTH TWO (TWO) TIMES DAILY. 90 tablet 0   cephALEXin (KEFLEX) 500 MG capsule Take 1 capsule (500 mg total) by mouth 3 (three) times daily. 21 capsule 0   Cobalamin Combinations (NEURIVA PLUS PO) Take by mouth.     docusate sodium (COLACE) 100 MG capsule Take 200 mg by mouth daily.     DULoxetine  (CYMBALTA) 60 MG capsule TAKE ONE CAPSULE BY MOUTH ONCE DAILY 90 capsule 0   hydrOXYzine (ATARAX) 10 MG tablet Take 1 tablet (10 mg total) by mouth 2 (two) times daily as needed for itching (May cause drowsiness). 90 tablet 1   lidocaine-prilocaine (EMLA) cream Apply on the port. 30 -45 min  prior to port access. 30 g 3   MAGNESIUM CITRATE PO Take by mouth daily.     Melatonin 10 MG CAPS Take 20 mg by mouth at bedtime.     naproxen sodium (ALEVE) 220 MG tablet Take 440 mg by mouth daily as needed (headaches).     nicotine (NICODERM CQ - DOSED IN MG/24 HOURS) 14 mg/24hr patch Place 1 patch (14 mg total) onto the skin daily. 28 patch 0   ondansetron (ZOFRAN) 8 MG tablet One pill every 8 hours as needed for nausea/vomitting. 40 tablet 1   pregabalin (LYRICA) 75 MG capsule TAKE ONE CAPSULE BY MOUTH TWICE DAILY 180 capsule 1   prochlorperazine (COMPAZINE) 10 MG tablet Take 1 tablet (10 mg total) by mouth every 6 (six) hours as needed for nausea or vomiting. 40 tablet 1   senna (SENOKOT) 8.6 MG tablet Take 1 tablet (8.6 mg total) by mouth 2 (two) times daily as needed for constipation. 14 tablet 0   traMADol (ULTRAM) 50 MG tablet Take 1 tablet (50 mg total) by mouth 3 (three) times daily as needed for severe  pain (pain score 7-10). 90 tablet 0   Current Facility-Administered Medications on File Prior to Visit  Medication Dose Route Frequency Provider Last Rate Last Admin   0.9 %  sodium chloride infusion   Intravenous Continuous Rushie Chestnut, PA-C   Stopped at 02/17/22 1214   heparin lock flush 100 UNIT/ML injection            heparin lock flush 100 UNIT/ML injection            sodium chloride flush (NS) 0.9 % injection 10 mL  10 mL Intravenous Once Alinda Dooms, NP        Review of Systems  Constitutional:  Positive for fatigue. Negative for activity change, appetite change, fever and unexpected weight change.  HENT:  Negative for congestion, ear pain, rhinorrhea, sinus pressure and sore  throat.   Eyes:  Negative for pain, redness and visual disturbance.  Respiratory:  Negative for cough, shortness of breath and wheezing.   Cardiovascular:  Negative for chest pain and palpitations.  Gastrointestinal:  Negative for abdominal pain, blood in stool, constipation and diarrhea.  Endocrine: Negative for polydipsia and polyuria.  Genitourinary:  Negative for dysuria, frequency and urgency.  Musculoskeletal:  Positive for arthralgias. Negative for back pain and myalgias.  Skin:  Negative for pallor and rash.  Allergic/Immunologic: Negative for environmental allergies.  Neurological:  Negative for dizziness, syncope and headaches.  Hematological:  Negative for adenopathy. Does not bruise/bleed easily.  Psychiatric/Behavioral:  Negative for decreased concentration and dysphoric mood. The patient is not nervous/anxious.        Objective:   Physical Exam Constitutional:      General: She is not in acute distress.    Appearance: Normal appearance. She is well-developed. She is obese. She is not ill-appearing or diaphoretic.  HENT:     Head: Normocephalic and atraumatic.     Right Ear: Tympanic membrane, ear canal and external ear normal.     Left Ear: Tympanic membrane, ear canal and external ear normal.     Nose: Nose normal. No congestion.     Mouth/Throat:     Mouth: Mucous membranes are moist.     Pharynx: Oropharynx is clear. No posterior oropharyngeal erythema.  Eyes:     General: No scleral icterus.    Extraocular Movements: Extraocular movements intact.     Conjunctiva/sclera: Conjunctivae normal.     Pupils: Pupils are equal, round, and reactive to light.  Neck:     Thyroid: No thyromegaly.     Vascular: No carotid bruit or JVD.  Cardiovascular:     Rate and Rhythm: Normal rate and regular rhythm.     Pulses: Normal pulses.     Heart sounds: Normal heart sounds.     No gallop.  Pulmonary:     Effort: Pulmonary effort is normal. No respiratory distress.      Breath sounds: Normal breath sounds. No wheezing.     Comments: Good air exch Chest:     Chest wall: No tenderness.  Abdominal:     General: Bowel sounds are normal. There is no distension or abdominal bruit.     Palpations: Abdomen is soft. There is no mass.     Tenderness: There is no abdominal tenderness.     Hernia: No hernia is present.  Genitourinary:    Comments: Breast exam: No mass, nodules, thickening, tenderness, bulging, retraction, inflamation, nipple discharge or skin changes noted.  No axillary or clavicular LA.     Musculoskeletal:  General: No tenderness. Normal range of motion.     Cervical back: Normal range of motion and neck supple. No rigidity. No muscular tenderness.     Right lower leg: No edema.     Left lower leg: No edema.     Comments: Mild kyphosis   Diffuse myofascial tenderness in trunk and limbs   Lymphadenopathy:     Cervical: No cervical adenopathy.  Skin:    General: Skin is warm and dry.     Coloration: Skin is not pale.     Findings: No erythema or rash.     Comments: Solar lentigines diffusely Scattered sks   Neurological:     Mental Status: She is alert. Mental status is at baseline.     Cranial Nerves: No cranial nerve deficit.     Motor: No abnormal muscle tone.     Coordination: Coordination normal.     Gait: Gait normal.     Deep Tendon Reflexes: Reflexes are normal and symmetric. Reflexes normal.  Psychiatric:        Mood and Affect: Mood normal.        Cognition and Memory: Cognition and memory normal.           Assessment & Plan:   Problem List Items Addressed This Visit       Respiratory   Stage 1 mild COPD by GOLD classification (HCC)   Per pt doing better  No mt inhalers at all -just albuterol for rescue  Smokes 5-6 cig per week and not ready to quit yet  Under pulm care  Has used nicotine patch in past        Musculoskeletal and Integument   Osteopenia   Dexa 2018  Declines further testing or any  treatment Stressed importance of ca and D for bone health and handout given  Discussed fall prevention, supplements and exercise for bone density  No falls or fractures   Aware smoking increases risk of osteoporosis       Osteoarthritis   Continues rheumatology care         Other   Tobacco abuse   5-6 cig per week  Pt does not feel ready to quit  Has been prescribed nicotine patches in past  History of copd and also lung cancer -personal  Disc in detail risks of smoking and possible outcomes including copd, vascular/ heart disease, cancer , respiratory and sinus infections as well as osteoporosis  Pt voices understanding       Routine general medical examination at a health care facility - Primary   Reviewed health habits including diet and exercise and skin cancer prevention Reviewed appropriate screening tests for age  Also reviewed health mt list, fam hx and immunization status , as well as social and family history   See HPI Labs reviewed and ordered Health Maintenance  Topic Date Due   Cologuard (Stool DNA test)  12/02/2019   Flu Shot  06/01/2023*   Zoster (Shingles) Vaccine (1 of 2) 08/21/2023*   DTaP/Tdap/Td vaccine (3 - Tdap) 05/20/2024*   Mammogram  05/20/2024*   COVID-19 Vaccine (1) 06/05/2024*   Medicare Annual Wellness Visit  04/20/2024   Pneumonia Vaccine  Completed   DEXA scan (bone density measurement)  Completed   Hepatitis C Screening  Completed   HPV Vaccine  Aged Out  *Topic was postponed. The date shown is not the original due date.   Declines all imms incl tetanus  Declines mammo / breast cancer screening Declines colon  cancer screen  Declines dexa or treatment of osteopenia  Discussed fall prevention, supplements and exercise for bone density  PHQ and GAD7 scores 0        Mild anemia   In pt with history of lung cancer  Improved in fall Cbc and iron levels ordered today      Relevant Orders   CBC with Differential/Platelet   Iron    Hyperlipidemia, mild   Disc goals for lipids and reasons to control them Rev last labs with pt Rev low sat fat diet in detail Labs today  Has known aortic aneurysm -sees vascular       Relevant Orders   Comprehensive metabolic panel   Lipid Panel   Grief reaction   Spending time with grandchild has helped mood      Fibromyalgia   Sees rheumatology for this and OA   Continues Lyrica 75 mg bid Cymblata 60 mg daily   Tramadol and aleve prn        Elevated TSH   Lab Results  Component Value Date   TSH 3.380 11/20/2022   No clinical changes       Elevated glucose level   A1c ordered  disc imp of low glycemic diet and wt loss to prevent DM2       Relevant Orders   Comprehensive metabolic panel   Hemoglobin A1c   Colon cancer screening   Cologuard neg 12/2016  Declines any further screening at this time

## 2023-05-21 NOTE — Assessment & Plan Note (Addendum)
 Reviewed health habits including diet and exercise and skin cancer prevention Reviewed appropriate screening tests for age  Also reviewed health mt list, fam hx and immunization status , as well as social and family history   See HPI Labs reviewed and ordered Health Maintenance  Topic Date Due   Cologuard (Stool DNA test)  12/02/2019   Flu Shot  06/01/2023*   Zoster (Shingles) Vaccine (1 of 2) 08/21/2023*   DTaP/Tdap/Td vaccine (3 - Tdap) 05/20/2024*   Mammogram  05/20/2024*   COVID-19 Vaccine (1) 06/05/2024*   Medicare Annual Wellness Visit  04/20/2024   Pneumonia Vaccine  Completed   DEXA scan (bone density measurement)  Completed   Hepatitis C Screening  Completed   HPV Vaccine  Aged Out  *Topic was postponed. The date shown is not the original due date.   Declines all imms incl tetanus  Declines mammo / breast cancer screening Declines colon cancer screen  Declines dexa or treatment of osteopenia  Discussed fall prevention, supplements and exercise for bone density  PHQ and GAD7 scores 0

## 2023-05-21 NOTE — Assessment & Plan Note (Addendum)
 Disc goals for lipids and reasons to control them Rev last labs with pt Rev low sat fat diet in detail Labs today  Has known aortic aneurysm -sees vascular   Addendum Cholesterol is up significantly  Lab Results  Component Value Date   LDLCALC 121 (H) 05/21/2023   Will try crestor 5 mg daily and re check 6 wk

## 2023-05-21 NOTE — Assessment & Plan Note (Signed)
 Lab Results  Component Value Date   TSH 3.380 11/20/2022   No clinical changes

## 2023-05-21 NOTE — Assessment & Plan Note (Signed)
 Continues rheumatology care

## 2023-05-27 ENCOUNTER — Inpatient Hospital Stay: Payer: Medicare Other | Admitting: Internal Medicine

## 2023-05-27 ENCOUNTER — Encounter: Payer: Self-pay | Admitting: Internal Medicine

## 2023-05-27 ENCOUNTER — Inpatient Hospital Stay: Payer: Medicare Other | Attending: Internal Medicine

## 2023-05-27 VITALS — BP 126/73 | HR 87 | Temp 98.4°F | Resp 16 | Ht 64.5 in | Wt 196.0 lb

## 2023-05-27 DIAGNOSIS — Z7952 Long term (current) use of systemic steroids: Secondary | ICD-10-CM | POA: Diagnosis not present

## 2023-05-27 DIAGNOSIS — Z85118 Personal history of other malignant neoplasm of bronchus and lung: Secondary | ICD-10-CM | POA: Insufficient documentation

## 2023-05-27 DIAGNOSIS — M797 Fibromyalgia: Secondary | ICD-10-CM | POA: Diagnosis not present

## 2023-05-27 DIAGNOSIS — Z79899 Other long term (current) drug therapy: Secondary | ICD-10-CM | POA: Insufficient documentation

## 2023-05-27 DIAGNOSIS — C3412 Malignant neoplasm of upper lobe, left bronchus or lung: Secondary | ICD-10-CM | POA: Diagnosis not present

## 2023-05-27 DIAGNOSIS — G629 Polyneuropathy, unspecified: Secondary | ICD-10-CM | POA: Diagnosis not present

## 2023-05-27 DIAGNOSIS — Z9221 Personal history of antineoplastic chemotherapy: Secondary | ICD-10-CM | POA: Insufficient documentation

## 2023-05-27 DIAGNOSIS — F17210 Nicotine dependence, cigarettes, uncomplicated: Secondary | ICD-10-CM | POA: Insufficient documentation

## 2023-05-27 LAB — CBC WITH DIFFERENTIAL (CANCER CENTER ONLY)
Abs Immature Granulocytes: 0.08 10*3/uL — ABNORMAL HIGH (ref 0.00–0.07)
Basophils Absolute: 0.1 10*3/uL (ref 0.0–0.1)
Basophils Relative: 1 %
Eosinophils Absolute: 0.6 10*3/uL — ABNORMAL HIGH (ref 0.0–0.5)
Eosinophils Relative: 7 %
HCT: 39.9 % (ref 36.0–46.0)
Hemoglobin: 13.1 g/dL (ref 12.0–15.0)
Immature Granulocytes: 1 %
Lymphocytes Relative: 33 %
Lymphs Abs: 2.7 10*3/uL (ref 0.7–4.0)
MCH: 28.1 pg (ref 26.0–34.0)
MCHC: 32.8 g/dL (ref 30.0–36.0)
MCV: 85.6 fL (ref 80.0–100.0)
Monocytes Absolute: 0.6 10*3/uL (ref 0.1–1.0)
Monocytes Relative: 7 %
Neutro Abs: 4.2 10*3/uL (ref 1.7–7.7)
Neutrophils Relative %: 51 %
Platelet Count: 290 10*3/uL (ref 150–400)
RBC: 4.66 MIL/uL (ref 3.87–5.11)
RDW: 15 % (ref 11.5–15.5)
WBC Count: 8.2 10*3/uL (ref 4.0–10.5)
nRBC: 0 % (ref 0.0–0.2)

## 2023-05-27 LAB — CMP (CANCER CENTER ONLY)
ALT: 12 U/L (ref 0–44)
AST: 19 U/L (ref 15–41)
Albumin: 3.8 g/dL (ref 3.5–5.0)
Alkaline Phosphatase: 64 U/L (ref 38–126)
Anion gap: 12 (ref 5–15)
BUN: 22 mg/dL (ref 8–23)
CO2: 21 mmol/L — ABNORMAL LOW (ref 22–32)
Calcium: 9.7 mg/dL (ref 8.9–10.3)
Chloride: 101 mmol/L (ref 98–111)
Creatinine: 0.87 mg/dL (ref 0.44–1.00)
GFR, Estimated: >60 mL/min (ref >60)
Glucose, Bld: 195 mg/dL — ABNORMAL HIGH (ref 70–99)
Potassium: 3.9 mmol/L (ref 3.5–5.1)
Sodium: 134 mmol/L — ABNORMAL LOW (ref 135–145)
Total Bilirubin: 0.6 mg/dL (ref 0.0–1.2)
Total Protein: 6.9 g/dL (ref 6.5–8.1)

## 2023-05-27 NOTE — Progress Notes (Signed)
 [name] CONSULT NOTE  Patient Care Team: Tower, Audrie Gallus, MD as PCP - General Glory Buff, RN as Oncology Nurse Navigator Earna Coder, MD as Consulting Physician (Internal Medicine)  CHIEF COMPLAINTS/PURPOSE OF CONSULTATION: lung cancer  #  Oncology History Overview Note  JULY 2023- 1. 10 mm mean diameter nodule in the left upper lobe adjacent to an area of pleural and parenchymal scarring.     SEP 2023- 1. Hypermetabolic spiculated nodular left upper lobe pulmonary lesion is compatible with primary bronchogenic neoplasm. 2. Mildly metabolic nodularity in the left hilum, without discrete adenopathy may reflect early nodal disease involvement. Suggest attention on follow-up imaging. 3. No evidence of distant hypermetabolic metastatic disease. 4. Hypermetabolic nodule in the left lobe of the thyroid, Recommend thyroid US and biopsy (ref: J Am Coll Radiol. 2015 Feb;12(2): 143-50). 5. Aortic Atherosclerosis (ICD10-I70.0) and Emphysema (ICD10-J43.9).  # LUNG: Resection  Synchronous Tumors: Not applicable Total Number of Primary Tumors: 1 Procedure: Lobectomy, lung Specimen Laterality: Left Tumor Focality: Unifocal Tumor Site: Upper lobe Tumor Size: 2.6 cm Histologic Type: Squamous cell carcinoma, moderately differentiated Visceral Pleura Invasion: Not identified Direct Invasion of Adjacent Structures: No adjacent structures present Lymphovascular Invasion: Not identified Margins: All margins negative for invasive carcinoma      Closest Margin(s) to Invasive Carcinoma: Bronchovascular margin      Margin(s) Involved by Invasive Carcinoma: Not applicable       Margin Status for Non-Invasive Tumor: Negative Treatment Effect: No known presurgical therapy Regional Lymph Nodes:      Number of Lymph Nodes Involved: 1                           Nodal Sites with Tumor: Level 10      Number of Lymph Nodes Examined: 21                      Nodal Sites  Examined: Levels 4, 5, 7, 9, 10, 11, 12 and 13 Distant Metastasis:      Distant Site(s) Involved: Not applicable Pathologic Stage Classification (pTNM, AJCC 8th Edition): pT1c, pN1 Ancillary Studies: Can be performed upon request Representative Tumor Block: B5 Comment(s): None  # DEC 14th, 2023- Cisplatin-Taxotere #1; D-2 Greggory Keen x4 cycles [finished FEB 2024]  # April 15th, 2024- Keytruda q 3 W  # s/p adjuvant therapy-Keytruda cycle #7/17- discontinued sec to side effects- severe joint pain/myalgias.    Lung cancer (HCC)  12/05/2021 Initial Diagnosis   Lung cancer (HCC)   12/18/2021 Cancer Staging   Staging form: Lung, AJCC 8th Edition - Pathologic stage from 12/18/2021: Stage IIB (pT1c, pN1, cM0) - Signed by Loreli Slot, MD on 12/18/2021 Histopathologic type: Squamous cell carcinoma, NOS   Cancer of upper lobe of left lung (HCC)  01/03/2022 Initial Diagnosis   Cancer of upper lobe of left lung (HCC)   01/03/2022 Cancer Staging   Staging form: Lung, AJCC 8th Edition - Pathologic: Stage IIB (pT1c, pN1, cM0) - Signed by Earna Coder, MD on 01/03/2022   02/13/2022 - 04/18/2022 Chemotherapy   Patient is on Treatment Plan : LUNG Cisplatin + Docetaxel q21d     06/16/2022 - 09/30/2022 Chemotherapy   Patient is on Treatment Plan : LUNG NSCLC Pembrolizumab (200) q21d      HISTORY OF PRESENTING ILLNESS: Ambulating independently.  Alone.   [name] 73 y.o.  female history of smoking with stage II T2 N1 squamous cell  carcinoma left lung cancer currently on surveillance discontinued adjuvant  immunotherapy/keytruda because of poor tolerance/ and review the results of CT chest.   Patient s/p evaluation with rheumatology.  Currently on prednisone /Cymbalta and also Lyrica for the body aches.  Unfortunately patient has not noted any significant improvement.  She is unable to taper the steroids.  Noted to have weight gain.  Denies any worsening skin rash. Bowels are  normal. Appetite is good. Sleeps well. Mild neuropathy in feet.    Review of Systems  Constitutional:  Negative for chills, diaphoresis, fever, malaise/fatigue and weight loss.  HENT:  Negative for nosebleeds and sore throat.   Eyes:  Negative for double vision.  Respiratory:  Negative for cough, hemoptysis, sputum production, shortness of breath and wheezing.   Cardiovascular:  Negative for chest pain, palpitations, orthopnea and leg swelling.  Gastrointestinal:  Negative for abdominal pain, blood in stool, constipation, diarrhea, heartburn, melena, nausea and vomiting.  Genitourinary:  Negative for dysuria, frequency and urgency.  Musculoskeletal:  Positive for myalgias and neck pain. Negative for back pain and joint pain.  Skin: Negative.  Negative for itching and rash.  Neurological:  Positive for tingling. Negative for dizziness, focal weakness, weakness and headaches.  Endo/Heme/Allergies:  Does not bruise/bleed easily.  Psychiatric/Behavioral:  Negative for depression. The patient is not nervous/anxious and does not have insomnia.      MEDICAL HISTORY:  Past Medical History:  Diagnosis Date   Arthritis    OA   Carpal tunnel syndrome    Depression    Endometriosis    Fibromyalgia    Hyperlipidemia    Left upper lobe pulmonary nodule 11/2021   Post herpetic neuralgia    Sleep disorder    Tobacco abuse     SURGICAL HISTORY: Past Surgical History:  Procedure Laterality Date   ABDOMINAL HYSTERECTOMY  1992   total endometriosis   APPENDECTOMY  1992   bladder tack  1992   CATARACT EXTRACTION W/PHACO Right 03/25/2018   Procedure: CATARACT EXTRACTION PHACO AND INTRAOCULAR LENS PLACEMENT (IOC) RIGHT;  Surgeon: Elliot Cousin, MD;  Location: [facility];  Service: Ophthalmology;  Laterality: Right;  [place]  01:11 CDE 8.02 Fluid pack lot # [id number]   CATARACT EXTRACTION W/PHACO Left 04/22/2018   Procedure: CATARACT EXTRACTION PHACO AND INTRAOCULAR LENS PLACEMENT (IOC) Left Eye;   Surgeon: Elliot Cousin, MD;  Location: [facility];  Service: Ophthalmology;  Laterality: Left;  [place]  01:02 CDE 8.11 Fluid pack lot # [id number]   COLONOSCOPY     FOOT SURGERY Right 04/2016   [name] [place] great toe; straightening others   INTERCOSTAL NERVE BLOCK Left 12/12/2021   Procedure: INTERCOSTAL NERVE BLOCK;  Surgeon: Loreli Slot, MD;  Location: [name];  Service: Thoracic;  Laterality: Left;   IR IMAGING GUIDED PORT INSERTION  02/27/2022   IR REMOVAL TUN ACCESS W/ PORT W/O FL MOD SED  11/27/2022   LYMPH NODE DISSECTION Left 12/12/2021   Procedure: LYMPH NODE DISSECTION;  Surgeon: Loreli Slot, MD;  Location: [facility] OR;  Service: Thoracic;  Laterality: Left;   VIDEO BRONCHOSCOPY N/A 12/16/2021   Procedure: VIDEO BRONCHOSCOPY;  Surgeon: Loreli Slot, MD;  Location: [facility] OR;  Service: Thoracic;  Laterality: N/A;    SOCIAL HISTORY: Social History   Socioeconomic History   Marital status: Widowed    Spouse name: Not on file   Number of children: 2   Years of education: Not on file   Highest education level: Not on  file  Occupational History   Not on file  Tobacco Use   Smoking status: Some Days    Current packs/day: 0.50    Average packs/day: 1 pack/day for 55.5 years (55.2 ttl pk-yrs)    Types: Cigarettes    Start date: 12/04/1966    Last attempt to quit: 12/03/2021   Smokeless tobacco: Never   Tobacco comments:    10 cigarettes a day khj 01/27/2023  Vaping Use   Vaping status: Some Days  Substance and Sexual Activity   Alcohol use: No    Alcohol/week: 0.0 standard drinks of alcohol   Drug use: No   Sexual activity: Not Currently  Other Topics Concern   Not on file  Social History Narrative   Lives alone   Social Drivers of Health   Financial Resource Strain: Low Risk  (04/21/2023)   Overall Financial Resource Strain (CARDIA)    Difficulty of Paying Living Expenses: Not hard at all  Food Insecurity: No Food Insecurity (04/21/2023)    Hunger Vital Sign    Worried About Running Out of Food in the Last Year: Never true    Ran Out of Food in the Last Year: Never true  Transportation Needs: No Transportation Needs (04/21/2023)   PRAPARE - Administrator, Civil Service (Medical): No    Lack of Transportation (Non-Medical): No  Physical Activity: Inactive (04/21/2023)   Exercise Vital Sign    Days of Exercise per Week: 0 days    Minutes of Exercise per Session: 0 min  Stress: No Stress Concern Present (04/21/2023)   [employer] of Occupational Health - Occupational Stress Questionnaire    Feeling of Stress : Not at all  Social Connections: Moderately Isolated (04/21/2023)   Social Connection and Isolation Panel [NHANES]    Frequency of Communication with Friends and Family: More than three times a week    Frequency of Social Gatherings with Friends and Family: More than three times a week    Attends Religious Services: More than 4 times per year    Active Member of [employer] or Organizations: No    Attends Banker Meetings: Never    Marital Status: Widowed  Intimate Partner Violence: Not At Risk (04/21/2023)   Humiliation, Afraid, Rape, and Kick questionnaire    Fear of Current or Ex-Partner: No    Emotionally Abused: No    Physically Abused: No    Sexually Abused: No    FAMILY HISTORY: Family History  Problem Relation Age of Onset   Heart disease Mother        MI   Heart disease Father        MI   Arthritis Sister        crippling arthritis   Arthritis Brother        crippling arthritis    ALLERGIES:  has no known allergies.  MEDICATIONS:  Current Outpatient Medications  Medication Sig Dispense Refill   acetaminophen (TYLENOL) 500 MG tablet Take 500 mg by mouth every 6 (six) hours as needed.     busPIRone (BUSPAR) 15 MG tablet TAKE 0.5 TABLETS (7.5 MG TOTAL) BY MOUTH TWO (TWO) TIMES DAILY. 90 tablet 0   cephALEXin (KEFLEX) 500 MG capsule Take 1 capsule (500 mg total) by mouth 3  (three) times daily. 21 capsule 0   DULoxetine (CYMBALTA) 60 MG capsule TAKE ONE CAPSULE BY MOUTH ONCE DAILY 90 capsule 0   hydrOXYzine (ATARAX) 10 MG tablet Take 1 tablet (10 mg total) by mouth  2 (two) times daily as needed for itching (May cause drowsiness). 90 tablet 1   MAGNESIUM CITRATE PO Take by mouth daily.     Melatonin 10 MG CAPS Take 20 mg by mouth at bedtime.     naproxen sodium (ALEVE) 220 MG tablet Take 440 mg by mouth daily as needed (headaches).     nicotine (NICODERM CQ - DOSED IN MG/24 HOURS) 14 mg/24hr patch Place 1 patch (14 mg total) onto the skin daily. 28 patch 0   ondansetron (ZOFRAN) 8 MG tablet One pill every 8 hours as needed for nausea/vomitting. 40 tablet 1   pregabalin (LYRICA) 75 MG capsule TAKE ONE CAPSULE BY MOUTH TWICE DAILY 180 capsule 1   prochlorperazine (COMPAZINE) 10 MG tablet Take 1 tablet (10 mg total) by mouth every 6 (six) hours as needed for nausea or vomiting. 40 tablet 1   traMADol (ULTRAM) 50 MG tablet Take 1 tablet (50 mg total) by mouth 3 (three) times daily as needed for severe pain (pain score 7-10). 90 tablet 0   docusate sodium (COLACE) 100 MG capsule Take 200 mg by mouth daily. (Patient not taking: Reported on 05/27/2023)     senna (SENOKOT) 8.6 MG tablet Take 1 tablet (8.6 mg total) by mouth 2 (two) times daily as needed for constipation. (Patient not taking: Reported on 05/27/2023) 14 tablet 0   No current facility-administered medications for this visit.   Facility-Administered Medications Ordered in Other Visits  Medication Dose Route Frequency Provider Last Rate Last Admin   0.9 %  sodium chloride infusion   Intravenous Continuous Rushie Chestnut, PA-C   Stopped at 02/17/22 1214   heparin lock flush 100 UNIT/ML injection            heparin lock flush 100 UNIT/ML injection            sodium chloride flush (NS) 0.9 % injection 10 mL  10 mL Intravenous Once Alinda Dooms, NP          .  PHYSICAL EXAMINATION: ECOG PERFORMANCE  STATUS: 0 - Asymptomatic  Vitals:   05/27/23 0923  BP: 126/73  Pulse: 87  Resp: 16  Temp: 98.4 F (36.9 C)  SpO2: 97%    Filed Weights   05/27/23 0923  Weight: 196 lb (88.9 kg)     Physical Exam Vitals and nursing note reviewed.  Constitutional:      Comments:     HENT:     Head: Normocephalic and atraumatic.     Mouth/Throat:     Mouth: Mucous membranes are moist.     Pharynx: No oropharyngeal exudate.  Eyes:     Pupils: Pupils are equal, round, and reactive to light.  Cardiovascular:     Rate and Rhythm: Normal rate and regular rhythm.  Pulmonary:     Effort: No respiratory distress.     Breath sounds: No wheezing.     Comments: Decreased breath sounds bilaterally at bases.  No wheeze or crackles Abdominal:     General: Bowel sounds are normal. There is no distension.     Palpations: Abdomen is soft. There is no mass.     Tenderness: There is no abdominal tenderness. There is no guarding or rebound.  Musculoskeletal:        General: No tenderness. Normal range of motion.     Cervical back: Normal range of motion and neck supple.  Skin:    General: Skin is warm.  Neurological:     Mental Status:  She is alert and oriented to person, place, and time.  Psychiatric:        Mood and Affect: Affect normal.        Judgment: Judgment normal.      LABORATORY DATA:  I have reviewed the data as listed Lab Results  Component Value Date   WBC 8.2 05/27/2023   HGB 13.1 05/27/2023   HCT 39.9 05/27/2023   MCV 85.6 05/27/2023   PLT 290 05/27/2023   Recent Labs    10/21/22 0838 11/20/22 0919 05/21/23 1055 05/27/23 0915  NA 133* 136 137 134*  K 4.2 4.2 3.9 3.9  CL 101 101 100 101  CO2 24 26 30  21*  GLUCOSE 174* 112* 125* 195*  BUN 13 18 16 22   CREATININE 0.76 0.69 0.82 0.87  CALCIUM 9.3 9.9 9.9 9.7  GFRNONAA >60 >60  --  >60  PROT 7.0 6.8 6.9 6.9  ALBUMIN 3.2* 3.3* 4.0 3.8  AST 17 15 12 19   ALT 13 14 10 12   ALKPHOS 83 80 68 64  BILITOT 0.2* 0.5 0.5  0.6    RADIOGRAPHIC STUDIES: I have personally reviewed the radiological images as listed and agreed with the findings in the report. No results found.  ASSESSMENT & PLAN:   Cancer of upper lobe of left lung (HCC) # 2023-JULY  Left upper lobe-lung cancer moderately differentiated squamous cell carcinoma-pT1pN1- [IIB]; PDL-1 =70%. EGFR status-WILD.  Adjuvant chemotherapy  s/p 4 cycles of Cis-Taxotere-CT scan; DISCONTINUED KEYTRUDA after #7 of planned 17 [discontinued sec to side effects- severe joint pain/myalgias]   # 11/19/2022- CT Chest - No evidence of lung cancer recurrence;  No adenopathy. Postsurgical change in the LEFT upper lobe.  # CT scan: MARCH 7th- 2025- results pending- but no evidence of any recurrence based on  my review.  Await final radiology report.  For now we will plan to repeat a CT scan in 6 months.  # Worsening fibromyalgia- currently prednisone -? dose; lyrica; cymbalta;  [Dr.Hawkes[place]]- defer to Rheumatology  # PN G-1 ; from Taxotere- stable.  # # IV Access: port Explantation-   # Vaccination: Flu shot- recommended.   pt requesting AM appts-    Will call pt re: CT scan-  # DISPOSITION: # follow up in 6  month- MD;  labs- cbc/cmp;Thyroid profile CT chest prior-[facility]- Dr.B  # I reviewed the blood work- with the patient in detail; also reviewed the imaging independently [as summarized above]; and with the patient in detail.    All questions were answered. The patient knows to call the clinic with any problems, questions or concerns.    Earna Coder, MD 05/27/2023 10:40 AM

## 2023-05-27 NOTE — Assessment & Plan Note (Addendum)
#   2023-JULY  Left upper lobe-lung cancer moderately differentiated squamous cell carcinoma-pT1pN1- [IIB]; PDL-1 =70%. EGFR status-WILD.  Adjuvant chemotherapy  s/p 4 cycles of Cis-Taxotere-CT scan; DISCONTINUED KEYTRUDA after #7 of planned 17 [discontinued sec to side effects- severe joint pain/myalgias]   # 11/19/2022- CT Chest - No evidence of lung cancer recurrence;  No adenopathy. Postsurgical change in the LEFT upper lobe.  # CT scan: MARCH 7th- 2025- results pending- but no evidence of any recurrence based on  my review.  Await final radiology report.  For now we will plan to repeat a CT scan in 6 months.  # Worsening fibromyalgia- currently prednisone -? dose; lyrica; cymbalta;  [Dr.Hawkes[place]]- defer to Rheumatology  # PN G-1 ; from Taxotere- stable.  # # IV Access: port Explantation-   # Vaccination: Flu shot- recommended.   pt requesting AM appts-    Will call pt re: CT scan-  # DISPOSITION: # follow up in 6  month- MD;  labs- cbc/cmp;Thyroid profile CT chest prior-[facility]- Dr.B  # I reviewed the blood work- with the patient in detail; also reviewed the imaging independently [as summarized above]; and with the patient in detail.

## 2023-05-27 NOTE — Progress Notes (Signed)
 CT chest 05/20/23, called for read.

## 2023-05-28 LAB — THYROID PANEL WITH TSH
Free Thyroxine Index: 1.6 (ref 1.2–4.9)
T3 Uptake Ratio: 25 % (ref 24–39)
T4, Total: 6.2 ug/dL (ref 4.5–12.0)
TSH: 8.59 u[IU]/mL — ABNORMAL HIGH (ref 0.450–4.500)

## 2023-06-01 ENCOUNTER — Encounter: Payer: Self-pay | Admitting: Internal Medicine

## 2023-06-04 ENCOUNTER — Telehealth: Payer: Self-pay | Admitting: *Deleted

## 2023-06-04 NOTE — Telephone Encounter (Signed)
 The scan is not been read, radiology that reads the scan are not enough staff to read them . It is about 16 to 18 days to  get the results. I will send message to team results are not resulted and I send message that it takes some time now because of shortage of radiology   and let you know.

## 2023-06-05 ENCOUNTER — Encounter: Payer: Self-pay | Admitting: Internal Medicine

## 2023-06-05 NOTE — Telephone Encounter (Signed)
 I spoke to patient regarding results of CT scan-no concern for any recurrent cancer we will continue follow-up imaging in 6 months.  However noted to have calcification/atherosclerosis-defer to Dr. Milinda Antis for further recommendations.  # TSH elevated however normal T3-T4-would not typically treat with Synthroid unless patient is symptomatic.  Defer to Dr. Milinda Antis for further recommendations.  Follow up as planned-  Dr. Milinda Antis- [place]-

## 2023-06-08 ENCOUNTER — Telehealth: Payer: Self-pay | Admitting: *Deleted

## 2023-06-08 NOTE — Telephone Encounter (Signed)
 Pt notified of Dr. Royden Purl comments. Pt had an appt scheduled already in May so will just get labs checked then.

## 2023-06-08 NOTE — Telephone Encounter (Signed)
 Yes , TSH is elevated but other thyroid tests are in the normal range  You may become hypothyroid in the future - but we would ordinarily not treat with thyroid supplementation unless the T4 is low or if you become very symptomatic (fatigue [name] and skin changes/ depression)    Let's re check TSH and FT4 in about 2 months to track it

## 2023-06-08 NOTE — Telephone Encounter (Signed)
 Aware  Will monitor  Sees Dr Wyn Quaker for aorta

## 2023-06-08 NOTE — Telephone Encounter (Signed)
 Copied from CRM [id number]. Topic: Clinical - Lab/Test Results >> Jun 08, 2023 12:24 PM Florestine Avers wrote: Reason for CRM: Patient states she saw her Dr. Tania Ade ago, that her thyroid was tested and he was not worried about it. She said when she went on mychart she saw it was two times higher than it should be, she wants to know could that make her thyroid count go higher and should she be alarmed about her thyroid.

## 2023-06-08 NOTE — Telephone Encounter (Signed)
 Looks like pt had recent thyroid labs at oncologist office

## 2023-06-20 ENCOUNTER — Other Ambulatory Visit: Payer: Self-pay | Admitting: Family Medicine

## 2023-06-22 NOTE — Telephone Encounter (Signed)
 Name of Medication: Tramadol  Name of Pharmacy: [facility] or Written Date and Quantity: 04/07/23 #90 tabs/ 0 refills Last Office Visit and Type: CPE 05/21/23 Next Office Visit and Type: none scheduled

## 2023-07-03 ENCOUNTER — Other Ambulatory Visit

## 2023-07-06 DIAGNOSIS — Z7952 Long term (current) use of systemic steroids: Secondary | ICD-10-CM | POA: Diagnosis not present

## 2023-07-06 DIAGNOSIS — M353 Polymyalgia rheumatica: Secondary | ICD-10-CM | POA: Diagnosis not present

## 2023-07-06 DIAGNOSIS — Z6832 Body mass index (BMI) 32.0-32.9, adult: Secondary | ICD-10-CM | POA: Diagnosis not present

## 2023-07-06 DIAGNOSIS — M797 Fibromyalgia: Secondary | ICD-10-CM | POA: Diagnosis not present

## 2023-07-06 DIAGNOSIS — M1991 Primary osteoarthritis, unspecified site: Secondary | ICD-10-CM | POA: Diagnosis not present

## 2023-07-06 DIAGNOSIS — M064 Inflammatory polyarthropathy: Secondary | ICD-10-CM | POA: Diagnosis not present

## 2023-07-06 DIAGNOSIS — E669 Obesity, unspecified: Secondary | ICD-10-CM | POA: Diagnosis not present

## 2023-07-29 ENCOUNTER — Other Ambulatory Visit: Payer: Self-pay | Admitting: Family Medicine

## 2023-07-31 NOTE — Telephone Encounter (Signed)
 Both filled on 04/30/23 #90 tabs 0 refills CPE was on 05/21/23

## 2023-09-02 ENCOUNTER — Other Ambulatory Visit: Payer: Self-pay | Admitting: Family Medicine

## 2023-09-03 NOTE — Telephone Encounter (Signed)
 Name of Medication: Tramadol  Name of [facility] or Written Date and Quantity: 06/22/23 #90 tabs/ 0 refills Last Office Visit and Type: CPE 05/21/23 Next Office Visit and Type: none scheduled

## 2023-09-14 DIAGNOSIS — M064 Inflammatory polyarthropathy: Secondary | ICD-10-CM | POA: Diagnosis not present

## 2023-09-14 DIAGNOSIS — Z6833 Body mass index (BMI) 33.0-33.9, adult: Secondary | ICD-10-CM | POA: Diagnosis not present

## 2023-09-14 DIAGNOSIS — M353 Polymyalgia rheumatica: Secondary | ICD-10-CM | POA: Diagnosis not present

## 2023-09-14 DIAGNOSIS — M1991 Primary osteoarthritis, unspecified site: Secondary | ICD-10-CM | POA: Diagnosis not present

## 2023-09-14 DIAGNOSIS — E669 Obesity, unspecified: Secondary | ICD-10-CM | POA: Diagnosis not present

## 2023-09-14 DIAGNOSIS — M797 Fibromyalgia: Secondary | ICD-10-CM | POA: Diagnosis not present

## 2023-09-14 DIAGNOSIS — Z7952 Long term (current) use of systemic steroids: Secondary | ICD-10-CM | POA: Diagnosis not present

## 2023-10-26 ENCOUNTER — Other Ambulatory Visit: Payer: Self-pay | Admitting: Family Medicine

## 2023-10-27 NOTE — Telephone Encounter (Signed)
 Name of Medication: Tramadol  Name of [facility] or Written Date and Quantity: 09/03/23 #90 tabs/ 0 refills Last Office Visit and Type: CPE 05/21/23 Next Office Visit and Type: none scheduled

## 2023-11-05 ENCOUNTER — Encounter: Payer: Self-pay | Admitting: Internal Medicine

## 2023-11-09 ENCOUNTER — Ambulatory Visit
Admission: RE | Admit: 2023-11-09 | Discharge: 2023-11-09 | Disposition: A | Discharge status: Home / Self Care | Source: Ambulatory Visit | Attending: Internal Medicine | Admitting: Internal Medicine

## 2023-11-09 DIAGNOSIS — C3490 Malignant neoplasm of unspecified part of unspecified bronchus or lung: Secondary | ICD-10-CM | POA: Diagnosis not present

## 2023-11-09 DIAGNOSIS — C3412 Malignant neoplasm of upper lobe, left bronchus or lung: Secondary | ICD-10-CM

## 2023-11-09 MED ORDER — IOPAMIDOL (ISOVUE-300) INJECTION 61%
75.0000 mL | Freq: Once | INTRAVENOUS | Status: AC | PRN
  Administered 2023-11-09: 75 mL via INTRAVENOUS

## 2023-11-16 DIAGNOSIS — M353 Polymyalgia rheumatica: Secondary | ICD-10-CM | POA: Diagnosis not present

## 2023-11-16 DIAGNOSIS — Z6833 Body mass index (BMI) 33.0-33.9, adult: Secondary | ICD-10-CM | POA: Diagnosis not present

## 2023-11-16 DIAGNOSIS — E669 Obesity, unspecified: Secondary | ICD-10-CM | POA: Diagnosis not present

## 2023-11-16 DIAGNOSIS — Z7952 Long term (current) use of systemic steroids: Secondary | ICD-10-CM | POA: Diagnosis not present

## 2023-11-16 DIAGNOSIS — M064 Inflammatory polyarthropathy: Secondary | ICD-10-CM | POA: Diagnosis not present

## 2023-11-16 DIAGNOSIS — M797 Fibromyalgia: Secondary | ICD-10-CM | POA: Diagnosis not present

## 2023-11-16 DIAGNOSIS — M1991 Primary osteoarthritis, unspecified site: Secondary | ICD-10-CM | POA: Diagnosis not present

## 2023-11-25 DIAGNOSIS — D2272 Melanocytic nevi of left lower limb, including hip: Secondary | ICD-10-CM | POA: Diagnosis not present

## 2023-11-25 DIAGNOSIS — L821 Other seborrheic keratosis: Secondary | ICD-10-CM | POA: Diagnosis not present

## 2023-11-25 DIAGNOSIS — L538 Other specified erythematous conditions: Secondary | ICD-10-CM | POA: Diagnosis not present

## 2023-11-25 DIAGNOSIS — D2271 Melanocytic nevi of right lower limb, including hip: Secondary | ICD-10-CM | POA: Diagnosis not present

## 2023-11-25 DIAGNOSIS — D225 Melanocytic nevi of trunk: Secondary | ICD-10-CM | POA: Diagnosis not present

## 2023-11-25 DIAGNOSIS — L820 Inflamed seborrheic keratosis: Secondary | ICD-10-CM | POA: Diagnosis not present

## 2023-11-25 DIAGNOSIS — R208 Other disturbances of skin sensation: Secondary | ICD-10-CM | POA: Diagnosis not present

## 2023-11-25 DIAGNOSIS — D2262 Melanocytic nevi of left upper limb, including shoulder: Secondary | ICD-10-CM | POA: Diagnosis not present

## 2023-11-25 DIAGNOSIS — D2261 Melanocytic nevi of right upper limb, including shoulder: Secondary | ICD-10-CM | POA: Diagnosis not present

## 2023-11-27 ENCOUNTER — Inpatient Hospital Stay

## 2023-11-27 ENCOUNTER — Inpatient Hospital Stay: Attending: Internal Medicine | Admitting: Internal Medicine

## 2023-11-27 ENCOUNTER — Encounter: Payer: Self-pay | Admitting: Internal Medicine

## 2023-11-27 VITALS — BP 137/80 | HR 76 | Temp 96.5°F | Resp 16 | Ht 64.5 in | Wt 197.1 lb

## 2023-11-27 DIAGNOSIS — C3412 Malignant neoplasm of upper lobe, left bronchus or lung: Secondary | ICD-10-CM

## 2023-11-27 DIAGNOSIS — T451X5D Adverse effect of antineoplastic and immunosuppressive drugs, subsequent encounter: Secondary | ICD-10-CM | POA: Diagnosis not present

## 2023-11-27 DIAGNOSIS — M797 Fibromyalgia: Secondary | ICD-10-CM | POA: Diagnosis not present

## 2023-11-27 DIAGNOSIS — G620 Drug-induced polyneuropathy: Secondary | ICD-10-CM | POA: Diagnosis not present

## 2023-11-27 DIAGNOSIS — Z7952 Long term (current) use of systemic steroids: Secondary | ICD-10-CM | POA: Insufficient documentation

## 2023-11-27 DIAGNOSIS — F17210 Nicotine dependence, cigarettes, uncomplicated: Secondary | ICD-10-CM | POA: Diagnosis not present

## 2023-11-27 DIAGNOSIS — R946 Abnormal results of thyroid function studies: Secondary | ICD-10-CM | POA: Diagnosis not present

## 2023-11-27 DIAGNOSIS — Z9221 Personal history of antineoplastic chemotherapy: Secondary | ICD-10-CM | POA: Diagnosis not present

## 2023-11-27 DIAGNOSIS — Z79899 Other long term (current) drug therapy: Secondary | ICD-10-CM | POA: Insufficient documentation

## 2023-11-27 LAB — CMP (CANCER CENTER ONLY)
ALT: 16 U/L (ref 0–44)
AST: 19 U/L (ref 15–41)
Albumin: 3.8 g/dL (ref 3.5–5.0)
Alkaline Phosphatase: 83 U/L (ref 38–126)
Anion gap: 8 (ref 5–15)
BUN: 11 mg/dL (ref 8–23)
CO2: 25 mmol/L (ref 22–32)
Calcium: 9.5 mg/dL (ref 8.9–10.3)
Chloride: 101 mmol/L (ref 98–111)
Creatinine: 0.71 mg/dL (ref 0.44–1.00)
GFR, Estimated: >60 mL/min (ref >60)
Glucose, Bld: 102 mg/dL — ABNORMAL HIGH (ref 70–99)
Potassium: 4.7 mmol/L (ref 3.5–5.1)
Sodium: 134 mmol/L — ABNORMAL LOW (ref 135–145)
Total Bilirubin: 0.8 mg/dL (ref 0.0–1.2)
Total Protein: 7.2 g/dL (ref 6.5–8.1)

## 2023-11-27 LAB — CBC WITH DIFFERENTIAL (CANCER CENTER ONLY)
Abs Immature Granulocytes: 0.03 K/uL (ref 0.00–0.07)
Basophils Absolute: 0 K/uL (ref 0.0–0.1)
Basophils Relative: 0 %
Eosinophils Absolute: 0.3 K/uL (ref 0.0–0.5)
Eosinophils Relative: 4 %
HCT: 39.3 % (ref 36.0–46.0)
Hemoglobin: 13 g/dL (ref 12.0–15.0)
Immature Granulocytes: 0 %
Lymphocytes Relative: 23 %
Lymphs Abs: 1.9 K/uL (ref 0.7–4.0)
MCH: 28.6 pg (ref 26.0–34.0)
MCHC: 33.1 g/dL (ref 30.0–36.0)
MCV: 86.6 fL (ref 80.0–100.0)
Monocytes Absolute: 0.5 K/uL (ref 0.1–1.0)
Monocytes Relative: 6 %
Neutro Abs: 5.3 K/uL (ref 1.7–7.7)
Neutrophils Relative %: 67 %
Platelet Count: 270 K/uL (ref 150–400)
RBC: 4.54 MIL/uL (ref 3.87–5.11)
RDW: 14.6 % (ref 11.5–15.5)
WBC Count: 7.9 K/uL (ref 4.0–10.5)
nRBC: 0 % (ref 0.0–0.2)

## 2023-11-27 NOTE — Progress Notes (Signed)
 [facility] CONSULT NOTE  Patient Care Team: Tower, Laine LABOR, MD as PCP - General Verdene Gills, RN as Oncology Nurse Navigator Rennie Cindy SAUNDERS, MD as Consulting Physician (Internal Medicine)  CHIEF COMPLAINTS/PURPOSE OF CONSULTATION: lung cancer  #  Oncology History Overview Note  JULY 2023- 1. 10 mm mean diameter nodule in the left upper lobe adjacent to an area of pleural and parenchymal scarring.     SEP 2023- 1. Hypermetabolic spiculated nodular left upper lobe pulmonary lesion is compatible with primary bronchogenic neoplasm. 2. Mildly metabolic nodularity in the left hilum, without discrete adenopathy may reflect early nodal disease involvement. Suggest attention on follow-up imaging. 3. No evidence of distant hypermetabolic metastatic disease. 4. Hypermetabolic nodule in the left lobe of the thyroid , Recommend thyroid  US  and biopsy (ref: J Am Coll Radiol. 2015 Feb;12(2): 143-50). 5. Aortic Atherosclerosis (ICD10-I70.0) and Emphysema (ICD10-J43.9).  # LUNG: Resection  Synchronous Tumors: Not applicable Total Number of Primary Tumors: 1 Procedure: Lobectomy, lung Specimen Laterality: Left Tumor Focality: Unifocal Tumor Site: Upper lobe Tumor Size: 2.6 cm Histologic Type: Squamous cell carcinoma, moderately differentiated Visceral Pleura Invasion: Not identified Direct Invasion of Adjacent Structures: No adjacent structures present Lymphovascular Invasion: Not identified Margins: All margins negative for invasive carcinoma      Closest Margin(s) to Invasive Carcinoma: Bronchovascular margin      Margin(s) Involved by Invasive Carcinoma: Not applicable       Margin Status for Non-Invasive Tumor: Negative Treatment Effect: No known presurgical therapy Regional Lymph Nodes:      Number of Lymph Nodes Involved: 1                           Nodal Sites with Tumor: Level 10      Number of Lymph Nodes Examined: 21                      Nodal Sites  Examined: Levels 4, 5, 7, 9, 10, 11, 12 and 13 Distant Metastasis:      Distant Site(s) Involved: Not applicable Pathologic Stage Classification (pTNM, AJCC 8th Edition): pT1c, pN1 Ancillary Studies: Can be performed upon request Representative Tumor Block: B5 Comment(s): None  # DEC 14th, 2023- Cisplatin -Taxotere  #1; D-2 udenyca  x4 cycles [finished FEB 2024]  # April 15th, 2024- Keytruda  q 3 W  # s/p adjuvant therapy-Keytruda  cycle #7/17- discontinued sec to side effects- severe joint pain/myalgias.    Lung cancer (HCC)  12/05/2021 Initial Diagnosis   Lung cancer (HCC)   12/18/2021 Cancer Staging   Staging form: Lung, AJCC 8th Edition - Pathologic stage from 12/18/2021: Stage IIB (pT1c, pN1, cM0) - Signed by Kerrin Elspeth BROCKS, MD on 12/18/2021 Histopathologic type: Squamous cell carcinoma, NOS   Cancer of upper lobe of left lung (HCC)  01/03/2022 Initial Diagnosis   Cancer of upper lobe of left lung (HCC)   01/03/2022 Cancer Staging   Staging form: Lung, AJCC 8th Edition - Pathologic: Stage IIB (pT1c, pN1, cM0) - Signed by Rennie Cindy SAUNDERS, MD on 01/03/2022   02/13/2022 - 04/18/2022 Chemotherapy   Patient is on Treatment Plan : LUNG Cisplatin  + Docetaxel  q21d     06/16/2022 - 09/30/2022 Chemotherapy   Patient is on Treatment Plan : LUNG NSCLC Pembrolizumab  (200) q21d      HISTORY OF PRESENTING ILLNESS: Ambulating independently.  Alone.   [name] [name] 73 y.o.  female history of smoking with stage II T2 N1 squamous cell  carcinoma left lung cancer currently on surveillance discontinued adjuvant  immunotherapy/keytruda  because of poor tolerance/ and review the results of CT chest.   Patient s/p evaluation with rheumatology.  Currently on prednisone  2 mg-day-/Cymbalta  and also Lyrica  for the body aches.  Mild neuropathy in feet.   Noted to have weight gain.  Denies any worsening skin rash. Bowels are normal. Appetite is good. Sleeps well.  Review of Systems   Constitutional:  Negative for chills, diaphoresis, fever, malaise/fatigue and weight loss.  HENT:  Negative for nosebleeds and sore throat.   Eyes:  Negative for double vision.  Respiratory:  Negative for cough, hemoptysis, sputum production, shortness of breath and wheezing.   Cardiovascular:  Negative for chest pain, palpitations, orthopnea and leg swelling.  Gastrointestinal:  Negative for abdominal pain, blood in stool, constipation, diarrhea, heartburn, melena, nausea and vomiting.  Genitourinary:  Negative for dysuria, frequency and urgency.  Musculoskeletal:  Positive for myalgias and neck pain. Negative for back pain and joint pain.  Skin: Negative.  Negative for itching and rash.  Neurological:  Positive for tingling. Negative for dizziness, focal weakness, weakness and headaches.  Endo/Heme/Allergies:  Does not bruise/bleed easily.  Psychiatric/Behavioral:  Negative for depression. The patient is not nervous/anxious and does not have insomnia.      MEDICAL HISTORY:  Past Medical History:  Diagnosis Date   Arthritis    OA   Carpal tunnel syndrome    Depression    Endometriosis    Fibromyalgia    Hyperlipidemia    Left upper lobe pulmonary nodule 11/2021   Post herpetic neuralgia    Sleep disorder    Tobacco abuse     SURGICAL HISTORY: Past Surgical History:  Procedure Laterality Date   ABDOMINAL HYSTERECTOMY  1992   total endometriosis   APPENDECTOMY  1992   bladder tack  1992   CATARACT EXTRACTION W/PHACO Right 03/25/2018   Procedure: CATARACT EXTRACTION PHACO AND INTRAOCULAR LENS PLACEMENT (IOC) RIGHT;  Surgeon: Ferol Rogue, MD;  Location: [facility] ORS;  Service: Ophthalmology;  Laterality: Right;  US   01:11 CDE 8.02 Fluid pack lot # [id number]   CATARACT EXTRACTION W/PHACO Left 04/22/2018   Procedure: CATARACT EXTRACTION PHACO AND INTRAOCULAR LENS PLACEMENT (IOC) Left Eye;  Surgeon: Ferol Rogue, MD;  Location: [facility] ORS;  Service: Ophthalmology;  Laterality: Left;   US   01:02 CDE 8.11 Fluid pack lot # [id number]   COLONOSCOPY     FOOT SURGERY Right 04/2016   [facility] in [place] great toe; straightening others   INTERCOSTAL NERVE BLOCK Left 12/12/2021   Procedure: INTERCOSTAL NERVE BLOCK;  Surgeon: Kerrin Elspeth BROCKS, MD;  [facility];  Service: Thoracic;  Laterality: Left;   IR IMAGING GUIDED PORT INSERTION  02/27/2022   IR REMOVAL TUN ACCESS W/ PORT W/O FL MOD SED  11/27/2022   LYMPH NODE DISSECTION Left 12/12/2021   Procedure: LYMPH NODE DISSECTION;  Surgeon: Kerrin Elspeth BROCKS, MD;  Location: [facility] OR;  Service: Thoracic;  Laterality: Left;   VIDEO BRONCHOSCOPY N/A 12/16/2021   Procedure: VIDEO BRONCHOSCOPY;  Surgeon: Kerrin Elspeth BROCKS, MD;  Location: [facility] OR;  Service: Thoracic;  Laterality: N/A;    SOCIAL HISTORY: Social History   Socioeconomic History   Marital status: Widowed    Spouse name: Not on file   Number of children: 2   Years of education: Not on file   Highest education level: Not on file  Occupational History   Not on file  Tobacco Use   Smoking status:  Some Days    Current packs/day: 0.50    Average packs/day: 1 pack/day for 56.0 years (55.5 ttl pk-yrs)    Types: Cigarettes    Start date: 12/04/1966    Last attempt to quit: 12/03/2021   Smokeless tobacco: Never   Tobacco comments:    10 cigarettes a day khj 01/27/2023  Vaping Use   Vaping status: Some Days  Substance and Sexual Activity   Alcohol use: No    Alcohol/week: 0.0 standard drinks of alcohol   Drug use: No   Sexual activity: Not Currently  Other Topics Concern   Not on file  Social History Narrative   Lives alone   Social Drivers of Health   Financial Resource Strain: Low Risk  (04/21/2023)   Overall Financial Resource Strain (CARDIA)    Difficulty of Paying Living Expenses: Not hard at all  Food Insecurity: No Food Insecurity (04/21/2023)   Hunger Vital Sign    Worried About Running Out of Food in the Last Year: Never true     Ran Out of Food in the Last Year: Never true  Transportation Needs: No Transportation Needs (04/21/2023)   PRAPARE - Administrator, Civil Service (Medical): No    Lack of Transportation (Non-Medical): No  Physical Activity: Inactive (04/21/2023)   Exercise Vital Sign    Days of Exercise per Week: 0 days    Minutes of Exercise per Session: 0 min  Stress: No Stress Concern Present (04/21/2023)   [employer] of Occupational Health - Occupational Stress Questionnaire    Feeling of Stress : Not at all  Social Connections: Moderately Isolated (04/21/2023)   Social Connection and Isolation Panel    Frequency of Communication with Friends and Family: More than three times a week    Frequency of Social Gatherings with Friends and Family: More than three times a week    Attends Religious Services: More than 4 times per year    Active Member of [employer] or Organizations: No    Attends Banker Meetings: Never    Marital Status: Widowed  Intimate Partner Violence: Not At Risk (04/21/2023)   Humiliation, Afraid, Rape, and Kick questionnaire    Fear of Current or Ex-Partner: No    Emotionally Abused: No    Physically Abused: No    Sexually Abused: No    FAMILY HISTORY: Family History  Problem Relation Age of Onset   Heart disease Mother        MI   Heart disease Father        MI   Arthritis Sister        crippling arthritis   Arthritis Brother        crippling arthritis    ALLERGIES:  has no known allergies.  MEDICATIONS:  Current Outpatient Medications  Medication Sig Dispense Refill   acetaminophen  (TYLENOL ) 500 MG tablet Take 500 mg by mouth every 6 (six) hours as needed.     busPIRone  (BUSPAR ) 15 MG tablet TAKE 0.5 TABLETS (7.5 MG TOTAL) BY MOUTH TWO TIMES DAILY. 90 tablet 2   docusate sodium  (COLACE) 100 MG capsule Take 200 mg by mouth daily.     DULoxetine  (CYMBALTA ) 60 MG capsule TAKE ONE CAPSULE BY MOUTH ONCE DAILY 90 capsule 2   hydrOXYzine  (ATARAX )  10 MG tablet Take 1 tablet (10 mg total) by mouth 2 (two) times daily as needed for itching (May cause drowsiness). 90 tablet 1   MAGNESIUM  CITRATE PO Take by mouth daily.  Melatonin 10 MG CAPS Take 20 mg by mouth at bedtime.     naproxen sodium (ALEVE) 220 MG tablet Take 440 mg by mouth daily as needed (headaches).     ondansetron  (ZOFRAN ) 8 MG tablet One pill every 8 hours as needed for nausea/vomitting. 40 tablet 1   predniSONE  (DELTASONE ) 1 MG tablet Take 1 mg by mouth in the morning and at bedtime.     pregabalin  (LYRICA ) 75 MG capsule TAKE ONE CAPSULE BY MOUTH TWICE DAILY 180 capsule 1   prochlorperazine  (COMPAZINE ) 10 MG tablet Take 1 tablet (10 mg total) by mouth every 6 (six) hours as needed for nausea or vomiting. 40 tablet 1   senna (SENOKOT) 8.6 MG tablet Take 1 tablet (8.6 mg total) by mouth 2 (two) times daily as needed for constipation. 14 tablet 0   traMADol  (ULTRAM ) 50 MG tablet Take 1 tablet (50 mg total) by mouth 3 (three) times daily as needed for severe pain (pain score 7-10). 90 tablet 0   No current facility-administered medications for this visit.   Facility-Administered Medications Ordered in Other Visits  Medication Dose Route Frequency Provider Last Rate Last Admin   0.9 %  sodium chloride  infusion   Intravenous Continuous Tonette Lauraine HERO, PA-C   Stopped at 02/17/22 1214   heparin  lock flush 100 UNIT/ML injection            heparin  lock flush 100 UNIT/ML injection            sodium chloride  flush (NS) 0.9 % injection 10 mL  10 mL Intravenous Once Dasie Tinnie MATSU, NP          .  PHYSICAL EXAMINATION: ECOG PERFORMANCE STATUS: 0 - Asymptomatic  Vitals:   11/27/23 1252  BP: 137/80  Pulse: 76  Resp: 16  Temp: (!) 96.5 F (35.8 C)  SpO2: 97%    Filed Weights   11/27/23 1252  Weight: 197 lb 1.6 oz (89.4 kg)     Physical Exam Vitals and nursing note reviewed.  Constitutional:      Comments:     HENT:     Head: Normocephalic and atraumatic.      Mouth/Throat:     Mouth: Mucous membranes are moist.     Pharynx: No oropharyngeal exudate.  Eyes:     Pupils: Pupils are equal, round, and reactive to light.  Cardiovascular:     Rate and Rhythm: Normal rate and regular rhythm.  Pulmonary:     Effort: No respiratory distress.     Breath sounds: No wheezing.     Comments: Decreased breath sounds bilaterally at bases.  No wheeze or crackles Abdominal:     General: Bowel sounds are normal. There is no distension.     Palpations: Abdomen is soft. There is no mass.     Tenderness: There is no abdominal tenderness. There is no guarding or rebound.  Musculoskeletal:        General: No tenderness. Normal range of motion.     Cervical back: Normal range of motion and neck supple.  Skin:    General: Skin is warm.  Neurological:     Mental Status: She is alert and oriented to person, place, and time.  Psychiatric:        Mood and Affect: Affect normal.        Judgment: Judgment normal.      LABORATORY DATA:  I have reviewed the data as listed Lab Results  Component Value Date   WBC  7.9 11/27/2023   HGB 13.0 11/27/2023   HCT 39.3 11/27/2023   MCV 86.6 11/27/2023   PLT 270 11/27/2023   Recent Labs    05/21/23 1055 05/27/23 0915 11/27/23 1248  NA 137 134* 134*  K 3.9 3.9 4.7  CL 100 101 101  CO2 30 21* 25  GLUCOSE 125* 195* 102*  BUN 16 22 11   CREATININE 0.82 0.87 0.71  CALCIUM  9.9 9.7 9.5  GFRNONAA  --  >60 >60  PROT 6.9 6.9 7.2  ALBUMIN  4.0 3.8 3.8  AST 12 19 19   ALT 10 12 16   ALKPHOS 68 64 83  BILITOT 0.5 0.6 0.8    RADIOGRAPHIC STUDIES: I have personally reviewed the radiological images as listed and agreed with the findings in the report. CT CHEST W CONTRAST Result Date: 11/11/2023 CLINICAL DATA:  Follow-up of non-small-cell lung cancer diagnosed in 2023. Left-sided resection. Chemotherapy in 2024. * Tracking Code: BO * EXAM: CT CHEST WITH CONTRAST TECHNIQUE: Multidetector CT imaging of the chest was  performed during intravenous contrast administration. RADIATION DOSE REDUCTION: This exam was performed according to the departmental dose-optimization program which includes automated exposure control, adjustment of the mA and/or kV according to patient size and/or use of iterative reconstruction technique. CONTRAST:  75mL ISOVUE -300 IOPAMIDOL  (ISOVUE -300) INJECTION 61% COMPARISON:  05/20/2023 FINDINGS: Cardiovascular: Aortic atherosclerosis. Transverse aortic focal outpouching, likely a penetrating ulcer is similar at maximally 1.2 cm on 36/2. Normal heart size, without pericardial effusion. Lad coronary artery calcification. Mediastinum/Nodes: No mediastinal or hilar adenopathy. Lungs/Pleura: Tiny left pleural effusion is unchanged. Mild centrilobular emphysema. Left upper lobectomy.  Minimal motion degradation. 2 mm posterior right upper lobe pulmonary nodule is unchanged on 37/3. Smoking related respiratory bronchiolitis again identified. Upper Abdomen: Normal imaged portions of the liver, spleen, stomach, pancreas, gallbladder, adrenal glands, right kidney. Left renal cortical scarring. Musculoskeletal: No acute osseous abnormality. Moderate left hemidiaphragm elevation. IMPRESSION: 1. Status post left upper lobectomy, without recurrent or metastatic disease. 2. Similar tiny left pleural effusion. 3. Aortic atherosclerosis (ICD10-I70.0), coronary artery atherosclerosis and emphysema (ICD10-J43.9). Electronically Signed   By: Rockey Kilts M.D.   On: 11/11/2023 10:24    ASSESSMENT & PLAN:   Cancer of upper lobe of left lung (HCC) # 2023-JULY  Left upper lobe-lung cancer moderately differentiated squamous cell carcinoma-pT1pN1- [IIB]; PDL-1 =70%. EGFR status-WILD.  Adjuvant chemotherapy  s/p 4 cycles of Cis-Taxotere -CT scan; DISCONTINUED KEYTRUDA  after #7 of planned 17 [discontinued sec to side effects- severe joint pain/myalgias] .   # SEP 2025- CT chest-Status post left upper lobectomy, without  recurrent or metastatic disease; Similar tiny left pleural effusion. would continue follow up with scan in 6 months. STABLE>   # Fibromyalgia- currently prednisone  -? dose; lyrica ; cymbalta ;  [Dr.[place]kes[place]]- defer to Rheumatology-  stable.  # Interim data elevated TSH with normal T4-awaiting thyroid  profile from today.  If significantly abnormal would recommend further follow-up with PCP regarding thyroid  supplementation.  # PN G-1 ; from Taxotere - stable.  # # IV Access: port Explantation-   # Vaccination: Flu shot- recommended.   pt requesting AM appts-   Staff call re: Thyroid  # DISPOSITION: # follow up in 6  month- MD;  labs- cbc/cmp;Thyroid  profile CT chest prior-[facility]- Dr.B  # I reviewed the blood work- with the patient in detail; also reviewed the imaging independently [as summarized above]; and with the patient in detail.    All questions were answered. The patient knows to call the clinic with any problems, questions or concerns.  Cindy JONELLE Joe, MD 11/27/2023 1:55 PM

## 2023-11-27 NOTE — Progress Notes (Signed)
 CT chest 11/11/23.

## 2023-11-27 NOTE — Assessment & Plan Note (Addendum)
#   2023-JULY  Left upper lobe-lung cancer moderately differentiated squamous cell carcinoma-pT1pN1- [IIB]; PDL-1 =70%. EGFR status-WILD.  Adjuvant chemotherapy  s/p 4 cycles of Cis-Taxotere -CT scan; DISCONTINUED KEYTRUDA  after #7 of planned 17 [discontinued sec to side effects- severe joint pain/myalgias] .   # SEP 2025- CT chest-Status post left upper lobectomy, without recurrent or metastatic disease; Similar tiny left pleural effusion. would continue follow up with scan in 6 months. STABLE>   # Fibromyalgia- currently prednisone  -? dose; lyrica ; cymbalta ;  [Dr.Hawkes[place]]- defer to Rheumatology-  stable.  # Interim data elevated TSH with normal T4-awaiting thyroid  profile from today.  If significantly abnormal would recommend further follow-up with PCP regarding thyroid  supplementation.  # PN G-1 ; from Taxotere - stable.  # # IV Access: port Explantation-   # Vaccination: Flu shot- recommended.   pt requesting AM appts-   Staff call re: Thyroid  # DISPOSITION: # follow up in 6  month- MD;  labs- cbc/cmp;Thyroid  profile CT chest prior-[facility]- Dr.B  # I reviewed the blood work- with the patient in detail; also reviewed the imaging independently [as summarized above]; and with the patient in detail.

## 2023-11-28 LAB — THYROID PANEL WITH TSH
Free Thyroxine Index: 1.5 (ref 1.2–4.9)
T3 Uptake Ratio: 21 % — ABNORMAL LOW (ref 24–39)
T4, Total: 7.3 ug/dL (ref 4.5–12.0)
TSH: 5.01 u[IU]/mL — ABNORMAL HIGH (ref 0.450–4.500)

## 2023-11-30 ENCOUNTER — Ambulatory Visit: Payer: Self-pay | Admitting: Internal Medicine

## 2023-12-02 ENCOUNTER — Encounter: Payer: Self-pay | Admitting: Internal Medicine

## 2024-01-25 ENCOUNTER — Other Ambulatory Visit: Payer: Self-pay | Admitting: Family Medicine

## 2024-01-26 NOTE — Telephone Encounter (Signed)
 Last filled on 01/28/23 #180 tab/ 1 refill  CPE was on 05/21/23

## 2024-02-17 ENCOUNTER — Other Ambulatory Visit: Payer: Self-pay | Admitting: Family Medicine

## 2024-02-18 NOTE — Telephone Encounter (Signed)
 Name of Medication: Tramadol  Name of [facility]: [facility] or Written Date and Quantity: 10/27/23 #90 tabs/ 0 refills Last Office Visit and Type: CPE 05/21/23 Next Office Visit and Type: none scheduled

## 2024-03-18 ENCOUNTER — Ambulatory Visit: Payer: Self-pay

## 2024-03-18 ENCOUNTER — Encounter: Payer: Self-pay | Admitting: *Deleted

## 2024-03-18 NOTE — Telephone Encounter (Signed)
" °  FYI Only or Action Required?: Action required by provider: request for appointment and clinical question for provider.  Patient was last seen in primary care on 05/21/2023 by Randeen Laine LABOR, MD.  Called Nurse Triage reporting Arthritis.  Symptoms began a week ago.  Interventions attempted: Prescription medications: tramadol , Cymbalta , lyrica .  Symptoms are: unchanged.  Triage Disposition: See Physician Within 24 Hours  Patient/caregiver understands and will follow disposition?: No, wishes to speak with PCP    Message from [facility] sent at 03/18/2024  1:13 PM EST  Summary: Pain all over body   Reason for Triage: Pain all over body, doesnt know if arthiritis is kicking in. Severe pain when walking and sitting.         Reason for Disposition  [1] MODERATE pain (e.g., interferes with normal activities) AND [2] present > 3 days  Answer Assessment - Initial Assessment Questions No available appts with pcp/clinic, declines alt prov.   Advised UC today and ED/911 if symptoms worsen. Patinet verbalized understanding.  Patient requesting appt next week with Dr. Lucille Solian.    1. ONSET: When did the pain start?     2 weeks 2. LOCATION: Where is the pain located?     generalized 3. PAIN: How bad is the pain? (Scale 1-10; or mild, moderate, severe)     8/10; meds taking for fibramyalgia not effective 4. WORK OR EXERCISE: Has there been any recent work or exercise that involved this part of the body?     No injuries 5. CAUSE: What do you think is causing the shoulder pain?     arthritis 6. OTHER SYMPTOMS: Do you have any other symptoms? (e.g., neck pain, swelling, rash, fever, numbness, weakness)  Denies diff breathing, chest pain(jaw, neck arm, upper back), faint, fever chills n/v/d, redness, swelling  Protocols used: Shoulder Pain-A-AH  "

## 2024-03-18 NOTE — Telephone Encounter (Signed)
 Per pt request she wants to see PCP next week but given disposition to see PCP within 24 hrs they have to rout to PCP for approval. Please see triage note below

## 2024-03-18 NOTE — Telephone Encounter (Signed)
 I am fine with appointment next week  Agree with ER/UC precautions in meantime

## 2024-03-18 NOTE — Telephone Encounter (Signed)
 This encounter was created in error - please disregard.

## 2024-03-18 NOTE — Telephone Encounter (Signed)
 Patient called back to schedule appt next week with PCP. Patient reports moderate pain and reports if worsens she will go to [facility] or ED. Scheduled appt 03/23/24.

## 2024-03-23 ENCOUNTER — Ambulatory Visit: Admitting: Family Medicine

## 2024-04-21 ENCOUNTER — Ambulatory Visit: Payer: Medicare HMO

## 2024-04-22 ENCOUNTER — Ambulatory Visit

## 2024-05-25 ENCOUNTER — Other Ambulatory Visit

## 2024-06-08 ENCOUNTER — Ambulatory Visit: Admitting: Internal Medicine

## 2024-06-08 ENCOUNTER — Other Ambulatory Visit
